# Patient Record
Sex: Female | Born: 1965 | Race: White | Hispanic: No | Smoking: Never smoker
Health system: Southern US, Community
[De-identification: ages and names within clinical notes are randomized; demographics above are authoritative.]

## PROBLEM LIST (undated history)

## (undated) DIAGNOSIS — Z803 Family history of malignant neoplasm of breast: Secondary | ICD-10-CM

## (undated) DIAGNOSIS — D649 Anemia, unspecified: Secondary | ICD-10-CM

## (undated) DIAGNOSIS — Z889 Allergy status to unspecified drugs, medicaments and biological substances status: Secondary | ICD-10-CM

## (undated) DIAGNOSIS — R55 Syncope and collapse: Secondary | ICD-10-CM

## (undated) DIAGNOSIS — K5900 Constipation, unspecified: Secondary | ICD-10-CM

## (undated) DIAGNOSIS — T753XXA Motion sickness, initial encounter: Secondary | ICD-10-CM

## (undated) DIAGNOSIS — J45909 Unspecified asthma, uncomplicated: Secondary | ICD-10-CM

## (undated) DIAGNOSIS — R12 Heartburn: Secondary | ICD-10-CM

## (undated) DIAGNOSIS — T8859XA Other complications of anesthesia, initial encounter: Secondary | ICD-10-CM

## (undated) DIAGNOSIS — R600 Localized edema: Secondary | ICD-10-CM

## (undated) DIAGNOSIS — C20 Malignant neoplasm of rectum: Secondary | ICD-10-CM

## (undated) DIAGNOSIS — T4145XA Adverse effect of unspecified anesthetic, initial encounter: Secondary | ICD-10-CM

## (undated) DIAGNOSIS — M1990 Unspecified osteoarthritis, unspecified site: Secondary | ICD-10-CM

## (undated) DIAGNOSIS — N949 Unspecified condition associated with female genital organs and menstrual cycle: Secondary | ICD-10-CM

## (undated) DIAGNOSIS — M549 Dorsalgia, unspecified: Secondary | ICD-10-CM

## (undated) DIAGNOSIS — R198 Other specified symptoms and signs involving the digestive system and abdomen: Secondary | ICD-10-CM

## (undated) DIAGNOSIS — M2550 Pain in unspecified joint: Secondary | ICD-10-CM

## (undated) DIAGNOSIS — R112 Nausea with vomiting, unspecified: Secondary | ICD-10-CM

## (undated) DIAGNOSIS — C189 Malignant neoplasm of colon, unspecified: Secondary | ICD-10-CM

## (undated) DIAGNOSIS — Z8049 Family history of malignant neoplasm of other genital organs: Secondary | ICD-10-CM

## (undated) DIAGNOSIS — R1310 Dysphagia, unspecified: Secondary | ICD-10-CM

## (undated) DIAGNOSIS — Z9889 Other specified postprocedural states: Secondary | ICD-10-CM

## (undated) DIAGNOSIS — R0602 Shortness of breath: Secondary | ICD-10-CM

## (undated) HISTORY — PX: TUBAL LIGATION: SHX77

## (undated) HISTORY — DX: Dysphagia, unspecified: R13.10

## (undated) HISTORY — PX: DILATION AND CURETTAGE OF UTERUS: SHX78

## (undated) HISTORY — PX: DIAGNOSTIC LAPAROSCOPY: SUR761

## (undated) HISTORY — PX: ABDOMINAL HYSTERECTOMY: SHX81

## (undated) HISTORY — DX: Localized edema: R60.0

## (undated) HISTORY — DX: Constipation, unspecified: K59.00

## (undated) HISTORY — DX: Malignant neoplasm of rectum: C20

## (undated) HISTORY — DX: Family history of malignant neoplasm of other genital organs: Z80.49

## (undated) HISTORY — DX: Shortness of breath: R06.02

## (undated) HISTORY — DX: Pain in unspecified joint: M25.50

## (undated) HISTORY — DX: Dorsalgia, unspecified: M54.9

## (undated) HISTORY — PX: COLON SURGERY: SHX602

## (undated) HISTORY — DX: Unspecified asthma, uncomplicated: J45.909

## (undated) HISTORY — DX: Malignant neoplasm of colon, unspecified: C18.9

## (undated) HISTORY — DX: Family history of malignant neoplasm of breast: Z80.3

---

## 1998-11-06 ENCOUNTER — Other Ambulatory Visit: Admission: RE | Admit: 1998-11-06 | Discharge: 1998-11-06 | Payer: Self-pay | Admitting: Obstetrics & Gynecology

## 1999-07-03 ENCOUNTER — Ambulatory Visit (HOSPITAL_COMMUNITY): Admission: RE | Admit: 1999-07-03 | Discharge: 1999-07-03 | Payer: Self-pay | Admitting: Obstetrics & Gynecology

## 1999-07-03 ENCOUNTER — Encounter (INDEPENDENT_AMBULATORY_CARE_PROVIDER_SITE_OTHER): Payer: Self-pay

## 1999-08-31 ENCOUNTER — Other Ambulatory Visit: Admission: RE | Admit: 1999-08-31 | Discharge: 1999-08-31 | Payer: Self-pay | Admitting: Obstetrics & Gynecology

## 1999-10-12 ENCOUNTER — Other Ambulatory Visit: Admission: RE | Admit: 1999-10-12 | Discharge: 1999-10-12 | Payer: Self-pay | Admitting: Obstetrics & Gynecology

## 1999-10-21 ENCOUNTER — Encounter (INDEPENDENT_AMBULATORY_CARE_PROVIDER_SITE_OTHER): Payer: Self-pay

## 1999-10-21 ENCOUNTER — Other Ambulatory Visit: Admission: RE | Admit: 1999-10-21 | Discharge: 1999-10-21 | Payer: Self-pay | Admitting: Obstetrics & Gynecology

## 2000-03-30 ENCOUNTER — Other Ambulatory Visit: Admission: RE | Admit: 2000-03-30 | Discharge: 2000-03-30 | Payer: Self-pay | Admitting: Obstetrics & Gynecology

## 2000-12-25 ENCOUNTER — Other Ambulatory Visit: Admission: RE | Admit: 2000-12-25 | Discharge: 2000-12-25 | Payer: Self-pay | Admitting: Obstetrics & Gynecology

## 2001-01-24 ENCOUNTER — Inpatient Hospital Stay (HOSPITAL_COMMUNITY): Admission: RE | Admit: 2001-01-24 | Discharge: 2001-01-26 | Payer: Self-pay | Admitting: Obstetrics & Gynecology

## 2001-01-24 ENCOUNTER — Encounter (INDEPENDENT_AMBULATORY_CARE_PROVIDER_SITE_OTHER): Payer: Self-pay | Admitting: Specialist

## 2002-08-16 ENCOUNTER — Other Ambulatory Visit: Admission: RE | Admit: 2002-08-16 | Discharge: 2002-08-16 | Payer: Self-pay | Admitting: Obstetrics & Gynecology

## 2003-09-08 ENCOUNTER — Other Ambulatory Visit: Admission: RE | Admit: 2003-09-08 | Discharge: 2003-09-08 | Payer: Self-pay | Admitting: Obstetrics & Gynecology

## 2004-10-29 ENCOUNTER — Other Ambulatory Visit: Admission: RE | Admit: 2004-10-29 | Discharge: 2004-10-29 | Payer: Self-pay | Admitting: Obstetrics & Gynecology

## 2006-05-24 ENCOUNTER — Encounter: Admission: RE | Admit: 2006-05-24 | Discharge: 2006-05-24 | Payer: Self-pay | Admitting: Internal Medicine

## 2007-01-28 ENCOUNTER — Emergency Department (HOSPITAL_COMMUNITY): Admission: EM | Admit: 2007-01-28 | Discharge: 2007-01-28 | Payer: Self-pay | Admitting: *Deleted

## 2010-08-24 ENCOUNTER — Encounter
Admission: RE | Admit: 2010-08-24 | Discharge: 2010-08-24 | Payer: Self-pay | Source: Home / Self Care | Attending: Geriatric Medicine | Admitting: Geriatric Medicine

## 2010-12-31 NOTE — Op Note (Signed)
[facility]  Patient:    Kaitlyn Morgan, Kaitlyn Morgan                     MRN: [id number] Proc. Date: 01/24/01 Adm. Date:  [id number] Attending:  Minette Headland                           Operative Report  PREOPERATIVE DIAGNOSIS:   Adenocarcinoma in situ of the endocervix.  POSTOPERATIVE DIAGNOSIS:  Adenocarcinoma in situ of the endocervix.  OPERATION:  Total vaginal hysterectomy.  SURGEON:  Christy B. Jennette Kettle, M.D.  ASSISTANT:  Marcelle Overlie, M.D.  ANESTHESIA:  General endotracheal.  ESTIMATED BLOOD LOSS:   100 cc.  INTRAOPERATIVE COMPLICATIONS:  None.  INDICATIONS:  The patient was admitted on the morning of surgery.  She was given 1 g of Cefotan IV.  She was placed in PAS hose.  She was brought to the operating room and placed under adequate general endotracheal anesthesia and placed in the dorsal lithotomy position using the Rowena stirrups system. Betadine prep of the mons, perineum and vagina was carried out in the usual fashion.  Sterile drapes were applied.  A posterior weighted vaginal retractor was placed.  Then the retractor was used to retract the vaginal walls and laterally.  The cervix was visualized and grasped with a Jacobs tenaculum. Colpotomy incision was made while tenting the mucosa of the vagina posterior to the cervix.  The cervix was circumscribed with a scalpel.  Curved Heaneys were placed on the uterosacrals which were divided sharply and ligated with 0 Monocryl in a Heaney fashion.  The cervix was circumscribed with the scalpel.  Curved Heaneys were placed on the uterosacrals which were divided sharply and ligated with 0 Monocryl in a Heaney fashion.  The bladder was advanced off of the cervix and lower uterine segment and the peritoneum anteriorly entered.  Cardinal ligament pedicles, vessel pedicles and one additional pedicle above the vessels on each side were then developed sequentially.  Each was divided and ligated with  suture ligature of 0 Monocryl.  The uterus was delivered through the vaginal introitus.  Curved Heaneys were placed on the utero-ovarian pedicles on each side and the tuerus removed.  The Filshie clips were then applied to the tubes and were also removed.  Each of these pedicles was doubly ligated with a free tie of 0 Monocryl followed by a suture ligature of 0 Monocryl.  Hemostasis was noted to be adequate.  Uterosacrals were anchored to the vaginal mucosa with a mattress suture of 0 Monocryl.  The uterosacrals were plicated and the posterior peritoneum closed with interrupted 0 Monocryl.  Cuff was closed vertically with figure-of-eight of 0 Monocryl.  Foley catheter was placed. Clear urine was obtained.  The procedure was terminated.  The patient was taken to recovery in good condition. DD:  01/24/01 TD:  01/24/01 Job: [id number]

## 2010-12-31 NOTE — Op Note (Signed)
[facility]  Patient:    Kaitlyn Morgan                      MRN: [id number] Proc. Date: 09/19/96 Adm. Date:  09/19/96 Attending:  Minette Headland                           Operative Report  PREOPERATIVE DIAGNOSIS:       Wound separation.  POSTOPERATIVE DIAGNOSIS:      Wound separation.  OPERATIVE PROCEDURE:          Secondary closure of lower abdominal transverse incision.  SURGEON:                      Freddy Finner, M.D.  ANESTHESIA:                   General.  INTRAOPERATIVE COMPLICATIONS: None.  INDICATIONS:                  The patient is a 45 year old who was delivered by cesarean section on September 09, 1995 by Trevor Iha, M.D.  Postoperatively, she had a wound seroma which subsequently separated and has been treated with twice-daily wound care by the Home Health nurse.  She was seen in the office on the day prior to this procedure and at that time, the wound was very clean.  The patient was having a lot of difficulty tolerating her wound care.  It was not felt that she could tolerate a surgical procedure in the office for suturing and, therefore, she was scheduled today for that procedure.  DESCRIPTION OF PROCEDURE:     She was admitted on the morning of surgery.  She was brought to the operating room, placed under adequate general anesthesia and placed in a dorsal recumbent position.  The lower abdomen was prepped in the usual fashion with scrub followed by solution.  The packing was removed from the wound and the skin staples were removed which were remaining as well as the Steri-Strips.  Sterile drapes were applied.  Using a #1 Prolene suture material, a total of approximately six mattress sutures were placed to approximate the wound both superficially and at a deeper level.  The fascia was intact.                                After completing the suturing of the wound, the patient was awakened and taken to  recovery in good condition and will be discharged for followup in the office in approximately three days.                                She is to take Ceftin 250 mg b.i.d. She was given a gram of Ceftin preoperatively.  She is to call for fever, for heavy drainage from the wound or for heavy bleeding from the wound. DD:  09/19/96 TD:  09/19/96 Job: [id number]

## 2010-12-31 NOTE — Discharge Summary (Signed)
[facility]  Patient:    Kaitlyn Morgan, Kaitlyn Morgan                     MRN: [id number] Adm. Date:  [id number] Disc. Date: 01/26/01 Attending:  Minette Headland                           Discharge Summary  DISCHARGE DIAGNOSIS:  Adenocarcinoma in situ of the endocervix.  PROCEDURE:  Total vaginal hysterectomy.  POSTOPERATIVE COMPLICATIONS:  Mid chest pain probably secondary to GERD picture or air within the abdomen secondary to surgery or anxiety.  No other complications.  CONDITION ON DISCHARGE:  The patient was in satisfactory improved condition at the time of her discharge.  ACTIVITY:  She was to aggressively increase physical activity.  Avoid vaginal entry or heavy lifting.  DISCHARGE MEDICATIONS: 1. Percocet 5 mg as needed for postoperative pain. 2. Xanax 0.5 mg t.i.d. p.r.n.  FOLLOWUP:  Follow up with Dr. Jennette Kettle in approximately two weeks.  SPECIAL INSTRUCTIONS:  She is to call for fever, severe pain or heavy vaginal bleeding.  DIET:  Regular diet with vitamin supplement.  HISTORY OF PRESENT ILLNESS:  The patient had an abnormal Pap smear with close followup over a period of one year that failed to identify the abnormal cells which were being picked up on pathology.  She was admitted this time for vaginal hysterectomy.  LABORATORY DATA AND X-RAY FINDINGS:  Preoperative hemoglobin of 13.1, postoperative hemoglobin 10.1.  HOSPITAL COURSE:  The patient was admitted on the morning of surgery.  The above described procedure was accomplished without difficulty or significant intraoperative complications.  Postoperatively, her only complaint was of difficulty taking a deep breath with pressure and pain in her mid chest on deep inspiration.  Her examination of heart and lungs was completely normal. Her O2 saturation on room air was 98%.  Vital signs remained stable.  She was afebrile.  With Xanax 0.5 mg t.i.d., she did have some improvement.  By  the morning of discharge on postop day #2, her condition was considered to be satisfactory. DD:  01/26/01 TD:  01/26/01 Job: [id number]

## 2010-12-31 NOTE — Op Note (Signed)
[facility]  Patient:    Kaitlyn Morgan                      MRN: [id number] Proc. Date: 07/03/99 Adm. Date:  [id number] Attending:  Minette Headland                           Operative Report  PREOPERATIVE DIAGNOSIS:       Threatened abortion.  Possible ectopic.  Hemorrhagic adnexal cyst.  Multiparity and requests for surgical sterilization.  INDICATIONS:                  The patient is a 45 year old gravida 7 with three living children who presented to the office with some vaginal bleeding and a positive serum pregnancy test in spite of what she thought was a normal period starting on November 15. Quantitative hCG was 954.  There was no identifiable gestation sac within the uterus.  The patient has definitively decided not to have more children and, with the presence of the bleeding and the potential for ectopic, she is now admitted for laparoscopy and D&C.  Intraoperative findings were most consistent with a normal intrauterine pregnancy and missed abortion and a hemorrhagic corpus luteum cyst of the right ovary.  Photographs of intraoperative findings show are retained in the office record.  DESCRIPTION OF PROCEDURE:     The patient was admitted on the morning of surgery, brought to the operating room and placed under adequate general endotracheal anesthesia in the dorsal lithotomy position in the H. Rivera Colen stirrups.  Betadine prep and draping of the peritoneum and vagina was carried out.  The bladder was evacuated with a Robinson catheter.  The cervix was visualized using a bivalved speculum.  The cervix was grasped with a single toothed tenaculum and progressively dilated with Pratts to #25.  General thorough curettage and exploration with polyp forceps was carried out, producing a moderate amount of tissue.  A Hulka tenaculum was then placed into the cervix.  Sterile drapes were applied.  Two small incisions were made in the abdomen, one  just above the symphysis in the midline and one at the umbilicus.   Through the umbilical incision, a 10 mm trocar was introduced while elevating the anterior abdominal wall manually.  Direct inspection revealed adequate placement and no evidence of injury on entry. Pneumoperitoneum was allow to accumulate with carbon dioxide gas. A 5 mm trocar was placed through the lower incision under direct vision and a blunt probe placed through the lower trocar sleeve. Examination of the pelvic contents included what appeared to be completely normal fallopian tubes, a markedly cystic right ovary which was ruptured with the blunt probe, producing serosanguineous material, but no active bleeding.  This was very consistent with a hemorrhagic corpus luteum.  The left ovary was normal.  The appendix could not be visualized.  No apparent abnormalities were noted in the upper abdomen.  Using the Filshie clip applying device, a Filshie clip was applied to the isthmic portion of each fallopian tube without difficulty.  All of the fluid that leaked from the cyst was aspirated, and amounted to approximately 100 cc. Hemostasis was adequate at this point.  Gas was allowed to escape from the abdomen.  Instruments were removed.  The skin incisions were closed with interrupted subcuticular sutures of 0 Dexon. Steri-Strips were applied to the lower incision.  THen, 0.5% Marcaine was injected into the incisions for  postoperative analgesia.  The patient was awakened and taken to recovery in good condition. DD:  07/03/99 TD:  07/04/99 Job: [id number]

## 2011-06-01 LAB — MONONUCLEOSIS SCREEN: Mono Screen: NEGATIVE

## 2011-06-01 LAB — RAPID STREP SCREEN (MED CTR MEBANE ONLY): Streptococcus, Group A Screen (Direct): NEGATIVE

## 2011-11-29 ENCOUNTER — Ambulatory Visit: Payer: 59

## 2011-11-29 ENCOUNTER — Ambulatory Visit (INDEPENDENT_AMBULATORY_CARE_PROVIDER_SITE_OTHER): Payer: 59 | Admitting: Family Medicine

## 2011-11-29 VITALS — BP 106/69 | HR 74 | Temp 99.0°F | Resp 16 | Ht 63.0 in | Wt 164.0 lb

## 2011-11-29 DIAGNOSIS — S91309A Unspecified open wound, unspecified foot, initial encounter: Secondary | ICD-10-CM

## 2011-11-29 DIAGNOSIS — S91339A Puncture wound without foreign body, unspecified foot, initial encounter: Secondary | ICD-10-CM

## 2011-11-29 MED ORDER — CEPHALEXIN 500 MG PO CAPS: 500.0000 mg | ORAL_CAPSULE | Freq: Two times a day (BID) | ORAL | Status: DC

## 2011-11-29 MED ORDER — AMOXICILLIN 500 MG PO CAPS: 500.0000 mg | ORAL_CAPSULE | Freq: Two times a day (BID) | ORAL | Status: AC

## 2011-11-29 NOTE — Progress Notes (Signed)
  Subjective:    Patient ID: Kaitlyn Morgan, female    DOB: 1966/03/13, 46 y.o.   MRN: [id number]  HPI Puncture heel on a  Metal wire rack this morning.  Now having pain with weightbearing.  No paresthesias. It continues to bleed. Her tdap was updated in 2012.  Review of Systems  Constitutional: Negative.   Neurological: Negative.        Objective:   Physical Exam  Constitutional: She is oriented to person, place, and time. She appears well-developed and well-nourished.  Cardiovascular: Intact distal pulses.   Pulmonary/Chest: Effort normal and breath sounds normal.  Musculoskeletal: She exhibits tenderness.       Right plantar aspect of her heel. Nonbleeding 1-2 mm puncture wound with surrounding mild erythema.  Neurological: She is alert and oriented to person, place, and time.  Skin: Skin is warm and dry. There is erythema.  Psychiatric: She has a normal mood and affect. Her behavior is normal.      [facility] reading (PRIMARY) by  Dr. Althea Charon 2 view xray of the right heel  without evidence of a foreign body.     Assessment & Plan:   1. Puncture wound of heel without complication  DG Os Calcis Right, amoxicillin (AMOXIL) 500 MG capsule  Amoxil given for 7 days. See AVS for pt instructions. OTC analgesics for pain control.

## 2011-11-29 NOTE — Patient Instructions (Addendum)
Puncture Wound  A puncture wound is an injury that extends through all layers of the skin and into the tissue beneath the skin (subcutaneous tissue). Puncture wounds become infected easily because germs often enter the body and go beneath the skin during the injury. Having a deep wound with a small entrance point makes it difficult for your caregiver to adequately clean the wound. This is especially true if you have stepped on a nail and it has passed through a dirty shoe or other situations where the wound is obviously contaminated.  CAUSES   Many puncture wounds involve glass, nails, splinters, fish hooks, or other objects that enter the skin (foreign bodies). A puncture wound may also be caused by a human bite or animal bite.  DIAGNOSIS   A puncture wound is usually diagnosed by your history and a physical exam. You may need to have an X-ray or an ultrasound to check for any foreign bodies still in the wound.  TREATMENT    Your caregiver will clean the wound as thoroughly as possible. Depending on the location of the wound, a bandage (dressing) may be applied.   Your caregiver might prescribe antibiotic medicines.   You may need a follow-up visit to check on your wound. Follow all instructions as directed by your caregiver.  HOME CARE INSTRUCTIONS    Change your dressing once per day, or as directed by your caregiver. If the dressing sticks, it may be removed by soaking the area in water.   If your caregiver has given you follow-up instructions, it is very important that you return for a follow-up appointment. Not following up as directed could result in a chronic or permanent injury, pain, and disability.   Only take over-the-counter or prescription medicines for pain, discomfort, or fever as directed by your caregiver.   If you are given antibiotics, take them as directed. Finish them even if you start to feel better.  You may need a tetanus shot if:   You cannot remember when you had your last tetanus  shot.   You have never had a tetanus shot.  If you got a tetanus shot, your arm may swell, get red, and feel warm to the touch. This is common and not a problem. If you need a tetanus shot and you choose not to have one, there is a rare chance of getting tetanus. Sickness from tetanus can be serious.  You may need a rabies shot if an animal bite caused your puncture wound.  SEEK MEDICAL CARE IF:    You have redness, swelling, or increasing pain in the wound.   You have red streaks going away from the wound.   You notice a bad smell coming from the wound or dressing.   You have yellowish-white fluid (pus) coming from the wound.   You are treated with an antibiotic for infection, but the infection is not getting better.   You notice something in the wound, such as rubber from your shoe, cloth, or another object.   You have a fever.   You have severe pain.   You have difficulty breathing.   You feel dizzy or faint.   You cannot stop vomiting.   You lose feeling, develop numbness, or cannot move a limb below the wound.   Your symptoms worsen.  MAKE SURE YOU:   Understand these instructions.   Will watch your condition.   Will get help right away if you are not doing well or get worse.    Document Released: 05/11/2005 Document Revised: 07/21/2011 Document Reviewed: 01/18/2011  [employer] Patient Information 2012 [employer].

## 2011-12-01 ENCOUNTER — Telehealth: Payer: Self-pay

## 2011-12-01 NOTE — Telephone Encounter (Signed)
Please write note for parking. Please check on stomach cramps.  Kaitlyn Morgan

## 2011-12-01 NOTE — Telephone Encounter (Signed)
Please advise on this.  

## 2011-12-01 NOTE — Telephone Encounter (Signed)
Pt was seen in office for heel injury pt needs note faxed to employer stating her injury and her need to park closer to building please fax note to [phone] Attention: Sercurity

## 2011-12-01 NOTE — Telephone Encounter (Signed)
Patient states she was given samples of amoxicillin and she is having severe stomach pains. States she already has a sensitive stomach and can eat limited foods. Please call patient to let her know what she should do.

## 2011-12-02 NOTE — Telephone Encounter (Signed)
Patient states that the antibiotic was hurting her stomach and wanted to know if she could just stop taking it.  Also stated that she didn't need the note anymore.

## 2011-12-03 NOTE — Telephone Encounter (Signed)
Spoke pt advised message from [place]. Pt understood

## 2011-12-03 NOTE — Telephone Encounter (Signed)
LMOM to CB. 

## 2011-12-03 NOTE — Telephone Encounter (Signed)
The antibiotic was prescribed because puncture wounds are notorious for developing infection.  If he is not taking the Amoxicillin with food, please advise him to.  If the GI upset is mild/tolerable, advise he continue it.  If he is taking it with food, and it is intolerable, he may D/C it, but should RTC at the first indication of infection: increasing pain, redness, swelling, recurrent bleeding, drainage from the wound, fever.

## 2013-06-26 ENCOUNTER — Other Ambulatory Visit: Payer: Self-pay | Admitting: Gastroenterology

## 2013-06-26 DIAGNOSIS — K5900 Constipation, unspecified: Secondary | ICD-10-CM

## 2013-06-26 DIAGNOSIS — R141 Gas pain: Secondary | ICD-10-CM

## 2013-07-09 ENCOUNTER — Ambulatory Visit
Admission: RE | Admit: 2013-07-09 | Discharge: 2013-07-09 | Disposition: A | Discharge status: Home / Self Care | Payer: 59 | Source: Ambulatory Visit | Attending: Gastroenterology | Admitting: Gastroenterology

## 2013-07-09 DIAGNOSIS — R141 Gas pain: Secondary | ICD-10-CM

## 2013-07-09 DIAGNOSIS — K5900 Constipation, unspecified: Secondary | ICD-10-CM

## 2015-07-04 ENCOUNTER — Ambulatory Visit (INDEPENDENT_AMBULATORY_CARE_PROVIDER_SITE_OTHER): Payer: 59 | Admitting: Internal Medicine

## 2015-07-04 VITALS — BP 118/80 | HR 70 | Temp 98.5°F | Resp 16 | Ht 62.0 in | Wt 165.6 lb

## 2015-07-04 DIAGNOSIS — J302 Other seasonal allergic rhinitis: Secondary | ICD-10-CM | POA: Diagnosis not present

## 2015-07-04 DIAGNOSIS — J0100 Acute maxillary sinusitis, unspecified: Secondary | ICD-10-CM

## 2015-07-04 DIAGNOSIS — H6502 Acute serous otitis media, left ear: Secondary | ICD-10-CM | POA: Diagnosis not present

## 2015-07-04 MED ORDER — AMOXICILLIN 500 MG PO CAPS: 1000.0000 mg | ORAL_CAPSULE | Freq: Two times a day (BID) | ORAL | Status: AC

## 2015-07-04 MED ORDER — FLUTICASONE PROPIONATE 50 MCG/ACT NA SUSP: NASAL | Status: DC

## 2015-07-04 NOTE — Progress Notes (Signed)
Subjective:    Patient ID: Kaitlyn Morgan, female    DOB: 29-Dec-1965, 49 y.o.   MRN: [id number] This chart was scribed for Tami Lin, MD by Marti Sleigh, Medical Scribe. This patient was seen in Room 4 and the patient's care was started a 8:57 AM.  Chief Complaint  Patient presents with  . Ear Pain    x 1 week  . Facial Pain    over and under eyes, x 1 week  . Generalized Body Aches    x 33month   . Nasal Congestion    x 1 month     HPI HPI Comments: Kaitlyn Morgan is a 49 y.o. female who presents to [facility] complaining of intermittent myalgias, and nasal congestion for the last month. She developed left ear and left facial pain one week ago. She states she put a q-tip at the outermost part of her ear several days ago and experienced severe pain. She denies wheezing. She is not allergic to penicillins. Denies fever or chills.   Review of Systems  Constitutional: Negative for fever and chills.  HENT: Positive for congestion and rhinorrhea.   Respiratory: Negative for cough and wheezing.   Musculoskeletal: Positive for myalgias.       Objective:   Physical Exam  Constitutional: She is oriented to person, place, and time. She appears well-developed and well-nourished. No distress.  HENT:  Head: Normocephalic and atraumatic.  Conjunctiva clear. Left TM is dull and inflamed. There is debris in the left canal. Right TM clear. Nares with purulent mucous, and tender maxillaries. No cervical nodes. Lungs clear.  Eyes: Conjunctivae are normal. Pupils are equal, round, and reactive to light.  Neck: Neck supple.  Cardiovascular: Normal rate, regular rhythm and normal heart sounds.   No murmur heard. Pulmonary/Chest: Effort normal. No respiratory distress. She has no wheezes.  Musculoskeletal: Normal range of motion.  Lymphadenopathy:    She has no cervical adenopathy.  Neurological: She is alert and oriented to person, place, and time. Coordination normal.  Skin: Skin is warm  and dry. She is not diaphoretic.  Psychiatric: She has a normal mood and affect. Her behavior is normal.  Nursing note and vitals reviewed. hearing intact BP 118/80 mmHg  Pulse 70  Temp(Src) 98.5 F (36.9 C) (Oral)  Resp 16  Ht 5\' 2"  (1.575 m)  Wt 165 lb 9.6 oz (75.116 kg)  BMI 30.28 kg/m2  SpO2 98%     Assessment & Plan:  Acute serous otitis media of left ear, recurrence not specified  Acute maxillary sinusitis, recurrence not specified  Other seasonal allergic rhinitis  Meds ordered this encounter  Medications  . Naproxen Sodium (ALEVE PO)    Sig: Take by mouth.  . Loratadine (CLARITIN PO)    Sig: Take by mouth.  Marland Kitchen amoxicillin (AMOXIL) 500 MG capsule    Sig: Take 2 capsules (1,000 mg total) by mouth 2 (two) times daily.    Dispense:  40 capsule    Refill:  0  . fluticasone (FLONASE) 50 MCG/ACT nasal spray    Sig: 1 spray each nostril twice a day    Dispense:  16 g    Refill:  6   Fu ear exam ?FB vs debris 10-20d I have completed the patient encounter in its entirety as documented by the scribe, with editing by me where necessary. Jehan Bonano P. Laney Pastor, M.D.  By signing my name below, I, Judithe Modest, attest that this documentation has been prepared under the direction and  in the presence of Tami Lin, MD. Electronically Signed: Judithe Modest, ER Scribe. 07/04/2015. 8:59 AM.

## 2015-10-03 ENCOUNTER — Ambulatory Visit (INDEPENDENT_AMBULATORY_CARE_PROVIDER_SITE_OTHER): Payer: 59 | Admitting: Internal Medicine

## 2015-10-03 VITALS — BP 110/80 | HR 72 | Temp 98.1°F | Resp 18 | Ht 62.0 in | Wt 171.0 lb

## 2015-10-03 DIAGNOSIS — R109 Unspecified abdominal pain: Secondary | ICD-10-CM | POA: Diagnosis not present

## 2015-10-03 DIAGNOSIS — A09 Infectious gastroenteritis and colitis, unspecified: Secondary | ICD-10-CM | POA: Diagnosis not present

## 2015-10-03 DIAGNOSIS — R350 Frequency of micturition: Secondary | ICD-10-CM

## 2015-10-03 DIAGNOSIS — J019 Acute sinusitis, unspecified: Secondary | ICD-10-CM | POA: Diagnosis not present

## 2015-10-03 DIAGNOSIS — M791 Myalgia, unspecified site: Secondary | ICD-10-CM

## 2015-10-03 DIAGNOSIS — R0981 Nasal congestion: Secondary | ICD-10-CM

## 2015-10-03 DIAGNOSIS — R197 Diarrhea, unspecified: Secondary | ICD-10-CM

## 2015-10-03 DIAGNOSIS — IMO0001 Reserved for inherently not codable concepts without codable children: Secondary | ICD-10-CM

## 2015-10-03 DIAGNOSIS — R05 Cough: Secondary | ICD-10-CM | POA: Diagnosis not present

## 2015-10-03 DIAGNOSIS — R059 Cough, unspecified: Secondary | ICD-10-CM

## 2015-10-03 LAB — POCT INFLUENZA A/B
Influenza A, POC: NEGATIVE
Influenza B, POC: NEGATIVE

## 2015-10-03 LAB — POCT CBC
Granulocyte percent: 68.4 %G (ref 37–80)
HCT, POC: 37.8 % (ref 37.7–47.9)
Hemoglobin: 12.9 g/dL (ref 12.2–16.2)
Lymph, poc: 1.5 (ref 0.6–3.4)
MCH, POC: 29.9 pg (ref 27–31.2)
MCHC: 34 g/dL (ref 31.8–35.4)
MCV: 87.8 fL (ref 80–97)
MID (cbc): 0.1 (ref 0–0.9)
MPV: 6.4 fL (ref 0–99.8)
POC Granulocyte: 3.4 (ref 2–6.9)
POC LYMPH PERCENT: 30.1 %L (ref 10–50)
POC MID %: 1.5 %M (ref 0–12)
Platelet Count, POC: 280 10*3/uL (ref 142–424)
RBC: 4.31 M/uL (ref 4.04–5.48)
RDW, POC: 14.4 %
WBC: 5 10*3/uL (ref 4.6–10.2)

## 2015-10-03 LAB — POCT URINALYSIS DIP (MANUAL ENTRY)
Bilirubin, UA: NEGATIVE
Blood, UA: NEGATIVE
Glucose, UA: NEGATIVE
Ketones, POC UA: NEGATIVE
Leukocytes, UA: NEGATIVE
Nitrite, UA: NEGATIVE
Protein Ur, POC: NEGATIVE
Spec Grav, UA: 1.015
Urobilinogen, UA: 0.2
pH, UA: 7

## 2015-10-03 LAB — POC MICROSCOPIC URINALYSIS (UMFC): Mucus: ABSENT

## 2015-10-03 MED ORDER — GUAIFENESIN ER 1200 MG PO TB12: 1.0000 | ORAL_TABLET | Freq: Two times a day (BID) | ORAL | Status: DC | PRN

## 2015-10-03 MED ORDER — AMOXICILLIN 875 MG PO TABS: 875.0000 mg | ORAL_TABLET | Freq: Two times a day (BID) | ORAL | Status: AC

## 2015-10-03 NOTE — Patient Instructions (Addendum)
Please try to take over the counter prilosec at this time. Please take the medication as prescribed.   Please inquire of the ingredients of both the prilosec and the mucinex Please hydrate well with 64 oz of water per day.     Sinusitis, Adult Sinusitis is redness, soreness, and inflammation of the paranasal sinuses. Paranasal sinuses are air pockets within the bones of your face. They are located beneath your eyes, in the middle of your forehead, and above your eyes. In healthy paranasal sinuses, mucus is able to drain out, and air is able to circulate through them by way of your nose. However, when your paranasal sinuses are inflamed, mucus and air can become trapped. This can allow bacteria and other germs to grow and cause infection. Sinusitis can develop quickly and last only a short time (acute) or continue over a long period (chronic). Sinusitis that lasts for more than 12 weeks is considered chronic. CAUSES Causes of sinusitis include:  Allergies.  Structural abnormalities, such as displacement of the cartilage that separates your nostrils (deviated septum), which can decrease the air flow through your nose and sinuses and affect sinus drainage.  Functional abnormalities, such as when the small hairs (cilia) that line your sinuses and help remove mucus do not work properly or are not present. SIGNS AND SYMPTOMS Symptoms of acute and chronic sinusitis are the same. The primary symptoms are pain and pressure around the affected sinuses. Other symptoms include:  Upper toothache.  Earache.  Headache.  Bad breath.  Decreased sense of smell and taste.  A cough, which worsens when you are lying flat.  Fatigue.  Fever.  Thick drainage from your nose, which often is green and may contain pus (purulent).  Swelling and warmth over the affected sinuses. DIAGNOSIS Your health care provider will perform a physical exam. During your exam, your health care provider may perform any of  the following to help determine if you have acute sinusitis or chronic sinusitis:  Look in your nose for signs of abnormal growths in your nostrils (nasal polyps).  Tap over the affected sinus to check for signs of infection.  View the inside of your sinuses using an imaging device that has a light attached (endoscope). If your health care provider suspects that you have chronic sinusitis, one or more of the following tests may be recommended:  Allergy tests.  Nasal culture. A sample of mucus is taken from your nose, sent to a lab, and screened for bacteria.  Nasal cytology. A sample of mucus is taken from your nose and examined by your health care provider to determine if your sinusitis is related to an allergy. TREATMENT Most cases of acute sinusitis are related to a viral infection and will resolve on their own within 10 days. Sometimes, medicines are prescribed to help relieve symptoms of both acute and chronic sinusitis. These may include pain medicines, decongestants, nasal steroid sprays, or saline sprays. However, for sinusitis related to a bacterial infection, your health care provider will prescribe antibiotic medicines. These are medicines that will help kill the bacteria causing the infection. Rarely, sinusitis is caused by a fungal infection. In these cases, your health care provider will prescribe antifungal medicine. For some cases of chronic sinusitis, surgery is needed. Generally, these are cases in which sinusitis recurs more than 3 times per year, despite other treatments. HOME CARE INSTRUCTIONS  Drink plenty of water. Water helps thin the mucus so your sinuses can drain more easily.  Use a humidifier.  Inhale steam 3-4 times a day (for example, sit in the bathroom with the shower running).  Apply a warm, moist washcloth to your face 3-4 times a day, or as directed by your health care provider.  Use saline nasal sprays to help moisten and clean your sinuses.  Take  medicines only as directed by your health care provider.  If you were prescribed either an antibiotic or antifungal medicine, finish it all even if you start to feel better. SEEK IMMEDIATE MEDICAL CARE IF:  You have increasing pain or severe headaches.  You have nausea, vomiting, or drowsiness.  You have swelling around your face.  You have vision problems.  You have a stiff neck.  You have difficulty breathing.   This information is not intended to replace advice given to you by your health care provider. Make sure you discuss any questions you have with your health care provider.   Document Released: 08/01/2005 Document Revised: 08/22/2014 Document Reviewed: 08/16/2011 Elsevier Interactive Patient Education 2016 [place] oral ER tablets What is this medicine? GUAIFENESIN (gwye FEN e sin) is an expectorant. It helps to thin mucous and make coughs more productive. This medicine is used to treat coughs caused by colds or the flu. It is not intended to treat chronic cough caused by smoking, asthma, emphysema, or heart failure. This medicine may be used for other purposes; ask your health care provider or pharmacist if you have questions. What should I tell my health care provider before I take this medicine? They need to know if you have any of these conditions: -fever -kidney disease -an unusual or allergic reaction to guaifenesin, other medicines, foods, dyes, or preservatives -pregnant or trying to get pregnant -breast-feeding How should I use this medicine? Take this medicine by mouth with a full glass of water. Follow the directions on the prescription label. Do not break, chew or crush this medicine. You may take with food or on an empty stomach. Take your medicine at regular intervals. Do not take your medicine more often than directed. Talk to your pediatrician regarding the use of this medicine in children. While this drug may be prescribed for children as  young as 39 years old for selected conditions, precautions do apply. Overdosage: If you think you have taken too much of this medicine contact a poison control center or emergency room at once. NOTE: This medicine is only for you. Do not share this medicine with others. What if I miss a dose? If you miss a dose, take it as soon as you can. If it is almost time for your next dose, take only that dose. Do not take double or extra doses. What may interact with this medicine? Interactions are not expected. This list may not describe all possible interactions. Give your health care provider a list of all the medicines, herbs, non-prescription drugs, or dietary supplements you use. Also tell them if you smoke, drink alcohol, or use illegal drugs. Some items may interact with your medicine. What should I watch for while using this medicine? Do not treat a cough for more than 1 week without consulting your doctor or health care professional. If you also have a high fever, skin rash, continuing headache, or sore throat, see your doctor. For best results, drink 6 to 8 glasses water daily while you are taking this medicine. What side effects may I notice from receiving this medicine? Side effects that you should report to your doctor or health care professional as soon  as possible: -allergic reactions like skin rash, itching or hives, swelling of the face, lips, or tongue Side effects that usually do not require medical attention (report to your doctor or health care professional if they continue or are bothersome): -dizziness -headache -stomach upset This list may not describe all possible side effects. Call your doctor for medical advice about side effects. You may report side effects to FDA at 1-800-FDA-1088. Where should I keep my medicine? Keep out of the reach of children. Store at room temperature between 20 and 25 degrees C (68 and 77 degrees F). Keep container tightly closed. Throw away any unused  medicine after the expiration date. NOTE: This sheet is a summary. It may not cover all possible information. If you have questions about this medicine, talk to your doctor, pharmacist, or health care provider.    2016, Elsevier/Gold Standard. (2007-12-12 12:14:14)

## 2015-10-03 NOTE — Progress Notes (Signed)
[facility] [address], [place] [address] [phone]  Date:  10/03/2015   Name:  Kaitlyn Morgan   DOB:  [birth year]   MRN:  [id number]  PCP:  No primary care provider on file.    History of Present Illness:  Kaitlyn Morgan is a 50 y.o. female patient who presents to [facility] for chief complaint of rhinorrhea.      2 weeks beginning with cold symptoms of nasal congestion, sore throat and cough.  1 week ago, she has had Bloody diarrhea and generalized abdominal pain.   To note, she has had bloody stools chronically.  This is followed by Drr. Medoff with abdominal pain, and nausea.  There is some thought that she has IBD, however she states that she has urged her to proceed with colonoscopy, but she has been reluctant.  Patient has had a hx of GI issues since childhood.  She has taken in clear liquids for the last week, which have helped.  The bloody diarrhea has resolved.  She then had black stool, but this has resolved as well.  No hx of heavy nsaid or etOH use.  Attempted to eat but had nausea.  5 days ago started having rhinorrhea.  Attempted to take flonase.  Last night, started feeling weak, and shortness of breath and pain in back with deep inspiration.  This has since resolved.  At this time, she has very low energy.  Cough is dry.  She has sinus pain around her frontal sinus and around eyes.  There are no active problems to display for this patient.   No past medical history on file.  Past Surgical History  Procedure Laterality Date  . Cesarean section    . Abdominal hysterectomy    . Tubal ligation      Social History  Substance Use Topics  . Smoking status: Never Smoker   . Smokeless tobacco: Never Used  . Alcohol Use: No    No family history on file.  Allergies  Allergen Reactions  . Morphine And Related Itching  . Zolpidem Tartrate     Hallucinations     Medication list has been reviewed and updated.  Current Outpatient  Prescriptions on File Prior to Visit  Medication Sig Dispense Refill  . fluticasone (FLONASE) 50 MCG/ACT nasal spray 1 spray each nostril twice a day 16 g 6  . Loratadine (CLARITIN PO) Take by mouth. Reported on 10/03/2015    . Naproxen Sodium (ALEVE PO) Take by mouth. Reported on 10/03/2015     No current facility-administered medications on file prior to visit.    ROS ROS otherwise unremarkabe unless listed above.  Physical Examination: BP 110/80 mmHg  Pulse 72  Temp(Src) 98.1 F (36.7 C) (Oral)  Resp 18  Ht 5\' 2"  (1.575 m)  Wt 171 lb (77.565 kg)  BMI 31.27 kg/m2  SpO2 98% Ideal Body Weight: Weight in (lb) to have BMI = 25: 136.4  Physical Exam  Constitutional: She is oriented to person, place, and time. She appears well-developed and well-nourished. No distress.  HENT:  Head: Normocephalic and atraumatic.  Right Ear: Tympanic membrane, external ear and ear canal normal.  Left Ear: Tympanic membrane, external ear and ear canal normal.  Nose: Mucosal edema and rhinorrhea present. Right sinus exhibits frontal sinus tenderness. Right sinus exhibits no maxillary sinus tenderness. Left sinus exhibits frontal sinus tenderness. Left sinus exhibits no maxillary sinus tenderness.  Mouth/Throat: No uvula swelling. No oropharyngeal exudate, posterior oropharyngeal edema  or posterior oropharyngeal erythema.  Eyes: Conjunctivae and EOM are normal. Pupils are equal, round, and reactive to light.  Cardiovascular: Normal rate and regular rhythm.  Exam reveals no gallop, no distant heart sounds and no friction rub.   No murmur heard. Pulmonary/Chest: Effort normal. No respiratory distress. She has no decreased breath sounds. She has no wheezes. She has no rhonchi.  Lymphadenopathy:       Head (right side): No submandibular, no tonsillar, no preauricular and no posterior auricular adenopathy present.       Head (left side): No submandibular, no tonsillar, no preauricular and no posterior  auricular adenopathy present.  Neurological: She is alert and oriented to person, place, and time.  Skin: She is not diaphoretic.  Psychiatric: She has a normal mood and affect. Her behavior is normal.    Results for orders placed or performed in visit on 10/03/15  POCT CBC  Result Value Ref Range   WBC 5.0 4.6 - 10.2 K/uL   Lymph, poc 1.5 0.6 - 3.4   POC LYMPH PERCENT 30.1 10 - 50 %L   MID (cbc) 0.1 0 - 0.9   POC MID % 1.5 0 - 12 %M   POC Granulocyte 3.4 2 - 6.9   Granulocyte percent 68.4 37 - 80 %G   RBC 4.31 4.04 - 5.48 M/uL   Hemoglobin 12.9 12.2 - 16.2 g/dL   HCT, POC 37.8 37.7 - 47.9 %   MCV 87.8 80 - 97 fL   MCH, POC 29.9 27 - 31.2 pg   MCHC 34.0 31.8 - 35.4 g/dL   RDW, POC 14.4 %   Platelet Count, POC 280 142 - 424 K/uL   MPV 6.4 0 - 99.8 fL  POCT urinalysis dipstick  Result Value Ref Range   Color, UA  yellow   Clarity, UA  clear   Glucose, UA  negative   Bilirubin, UA  negative   Ketones, POC UA  negative   Spec Grav, UA     Blood, UA  negative   pH, UA     Protein Ur, POC  negative   Urobilinogen, UA     Nitrite, UA  Negative   Leukocytes, UA  Negative  POCT Microscopic Urinalysis ([facility])  Result Value Ref Range   WBC,UR,HPF,POC  None WBC/hpf   RBC,UR,HPF,POC  None RBC/hpf   Bacteria  None, Too numerous to count   Mucus  Absent   Epithelial Cells, UR Per Microscopy  None, Too numerous to count cells/hpf  POCT Influenza A/B  Result Value Ref Range   Influenza A, POC Negative Negative   Influenza B, POC Negative Negative     Assessment and Plan: Kaitlyn Morgan is a 50 y.o. female who is here today for sinus pain, congestion and cough. -bloody diarrhea appears resolved.  This is ongoing, and I have suggested that she see Dr. Earlean Shawl right away.  This appears to be possible ibd, celiac, etc.  I have encouraged her to proceed with colonoscopy/endoscopy as suggested by Dr. Earlean Shawl. -sinus infection appear separate from this GI issue.  Will treat with  amoxicillin.  Advised that she use a prilosec at this time, to protect for ulcer.   Subacute sinusitis, unspecified location - Plan: amoxicillin (AMOXIL) 875 MG tablet, Guaifenesin (MUCINEX MAXIMUM STRENGTH) 1200 MG TB12  Bloody diarrhea - Plan: POCT CBC, POCT urinalysis dipstick, POCT Microscopic Urinalysis ([facility])  Frequency - Plan: POCT CBC, POCT urinalysis dipstick, POCT Microscopic Urinalysis ([facility])  Myalgia - Plan: POCT CBC,  POCT Influenza A/B  Nasal congestion - Plan: POCT Influenza A/B  Abdominal pain, unspecified abdominal location - Plan: POCT Influenza A/B  Cough - Plan: POCT Influenza A/B  Ivar Drape, PA-C [facility] 2/20/20177:17 AM  I have participated in the care of this patient with the Advanced Practice Provider and agree with Diagnosis and Plan as documented. Robert P. Laney Pastor, M.D.

## 2015-12-07 ENCOUNTER — Ambulatory Visit: Payer: Self-pay | Admitting: General Surgery

## 2015-12-07 NOTE — H&P (Signed)
Tyrisha M. Boleyn 12/07/2015 11:59 AM Location: [facility] Patient #: [id number] DOB: 1966/01/16 Separated / Language: Cleophus Molt / Race: White Female  History of Present Illness Odis Hollingshead MD; 12/07/2015 5:35 PM) The patient is a 50 year old female.   Note:She is referred by Dr. Earlean Shawl for consultation due to newly diagnosed proximal rectal cancer that is partially obstructing by way of colonoscopy. She had some hematochezia that she states started about 2 months ago. She presented to Dr. Earlean Shawl at that time. She also had some fecal urgency. He performed a colonoscopy which demonstrated a friable partially obstructing lesion 15 cm from the anal verge. 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 She does have some intermittent cramping distention and excess gas along with nausea after eating sometimes. No vomiting. She has had some weight loss and loss of appetite. She is here with her daughter.  Allergies Shyrl Numbers, LPN; 075-GRM [id number] PM) Ambien *HYPNOTICS/SEDATIVES/SLEEP DISORDER AGENTS*  Medication History Shyrl Numbers, LPN; 075-GRM [id number] PM) No Current Medications Medications Reconciled    Vitals Joelene Millin F. Turpin LPN; 075-GRM [id number] PM) 12/07/2015 12:00 PM Weight: 158.8 lb Height: 62in Body Surface Area: 1.73 m Body Mass Index: 29.04 kg/m  Temp.: 98.59F(Oral)  Pulse: 70 (Regular)  BP: 126/86 (Sitting, Left Arm, Standard)      Physical Exam Odis Hollingshead MD; 12/07/2015 5:37 PM)  The physical exam findings are as follows: Note:General: Overweight female in NAD. Pleasant and cooperative.  HEENT: [place]/AT, no facial masses  EYES: EOMI, no icterus  NECK: Supple, no obvious mass.  CV: RRR, no murmur, no JVD.  CHEST: Breath sounds equal and clear. Respirations nonlabored.  ABDOMEN: Soft, nontender, nondistended, no masses, no organomegaly, no hernias.  ANORECTAL: No fissures. Normal sphincter tone. No  masses. No blood on finger.  MUSCULOSKELETAL: FROM, good muscle tone, no edema, no venous stasis changes  LYMPHATIC: No palpable supraclavicular or inguinal adenopathy.  SKIN: No jaundice or suspicious rashes.  NEUROLOGIC: Alert and oriented, answers questions appropriately, normal gait and station.  PSYCHIATRIC: Normal mood, affect , and behavior.    Assessment & Plan Odis Hollingshead MD; 12/07/2015 5:44 PM)  RECTAL CANCER (C20) Impression: This appears to be in the proximal rectum. Lesion is partially obstructing and did not allow for a full colonoscopy to be done. She is not showing signs of obstruction type symptoms at this time.  Plan: Low fiber diet. CMET, CBC, CEA level. CT of chest, abdomen, pelvis. Referral to medical oncology. We talked about laparoscopic assisted partial colectomy, possible colostomy. I have explained the procedure and risks of colon resection. Risks include but are not limited to bleeding, infection, wound problems, anesthesia, anastomotic leak, need for colostomy, need for reoperative surgery, injury to intraabominal organs (such as intestine, spleen, kidney, bladder, ureter, etc.), ileus, irregular bowel habits. She has been seen by medical oncology, we will decide if we should just proceed to surgery or start neoadjuvant treatment. She will need a prescription for preop antibiotics for her bowel prep if we proceed with surgery first.  Current Plans Follow up as needed Referred to Oncology, for evaluation and follow up (Oncology). Routine. Pt Education - [facility] Free Text Education/Instructions: discussed with patient and provided information. Pt Education - [facility] Colon Bowel Prep 2015 Miralax/Antibiotics CBC, PLATELETS & AUT DIFF ([id number]) METABOLIC PANEL, COMPREHENSIVE [id number]) CARCINOEMBRYONIC ANTIGEN (CEA) [id number])  Jackolyn Confer, MD

## 2015-12-08 ENCOUNTER — Other Ambulatory Visit: Payer: Self-pay | Admitting: General Surgery

## 2015-12-08 DIAGNOSIS — C20 Malignant neoplasm of rectum: Secondary | ICD-10-CM

## 2015-12-10 ENCOUNTER — Other Ambulatory Visit: Payer: Self-pay

## 2015-12-10 ENCOUNTER — Ambulatory Visit
Admission: RE | Admit: 2015-12-10 | Discharge: 2015-12-10 | Disposition: A | Discharge status: Home / Self Care | Payer: 59 | Source: Ambulatory Visit | Attending: General Surgery | Admitting: General Surgery

## 2015-12-10 ENCOUNTER — Telehealth: Payer: Self-pay | Admitting: *Deleted

## 2015-12-10 DIAGNOSIS — C20 Malignant neoplasm of rectum: Secondary | ICD-10-CM

## 2015-12-10 MED ORDER — IOPAMIDOL (ISOVUE-300) INJECTION 61%
100.0000 mL | Freq: Once | INTRAVENOUS | Status: AC | PRN
  Administered 2015-12-10: 100 mL via INTRAVENOUS

## 2015-12-10 NOTE — Telephone Encounter (Signed)
Oncology Nurse Navigator Documentation  Oncology Nurse Navigator Flowsheets 12/10/2015  Navigator Location [facility]  Navigator Encounter Type Introductory phone call  Abnormal Finding Date 12/03/2015  Confirmed Diagnosis Date 12/04/2015  Spoke with patient and provided new patient appointment for 12/18/15 in GI [facility]-arrival at [facility] to see Dr. Lisbeth Renshaw at 0800 and Dr. Burr Medico at 0900. Informed of location of [facility], valet service, and registration process. Reminded to bring insurance cards and a current medication list, including supplements. Patient verbalizes understanding. Notified Nikki in HIM and Cecil in rad onc to enter appointments into EPIC. Added to GI Cancer Conference discussion for 12/16/15. Called Dr. Liliane Channel office and obtained colonoscopy report and pathology.

## 2015-12-11 ENCOUNTER — Other Ambulatory Visit: Payer: Self-pay

## 2015-12-15 ENCOUNTER — Other Ambulatory Visit: Payer: Self-pay | Admitting: General Surgery

## 2015-12-15 DIAGNOSIS — C20 Malignant neoplasm of rectum: Secondary | ICD-10-CM

## 2015-12-17 ENCOUNTER — Ambulatory Visit
Admission: RE | Admit: 2015-12-17 | Discharge: 2015-12-17 | Disposition: A | Discharge status: Home / Self Care | Payer: 59 | Source: Ambulatory Visit | Attending: General Surgery | Admitting: General Surgery

## 2015-12-17 DIAGNOSIS — C20 Malignant neoplasm of rectum: Secondary | ICD-10-CM

## 2015-12-17 MED ORDER — IOPAMIDOL (ISOVUE-300) INJECTION 61%: 75.0000 mL | Freq: Once | INTRAVENOUS | Status: DC | PRN

## 2015-12-17 MED ORDER — GADOBENATE DIMEGLUMINE 529 MG/ML IV SOLN
14.0000 mL | Freq: Once | INTRAVENOUS | Status: AC | PRN
  Administered 2015-12-17: 14 mL via INTRAVENOUS

## 2015-12-18 ENCOUNTER — Telehealth: Payer: Self-pay | Admitting: Pharmacist

## 2015-12-18 ENCOUNTER — Other Ambulatory Visit: Payer: Self-pay | Admitting: Radiation Oncology

## 2015-12-18 ENCOUNTER — Ambulatory Visit (HOSPITAL_COMMUNITY)
Admission: RE | Admit: 2015-12-18 | Discharge: 2015-12-18 | Disposition: A | Discharge status: Home / Self Care | Payer: 59 | Source: Ambulatory Visit | Attending: Radiation Oncology | Admitting: Radiation Oncology

## 2015-12-18 ENCOUNTER — Ambulatory Visit (HOSPITAL_BASED_OUTPATIENT_CLINIC_OR_DEPARTMENT_OTHER): Payer: 59 | Admitting: Hematology

## 2015-12-18 ENCOUNTER — Encounter: Payer: Self-pay | Admitting: Hematology

## 2015-12-18 ENCOUNTER — Telehealth: Payer: Self-pay | Admitting: *Deleted

## 2015-12-18 ENCOUNTER — Ambulatory Visit (HOSPITAL_BASED_OUTPATIENT_CLINIC_OR_DEPARTMENT_OTHER): Payer: 59

## 2015-12-18 ENCOUNTER — Encounter: Payer: Self-pay | Admitting: *Deleted

## 2015-12-18 ENCOUNTER — Ambulatory Visit
Admission: RE | Admit: 2015-12-18 | Discharge: 2015-12-18 | Disposition: A | Discharge status: Home / Self Care | Payer: 59 | Source: Ambulatory Visit | Attending: Radiation Oncology | Admitting: Radiation Oncology

## 2015-12-18 ENCOUNTER — Telehealth: Payer: Self-pay | Admitting: Hematology

## 2015-12-18 VITALS — BP 107/69 | HR 71 | Temp 98.6°F | Resp 18 | Ht 62.0 in | Wt 163.1 lb

## 2015-12-18 DIAGNOSIS — C20 Malignant neoplasm of rectum: Secondary | ICD-10-CM

## 2015-12-18 DIAGNOSIS — K7689 Other specified diseases of liver: Secondary | ICD-10-CM | POA: Insufficient documentation

## 2015-12-18 DIAGNOSIS — N281 Cyst of kidney, acquired: Secondary | ICD-10-CM | POA: Insufficient documentation

## 2015-12-18 DIAGNOSIS — D734 Cyst of spleen: Secondary | ICD-10-CM | POA: Diagnosis not present

## 2015-12-18 LAB — COMPREHENSIVE METABOLIC PANEL
ALT: <9 U/L (ref 0–55)
AST: 17 U/L (ref 5–34)
Albumin: 4.2 g/dL (ref 3.5–5.0)
Alkaline Phosphatase: 62 U/L (ref 40–150)
Anion Gap: 8 mEq/L (ref 3–11)
BUN: 9.1 mg/dL (ref 7.0–26.0)
CO2: 26 mEq/L (ref 22–29)
Calcium: 9.4 mg/dL (ref 8.4–10.4)
Chloride: 106 mEq/L (ref 98–109)
Creatinine: 0.8 mg/dL (ref 0.6–1.1)
EGFR: >90 mL/min/{1.73_m2} (ref >90)
Glucose: 80 mg/dl (ref 70–140)
Potassium: 4 mEq/L (ref 3.5–5.1)
Sodium: 141 mEq/L (ref 136–145)
Total Bilirubin: 1.12 mg/dL (ref 0.20–1.20)
Total Protein: 7.4 g/dL (ref 6.4–8.3)

## 2015-12-18 LAB — CBC WITH DIFFERENTIAL/PLATELET
BASO%: 1.1 % (ref 0.0–2.0)
Basophils Absolute: 0 10*3/uL (ref 0.0–0.1)
EOS%: 3 % (ref 0.0–7.0)
Eosinophils Absolute: 0.1 10*3/uL (ref 0.0–0.5)
HCT: 41.2 % (ref 34.8–46.6)
HGB: 13.2 g/dL (ref 11.6–15.9)
LYMPH%: 33.7 % (ref 14.0–49.7)
MCH: 28.3 pg (ref 25.1–34.0)
MCHC: 32.1 g/dL (ref 31.5–36.0)
MCV: 88.3 fL (ref 79.5–101.0)
MONO#: 0.3 10*3/uL (ref 0.1–0.9)
MONO%: 7.1 % (ref 0.0–14.0)
NEUT#: 2 10*3/uL (ref 1.5–6.5)
NEUT%: 55.1 % (ref 38.4–76.8)
Platelets: 259 10*3/uL (ref 145–400)
RBC: 4.67 10*6/uL (ref 3.70–5.45)
RDW: 14.6 % — ABNORMAL HIGH (ref 11.2–14.5)
WBC: 3.6 10*3/uL — ABNORMAL LOW (ref 3.9–10.3)
lymph#: 1.2 10*3/uL (ref 0.9–3.3)

## 2015-12-18 LAB — FERRITIN: Ferritin: 16 ng/ml (ref 9–269)

## 2015-12-18 MED ORDER — GADOBENATE DIMEGLUMINE 529 MG/ML IV SOLN
15.0000 mL | Freq: Once | INTRAVENOUS | Status: AC | PRN
  Administered 2015-12-18: 15 mL via INTRAVENOUS

## 2015-12-18 MED ORDER — CAPECITABINE 500 MG PO TABS: 825.0000 mg/m2 | ORAL_TABLET | Freq: Two times a day (BID) | ORAL | Status: DC

## 2015-12-18 NOTE — Telephone Encounter (Signed)
12/18/15: New Rx to [facility] for Xeloda

## 2015-12-18 NOTE — Telephone Encounter (Signed)
CALLED PATIENT TO INFORM OF MRI FOR 12-18-15 @ 3 PM @ [facility]

## 2015-12-18 NOTE — Progress Notes (Signed)
Oncology Nurse Navigator Documentation  Oncology Nurse Navigator Flowsheets 12/18/2015  Navigator Location [facility]  Navigator Encounter Type Clinic/MDC  Abnormal Finding Date -  Confirmed Diagnosis Date -  Patient Visit Type MedOnc;RadOnc  Treatment Phase Pre-Tx/Tx Discussion  Barriers/Navigation Needs Education;Coordination of Care  Education Accessing Care/ Finding Providers;Understanding Cancer/ Treatment Options;Coping with Diagnosis/ Prognosis;Newly Diagnosed Cancer Education;Preparing for Upcoming  Treatment  Interventions Referrals  Referrals Rehab--Pelvic rehab  Support Groups/Services GI Support Group;[place] for [place] Manager/was seen today by CSW  Acuity Level 2  Time Spent with Patient 56  Met with patient, daughter HIllary and sister Dorain during new patient visit. Explained the role of the GI Nurse Navigator and provided New Patient Packet with information on: 1. Colorectal cancer--info on CEA, chemotherapy, SIM day what to expect, nutrition and exercise in cancer patients reviewed 2. Support groups 3. Advanced Directives 4. Fall Safety Plan Answered questions, reviewed current treatment plan using TEACH back and provided emotional support. Provided copy of current treatment plan. Since she is already having difficulty having BM and some urgency/incontinence will refer to physical therapy for pelvic floor rehab consult (per Dr. Burr Medico approval). Provided her with contact #'s for all who are on her treatment team. Explained that [facility] will contact her with the copay on her Xeloda. Escorted her to scheduling department.  Merceda Elks, RN, BSN GI Oncology [place]

## 2015-12-18 NOTE — Progress Notes (Signed)
[facility] Psychosocial Distress Screening Clinical Social Work  Clinical Social Work met with pt, daughter, her mother at GI Clinic to introduce self, review role/resources of Support Team and to review distress screening protocol.Clinical Social Work was referred by distress screening protocol.  The patient scored a 8 on the Psychosocial Distress Thermometer which indicates severe distress. Clinical Social Worker met with pt at length to assess for distress and other psychosocial needs. Pt is hopeful she can continue working through treatment up until surgery. Pt has strong support from daughter and extended family. CSW reviewed common emotions and coping techniques/resources to assist pt and family. Pt and family are very interested in a wide variety of programs through [facility]. Pt reports to often cope through exercising and walking. She shared that this has been hard due to her symptoms. She hopes to try yoga and tai chi at [facility]. Pt feels her distress has decreased since meeting with medical team. Pt and family agree to reach out as needed.   ONCBCN DISTRESS SCREENING 12/18/2015  Screening Type Initial Screening  Distress experienced in past week (1-10) 8  Emotional problem type Adjusting to illness;Feeling hopeless  Spiritual/Religous concerns type Facing my mortality  Information Concerns Type Lack of info about treatment;Lack of info about complementary therapy choices  Physical Problem type Nausea/vomiting;Constipation/diarrhea  Physician notified of physical symptoms Yes  Referral to support programs Yes    Clinical Social Worker follow up needed: No.  If yes, follow up plan:  Loren Racer, All[place]  [facility] Phone: [phone] Fax: [phone]

## 2015-12-18 NOTE — Patient Instructions (Signed)
  Care Plan Summary- 12/18/2015 Name:  Kaitlyn Morgan      DOB:  1965-11-25 Your Medical Team: Medical Oncologist:  Dr. Truitt Merle Radiation Oncologist:  Dr. Kyung Rudd Surgeon:   Dr. 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 Recommendations may change depending on the results of further tests or exams. 1) Radiation therapy X 28 fractions with oral Xeloda (both M-F)-possible start 12/28/15 2) MRI liver and labs to complete staging 3) Referral to Physical Therapy for pelvic floor rehab Next Steps: 1) Labs today and schedule chemo class for next week (weekly labs during tx period) 2) Radiology will call with MRI appointment and [place] will call when Xeloda is ready and with copay amount 3) Rehab will call with PT appointment. Dr. Burr Medico will see you back on 2nd week of treatment ______________________________________________________________________________   Questions? Merceda Elks, RN, BSN at [phone]. Manuela Schwartz is your Oncology Nurse Navigator and is available to assist you while you're receiving your medical care at [facility].

## 2015-12-18 NOTE — Telephone Encounter (Signed)
12/18/15: Rx requires specialty pharmacy - faxed rx to [facility] [phone] (Fax)

## 2015-12-18 NOTE — Progress Notes (Signed)
[place]  Telephone:([phone] Fax:([phone]) [place] Note   Patient Care Team: Leandrew Koyanagi, MD as PCP - General (Internal Medicine) Truitt Merle, MD as Consulting Physician (Hematology) Kyung Rudd, MD as Consulting Physician (Radiation Oncology) Jackolyn Confer, MD as Consulting Physician (General Surgery) Richmond Campbell, MD as Consulting Physician (Gastroenterology) Milus Banister, MD as Attending Physician (Gastroenterology) 12/18/2015  Referring physician: Dr. Zella Richer   CHIEF COMPLAINTS/PURPOSE OF CONSULTATION:  Newly diagnosed rectal cancer  Oncology History   Rectal cancer [facility])   Staging form: Colon and Rectum, AJCC 7th Edition     Clinical stage from 12/03/2015: Stage IIIB (T3, N1, M0) - Signed by Truitt Merle, MD on 12/18/2015       Rectal cancer ([place])   12/03/2015 Initial Diagnosis Rectal cancer ([place])   12/03/2015 Procedure Colonoscopy showed a partially obstructing tumor in the proximal rectum, its diameter measured 6 mm, biopsed   12/03/2015 Initial Biopsy Rectum mass biopsy showed adenocarcinoma, moderately differentiated   12/10/2015 Imaging CT abdomen and pelvis with contrast showed luminal narrowing and mural thickening in the proximal to mid rectum. There is a 6 mm short axis mesorectal lymph node suspicious for metastasis. There are several tiny indeterminate liver lesions    12/17/2015 Imaging MRI pelvis with and without contrast showed a advanced T3 rectal adenocarcinoma (7cm), N1 disease (at least 2 left-sided nasal rectal lymph nodes are seen measuring 6-7 mm), distance from tumor to the sphincter is 8 cm   12/17/2015 Imaging CT chest with contrast showed no evidence of metastasis or other changes    HISTORY OF PRESENTING ILLNESS:  Kaitlyn Morgan 50 y.o. female is here because of Her newly diagnosed rectal cancer. She is accompanied by her daughter and her mother to our multidisciplinary GI clinic today.  She has had chronic  abdominal pain for 2-3 years, in the epigastric area and low abdomen, which has been worse lately, and she noticed constipation, abdominal bloating and mild rectal bleeding for 2 months. She was referred to Dr. Earlean Shawl and underwent colonoscopy on 12/03/2015. A partial obstructive rectal mass was found in the proximal rectum, biopsy showed adenocarcinoma. She was referred to surgeon Dr. Zella Richer who referred her to [place] to consider new adjuvant chemoradiation.  She feels well overall, has mild fatigue, but able to tolerate her routine full-time job in daily activities without difficulties. She has good appetite, but eats less due to the epigastric pain after meal. She denies any other significant pain, cough, or other symptoms. No recent weight loss. She has been on phentermine for weight loss recently, and stopped a few days ago.   MEDICAL HISTORY:  History reviewed. No pertinent past medical history.  SURGICAL HISTORY: Past Surgical History  Procedure Laterality Date  . Cesarean section    . Abdominal hysterectomy    . Tubal ligation      SOCIAL HISTORY: Social History   Social History  . Marital Status: Legally Separated    Spouse Name: N/A  . Number of Children: N/A  . Years of Education: N/A   Occupational History  . Not on file.   Social History Main Topics  . Smoking status: Never Smoker   . Smokeless tobacco: Never Used  . Alcohol Use: No  . Drug Use: No  . Sexual Activity: Not on file   Other Topics Concern  . Not on file   Social History Narrative   She has 3 children, she lives with her twin sons who are  101. She works for [employer]   FAMILY HISTORY: Family History  Problem Relation Age of Onset  . Cancer Maternal Grandmother 83    breast cancer   . Cancer Paternal Grandmother 61    uterine cancer   . Hypertension Father   . Hypertension Sister   . Hypertension Paternal Grandfather   . Heart attack Paternal Grandfather   . Hypertension Sister      ALLERGIES:  is allergic to morphine and related and zolpidem tartrate.  MEDICATIONS:  Current Outpatient Prescriptions  Medication Sig Dispense Refill  . fluticasone (FLONASE) 50 MCG/ACT nasal spray 1 spray each nostril twice a day 16 g 6  . Polyethylene Glycol 3350 (MIRALAX PO) Take 17 g by mouth daily as needed.    . capecitabine (XELODA) 500 MG tablet Take 3 tablets (1,500 mg total) by mouth 2 (two) times daily after a meal. 90 tablet 1  . Loratadine (CLARITIN PO) Take by mouth. Reported on 12/18/2015    . PHENTERMINE HCL PO Take 1 capsule by mouth daily as needed. Reported on 12/18/2015     No current facility-administered medications for this visit.    REVIEW OF SYSTEMS:   Constitutional: Denies fevers, chills or abnormal night sweats Eyes: Denies blurriness of vision, double vision or watery eyes Ears, nose, mouth, throat, and face: Denies mucositis or sore throat Respiratory: Denies cough, dyspnea or wheezes Cardiovascular: Denies palpitation, chest discomfort or lower extremity swelling Gastrointestinal:  Denies nausea, heartburn or change in bowel habits Skin: Denies abnormal skin rashes Lymphatics: Denies new lymphadenopathy or easy bruising Neurological:Denies numbness, tingling or new weaknesses Behavioral/Psych: Mood is stable, no new changes  All other systems were reviewed with the patient and are negative.  PHYSICAL EXAMINATION: ECOG PERFORMANCE STATUS: 1 - Symptomatic but completely ambulatory  Filed Vitals:   12/18/15 0809  BP: 107/69  Pulse: 71  Temp: 98.6 F (37 C)  Resp: 18   Filed Weights   12/18/15 0809  Weight: 163 lb 1.6 oz (73.982 kg)    GENERAL:alert, no distress and comfortable SKIN: skin color, texture, turgor are normal, no rashes or significant lesions EYES: normal, conjunctiva are pink and non-injected, sclera clear OROPHARYNX:no exudate, no erythema and lips, buccal mucosa, and tongue normal  NECK: supple, thyroid normal size,  non-tender, without nodularity LYMPH:  no palpable lymphadenopathy in the cervical, axillary or inguinal LUNGS: clear to auscultation and percussion with normal breathing effort HEART: regular rate & rhythm and no murmurs and no lower extremity edema ABDOMEN:abdomen soft, non-tender and normal bowel sounds Musculoskeletal:no cyanosis of digits and no clubbing  PSYCH: alert & oriented x 3 with fluent speech NEURO: no focal motor/sensory deficits  LABORATORY DATA:  I have reviewed the data as listed Lab Results  Component Value Date   WBC 5.0 10/03/2015   HGB 12.9 10/03/2015   HCT 37.8 10/03/2015   MCV 87.8 10/03/2015   No results for input(s): NA, K, CL, CO2, GLUCOSE, BUN, CREATININE, CALCIUM, GFRNONAA, GFRAA, PROT, ALBUMIN, AST, ALT, ALKPHOS, BILITOT, BILIDIR, IBILI in the last 8760 hours.  PATHOLOGY REPORT Diagnosis 12/03/2015 Colon, sigmoid, mass, biopsy -Invasive adenocarcinoma, moderately differentiated. See comment.  Comment: due to the presence of adenocarcinoma, IHC for DNA mismatch repair proteins will be performed and reported in an addendum.   RADIOGRAPHIC STUDIES: I have personally reviewed the radiological images as listed and agreed with the findings in the report. Ct Chest W Contrast  12/17/2015  CLINICAL DATA:  Newly diagnosed rectal carcinoma.  Staging. EXAM:  CT CHEST WITH CONTRAST TECHNIQUE: Multidetector CT imaging of the chest was performed during intravenous contrast administration. CONTRAST:  75 mL Isovue-300 COMPARISON:  AP CT on 12/10/2015 FINDINGS: Mediastinum/Lymph Nodes: No masses, pathologically enlarged lymph nodes, or other significant abnormality. Lungs/Pleura: No pulmonary mass, infiltrate, or effusion. Upper abdomen: Several tiny sub-cm low-attenuation liver lesions noted, as seen on recent abdomen CT of 12/10/2015 ; see this prior separate report. Musculoskeletal: No chest wall mass or suspicious bone lesions identified. Mild thoracic spine degenerative  changes noted. IMPRESSION: No evidence of metastatic disease or other acute findings within the thorax. Electronically Signed   By: Earle Gell M.D.   On: 12/17/2015 14:53   Mr Pelvis W Wo Contrast  12/17/2015  CLINICAL DATA:  Newly diagnosed rectal carcinoma. EXAM: MRI PELVIS WITHOUT AND WITH CONTRAST TECHNIQUE: Multiplanar multisequence MR imaging of the pelvis was performed both before and after administration of intravenous contrast. Small amount of [place] gel was administered per rectum to optimize tumor evaluation. CONTRAST:  66m MULTIHANCE GADOBENATE DIMEGLUMINE 529 MG/ML IV SOLN COMPARISON:  CT on 12/10/2015 FINDINGS: TUMOR LOCATION Location from Anal Verge:  Mid to upper rectum Shortest Distance from Tumor to Anal Sphincter:  8 cm TUMOR DESCRIPTION Circumferential Extent: Completely circumferential, annular, and constricting Craniocaudal Extent:  Approximately 7 cm T - CATEGORY Extension through Muscularis Propria:  Yes = T3 Maximum extension beyond Muscularis Propria:  15 mm (early T3 = <563m advanced T3 = >36m17mExtramural vascular invasion/tumor thrombus:  None visualized Shortest distance of any tumor/node from Mesorectal Fascia: Several perirectal lymph nodes are seen, with posterior/presacral perirectal lymph node measuring approximately 3 mm from the mesorectal fascia on image 22 of series 4 Invasion of Anterior Peritoneal Reflection:  No Involvement of Adjacent Organs or Pelvic Sidewall Structures:  No N - CATEGORY Mesorectal Lymph Nodes >=36mm41mes, at least 2 left-sided nasal rectal lymph nodes are seen measuring 6-7 mm. Other tiny less than 5 minute Mayer ease of rectal lymph nodes are also seen. Extra-mesorectal Lymphadenopathy:  No Other: Prior hysterectomy noted. Both ovaries are normal in appearance. IMPRESSION: Rectal adenocarcinoma T stage:  Advanced T3 Rectal adenocarcinoma N stage:  N1 Distance from tumor to the anal sphincter is 8 cm. Electronically Signed   By: JohnEarle Gell.   On:  12/17/2015 16:02   Ct Abdomen Pelvis W Contrast  12/10/2015  CLINICAL DATA:  49 y40r old female with history of rectal cancer diagnosed on 12/03/2015. EXAM: CT ABDOMEN AND PELVIS WITH CONTRAST TECHNIQUE: Multidetector CT imaging of the abdomen and pelvis was performed using the standard protocol following bolus administration of intravenous contrast. CONTRAST:  100mL536mVUE-300 IOPAMIDOL (ISOVUE-300) INJECTION 61% COMPARISON:  CT the abdomen and pelvis 05/24/2006. FINDINGS: Lower chest:  Unremarkable. Hepatobiliary: Four tiny sub cm low-attenuation lesions are noted in the liver, too small to characterize. One of these in segment 7 (image 14 of series 2) may have been present on remote prior study 05/24/2006. The others cannot be confirmed on the prior examination. No larger more suspicious appearing hepatic lesions are noted. No intra or extrahepatic biliary ductal dilatation. Gallbladder is normal in appearance. Pancreas: No pancreatic mass. No pancreatic ductal dilatation. No pancreatic or peripancreatic fluid or inflammatory changes. Spleen: Sub cm low-attenuation lesion in the lower pole of the spleen is incompletely characterized, but has a benign appearance, likely a tiny cyst. Adrenals/Urinary Tract: 5 mm nonobstructive calculus in the lower pole collecting system of the left kidney. Sub cm low-attenuation lesions in the right kidney are too small  to definitively characterize, but are statistically likely tiny cysts. Bilateral adrenal glands are normal in appearance. No hydroureteronephrosis. Urinary bladder is normal in appearance. Stomach/Bowel: Normal appearance of the stomach. No pathologic dilatation of small bowel or colon. In the proximal to mid rectum there is a focal area of luminal narrowing and mild mural thickening, which likely corresponds to the recently biopsy-proven neoplasm. This is best appreciated on axial image 72 of series 2, coronal image 57 of series 3, and sagittal image 57 of series  4. Normal appendix. Vascular/Lymphatic: No significant atherosclerotic disease, aneurysm or dissection identified in the abdominal or pelvic vasculature. 6 mm left-sided mesorectal lymph node (image 70 of series 2) is nonspecific. No other lymphadenopathy noted in the abdomen or pelvis. Reproductive: Status post hysterectomy.  Ovaries are atrophic. Other: No significant volume of ascites.  No pneumoperitoneum. Musculoskeletal: There are no aggressive appearing lytic or blastic lesions noted in the visualized portions of the skeleton. IMPRESSION: 1. Area of luminal narrowing and mural thickening in the proximal to mid rectum, as discussed above, which may correspond to the recently biopsy-proven rectal neoplasm. Adjacent to this there is a nonenlarged but conspicuous 6 mm short axis mesorectal lymph node (image 70 of series 2). No other definite findings to suggest metastatic disease in the abdomen or pelvis. However, there are several tiny indeterminate sub cm liver lesions. While these would typically be favored to be small cysts, in the setting of a primary malignancy, further characterization with MRI of the abdomen with and without IV gadolinium is recommended to exclude the possibility of tiny hepatic metastases. 2. 5 mm nonobstructive calculus in the lower pole collecting system of the left kidney. No ureteral stones or findings of urinary tract obstruction are noted at this time. 3. Additional incidental findings, as above Electronically Signed   By: Vinnie Langton M.D.   On: 12/10/2015 11:13   COLONOSCOPY DR. MEDOFF 12/03/2015 Impression Malignant partially obstructing tumor in the proximal rectum, its diameter measured 6 mm, biopsy. Distal margin at 15 cm.    ASSESSMENT & PLAN:  50 year old Caucasian female, without significant past medical history, presented with abdominal pain and rectal bleeding. Colonoscopy showed a obstructing tumor in the proximal rectum.  1. Rectal adenocarcinoma, proximal  rectum, cT3N1M0, stage IIIB, moderately differentiated --I have reviewed her colonoscopy, EUS, MRI of pelvis, CT of abdomen pelvis finding with pt and her family in details -The pelvic MRI showed a T3 lesion, 2 suspicious lymph nodes, likely state IIIB disease. She also was found to have multiple small indeterminate hypodense lesion in the liver, which was not found on her prior CT scan in 2007. We will order a abdominal MRI to further look into the liver lesions.  -CT chest was negative for distant metastasis. -We reviewed the natural history of rectal cancer, risk of recurrence after surgery, depending on the stage. We discussed the treatment option for stage IIIB rectal cancer. The standard of care is neoadjuvant chemotherapy and radiation, followed by surgery, then adjuvant chemotherapy. -I discussed the option of continuous 5-FU infusion and Xeloda with concurrent radiation. The potential side effects and benefits were discussed with patient. She opted Xeloda.prescription of xeloda '1500mg'$  twice daily will be sent out today  --Chemotherapy consent: Side effects including but does not not limited to, fatigue, nausea, vomiting, diarrhea, hair loss, neuropathy, fluid retention, renal and kidney dysfunction, neutropenic fever, needed for blood transfusion, bleeding, coronary artery spasm and heart attack, were discussed with patient in great detail. She agrees to proceed. -  She was seen by a radiation oncologist Dr. Lisbeth Renshaw today. She will start radiation on May 15 -I will see her back on her second week of treatment, 5/23, sooner if her liver MRI is abnormal.  -Dietitian and physical therapy referral  Plan -Lab today, including CBC, CMP, CEA and ferritin -Abdominal MRI with and without contrast as soon as possible -She is scheduled to start concurrent chemoradiation on May 15 -I will send her Xeloda prescription to [place] today. -I'll see her back on May 23   Orders Placed This  Encounter  Procedures  . CBC with Differential    Standing Status: Standing     Number of Occurrences: 30     Standing Expiration Date: 12/17/2020  . Comprehensive metabolic panel    Standing Status: Standing     Number of Occurrences: 30     Standing Expiration Date: 12/17/2020  . CEA    Standing Status: Standing     Number of Occurrences: 15     Standing Expiration Date: 12/17/2020    All questions were answered. The patient knows to call the clinic with any problems, questions or concerns. I spent 55 minutes counseling the patient face to face. The total time spent in the appointment was 60 minutes and more than 50% was on counseling.     Truitt Merle, MD 12/18/2015 10:57 AM

## 2015-12-18 NOTE — Telephone Encounter (Signed)
per pof to sch pt appt-gave pt copy of avs °

## 2015-12-18 NOTE — Progress Notes (Signed)
Radiation Oncology         [phone] ________________________________  Name: Kaitlyn Morgan MRN: [id number]  Date: 12/18/2015  DOB: 20-May-1966  [id number], Linton Ham, MD  Jackolyn Confer, MD     REFERRING PHYSICIAN: Jackolyn Confer, MD   DIAGNOSIS: The encounter diagnosis was Rectal cancer [facility]).   HISTORY OF PRESENT ILLNESS: Kaitlyn Morgan is a 50 y.o. female seen in the [facility] GI clinic for a new diagnosis of clinical Stage IIIB (T3, N1, M0) adenocarcinoma of the rectum. The patient apparently started to experience hematochezia, as seen on 12/03/2015 where she underwent colonoscopy revealing a partially obstructing tumor in the proximal rectum measuring 6 mm in diameter, and this lesion was 15 cm from the anal verge. This this was biopsied and revealed adenocarcinoma, moderately differentiated. She has undergone staging CT of the abdomen and pelvis with contrast revealing luminal narrowing and mural thickening of the proximal to mid rectum with suspicious mesial rectal lymph node identified measuring 6 mm and several tiny indeterminate liver lesions. She did undergo an MRI of the pelvis with and without contrast which revealed a 7 cm tumor, with at least 2 left-sided nasal rectal lymph nodes measuring 6-7 mm, and the distance of the tumor to the sphincter is 8 cm. A CT of the chest was also performed yesterday along with her MRI, and this did not reveal any concerns for metastatic disease. She comes for further discussion of the role of radiotherapy in conjunction with neoadjuvant chemotherapy.    PREVIOUS RADIATION THERAPY: No   PAST MEDICAL HISTORY: No past medical history on file.     PAST SURGICAL HISTORY: Past Surgical History  Procedure Laterality Date  . Cesarean section    . Abdominal hysterectomy    . Tubal ligation       FAMILY HISTORY: No family history on file.   SOCIAL HISTORY:  reports that she has never smoked. She has never used smokeless tobacco. She  reports that she does not drink alcohol or use illicit drugs. The patient lives in [place]. She works as a Engineer, structural. She is legally separated.   ALLERGIES: Morphine and related and Zolpidem tartrate   MEDICATIONS:  Current Outpatient Prescriptions  Medication Sig Dispense Refill  . fluticasone (FLONASE) 50 MCG/ACT nasal spray 1 spray each nostril twice a day 16 g 6  . Loratadine (CLARITIN PO) Take by mouth. Reported on 12/18/2015    . Polyethylene Glycol 3350 (MIRALAX PO) Take 17 g by mouth daily as needed.     No current facility-administered medications for this encounter.    REVIEW OF SYSTEMS: On review of systems, the patient reports that she is doing well overall. She has had urgency for the past month. She denies any blood per rectum, and denies hematochezia. She denies any chest pain, shortness of breath, cough, fevers, chills, night sweats, unintended weight changes. She denies any  bladder disturbances, and denies abdominal pain, nausea or vomiting. She denies any new musculoskeletal or joint aches or pains. A complete review of systems is obtained and is otherwise negative.    PHYSICAL EXAM:   Pain scale 010 In general this is a well appearing Caucasian female in no acute distress. She is alert and oriented x4 and appropriate throughout the examination. HEENT reveals that the patient is normocephalic, atraumatic. EOMs are intact. PERRLA. Skin is intact without any evidence of gross lesions. Cardiovascular exam reveals a regular rate and rhythm, no clicks rubs or murmurs are auscultated. Chest is clear to auscultation  bilaterally. Lymphatic assessment is performed and does not reveal any adenopathy in the cervical, supraclavicular, axillary, or inguinal chains. Abdomen has active bowel sounds in all quadrants and is intact. The abdomen is soft, non tender, non distended. Lower extremities are negative for pretibial pitting edema, deep calf tenderness, cyanosis or clubbing.  Pelvic exam reveals normal-appearing external female genitalia without lesions. Rectal exam reveals no palpable lesions distally.   ECOG = 0  0 - Asymptomatic (Fully active, able to carry on all predisease activities without restriction)  1 - Symptomatic but completely ambulatory (Restricted in physically strenuous activity but ambulatory and able to carry out work of a light or sedentary nature. For example, light housework, office work)  2 - Symptomatic, <50% in bed during the day (Ambulatory and capable of all self care but unable to carry out any work activities. Up and about more than 50% of waking hours)  3 - Symptomatic, >50% in bed, but not bedbound (Capable of only limited self-care, confined to bed or chair 50% or more of waking hours)  4 - Bedbound (Completely disabled. Cannot carry on any self-care. Totally confined to bed or chair)  5 - Death   Eustace Pen MM, Creech RH, Tormey DC, et al. [phone]). "Toxicity and response criteria of the [facility]". [place] Oncol. 5 (6): 649-55    LABORATORY DATA:  Lab Results  Component Value Date   WBC 5.0 10/03/2015   HGB 12.9 10/03/2015   HCT 37.8 10/03/2015   MCV 87.8 10/03/2015   No results found for: NA, K, CL, CO2 No results found for: ALT, AST, GGT, ALKPHOS, BILITOT    RADIOGRAPHY: Ct Chest W Contrast  12/17/2015  CLINICAL DATA:  Newly diagnosed rectal carcinoma.  Staging. EXAM: CT CHEST WITH CONTRAST TECHNIQUE: Multidetector CT imaging of the chest was performed during intravenous contrast administration. CONTRAST:  75 mL Isovue-300 COMPARISON:  AP CT on 12/10/2015 FINDINGS: Mediastinum/Lymph Nodes: No masses, pathologically enlarged lymph nodes, or other significant abnormality. Lungs/Pleura: No pulmonary mass, infiltrate, or effusion. Upper abdomen: Several tiny sub-cm low-attenuation liver lesions noted, as seen on recent abdomen CT of 12/10/2015 ; see this prior separate report. Musculoskeletal: No chest  wall mass or suspicious bone lesions identified. Mild thoracic spine degenerative changes noted. IMPRESSION: No evidence of metastatic disease or other acute findings within the thorax. Electronically Signed   By: Earle Gell M.D.   On: 12/17/2015 14:53   Mr Pelvis W Wo Contrast  12/17/2015  CLINICAL DATA:  Newly diagnosed rectal carcinoma. EXAM: MRI PELVIS WITHOUT AND WITH CONTRAST TECHNIQUE: Multiplanar multisequence MR imaging of the pelvis was performed both before and after administration of intravenous contrast. Small amount of [place] gel was administered per rectum to optimize tumor evaluation. CONTRAST:  42mL MULTIHANCE GADOBENATE DIMEGLUMINE 529 MG/ML IV SOLN COMPARISON:  CT on 12/10/2015 FINDINGS: TUMOR LOCATION Location from Anal Verge:  Mid to upper rectum Shortest Distance from Tumor to Anal Sphincter:  8 cm TUMOR DESCRIPTION Circumferential Extent: Completely circumferential, annular, and constricting Craniocaudal Extent:  Approximately 7 cm T - CATEGORY Extension through Muscularis Propria:  Yes = T3 Maximum extension beyond Muscularis Propria:  15 mm (early T3 = <3mm; advanced T3 = >47mm) Extramural vascular invasion/tumor thrombus:  None visualized Shortest distance of any tumor/node from Mesorectal Fascia: Several perirectal lymph nodes are seen, with posterior/presacral perirectal lymph node measuring approximately 3 mm from the mesorectal fascia on image 22 of series 4 Invasion of Anterior Peritoneal Reflection:  No Involvement of  Adjacent Organs or Pelvic Sidewall Structures:  No N - CATEGORY Mesorectal Lymph Nodes >=64mm: Yes, at least 2 left-sided nasal rectal lymph nodes are seen measuring 6-7 mm. Other tiny less than 5 minute Mayer ease of rectal lymph nodes are also seen. Extra-mesorectal Lymphadenopathy:  No Other: Prior hysterectomy noted. Both ovaries are normal in appearance. IMPRESSION: Rectal adenocarcinoma T stage:  Advanced T3 Rectal adenocarcinoma N stage:  N1 Distance from tumor to  the anal sphincter is 8 cm. Electronically Signed   By: Earle Gell M.D.   On: 12/17/2015 16:02   Ct Abdomen Pelvis W Contrast  12/10/2015  CLINICAL DATA:  50 year old female with history of rectal cancer diagnosed on 12/03/2015. EXAM: CT ABDOMEN AND PELVIS WITH CONTRAST TECHNIQUE: Multidetector CT imaging of the abdomen and pelvis was performed using the standard protocol following bolus administration of intravenous contrast. CONTRAST:  157mL ISOVUE-300 IOPAMIDOL (ISOVUE-300) INJECTION 61% COMPARISON:  CT the abdomen and pelvis 05/24/2006. FINDINGS: Lower chest:  Unremarkable. Hepatobiliary: Four tiny sub cm low-attenuation lesions are noted in the liver, too small to characterize. One of these in segment 7 (image 14 of series 2) may have been present on remote prior study 05/24/2006. The others cannot be confirmed on the prior examination. No larger more suspicious appearing hepatic lesions are noted. No intra or extrahepatic biliary ductal dilatation. Gallbladder is normal in appearance. Pancreas: No pancreatic mass. No pancreatic ductal dilatation. No pancreatic or peripancreatic fluid or inflammatory changes. Spleen: Sub cm low-attenuation lesion in the lower pole of the spleen is incompletely characterized, but has a benign appearance, likely a tiny cyst. Adrenals/Urinary Tract: 5 mm nonobstructive calculus in the lower pole collecting system of the left kidney. Sub cm low-attenuation lesions in the right kidney are too small to definitively characterize, but are statistically likely tiny cysts. Bilateral adrenal glands are normal in appearance. No hydroureteronephrosis. Urinary bladder is normal in appearance. Stomach/Bowel: Normal appearance of the stomach. No pathologic dilatation of small bowel or colon. In the proximal to mid rectum there is a focal area of luminal narrowing and mild mural thickening, which likely corresponds to the recently biopsy-proven neoplasm. This is best appreciated on axial  image 72 of series 2, coronal image 57 of series 3, and sagittal image 57 of series 4. Normal appendix. Vascular/Lymphatic: No significant atherosclerotic disease, aneurysm or dissection identified in the abdominal or pelvic vasculature. 6 mm left-sided mesorectal lymph node (image 70 of series 2) is nonspecific. No other lymphadenopathy noted in the abdomen or pelvis. Reproductive: Status post hysterectomy.  Ovaries are atrophic. Other: No significant volume of ascites.  No pneumoperitoneum. Musculoskeletal: There are no aggressive appearing lytic or blastic lesions noted in the visualized portions of the skeleton. IMPRESSION: 1. Area of luminal narrowing and mural thickening in the proximal to mid rectum, as discussed above, which may correspond to the recently biopsy-proven rectal neoplasm. Adjacent to this there is a nonenlarged but conspicuous 6 mm short axis mesorectal lymph node (image 70 of series 2). No other definite findings to suggest metastatic disease in the abdomen or pelvis. However, there are several tiny indeterminate sub cm liver lesions. While these would typically be favored to be small cysts, in the setting of a primary malignancy, further characterization with MRI of the abdomen with and without IV gadolinium is recommended to exclude the possibility of tiny hepatic metastases. 2. 5 mm nonobstructive calculus in the lower pole collecting system of the left kidney. No ureteral stones or findings of urinary tract obstruction are  noted at this time. 3. Additional incidental findings, as above Electronically Signed   By: Vinnie Langton M.D.   On: 12/10/2015 11:13       IMPRESSION: clinical Stage IIIB (T3, N1, M0) adenocarcinoma of the rectum.     PLAN: Dr. Lisbeth Renshaw discusses the findings on her imaging studies, and recommends proceeding with an MRI of the liver to rule out any discrete metastatic lesions. We will follow-up with these results. He also discusses the findings from her  pathology, and reviews that the current clinical staging is 4 stage IIIB. Based on this p.m. Dr. Burr Medico agreed that moving forward with neoadjuvant chemotherapy and radiation would be recommended in order to obtain local control for subsequent resection following this. The patient is in agreement and interested in moving forward. The risks, benefits and long as well as short-term effects of radiotherapy are reviewed. He did recommend proceeding with a total of 5-1/2 weeks of treatment which would equal 28 fractions through Friday. The patient is interested in beginning soon as possible, and Dr. Burr Medico has recommended initiating Xeloda chemotherapy in addition to the radiotherapy. She is given a appointment for simulation next Wednesday, May 10, with the anticipation of beginning treatments following week.  The above documentation reflects my direct findings during this shared patient visit. Please see the separate note by Dr. Lisbeth Renshaw on this date for the remainder of the patient's plan of care.    Carola Rhine, PAC

## 2015-12-19 LAB — CEA: CEA: 0.9 ng/mL (ref 0.0–4.7)

## 2015-12-22 NOTE — Addendum Note (Signed)
Encounter addended by: Kyung Rudd, MD on: 12/22/2015  5:25 PM<BR>     Documentation filed: Follow-up Section, LOS Section

## 2015-12-23 ENCOUNTER — Ambulatory Visit
Admission: RE | Admit: 2015-12-23 | Discharge: 2015-12-23 | Disposition: A | Discharge status: Home / Self Care | Payer: 59 | Source: Ambulatory Visit | Attending: Radiation Oncology | Admitting: Radiation Oncology

## 2015-12-23 ENCOUNTER — Encounter: Payer: Self-pay | Admitting: *Deleted

## 2015-12-23 ENCOUNTER — Other Ambulatory Visit: Payer: 59

## 2015-12-23 ENCOUNTER — Other Ambulatory Visit: Payer: Self-pay | Admitting: *Deleted

## 2015-12-23 DIAGNOSIS — C20 Malignant neoplasm of rectum: Secondary | ICD-10-CM | POA: Diagnosis present

## 2015-12-23 DIAGNOSIS — Z510 Encounter for antineoplastic radiation therapy: Secondary | ICD-10-CM | POA: Insufficient documentation

## 2015-12-24 ENCOUNTER — Other Ambulatory Visit: Payer: Self-pay | Admitting: Hematology

## 2015-12-24 ENCOUNTER — Telehealth: Payer: Self-pay

## 2015-12-24 MED ORDER — ONDANSETRON HCL 8 MG PO TABS: 8.0000 mg | ORAL_TABLET | Freq: Three times a day (TID) | ORAL | Status: DC | PRN

## 2015-12-24 NOTE — Telephone Encounter (Signed)
I e-scripted zofran to her pharmacy, please let her know.   Myrtle, please follow her Xeloda, she needs to start Xeloda along with radiation on 5/17.   Thanks,  Truitt Merle  12/24/2015

## 2015-12-24 NOTE — Telephone Encounter (Signed)
Pt called with question about a work related program of biometric screening which will put a small amount of money into her acct at work. She has a biometrics appt June 7th, she is starting xeloda and XRT on May 17.   Called pt and she was concerned about getting needle stick for blood draw for biometrics. Told her this would be OK.  Also stated chemo should not interfere with measurements for diabetes, high cholesterol and such.  Pt will need anti emetics ordered Please call pt when order sent.

## 2015-12-25 NOTE — Telephone Encounter (Signed)
Called pt & informed of zofran script & she reports that she received her xeloda today with zero co-pay.

## 2015-12-27 NOTE — Progress Notes (Signed)
  Radiation Oncology         [phone]) [phone] ________________________________  Name: Kaitlyn Morgan MRN: [id number]  Date: 12/23/2015  DOB: 29-Apr-1966   SIMULATION AND TREATMENT PLANNING NOTE  DIAGNOSIS:     ICD-9-CM ICD-10-CM   1. Rectal cancer ([place]) 154.1 C20      The patient presented for simulation for the patient's upcoming course of radiation for the diagnosis of rectal cancer. The patient was placed in a supine position. A customized vac-lock bag was constructed to aid in patient immobilization on. This complex treatment device will be used on a daily basis during the treatment. In this fashion a CT scan was obtained through the pelvic region and the isocenter was placed near midline within the pelvis. Surface markings were placed.  The patient's imaging was loaded into the radiation treatment planning system. The patient will initially be planned to receive a course of radiation to a dose of 45 Gy. This will be accomplished in 25 fractions at 1.8 gray per fraction. This initial treatment will correspond to a 3-D conformal technique. The target has been contoured in addition to the rectum, bladder and femoral heads. Dose volume histograms of each of these structures have been requested and these will be carefully reviewed as part of the 3-D conformal treatment planning process. To accomplish this initial treatment, 4 customized blocks have been designed for this purpose. Each of these 4 complex treatment devices will be used on a daily basis during the initial course of the treatment. It is anticipated that the patient will then receive a boost for an additional 5.4 Gy. The anticipated total dose therefore will be 50.4 Gy.    Special treatment procedure The patient will receive chemotherapy during the course of radiation treatment. The patient may experience increased or overlapping toxicity due to this combined-modality approach and the patient will be monitored for such problems. This  may include extra lab work as necessary. This therefore constitutes a special treatment procedure.    ________________________________  Jodelle Gross, MD, PhD

## 2015-12-27 NOTE — Progress Notes (Signed)
  Radiation Oncology         [phone] ________________________________  Name: Kaitlyn Morgan MRN: [id number]  Date: 12/23/2015  DOB: Dec 13, 1965  Optical Surface Tracking Plan:  Since intensity modulated radiotherapy (IMRT) and 3D conformal radiation treatment methods are predicated on accurate and precise positioning for treatment, intrafraction motion monitoring is medically necessary to ensure accurate and safe treatment delivery.  The ability to quantify intrafraction motion without excessive ionizing radiation dose can only be performed with optical surface tracking. Accordingly, surface imaging offers the opportunity to obtain 3D measurements of patient position throughout IMRT and 3D treatments without excessive radiation exposure.  I am ordering optical surface tracking for this patient's upcoming course of radiotherapy. ________________________________  Kyung Rudd, MD 12/27/2015 10:31 PM    Reference:   Ursula Alert, J, et al. Surface imaging-based analysis of intrafraction motion for breast radiotherapy patients.Journal of [employer], n. 6, nov. 2014. ISSN [id number].   Available at: <http://www.jacmp.org/index.php/jacmp/article/view/4957>.

## 2015-12-28 ENCOUNTER — Telehealth: Payer: Self-pay | Admitting: Nutrition

## 2015-12-28 ENCOUNTER — Ambulatory Visit: Payer: 59 | Admitting: Nutrition

## 2015-12-28 NOTE — Progress Notes (Signed)
See telephone note.

## 2015-12-28 NOTE — Telephone Encounter (Signed)
Patient contacted me by phone. She had questions regarding using vitamin and mineral supplements, including juice plus. Patient also had questions regarding organic food. Answered patient's questions.  I will mail fact sheets to patient at home address. Encouraged patient to contact me with further questions or concerns.

## 2015-12-29 ENCOUNTER — Encounter: Payer: Self-pay | Admitting: Pharmacist

## 2015-12-29 DIAGNOSIS — Z510 Encounter for antineoplastic radiation therapy: Secondary | ICD-10-CM | POA: Diagnosis not present

## 2015-12-29 NOTE — Progress Notes (Signed)
Oral Chemotherapy Pharmacist Encounter   I spoke with patient for overview of new oral chemotherapy medication: Xeloda. Pt is doing well. Specialty pharmacy through [facility] required. Copay assistance received through [facility] and copay is $0. Medication received on Friday 5/12 and patient is set to start with radiation on 5/17. Pt knows to take 3 tablets twice daily with food on radiation days only (Monday-Friday, off on the weekend)  Counseled patient on administration, dosing, side effects, safe handling, and monitoring. Side effects include but not limited to: Diarrhea, fatigue, nausea, mouth sores, and hand/foot syndrome.  Kaitlyn Morgan voiced understanding and appreciation.   All questions answered.  Will follow up in 1 week for adherence and toxicity management.   Thank you,  Montel Clock, PharmD, [facility]

## 2015-12-30 ENCOUNTER — Ambulatory Visit
Admission: RE | Admit: 2015-12-30 | Discharge: 2015-12-30 | Disposition: A | Discharge status: Home / Self Care | Payer: 59 | Source: Ambulatory Visit | Attending: Radiation Oncology | Admitting: Radiation Oncology

## 2015-12-30 DIAGNOSIS — Z510 Encounter for antineoplastic radiation therapy: Secondary | ICD-10-CM | POA: Diagnosis not present

## 2015-12-31 ENCOUNTER — Ambulatory Visit
Admission: RE | Admit: 2015-12-31 | Discharge: 2015-12-31 | Disposition: A | Discharge status: Home / Self Care | Payer: 59 | Source: Ambulatory Visit | Attending: Radiation Oncology | Admitting: Radiation Oncology

## 2015-12-31 ENCOUNTER — Encounter: Payer: Self-pay | Admitting: Hematology

## 2015-12-31 DIAGNOSIS — Z510 Encounter for antineoplastic radiation therapy: Secondary | ICD-10-CM | POA: Diagnosis not present

## 2015-12-31 NOTE — Progress Notes (Signed)
left in box--fmla form

## 2015-12-31 NOTE — Progress Notes (Signed)
left for dr Burr Medico to sign

## 2016-01-01 ENCOUNTER — Encounter: Payer: Self-pay | Admitting: Radiation Oncology

## 2016-01-01 ENCOUNTER — Ambulatory Visit
Admission: RE | Admit: 2016-01-01 | Discharge: 2016-01-01 | Disposition: A | Discharge status: Home / Self Care | Payer: 59 | Source: Ambulatory Visit | Attending: Radiation Oncology | Admitting: Radiation Oncology

## 2016-01-01 VITALS — BP 106/68 | HR 71 | Resp 16 | Wt 170.5 lb

## 2016-01-01 DIAGNOSIS — C20 Malignant neoplasm of rectum: Secondary | ICD-10-CM

## 2016-01-01 DIAGNOSIS — Z510 Encounter for antineoplastic radiation therapy: Secondary | ICD-10-CM | POA: Diagnosis not present

## 2016-01-01 NOTE — Progress Notes (Signed)
   Department of Radiation Oncology  Phone:  [phone] Fax:        [phone]  Weekly Treatment Note    Name: Kaitlyn Morgan Date: 01/01/2016 MRN: [id number] DOB: 1965-09-24   Diagnosis:     ICD-9-CM ICD-10-CM   1. Rectal cancer ([facility]) 154.1 C20      Current dose: 5.4 Gy  Current fraction: 3   MEDICATIONS: Current Outpatient Prescriptions  Medication Sig Dispense Refill  . capecitabine (XELODA) 500 MG tablet Take 3 tablets (1,500 mg total) by mouth 2 (two) times daily after a meal. 90 tablet 1  . fluticasone (FLONASE) 50 MCG/ACT nasal spray 1 spray each nostril twice a day 16 g 6  . Loratadine (CLARITIN PO) Take by mouth. Reported on 12/18/2015    . Polyethylene Glycol 3350 (MIRALAX PO) Take 17 g by mouth daily as needed.    . ondansetron (ZOFRAN) 8 MG tablet Take 1 tablet (8 mg total) by mouth every 8 (eight) hours as needed for nausea. (Patient not taking: Reported on 01/01/2016) 30 tablet 3  . PHENTERMINE HCL PO Take 1 capsule by mouth daily as needed. Reported on 01/01/2016     No current facility-administered medications for this encounter.     ALLERGIES: Morphine and related and Zolpidem tartrate   LABORATORY DATA:  Lab Results  Component Value Date   WBC 3.6* 12/18/2015   HGB 13.2 12/18/2015   HCT 41.2 12/18/2015   MCV 88.3 12/18/2015   PLT 259 12/18/2015   Lab Results  Component Value Date   NA 141 12/18/2015   K 4.0 12/18/2015   CO2 26 12/18/2015   Lab Results  Component Value Date   ALT <9 12/18/2015   AST 17 12/18/2015   ALKPHOS 62 12/18/2015   BILITOT 1.12 12/18/2015     NARRATIVE: Joyelle Killins was seen today for weekly treatment management. The chart was checked and the patient's films were reviewed.  Weight and vitals stable. Reports abdominal discomfort and bloating. Reports nausea but, denies vomiting. Reports she began her xeloda on Wednesday. Reports taking miralax today to initiate bowel movement. Denies rectal irritation or  rectal bleeding. States, "Wednesday and Thursday I felt fine but, today I don't feel well at all."  BP 106/68 mmHg  Pulse 71  Resp 16  Wt 170 lb 8 oz (77.338 kg)  SpO2 100% Wt Readings from Last 3 Encounters:  01/01/16 170 lb 8 oz (77.338 kg)  12/18/15 163 lb 1.6 oz (73.982 kg)  12/17/15 158 lb (71.668 kg)     PHYSICAL EXAMINATION: weight is 170 lb 8 oz (77.338 kg). Her blood pressure is 106/68 and her pulse is 71. Her respiration is 16 and oxygen saturation is 100%.        ASSESSMENT: The patient is doing satisfactorily with treatment.  PLAN: We will continue with the patient's radiation treatment as planned. The patient has nausea medicine at home which she will continue as well as MiraLAX for her constipation.

## 2016-01-01 NOTE — Progress Notes (Signed)
Weight and vitals stable. Reports abdominal discomfort and bloating. Reports nausea but, denies vomiting. Reports she began her xeloda on Wednesday. Reports taking miralax today to initiate bowel movement. Denies rectal irritation or rectal bleeding. States, "Wednesday and Thursday I felt fine but, today I don't feel well at all."  BP 106/68 mmHg  Pulse 71  Resp 16  Wt 170 lb 8 oz (77.338 kg)  SpO2 100% Wt Readings from Last 3 Encounters:  01/01/16 170 lb 8 oz (77.338 kg)  12/18/15 163 lb 1.6 oz (73.982 kg)  12/17/15 158 lb (71.668 kg)

## 2016-01-04 ENCOUNTER — Ambulatory Visit
Admission: RE | Admit: 2016-01-04 | Discharge: 2016-01-04 | Disposition: A | Discharge status: Home / Self Care | Payer: 59 | Source: Ambulatory Visit | Attending: Radiation Oncology | Admitting: Radiation Oncology

## 2016-01-04 ENCOUNTER — Other Ambulatory Visit (HOSPITAL_BASED_OUTPATIENT_CLINIC_OR_DEPARTMENT_OTHER): Payer: 59

## 2016-01-04 DIAGNOSIS — C20 Malignant neoplasm of rectum: Secondary | ICD-10-CM

## 2016-01-04 DIAGNOSIS — Z510 Encounter for antineoplastic radiation therapy: Secondary | ICD-10-CM | POA: Diagnosis not present

## 2016-01-04 LAB — COMPREHENSIVE METABOLIC PANEL
ALT: <9 U/L (ref 0–55)
AST: 15 U/L (ref 5–34)
Albumin: 3.5 g/dL (ref 3.5–5.0)
Alkaline Phosphatase: 50 U/L (ref 40–150)
Anion Gap: 6 mEq/L (ref 3–11)
BUN: 9.1 mg/dL (ref 7.0–26.0)
CO2: 27 mEq/L (ref 22–29)
Calcium: 8.7 mg/dL (ref 8.4–10.4)
Chloride: 106 mEq/L (ref 98–109)
Creatinine: 0.8 mg/dL (ref 0.6–1.1)
EGFR: >90 mL/min/{1.73_m2} (ref >90)
Glucose: 81 mg/dl (ref 70–140)
Potassium: 4.3 mEq/L (ref 3.5–5.1)
Sodium: 139 mEq/L (ref 136–145)
Total Bilirubin: 0.75 mg/dL (ref 0.20–1.20)
Total Protein: 6.4 g/dL (ref 6.4–8.3)

## 2016-01-04 LAB — CBC WITH DIFFERENTIAL/PLATELET
BASO%: 0.6 % (ref 0.0–2.0)
Basophils Absolute: 0 10*3/uL (ref 0.0–0.1)
EOS%: 2.9 % (ref 0.0–7.0)
Eosinophils Absolute: 0.1 10*3/uL (ref 0.0–0.5)
HCT: 32.8 % — ABNORMAL LOW (ref 34.8–46.6)
HGB: 11.3 g/dL — ABNORMAL LOW (ref 11.6–15.9)
LYMPH%: 28.7 % (ref 14.0–49.7)
MCH: 30.2 pg (ref 25.1–34.0)
MCHC: 34.5 g/dL (ref 31.5–36.0)
MCV: 87.7 fL (ref 79.5–101.0)
MONO#: 0.4 10*3/uL (ref 0.1–0.9)
MONO%: 10.3 % (ref 0.0–14.0)
NEUT#: 2 10*3/uL (ref 1.5–6.5)
NEUT%: 57.5 % (ref 38.4–76.8)
Platelets: 242 10*3/uL (ref 145–400)
RBC: 3.74 10*6/uL (ref 3.70–5.45)
RDW: 14.2 % (ref 11.2–14.5)
WBC: 3.5 10*3/uL — ABNORMAL LOW (ref 3.9–10.3)
lymph#: 1 10*3/uL (ref 0.9–3.3)

## 2016-01-05 ENCOUNTER — Ambulatory Visit (HOSPITAL_BASED_OUTPATIENT_CLINIC_OR_DEPARTMENT_OTHER): Payer: 59 | Admitting: Nurse Practitioner

## 2016-01-05 ENCOUNTER — Ambulatory Visit
Admission: RE | Admit: 2016-01-05 | Discharge: 2016-01-05 | Disposition: A | Discharge status: Home / Self Care | Payer: 59 | Source: Ambulatory Visit | Attending: Radiation Oncology | Admitting: Radiation Oncology

## 2016-01-05 ENCOUNTER — Encounter: Payer: Self-pay | Admitting: Hematology

## 2016-01-05 VITALS — BP 104/65 | HR 74 | Temp 98.1°F | Resp 20 | Ht 62.0 in | Wt 173.5 lb

## 2016-01-05 DIAGNOSIS — C20 Malignant neoplasm of rectum: Secondary | ICD-10-CM

## 2016-01-05 DIAGNOSIS — Z510 Encounter for antineoplastic radiation therapy: Secondary | ICD-10-CM | POA: Diagnosis not present

## 2016-01-05 NOTE — Progress Notes (Signed)
[place]  [phone] Fax:([phone]) [place] Note   Patient Care Team: Leandrew Koyanagi, MD as PCP - General (Internal Medicine) Truitt Merle, MD as Consulting Physician (Hematology) Kyung Rudd, MD as Consulting Physician (Radiation Oncology) Jackolyn Confer, MD as Consulting Physician (General Surgery) Richmond Campbell, MD as Consulting Physician (Gastroenterology) Milus Banister, MD as Attending Physician (Gastroenterology) 01/05/2016  Referring physician: Dr. Zella Richer   CHIEF COMPLAINTS/PURPOSE OF CONSULTATION:  Newly diagnosed rectal cancer  Oncology History   Rectal cancer [facility])   Staging form: Colon and Rectum, AJCC 7th Edition     Clinical stage from 12/03/2015: Stage IIIB (T3, N1, M0) - Signed by Truitt Merle, MD on 12/18/2015       Rectal cancer ([place])   12/03/2015 Initial Diagnosis Rectal cancer ([place])   12/03/2015 Procedure Colonoscopy showed a partially obstructing tumor in the proximal rectum, its diameter measured 6 mm, biopsed   12/03/2015 Initial Biopsy Rectum mass biopsy showed adenocarcinoma, moderately differentiated   12/10/2015 Imaging CT abdomen and pelvis with contrast showed luminal narrowing and mural thickening in the proximal to mid rectum. There is a 6 mm short axis mesorectal lymph node suspicious for metastasis. There are several tiny indeterminate liver lesions    12/17/2015 Imaging MRI pelvis with and without contrast showed a advanced T3 rectal adenocarcinoma (7cm), N1 disease (at least 2 left-sided nasal rectal lymph nodes are seen measuring 6-7 mm), distance from tumor to the sphincter is 8 cm   12/17/2015 Imaging CT chest with contrast showed no evidence of metastasis or other changes    HISTORY OF PRESENTING ILLNESS:  Kaitlyn Morgan 50 y.o. female is here because of Her newly diagnosed rectal cancer. She is accompanied by her daughter and her mother to our multidisciplinary GI clinic today.  She has had  chronic abdominal pain for 2-3 years, in the epigastric area and low abdomen, which has been worse lately, and she noticed constipation, abdominal bloating and mild rectal bleeding for 2 months. She was referred to Dr. Earlean Shawl and underwent colonoscopy on 12/03/2015. A partial obstructive rectal mass was found in the proximal rectum, biopsy showed adenocarcinoma. She was referred to surgeon Dr. Zella Richer who referred her to [place] to consider new adjuvant chemoradiation.  She feels well overall, has mild fatigue, but able to tolerate her routine full-time job in daily activities without difficulties. She has good appetite, but eats less due to the epigastric pain after meal. She denies any other significant pain, cough, or other symptoms. No recent weight loss. She has been on phentermine for weight loss recently, and stopped a few days ago.   MEDICAL HISTORY:  History reviewed. No pertinent past medical history.  SURGICAL HISTORY: Past Surgical History  Procedure Laterality Date  . Cesarean section    . Abdominal hysterectomy    . Tubal ligation      SOCIAL HISTORY: Social History   Social History  . Marital Status: Legally Separated    Spouse Name: N/A  . Number of Children: N/A  . Years of Education: N/A   Occupational History  . Not on file.   Social History Main Topics  . Smoking status: Never Smoker   . Smokeless tobacco: Never Used  . Alcohol Use: No  . Drug Use: No  . Sexual Activity: Not on file   Other Topics Concern  . Not on file   Social History Narrative   She has 3 children, she lives with her twin sons who are  58. She works for [employer]   FAMILY HISTORY: Family History  Problem Relation Age of Onset  . Cancer Maternal Grandmother 15    breast cancer   . Cancer Paternal Grandmother 55    uterine cancer   . Hypertension Father   . Hypertension Sister   . Hypertension Paternal Grandfather   . Heart attack Paternal Grandfather   . Hypertension  Sister     ALLERGIES:  is allergic to morphine and related and zolpidem tartrate.  MEDICATIONS:  Current Outpatient Prescriptions  Medication Sig Dispense Refill  . capecitabine (XELODA) 500 MG tablet Take 3 tablets (1,500 mg total) by mouth 2 (two) times daily after a meal. 90 tablet 1  . fluticasone (FLONASE) 50 MCG/ACT nasal spray 1 spray each nostril twice a day 16 g 6  . Loratadine (CLARITIN PO) Take by mouth. Reported on 12/18/2015    . ondansetron (ZOFRAN) 8 MG tablet Take 1 tablet (8 mg total) by mouth every 8 (eight) hours as needed for nausea. (Patient not taking: Reported on 01/01/2016) 30 tablet 3  . PHENTERMINE HCL PO Take 1 capsule by mouth daily as needed. Reported on 01/01/2016    . Polyethylene Glycol 3350 (MIRALAX PO) Take 17 g by mouth daily as needed.     No current facility-administered medications for this visit.    REVIEW OF SYSTEMS:   Constitutional: Denies fevers, chills or abnormal night sweats Eyes: Denies blurriness of vision, double vision or watery eyes Ears, nose, mouth, throat, and face: Denies mucositis or sore throat Respiratory: Denies cough, dyspnea or wheezes Cardiovascular: Denies palpitation, chest discomfort or lower extremity swelling Gastrointestinal:  Denies nausea, heartburn or change in bowel habits Skin: Denies abnormal skin rashes Lymphatics: Denies new lymphadenopathy or easy bruising Neurological:Denies numbness, tingling or new weaknesses Behavioral/Psych: Mood is stable, no new changes  All other systems were reviewed with the patient and are negative.  PHYSICAL EXAMINATION: ECOG PERFORMANCE STATUS: 1 - Symptomatic but completely ambulatory  There were no vitals filed for this visit. There were no vitals filed for this visit.  GENERAL:alert, no distress and comfortable SKIN: skin color, texture, turgor are normal, no rashes or significant lesions EYES: normal, conjunctiva are pink and non-injected, sclera clear OROPHARYNX:no  exudate, no erythema and lips, buccal mucosa, and tongue normal  NECK: supple, thyroid normal size, non-tender, without nodularity LYMPH:  no palpable lymphadenopathy in the cervical, axillary or inguinal LUNGS: clear to auscultation and percussion with normal breathing effort HEART: regular rate & rhythm and no murmurs and no lower extremity edema ABDOMEN:abdomen soft, non-tender and normal bowel sounds Musculoskeletal:no cyanosis of digits and no clubbing  PSYCH: alert & oriented x 3 with fluent speech NEURO: no focal motor/sensory deficits  LABORATORY DATA:  I have reviewed the data as listed Lab Results  Component Value Date   WBC 3.5* 01/04/2016   HGB 11.3* 01/04/2016   HCT 32.8* 01/04/2016   MCV 87.7 01/04/2016   PLT 242 01/04/2016    Recent Labs  12/18/15 1140 01/04/16 1527  NA 141 139  K 4.0 4.3  CO2 26 27  GLUCOSE 80 81  BUN 9.1 9.1  CREATININE 0.8 0.8  CALCIUM 9.4 8.7  PROT 7.4 6.4  ALBUMIN 4.2 3.5  AST 17 15  ALT <9 <9  ALKPHOS 62 50  BILITOT 1.12 0.75    PATHOLOGY REPORT Diagnosis 12/03/2015 Colon, sigmoid, mass, biopsy -Invasive adenocarcinoma, moderately differentiated. See comment.  Comment: due to the presence of adenocarcinoma, IHC for DNA  mismatch repair proteins will be performed and reported in an addendum.   RADIOGRAPHIC STUDIES: I have personally reviewed the radiological images as listed and agreed with the findings in the report. Ct Chest W Contrast  12/17/2015  CLINICAL DATA:  Newly diagnosed rectal carcinoma.  Staging. EXAM: CT CHEST WITH CONTRAST TECHNIQUE: Multidetector CT imaging of the chest was performed during intravenous contrast administration. CONTRAST:  75 mL Isovue-300 COMPARISON:  AP CT on 12/10/2015 FINDINGS: Mediastinum/Lymph Nodes: No masses, pathologically enlarged lymph nodes, or other significant abnormality. Lungs/Pleura: No pulmonary mass, infiltrate, or effusion. Upper abdomen: Several tiny sub-cm low-attenuation liver  lesions noted, as seen on recent abdomen CT of 12/10/2015 ; see this prior separate report. Musculoskeletal: No chest wall mass or suspicious bone lesions identified. Mild thoracic spine degenerative changes noted. IMPRESSION: No evidence of metastatic disease or other acute findings within the thorax. Electronically Signed   By: Earle Gell M.D.   On: 12/17/2015 14:53   Mr Pelvis W Wo Contrast  12/17/2015  CLINICAL DATA:  Newly diagnosed rectal carcinoma. EXAM: MRI PELVIS WITHOUT AND WITH CONTRAST TECHNIQUE: Multiplanar multisequence MR imaging of the pelvis was performed both before and after administration of intravenous contrast. Small amount of [place] gel was administered per rectum to optimize tumor evaluation. CONTRAST:  36m MULTIHANCE GADOBENATE DIMEGLUMINE 529 MG/ML IV SOLN COMPARISON:  CT on 12/10/2015 FINDINGS: TUMOR LOCATION Location from Anal Verge:  Mid to upper rectum Shortest Distance from Tumor to Anal Sphincter:  8 cm TUMOR DESCRIPTION Circumferential Extent: Completely circumferential, annular, and constricting Craniocaudal Extent:  Approximately 7 cm T - CATEGORY Extension through Muscularis Propria:  Yes = T3 Maximum extension beyond Muscularis Propria:  15 mm (early T3 = <510m advanced T3 = >36m73mExtramural vascular invasion/tumor thrombus:  None visualized Shortest distance of any tumor/node from Mesorectal Fascia: Several perirectal lymph nodes are seen, with posterior/presacral perirectal lymph node measuring approximately 3 mm from the mesorectal fascia on image 22 of series 4 Invasion of Anterior Peritoneal Reflection:  No Involvement of Adjacent Organs or Pelvic Sidewall Structures:  No N - CATEGORY Mesorectal Lymph Nodes >=36mm56mes, at least 2 left-sided nasal rectal lymph nodes are seen measuring 6-7 mm. Other tiny less than 5 minute Mayer ease of rectal lymph nodes are also seen. Extra-mesorectal Lymphadenopathy:  No Other: Prior hysterectomy noted. Both ovaries are normal in  appearance. IMPRESSION: Rectal adenocarcinoma T stage:  Advanced T3 Rectal adenocarcinoma N stage:  N1 Distance from tumor to the anal sphincter is 8 cm. Electronically Signed   By: JohnEarle Gell.   On: 12/17/2015 16:02   Ct Abdomen Pelvis W Contrast  12/10/2015  CLINICAL DATA:  49 y23r old female with history of rectal cancer diagnosed on 12/03/2015. EXAM: CT ABDOMEN AND PELVIS WITH CONTRAST TECHNIQUE: Multidetector CT imaging of the abdomen and pelvis was performed using the standard protocol following bolus administration of intravenous contrast. CONTRAST:  100mL39mVUE-300 IOPAMIDOL (ISOVUE-300) INJECTION 61% COMPARISON:  CT the abdomen and pelvis 05/24/2006. FINDINGS: Lower chest:  Unremarkable. Hepatobiliary: Four tiny sub cm low-attenuation lesions are noted in the liver, too small to characterize. One of these in segment 7 (image 14 of series 2) may have been present on remote prior study 05/24/2006. The others cannot be confirmed on the prior examination. No larger more suspicious appearing hepatic lesions are noted. No intra or extrahepatic biliary ductal dilatation. Gallbladder is normal in appearance. Pancreas: No pancreatic mass. No pancreatic ductal dilatation. No pancreatic or peripancreatic fluid or inflammatory changes. Spleen: Sub  cm low-attenuation lesion in the lower pole of the spleen is incompletely characterized, but has a benign appearance, likely a tiny cyst. Adrenals/Urinary Tract: 5 mm nonobstructive calculus in the lower pole collecting system of the left kidney. Sub cm low-attenuation lesions in the right kidney are too small to definitively characterize, but are statistically likely tiny cysts. Bilateral adrenal glands are normal in appearance. No hydroureteronephrosis. Urinary bladder is normal in appearance. Stomach/Bowel: Normal appearance of the stomach. No pathologic dilatation of small bowel or colon. In the proximal to mid rectum there is a focal area of luminal narrowing and  mild mural thickening, which likely corresponds to the recently biopsy-proven neoplasm. This is best appreciated on axial image 72 of series 2, coronal image 57 of series 3, and sagittal image 57 of series 4. Normal appendix. Vascular/Lymphatic: No significant atherosclerotic disease, aneurysm or dissection identified in the abdominal or pelvic vasculature. 6 mm left-sided mesorectal lymph node (image 70 of series 2) is nonspecific. No other lymphadenopathy noted in the abdomen or pelvis. Reproductive: Status post hysterectomy.  Ovaries are atrophic. Other: No significant volume of ascites.  No pneumoperitoneum. Musculoskeletal: There are no aggressive appearing lytic or blastic lesions noted in the visualized portions of the skeleton. IMPRESSION: 1. Area of luminal narrowing and mural thickening in the proximal to mid rectum, as discussed above, which may correspond to the recently biopsy-proven rectal neoplasm. Adjacent to this there is a nonenlarged but conspicuous 6 mm short axis mesorectal lymph node (image 70 of series 2). No other definite findings to suggest metastatic disease in the abdomen or pelvis. However, there are several tiny indeterminate sub cm liver lesions. While these would typically be favored to be small cysts, in the setting of a primary malignancy, further characterization with MRI of the abdomen with and without IV gadolinium is recommended to exclude the possibility of tiny hepatic metastases. 2. 5 mm nonobstructive calculus in the lower pole collecting system of the left kidney. No ureteral stones or findings of urinary tract obstruction are noted at this time. 3. Additional incidental findings, as above Electronically Signed   By: Vinnie Langton M.D.   On: 12/10/2015 11:13   Mr Liver W Wo Contrast  12/18/2015  CLINICAL DATA:  Recently diagnosed rectal cancer. Multiple liver lesions. EXAM: MRI ABDOMEN WITHOUT AND WITH CONTRAST TECHNIQUE: Multiplanar multisequence MR imaging of the  abdomen was performed both before and after the administration of intravenous contrast. CONTRAST:  46m MULTIHANCE GADOBENATE DIMEGLUMINE 529 MG/ML IV SOLN COMPARISON:  12/10/2015 FINDINGS: Lower chest:  Unremarkable Hepatobiliary: The several tiny hepatic hypodensities shown on CTA demonstrate no appreciable enhancement on MRI. The most sharply defined of these measures 4 mm in the dome of segment 7 on image 23 series 1205. The other lesions are volume averaged because of the tiny 2-3 mm size, and accordingly somewhat nonspecific even on subtraction MRI, but generally reassuring given the very high T2 signal that these are tiny cysts. Contracted gallbladder. Pancreas: Unremarkable Spleen: Benign 4 mm peripheral cystic lesion in the spleen, nonenhancing, image 22 series 1204. Similar lesion along the inferior splenic margin measuring 7 mm on image 41 of series 1205. Both lesions have fluid signal intensity. Adrenals/Urinary Tract: Several tiny right mid kidney cysts. These appear benign. Adrenal glands normal. Stomach/Bowel: Unremarkable in the upper abdomen. Vascular/Lymphatic: Unremarkable Other: No supplemental non-categorized findings. Musculoskeletal: Schmorl's node along the superior endplate of L2. Mild thoracic spondylosis. IMPRESSION: 1. Multiple tiny cysts in the liver, spleen, and right mid kidney. In  general these have benign imaging characteristics. Some are in the 2-3 mm range in the liver, causing volume averaging. As such, while I feel these should be treated as benign, periodic surveillance might be prudent. Electronically Signed   By: Van Clines M.D.   On: 12/18/2015 16:07   COLONOSCOPY DR. MEDOFF 12/03/2015 Impression Malignant partially obstructing tumor in the proximal rectum, its diameter measured 6 mm, biopsy. Distal margin at 15 cm.    ASSESSMENT & PLAN:  50 year old Caucasian female, without significant past medical history, presented with abdominal pain and rectal bleeding.  Colonoscopy showed a obstructing tumor in the proximal rectum.  1. Rectal adenocarcinoma, proximal rectum, cT3N1M0, stage IIIB, moderately differentiated --I have reviewed her colonoscopy, EUS, MRI of pelvis, CT of abdomen pelvis finding with pt and her family in details -The pelvic MRI showed a T3 lesion, 2 suspicious lymph nodes, likely state IIIB disease. She also was found to have multiple small indeterminate hypodense lesion in the liver, which was not found on her prior CT scan in 2007. We will order a abdominal MRI to further look into the liver lesions.  -CT chest was negative for distant metastasis. -We reviewed the natural history of rectal cancer, risk of recurrence after surgery, depending on the stage. We discussed the treatment option for stage IIIB rectal cancer. The standard of care is neoadjuvant chemotherapy and radiation, followed by surgery, then adjuvant chemotherapy. -I discussed the option of continuous 5-FU infusion and Xeloda with concurrent radiation. The potential side effects and benefits were discussed with patient. She opted Xeloda.prescription of xeloda 1580m twice daily will be sent out today  --Chemotherapy consent: Side effects including but does not not limited to, fatigue, nausea, vomiting, diarrhea, hair loss, neuropathy, fluid retention, renal and kidney dysfunction, neutropenic fever, needed for blood transfusion, bleeding, coronary artery spasm and heart attack, were discussed with patient in great detail. She agrees to proceed. -She was seen by a radiation oncologist Dr. MLisbeth Renshawtoday. She will start radiation on May 15 -I will see her back on her second week of treatment, 5/23, sooner if her liver MRI is abnormal.  -Dietitian and physical therapy referral  Plan -Lab today, including CBC, CMP, CEA and ferritin -Abdominal MRI with and without contrast as soon as possible -She is scheduled to start concurrent chemoradiation on May 15 -[place] -I'll see her back on May 23   No orders of the defined types were placed in this encounter.    All questions were answered. The patient knows to call the clinic with any problems, questions or concerns. I spent 55 minutes counseling the patient face to face. The total time spent in the appointment was 60 minutes and more than 50% was on counseling.     FTruitt Merle MD 01/05/2016 7:54 AM

## 2016-01-05 NOTE — Progress Notes (Signed)
  [place] OFFICE PROGRESS NOTE   Diagnosis:  Rectal cancer Oncology History   Rectal cancer [facility])  Staging form: Colon and Rectum, AJCC 7th Edition  Clinical stage from 12/03/2015: Stage IIIB (T3, N1, M0) - Signed by Truitt Merle, MD on 12/18/2015       Rectal cancer ([place])   12/03/2015 Initial Diagnosis Rectal cancer ([place])   12/03/2015 Procedure Colonoscopy showed a partially obstructing tumor in the proximal rectum, its diameter measured 6 cm, biopsed   12/03/2015 Initial Biopsy Rectum mass biopsy showed adenocarcinoma, moderately differentiated   12/10/2015 Imaging CT abdomen and pelvis with contrast showed luminal narrowing and mural thickening in the proximal to mid rectum. There is a 6 mm short axis mesorectal lymph node suspicious for metastasis. There are several tiny indeterminate liver lesions    12/17/2015 Imaging MRI pelvis with and without contrast showed a advanced T3 rectal adenocarcinoma (7cm), N1 disease (at least 2 left-sided nasal rectal lymph nodes are seen measuring 6-7 mm), distance from tumor to the sphincter is 8 cm   12/17/2015 Imaging CT chest with contrast showed no evidence of metastasis or other changes        INTERVAL HISTORY:   Ms. Caruso returns as scheduled. She began radiation/Xeloda 12/30/2015. Around day 3 she developed nausea and constipation. Several days prior she had resumed a regular diet (previously diet mainly consisted of liquids) due to instructions to take Xeloda following a meal. She had also stopped Miralax when she began Xeloda due to concern she would develop diarrhea. She continues to have intermittent nausea. No vomiting. Bowels moving. She describes the output as "tissue". She notes less bleeding. No significant rectal pain. No hand or foot pain or redness. No mouth sores.  Objective:  Vital signs in last 24 hours:  Blood pressure 104/65, pulse 74, temperature 98.1 F (36.7 C), temperature  source Oral, resp. rate 20, height 5\' 2"  (1.575 m), weight 173 lb 8 oz (78.699 kg), SpO2 100 %.    HEENT: No thrush or ulcers. Resp: Lungs clear bilaterally. Cardio: Regular rate and rhythm. GI: Abdomen soft and nontender. No hepatomegaly. Vascular: No leg edema. Calves soft and nontender. Skin: Palms without erythema.    Lab Results:  Lab Results  Component Value Date   WBC 3.5* 01/04/2016   HGB 11.3* 01/04/2016   HCT 32.8* 01/04/2016   MCV 87.7 01/04/2016   PLT 242 01/04/2016   NEUTROABS 2.0 01/04/2016    Imaging:  No results found.  Medications: I have reviewed the patient's current medications.  Assessment/Plan: 1. Rectal adenocarcinoma, proximal rectum, cT3N1M0, stage IIIB, moderately differentiated. Initiation of radiation and Xeloda 12/30/2015.   Disposition: Ms. Wissner appears stable. She began concurrent Xeloda/radiation 12/30/2015. She has had recent nausea and constipation. She will resume a laxative regimen (miralax twice a day for the next 2 days, then daily and Senokot-S 2 tablets twice daily). She will also resume a low residue diet. We discussed the importance of adequate fluid intake as well. She understands to contact the office with nausea/vomiting, rectal pain, continued constipation.  She will return for a follow-up visit in approximately 1 week.  Ned Card ANP/GNP-BC   01/05/2016  4:46 PM

## 2016-01-06 ENCOUNTER — Other Ambulatory Visit: Payer: Self-pay | Admitting: Nurse Practitioner

## 2016-01-06 ENCOUNTER — Ambulatory Visit
Admission: RE | Admit: 2016-01-06 | Discharge: 2016-01-06 | Disposition: A | Discharge status: Home / Self Care | Payer: 59 | Source: Ambulatory Visit | Attending: Radiation Oncology | Admitting: Radiation Oncology

## 2016-01-06 ENCOUNTER — Telehealth: Payer: Self-pay | Admitting: Hematology

## 2016-01-06 ENCOUNTER — Encounter: Payer: Self-pay | Admitting: Hematology

## 2016-01-06 DIAGNOSIS — Z510 Encounter for antineoplastic radiation therapy: Secondary | ICD-10-CM | POA: Diagnosis not present

## 2016-01-06 NOTE — Progress Notes (Signed)
left in box-fmla - left for dr Burr Medico to sign-faxed  [phone] and left copy for patient at front with ms wilma and sent to medical recrds

## 2016-01-06 NOTE — Telephone Encounter (Signed)
per pof to sch pt appt-*cld pt and left a mesage of time & date of appt

## 2016-01-07 ENCOUNTER — Ambulatory Visit: Payer: 59 | Admitting: Nutrition

## 2016-01-07 ENCOUNTER — Ambulatory Visit
Admission: RE | Admit: 2016-01-07 | Discharge: 2016-01-07 | Disposition: A | Discharge status: Home / Self Care | Payer: 59 | Source: Ambulatory Visit | Attending: Radiation Oncology | Admitting: Radiation Oncology

## 2016-01-07 DIAGNOSIS — Z510 Encounter for antineoplastic radiation therapy: Secondary | ICD-10-CM | POA: Diagnosis not present

## 2016-01-07 NOTE — Progress Notes (Signed)
Received referral to educate patient on low fiber diet. Contacted patient by phone and educated her on foods recommended and foods to avoid. Questions were answered.  Teach back method used. I will leave low fiber diet Fact sheet with my contact information with her radiation therapist.   She will pick this up today when she comes in for radiation treatments.

## 2016-01-08 ENCOUNTER — Ambulatory Visit
Admission: RE | Admit: 2016-01-08 | Discharge: 2016-01-08 | Disposition: A | Discharge status: Home / Self Care | Payer: 59 | Source: Ambulatory Visit | Attending: Radiation Oncology | Admitting: Radiation Oncology

## 2016-01-08 ENCOUNTER — Telehealth: Payer: Self-pay

## 2016-01-08 ENCOUNTER — Encounter: Payer: Self-pay | Admitting: Radiation Oncology

## 2016-01-08 VITALS — BP 103/60 | HR 72 | Temp 98.0°F | Ht 62.0 in | Wt 170.3 lb

## 2016-01-08 DIAGNOSIS — C20 Malignant neoplasm of rectum: Secondary | ICD-10-CM

## 2016-01-08 DIAGNOSIS — Z510 Encounter for antineoplastic radiation therapy: Secondary | ICD-10-CM | POA: Diagnosis not present

## 2016-01-08 NOTE — Progress Notes (Addendum)
Kaitlyn Morgan reports constipation which is relieved with Senokot resulting in "slightly" loose stools. Denies any proctitis. Denies any changes or dysuria urinating.  Low residue.    Education today.  Pt here for patient teaching.  Pt given Radiation and You booklet and skin care instructions. Pt reports they have  the Radiation Therapy Education video on.  Reviewed areas of pertinence such as diarrhea, fatigue, nausea and vomiting, skin changes, urinary and bladder changes, blurry vision, breast tenderness, breast swelling, cough, shortness of breath, earaches and taste changes . Pt able to give teach back of to pat skin, use unscented/gentle soap, use baby wipes, have Imodium on hand, drink plenty of water and sitz bath,avoid applying anything to skin within 4 hours of treatment. Pt demonstrated understanding and verbalizes understanding of information given and will contact nursing with any questions or concerns.  Will reassess if seen video   [email or web address]/treatmentinformation/whattoexpect/index

## 2016-01-08 NOTE — Addendum Note (Signed)
Encounter addended by: Benn Moulder, RN on: 01/08/2016  6:40 PM<BR>     Documentation filed: Inpatient Patient Education, Notes Section

## 2016-01-08 NOTE — Progress Notes (Signed)
   Department of Radiation Oncology  Phone:  [phone] Fax:        [phone]  Weekly Treatment Note    Name: Kaitlyn Morgan Date: 01/08/2016 MRN: [id number] DOB: 12/03/1965   Diagnosis:     ICD-9-CM ICD-10-CM   1. Rectal cancer (HCC) 154.1 C20      Current dose: 14.4 Gy  Current fraction: 8   MEDICATIONS: Current Outpatient Prescriptions  Medication Sig Dispense Refill  . capecitabine (XELODA) 500 MG tablet Take 3 tablets (1,500 mg total) by mouth 2 (two) times daily after a meal. 90 tablet 1  . Polyethylene Glycol 3350 (MIRALAX PO) Take 17 g by mouth daily as needed.    . senna-docusate (SENOKOT S) 8.6-50 MG tablet Take 2 tablets by mouth at bedtime.    . fluticasone (FLONASE) 50 MCG/ACT nasal spray 1 spray each nostril twice a day (Patient not taking: Reported on 01/08/2016) 16 g 6  . Loratadine (CLARITIN PO) Take by mouth. Reported on 01/08/2016    . ondansetron (ZOFRAN) 8 MG tablet Take 1 tablet (8 mg total) by mouth every 8 (eight) hours as needed for nausea. (Patient not taking: Reported on 01/05/2016) 30 tablet 3   No current facility-administered medications for this encounter.     ALLERGIES: Morphine and related and Zolpidem tartrate   LABORATORY DATA:  Lab Results  Component Value Date   WBC 3.5* 01/04/2016   HGB 11.3* 01/04/2016   HCT 32.8* 01/04/2016   MCV 87.7 01/04/2016   PLT 242 01/04/2016   Lab Results  Component Value Date   NA 139 01/04/2016   K 4.3 01/04/2016   CO2 27 01/04/2016   Lab Results  Component Value Date   ALT <9 01/04/2016   AST 15 01/04/2016   ALKPHOS 50 01/04/2016   BILITOT 0.75 01/04/2016     NARRATIVE: [name] was seen today for weekly treatment management. The chart was checked and the patient's films were reviewed.  [name] reports constipation which is relieved with Senokot resulting in "slightly" loose stools. Denies any proctitis. Denies any changes or dysuria urinating.  Low residue.     Education today.  PHYSICAL EXAMINATION: height is 5\' 2"  (1.575 m) and weight is 170 lb 4.8 oz (77.248 kg). Her temperature is 98 F (36.7 C). Her blood pressure is 103/60 and her pulse is 72.        ASSESSMENT: The patient is doing satisfactorily with treatment.  PLAN: We will continue with the patient's radiation treatment as planned.

## 2016-01-08 NOTE — Telephone Encounter (Signed)
.  Oral Chemotherapy Follow-Up Form  Original Start date of oral chemotherapy: _ Dec 30, 2015   Called patient today to follow up regarding patient's oral chemotherapy medication: Xeloda  Pt is doing well today main complaint is nausea.  Patient is unsure if nausea is due to Xeloda or the fact that she is regularly constipated and took Senokot at bedtime and woke up nauseated.  Advised patient to continue to take the Xeloda with food, but may want to change types of foods to include:  Dry toast, scrambled eggs, oatmeal, etc to decrease fat/acidity on the stomach that may alleviate the nausea.  Also to keep crackers etc and when begins to feel slightly nauseated to consume some crackers.  Patient has zofran at home but cannot take much of it due to side effect of constipation, which only makes her problems worse.  She is compliant with her doses despite the nausea.  Patient also expressed that she purchased some ginger lozenges in hopes this would relieve some of the nausea.  Patient also contacted [facility] about her next refill.  She has 10+ days of drug left and they could not fill prescription yet, so she was advised to call back on Jan 12, 2016 to request a refill.   Reminded patient to contact us if she has any issues with her refill or if she has any concerns or needs.  Pt reports __0__ tablets/doses missed in the last week/month.  Missed dose(s) attributed to: ___NA_________  Pt reports the following side effects: _ nausea/constipation  Other Issues: _________   Will follow up and call patient again in ___2 weeks____   Thank you,  Henreitta Leber, PharmD [facility]

## 2016-01-12 ENCOUNTER — Other Ambulatory Visit: Payer: Self-pay | Admitting: Hematology

## 2016-01-12 ENCOUNTER — Ambulatory Visit
Admission: RE | Admit: 2016-01-12 | Discharge: 2016-01-12 | Disposition: A | Discharge status: Home / Self Care | Payer: 59 | Source: Ambulatory Visit | Attending: Radiation Oncology | Admitting: Radiation Oncology

## 2016-01-12 ENCOUNTER — Ambulatory Visit (HOSPITAL_BASED_OUTPATIENT_CLINIC_OR_DEPARTMENT_OTHER): Payer: 59 | Admitting: Hematology

## 2016-01-12 ENCOUNTER — Other Ambulatory Visit (HOSPITAL_BASED_OUTPATIENT_CLINIC_OR_DEPARTMENT_OTHER): Payer: 59

## 2016-01-12 ENCOUNTER — Other Ambulatory Visit: Payer: Self-pay | Admitting: *Deleted

## 2016-01-12 ENCOUNTER — Telehealth: Payer: Self-pay | Admitting: *Deleted

## 2016-01-12 ENCOUNTER — Encounter: Payer: Self-pay | Admitting: Hematology

## 2016-01-12 VITALS — BP 113/69 | HR 83 | Temp 97.9°F | Resp 18 | Ht 62.0 in | Wt 172.5 lb

## 2016-01-12 DIAGNOSIS — C20 Malignant neoplasm of rectum: Secondary | ICD-10-CM

## 2016-01-12 DIAGNOSIS — K5900 Constipation, unspecified: Secondary | ICD-10-CM

## 2016-01-12 DIAGNOSIS — R110 Nausea: Secondary | ICD-10-CM | POA: Diagnosis not present

## 2016-01-12 DIAGNOSIS — R350 Frequency of micturition: Secondary | ICD-10-CM

## 2016-01-12 DIAGNOSIS — Z510 Encounter for antineoplastic radiation therapy: Secondary | ICD-10-CM | POA: Diagnosis not present

## 2016-01-12 LAB — COMPREHENSIVE METABOLIC PANEL
ALT: <9 U/L (ref 0–55)
AST: 15 U/L (ref 5–34)
Albumin: 3.6 g/dL (ref 3.5–5.0)
Alkaline Phosphatase: 55 U/L (ref 40–150)
Anion Gap: 7 mEq/L (ref 3–11)
BUN: 7.4 mg/dL (ref 7.0–26.0)
CO2: 27 mEq/L (ref 22–29)
Calcium: 9 mg/dL (ref 8.4–10.4)
Chloride: 108 mEq/L (ref 98–109)
Creatinine: 0.7 mg/dL (ref 0.6–1.1)
EGFR: >90 mL/min/{1.73_m2} (ref >90)
Glucose: 88 mg/dl (ref 70–140)
Potassium: 4.1 mEq/L (ref 3.5–5.1)
Sodium: 143 mEq/L (ref 136–145)
Total Bilirubin: 0.71 mg/dL (ref 0.20–1.20)
Total Protein: 6.8 g/dL (ref 6.4–8.3)

## 2016-01-12 LAB — CBC WITH DIFFERENTIAL/PLATELET
BASO%: 0.7 % (ref 0.0–2.0)
Basophils Absolute: 0 10*3/uL (ref 0.0–0.1)
EOS%: 7.5 % — ABNORMAL HIGH (ref 0.0–7.0)
Eosinophils Absolute: 0.2 10*3/uL (ref 0.0–0.5)
HCT: 35.1 % (ref 34.8–46.6)
HGB: 11.7 g/dL (ref 11.6–15.9)
LYMPH%: 20.3 % (ref 14.0–49.7)
MCH: 30.2 pg (ref 25.1–34.0)
MCHC: 33.2 g/dL (ref 31.5–36.0)
MCV: 91.1 fL (ref 79.5–101.0)
MONO#: 0.3 10*3/uL (ref 0.1–0.9)
MONO%: 9.1 % (ref 0.0–14.0)
NEUT#: 2.1 10*3/uL (ref 1.5–6.5)
NEUT%: 62.4 % (ref 38.4–76.8)
Platelets: 204 10*3/uL (ref 145–400)
RBC: 3.86 10*6/uL (ref 3.70–5.45)
RDW: 15.2 % — ABNORMAL HIGH (ref 11.2–14.5)
WBC: 3.3 10*3/uL — ABNORMAL LOW (ref 3.9–10.3)
lymph#: 0.7 10*3/uL — ABNORMAL LOW (ref 0.9–3.3)

## 2016-01-12 MED ORDER — PROCHLORPERAZINE MALEATE 10 MG PO TABS: 10.0000 mg | ORAL_TABLET | Freq: Three times a day (TID) | ORAL | Status: DC | PRN

## 2016-01-12 NOTE — Progress Notes (Signed)
[place]  Telephone:([phone]) [phone] Fax:(336) [phone]  Clinic follow Up Note   Patient Care Team: Leandrew Koyanagi, MD as PCP - General (Internal Medicine) Truitt Merle, MD as Consulting Physician (Hematology) Kyung Rudd, MD as Consulting Physician (Radiation Oncology) Jackolyn Confer, MD as Consulting Physician (General Surgery) Richmond Campbell, MD as Consulting Physician (Gastroenterology) Milus Banister, MD as Attending Physician (Gastroenterology) 01/12/2016   CHIEF COMPLAINTS:  Follow up rectal cancer  Oncology History   Rectal cancer [facility])   Staging form: Colon and Rectum, AJCC 7th Edition     Clinical stage from 12/03/2015: Stage IIIB (T3, N1, M0) - Signed by Truitt Merle, MD on 12/18/2015       Rectal cancer ([place])   12/03/2015 Initial Diagnosis Rectal cancer ([place])   12/03/2015 Procedure Colonoscopy showed a partially obstructing tumor in the proximal rectum, its diameter measured 6 mm, biopsed   12/03/2015 Initial Biopsy Rectum mass biopsy showed adenocarcinoma, moderately differentiated   12/10/2015 Imaging CT abdomen and pelvis with contrast showed luminal narrowing and mural thickening in the proximal to mid rectum. There is a 6 mm short axis mesorectal lymph node suspicious for metastasis. There are several tiny indeterminate liver lesions    12/17/2015 Imaging MRI pelvis with and without contrast showed a advanced T3 rectal adenocarcinoma (7cm), N1 disease (at least 2 left-sided nasal rectal lymph nodes are seen measuring 6-7 mm), distance from tumor to the sphincter is 8 cm   12/17/2015 Imaging CT chest with contrast showed no evidence of metastasis or other changes    HISTORY OF PRESENTING ILLNESS:  Kaitlyn Morgan 50 y.o. female is here because of Her newly diagnosed rectal cancer. She is accompanied by her daughter and her mother to our multidisciplinary GI clinic today.  She has had chronic abdominal pain for 2-3 years, in the epigastric area and low  abdomen, which has been worse lately, and she noticed constipation, abdominal bloating and mild rectal bleeding for 2 months. She was referred to Dr. Earlean Shawl and underwent colonoscopy on 12/03/2015. A partial obstructive rectal mass was found in the proximal rectum, biopsy showed adenocarcinoma. She was referred to surgeon Dr. Zella Richer who referred her to [place] to consider new adjuvant chemoradiation.  She feels well overall, has mild fatigue, but able to tolerate her routine full-time job in daily activities without difficulties. She has good appetite, but eats less due to the epigastric pain after meal. She denies any other significant pain, cough, or other symptoms. No recent weight loss. She has been on phentermine for weight loss recently, and stopped a few days ago.  CURRENT THERAPY: concurrent chemotherapy and irradiation with Xeloda 1500 mg twice daily Monday through Friday, started on 12/30/2015  INTERIM HISTORY:  Mrs Slager returns for follow up. She has started chemoradiation on 12/30/2015. She is tolerating moderately well. She had issues with constipation and nausea last week,  she has tried MiraLAX and senna. She feels her nausea could be related to constipation also, she is afraid to take Zofran which can cause constipation. She had sore throat over the weekend, no fever or chills, resolved in a few days. No other new complaints. She gained some weight last week, possible related to fluid retention, improved this week. She also has mild lower abdominal discomfort, intermittent,and  Mild urinary urgency and frequency in the past week. No hematuria or dysuria. Due to her multiple phone calls about the above symptoms, I saw her in my clinic today.    MEDICAL HISTORY:  No  past medical history on file.  SURGICAL HISTORY: Past Surgical History  Procedure Laterality Date  . Cesarean section    . Abdominal hysterectomy    . Tubal ligation      SOCIAL HISTORY: Social History   Social  History  . Marital Status: Legally Separated    Spouse Name: N/A  . Number of Children: N/A  . Years of Education: N/A   Occupational History  . Not on file.   Social History Main Topics  . Smoking status: Never Smoker   . Smokeless tobacco: Never Used  . Alcohol Use: No  . Drug Use: No  . Sexual Activity: Not on file   Other Topics Concern  . Not on file   Social History Narrative   She has 3 children, she lives with her twin sons who are 14. She works for [employer]   FAMILY HISTORY: Family History  Problem Relation Age of Onset  . Cancer Maternal Grandmother 36    breast cancer   . Cancer Paternal Grandmother 36    uterine cancer   . Hypertension Father   . Hypertension Sister   . Hypertension Paternal Grandfather   . Heart attack Paternal Grandfather   . Hypertension Sister     ALLERGIES:  is allergic to morphine and related and zolpidem tartrate.  MEDICATIONS:  Current Outpatient Prescriptions  Medication Sig Dispense Refill  . capecitabine (XELODA) 500 MG tablet Take 3 tablets (1,500 mg total) by mouth 2 (two) times daily after a meal. 90 tablet 1  . fexofenadine (ALLEGRA) 180 MG tablet Take 180 mg by mouth daily.    . Polyethylene Glycol 3350 (MIRALAX PO) Take 17 g by mouth daily as needed.    . prochlorperazine (COMPAZINE) 10 MG tablet Take 1 tablet (10 mg total) by mouth every 8 (eight) hours as needed for nausea. 20 tablet 2  . senna-docusate (SENOKOT S) 8.6-50 MG tablet Take 2 tablets by mouth at bedtime.    . fluticasone (FLONASE) 50 MCG/ACT nasal spray 1 spray each nostril twice a day (Patient not taking: Reported on 01/08/2016) 16 g 6  . Loratadine (CLARITIN PO) Take by mouth. Reported on 01/12/2016    . ondansetron (ZOFRAN) 8 MG tablet Take 1 tablet (8 mg total) by mouth every 8 (eight) hours as needed for nausea. (Patient not taking: Reported on 01/12/2016) 30 tablet 3   No current facility-administered medications for this visit.    REVIEW  OF SYSTEMS:   Constitutional: Denies fevers, chills or abnormal night sweats Eyes: Denies blurriness of vision, double vision or watery eyes Ears, nose, mouth, throat, and face: Denies mucositis or sore throat Respiratory: Denies cough, dyspnea or wheezes Cardiovascular: Denies palpitation, chest discomfort or lower extremity swelling Gastrointestinal:  Denies nausea, heartburn or change in bowel habits Skin: Denies abnormal skin rashes Lymphatics: Denies new lymphadenopathy or easy bruising Neurological:Denies numbness, tingling or new weaknesses Behavioral/Psych: Mood is stable, no new changes  All other systems were reviewed with the patient and are negative.  PHYSICAL EXAMINATION: ECOG PERFORMANCE STATUS: 1 - Symptomatic but completely ambulatory  Filed Vitals:   01/12/16 1554  BP: 113/69  Pulse: 83  Temp: 97.9 F (36.6 C)  Resp: 18   Filed Weights   01/12/16 1554  Weight: 172 lb 8 oz (78.245 kg)    GENERAL:alert, no distress and comfortable SKIN: skin color, texture, turgor are normal, no rashes or significant lesions EYES: normal, conjunctiva are pink and non-injected, sclera clear OROPHARYNX:no exudate, no erythema  and lips, buccal mucosa, and tongue normal  NECK: supple, thyroid normal size, non-tender, without nodularity LYMPH:  no palpable lymphadenopathy in the cervical, axillary or inguinal LUNGS: clear to auscultation and percussion with normal breathing effort HEART: regular rate & rhythm and no murmurs and no lower extremity edema ABDOMEN:abdomen soft, non-tender and normal bowel sounds Musculoskeletal:no cyanosis of digits and no clubbing  PSYCH: alert & oriented x 3 with fluent speech NEURO: no focal motor/sensory deficits  LABORATORY DATA:  I have reviewed the data as listed Lab Results  Component Value Date   WBC 3.3* 01/12/2016   HGB 11.7 01/12/2016   HCT 35.1 01/12/2016   MCV 91.1 01/12/2016   PLT 204 01/12/2016    Recent Labs  12/18/15 1140  01/04/16 1527 01/12/16 1548  NA 141 139 143  K 4.0 4.3 4.1  CO2 '26 27 27  ' GLUCOSE 80 81 88  BUN 9.1 9.1 7.4  CREATININE 0.8 0.8 0.7  CALCIUM 9.4 8.7 9.0  PROT 7.4 6.4 6.8  ALBUMIN 4.2 3.5 3.6  AST '17 15 15  ' ALT <9 <9 <9  ALKPHOS 62 50 55  BILITOT 1.12 0.75 0.71    PATHOLOGY REPORT Diagnosis 12/03/2015 Colon, sigmoid, mass, biopsy -Invasive adenocarcinoma, moderately differentiated. See comment.  Comment: due to the presence of adenocarcinoma, IHC for DNA mismatch repair proteins will be performed and reported in an addendum.   RADIOGRAPHIC STUDIES: I have personally reviewed the radiological images as listed and agreed with the findings in the report. Ct Chest W Contrast  12/17/2015  CLINICAL DATA:  Newly diagnosed rectal carcinoma.  Staging. EXAM: CT CHEST WITH CONTRAST TECHNIQUE: Multidetector CT imaging of the chest was performed during intravenous contrast administration. CONTRAST:  75 mL Isovue-300 COMPARISON:  AP CT on 12/10/2015 FINDINGS: Mediastinum/Lymph Nodes: No masses, pathologically enlarged lymph nodes, or other significant abnormality. Lungs/Pleura: No pulmonary mass, infiltrate, or effusion. Upper abdomen: Several tiny sub-cm low-attenuation liver lesions noted, as seen on recent abdomen CT of 12/10/2015 ; see this prior separate report. Musculoskeletal: No chest wall mass or suspicious bone lesions identified. Mild thoracic spine degenerative changes noted. IMPRESSION: No evidence of metastatic disease or other acute findings within the thorax. Electronically Signed   By: Earle Gell M.D.   On: 12/17/2015 14:53   Mr Pelvis W Wo Contrast  12/17/2015  CLINICAL DATA:  Newly diagnosed rectal carcinoma. EXAM: MRI PELVIS WITHOUT AND WITH CONTRAST TECHNIQUE: Multiplanar multisequence MR imaging of the pelvis was performed both before and after administration of intravenous contrast. Small amount of [place] gel was administered per rectum to optimize tumor evaluation. CONTRAST:  23m  MULTIHANCE GADOBENATE DIMEGLUMINE 529 MG/ML IV SOLN COMPARISON:  CT on 12/10/2015 FINDINGS: TUMOR LOCATION Location from Anal Verge:  Mid to upper rectum Shortest Distance from Tumor to Anal Sphincter:  8 cm TUMOR DESCRIPTION Circumferential Extent: Completely circumferential, annular, and constricting Craniocaudal Extent:  Approximately 7 cm T - CATEGORY Extension through Muscularis Propria:  Yes = T3 Maximum extension beyond Muscularis Propria:  15 mm (early T3 = <548m advanced T3 = >5m21mExtramural vascular invasion/tumor thrombus:  None visualized Shortest distance of any tumor/node from Mesorectal Fascia: Several perirectal lymph nodes are seen, with posterior/presacral perirectal lymph node measuring approximately 3 mm from the mesorectal fascia on image 22 of series 4 Invasion of Anterior Peritoneal Reflection:  No Involvement of Adjacent Organs or Pelvic Sidewall Structures:  No N - CATEGORY Mesorectal Lymph Nodes >=5mm76mes, at least 2 left-sided nasal rectal lymph nodes are seen  measuring 6-7 mm. Other tiny less than 5 minute Mayer ease of rectal lymph nodes are also seen. Extra-mesorectal Lymphadenopathy:  No Other: Prior hysterectomy noted. Both ovaries are normal in appearance. IMPRESSION: Rectal adenocarcinoma T stage:  Advanced T3 Rectal adenocarcinoma N stage:  N1 Distance from tumor to the anal sphincter is 8 cm. Electronically Signed   By: Earle Gell M.D.   On: 12/17/2015 16:02   Mr Liver W Wo Contrast  12/18/2015  CLINICAL DATA:  Recently diagnosed rectal cancer. Multiple liver lesions. EXAM: MRI ABDOMEN WITHOUT AND WITH CONTRAST TECHNIQUE: Multiplanar multisequence MR imaging of the abdomen was performed both before and after the administration of intravenous contrast. CONTRAST:  106m MULTIHANCE GADOBENATE DIMEGLUMINE 529 MG/ML IV SOLN COMPARISON:  12/10/2015 FINDINGS: Lower chest:  Unremarkable Hepatobiliary: The several tiny hepatic hypodensities shown on CTA demonstrate no appreciable  enhancement on MRI. The most sharply defined of these measures 4 mm in the dome of segment 7 on image 23 series 1205. The other lesions are volume averaged because of the tiny 2-3 mm size, and accordingly somewhat nonspecific even on subtraction MRI, but generally reassuring given the very high T2 signal that these are tiny cysts. Contracted gallbladder. Pancreas: Unremarkable Spleen: Benign 4 mm peripheral cystic lesion in the spleen, nonenhancing, image 22 series 1204. Similar lesion along the inferior splenic margin measuring 7 mm on image 41 of series 1205. Both lesions have fluid signal intensity. Adrenals/Urinary Tract: Several tiny right mid kidney cysts. These appear benign. Adrenal glands normal. Stomach/Bowel: Unremarkable in the upper abdomen. Vascular/Lymphatic: Unremarkable Other: No supplemental non-categorized findings. Musculoskeletal: Schmorl's node along the superior endplate of L2. Mild thoracic spondylosis. IMPRESSION: 1. Multiple tiny cysts in the liver, spleen, and right mid kidney. In general these have benign imaging characteristics. Some are in the 2-3 mm range in the liver, causing volume averaging. As such, while I feel these should be treated as benign, periodic surveillance might be prudent. Electronically Signed   By: WVan ClinesM.D.   On: 12/18/2015 16:07   COLONOSCOPY DR. MEDOFF 12/03/2015 Impression Malignant partially obstructing tumor in the proximal rectum, its diameter measured 6 mm, biopsy. Distal margin at 15 cm.    ASSESSMENT & PLAN:  50year old Caucasian female, without significant past medical history, presented with abdominal pain and rectal bleeding. Colonoscopy showed a obstructing tumor in the proximal rectum.  1. Rectal adenocarcinoma, proximal rectum, cT3N1M0, stage IIIB, moderately differentiated --I have reviewed her colonoscopy, EUS, MRI of pelvis, CT of abdomen pelvis finding with pt and her family in details -The pelvic MRI showed a T3 lesion, 2  suspicious lymph nodes, likely state IIIB disease. She also was found to have multiple small indeterminate hypodense lesion in the liver, which was not found on her prior CT scan in 2007. We will order a abdominal MRI to further look into the liver lesions.  -CT chest was negative for distant metastasis. Abdominal MRI showed multiple cysts in the liver, spleen and right kidney. No concerning for metastasis. -We reviewed the natural history of rectal cancer, risk of recurrence after surgery, depending on the stage. We discussed the treatment option for stage IIIB rectal cancer. The standard of care is neoadjuvant chemotherapy and radiation, followed by surgery, then adjuvant chemotherapy. -she has started concurrent chemoradiation with  Xeloda, tolerating moderately well. - Lab results reviewed with her, we'll continue treatment and close monitoring.  2. Constipation - Possible related to her large rectal mass - I suggest her to increase her MiraLAX dose,  she did not tolerate Senna-S well  3. Nausea -she is afraid to take Zofran due to constipation. I called in Compazine for her today, she will use as needed  4. Urinary frequency -Possible related to radiation. -I encouraged her to drink more water, and discussed with Dr. Lisbeth Renshaw -If her symptoms gets worse, I'll check a UA to ruled out UTI.  Plan -continue chemotherapy and radiation -Continue lab weekly -She will see [place] next week  All questions were answered. The patient knows to call the clinic with any problems, questions or concerns. I spent 15 minutes counseling the patient face to face. The total time spent in the appointment was 20 minutes and more than 50% was on counseling.     Truitt Merle, MD 01/12/2016 5:06 PM

## 2016-01-12 NOTE — Telephone Encounter (Signed)
Called pt & informed to come in @ 4 pm to see Dr Burr Medico.  She reports feeling better after antihistamine but thinks she has a yeast infection.

## 2016-01-13 ENCOUNTER — Ambulatory Visit
Admission: RE | Admit: 2016-01-13 | Discharge: 2016-01-13 | Disposition: A | Discharge status: Home / Self Care | Payer: 59 | Source: Ambulatory Visit | Attending: Radiation Oncology | Admitting: Radiation Oncology

## 2016-01-13 DIAGNOSIS — Z510 Encounter for antineoplastic radiation therapy: Secondary | ICD-10-CM | POA: Diagnosis not present

## 2016-01-14 ENCOUNTER — Telehealth: Payer: Self-pay | Admitting: *Deleted

## 2016-01-14 ENCOUNTER — Other Ambulatory Visit: Payer: Self-pay | Admitting: *Deleted

## 2016-01-14 ENCOUNTER — Ambulatory Visit
Admission: RE | Admit: 2016-01-14 | Discharge: 2016-01-14 | Disposition: A | Discharge status: Home / Self Care | Payer: 59 | Source: Ambulatory Visit | Attending: Radiation Oncology | Admitting: Radiation Oncology

## 2016-01-14 DIAGNOSIS — C20 Malignant neoplasm of rectum: Secondary | ICD-10-CM

## 2016-01-14 DIAGNOSIS — Z510 Encounter for antineoplastic radiation therapy: Secondary | ICD-10-CM | POA: Diagnosis not present

## 2016-01-14 MED ORDER — CAPECITABINE 500 MG PO TABS: 825.0000 mg/m2 | ORAL_TABLET | Freq: Two times a day (BID) | ORAL | Status: DC

## 2016-01-14 NOTE — Telephone Encounter (Signed)
Pt called requesting a temporary parking placard due to pt is tired easily from radiation and taking oral chemo.  In addition, pt will be going to a concert in [place] this weekend.  Pt not sure if she is able to walk far to the event.  Pt would like to be able to park closer if possible.  Dr. Burr Medico notified.   Temporary placard form signed by md will be given to pt today when she comes in for radiation treatment. Pt's   Phone    [phone].

## 2016-01-15 ENCOUNTER — Encounter: Payer: Self-pay | Admitting: Student in an Organized Health Care Education/Training Program

## 2016-01-15 ENCOUNTER — Encounter: Payer: Self-pay | Admitting: Radiation Oncology

## 2016-01-15 ENCOUNTER — Ambulatory Visit
Admission: RE | Admit: 2016-01-15 | Discharge: 2016-01-15 | Disposition: A | Discharge status: Home / Self Care | Payer: 59 | Source: Ambulatory Visit | Attending: Radiation Oncology | Admitting: Radiation Oncology

## 2016-01-15 VITALS — BP 110/68 | HR 72 | Temp 97.8°F | Resp 18 | Ht 62.0 in | Wt 171.8 lb

## 2016-01-15 DIAGNOSIS — Z510 Encounter for antineoplastic radiation therapy: Secondary | ICD-10-CM | POA: Diagnosis not present

## 2016-01-15 DIAGNOSIS — C20 Malignant neoplasm of rectum: Secondary | ICD-10-CM

## 2016-01-15 NOTE — Progress Notes (Signed)
Mrs. Hok has received 12 fractions to her rectum.  No change in bowel and bladder patterns.  Having a bowel movement everyday urgency to constipation.  Taking Miralax  And Senokot to help keep her having bowel movements. Has some vaginal itching at times over the past week.  Appetite is altered at times with nausea.  Having fatigue in the evenings.  Denies pain. BP 110/68 mmHg  Pulse 72  Temp(Src) 97.8 F (36.6 C)  Resp 18  Ht 5\' 2"  (1.575 m)  Wt 171 lb 12.8 oz (77.928 kg)  BMI 31.41 kg/m2  SpO2 100%

## 2016-01-15 NOTE — Progress Notes (Signed)
parameds form left in box 01/14/16

## 2016-01-17 NOTE — Progress Notes (Signed)
   Department of Radiation Oncology  Phone:  [phone] Fax:        [phone]  Weekly Treatment Note    Name: Kaitlyn Morgan Date: 01/17/2016 MRN: [id number] DOB: 28-May-1966   Diagnosis:     ICD-9-CM ICD-10-CM   1. Rectal cancer (HCC) 154.1 C20      Current dose: 21.6 Gy  Current fraction: 12   MEDICATIONS: Current Outpatient Prescriptions  Medication Sig Dispense Refill  . capecitabine (XELODA) 500 MG tablet Take 3 tablets (1,500 mg total) by mouth 2 (two) times daily after a meal. 90 tablet 1  . fexofenadine (ALLEGRA) 180 MG tablet Take 180 mg by mouth daily.    . Polyethylene Glycol 3350 (MIRALAX PO) Take 17 g by mouth daily as needed.    . senna-docusate (SENOKOT S) 8.6-50 MG tablet Take 2 tablets by mouth at bedtime.    . fluticasone (FLONASE) 50 MCG/ACT nasal spray 1 spray each nostril twice a day (Patient not taking: Reported on 01/08/2016) 16 g 6  . Loratadine (CLARITIN PO) Take by mouth. Reported on 01/15/2016    . ondansetron (ZOFRAN) 8 MG tablet Take 1 tablet (8 mg total) by mouth every 8 (eight) hours as needed for nausea. (Patient not taking: Reported on 01/12/2016) 30 tablet 3  . prochlorperazine (COMPAZINE) 10 MG tablet Take 1 tablet (10 mg total) by mouth every 8 (eight) hours as needed for nausea. (Patient not taking: Reported on 01/15/2016) 20 tablet 2   No current facility-administered medications for this encounter.     ALLERGIES: Morphine and related and Zolpidem tartrate   LABORATORY DATA:  Lab Results  Component Value Date   WBC 3.3* 01/12/2016   HGB 11.7 01/12/2016   HCT 35.1 01/12/2016   MCV 91.1 01/12/2016   PLT 204 01/12/2016   Lab Results  Component Value Date   NA 143 01/12/2016   K 4.1 01/12/2016   CO2 27 01/12/2016   Lab Results  Component Value Date   ALT <9 01/12/2016   AST 15 01/12/2016   ALKPHOS 55 01/12/2016   BILITOT 0.71 01/12/2016     NARRATIVE: Kaitlyn Morgan was seen today for weekly treatment management. The  chart was checked and the patient's films were reviewed.  Mrs. Kaitlyn Morgan has received 12 fractions to her rectum.  No change in bowel and bladder patterns.  Having a bowel movement everyday urgency to constipation.  Taking Miralax  And Senokot to help keep her having bowel movements. Has some vaginal itching at times over the past week.  Appetite is altered at times with nausea.  Having fatigue in the evenings.  Denies pain. BP 110/68 mmHg  Pulse 72  Temp(Src) 97.8 F (36.6 C)  Resp 18  Ht 5\' 2"  (1.575 m)  Wt 171 lb 12.8 oz (77.928 kg)  BMI 31.41 kg/m2  SpO2 100%  PHYSICAL EXAMINATION: height is 5\' 2"  (1.575 m) and weight is 171 lb 12.8 oz (77.928 kg). Her temperature is 97.8 F (36.6 C). Her blood pressure is 110/68 and her pulse is 72. Her respiration is 18 and oxygen saturation is 100%.        ASSESSMENT: The patient is doing satisfactorily with treatment.  PLAN: We will continue with the patient's radiation treatment as planned.

## 2016-01-18 ENCOUNTER — Other Ambulatory Visit (HOSPITAL_BASED_OUTPATIENT_CLINIC_OR_DEPARTMENT_OTHER): Payer: 59

## 2016-01-18 ENCOUNTER — Ambulatory Visit
Admission: RE | Admit: 2016-01-18 | Discharge: 2016-01-18 | Disposition: A | Discharge status: Home / Self Care | Payer: 59 | Source: Ambulatory Visit | Attending: Radiation Oncology | Admitting: Radiation Oncology

## 2016-01-18 DIAGNOSIS — C20 Malignant neoplasm of rectum: Secondary | ICD-10-CM

## 2016-01-18 DIAGNOSIS — Z510 Encounter for antineoplastic radiation therapy: Secondary | ICD-10-CM | POA: Diagnosis not present

## 2016-01-18 LAB — COMPREHENSIVE METABOLIC PANEL
ALT: 10 U/L (ref 0–55)
AST: 18 U/L (ref 5–34)
Albumin: 3.8 g/dL (ref 3.5–5.0)
Alkaline Phosphatase: 61 U/L (ref 40–150)
Anion Gap: 6 mEq/L (ref 3–11)
BUN: 7.7 mg/dL (ref 7.0–26.0)
CO2: 29 mEq/L (ref 22–29)
Calcium: 9 mg/dL (ref 8.4–10.4)
Chloride: 107 mEq/L (ref 98–109)
Creatinine: 0.8 mg/dL (ref 0.6–1.1)
EGFR: >90 mL/min/{1.73_m2} (ref >90)
Glucose: 64 mg/dl — ABNORMAL LOW (ref 70–140)
Potassium: 4.2 mEq/L (ref 3.5–5.1)
Sodium: 142 mEq/L (ref 136–145)
Total Bilirubin: 0.91 mg/dL (ref 0.20–1.20)
Total Protein: 7.1 g/dL (ref 6.4–8.3)

## 2016-01-18 LAB — CBC WITH DIFFERENTIAL/PLATELET
BASO%: 1.2 % (ref 0.0–2.0)
Basophils Absolute: 0 10*3/uL (ref 0.0–0.1)
EOS%: 10.5 % — ABNORMAL HIGH (ref 0.0–7.0)
Eosinophils Absolute: 0.3 10*3/uL (ref 0.0–0.5)
HCT: 35.7 % (ref 34.8–46.6)
HGB: 11.8 g/dL (ref 11.6–15.9)
LYMPH%: 20 % (ref 14.0–49.7)
MCH: 30.1 pg (ref 25.1–34.0)
MCHC: 33.1 g/dL (ref 31.5–36.0)
MCV: 90.8 fL (ref 79.5–101.0)
MONO#: 0.3 10*3/uL (ref 0.1–0.9)
MONO%: 11.2 % (ref 0.0–14.0)
NEUT#: 1.7 10*3/uL (ref 1.5–6.5)
NEUT%: 57.1 % (ref 38.4–76.8)
Platelets: 201 10*3/uL (ref 145–400)
RBC: 3.93 10*6/uL (ref 3.70–5.45)
RDW: 15.7 % — ABNORMAL HIGH (ref 11.2–14.5)
WBC: 3 10*3/uL — ABNORMAL LOW (ref 3.9–10.3)
lymph#: 0.6 10*3/uL — ABNORMAL LOW (ref 0.9–3.3)

## 2016-01-19 ENCOUNTER — Ambulatory Visit
Admission: RE | Admit: 2016-01-19 | Discharge: 2016-01-19 | Disposition: A | Discharge status: Home / Self Care | Payer: 59 | Source: Ambulatory Visit | Attending: Radiation Oncology | Admitting: Radiation Oncology

## 2016-01-19 ENCOUNTER — Ambulatory Visit: Payer: 59 | Admitting: Hematology

## 2016-01-19 ENCOUNTER — Telehealth: Payer: Self-pay | Admitting: Hematology

## 2016-01-19 ENCOUNTER — Encounter: Payer: Self-pay | Admitting: Hematology

## 2016-01-19 ENCOUNTER — Encounter: Payer: Self-pay | Admitting: Student in an Organized Health Care Education/Training Program

## 2016-01-19 ENCOUNTER — Ambulatory Visit (HOSPITAL_BASED_OUTPATIENT_CLINIC_OR_DEPARTMENT_OTHER): Payer: 59 | Admitting: Hematology

## 2016-01-19 VITALS — BP 109/63 | HR 72 | Temp 98.2°F | Resp 18 | Ht 62.0 in | Wt 174.8 lb

## 2016-01-19 DIAGNOSIS — C20 Malignant neoplasm of rectum: Secondary | ICD-10-CM

## 2016-01-19 DIAGNOSIS — K5900 Constipation, unspecified: Secondary | ICD-10-CM | POA: Diagnosis not present

## 2016-01-19 DIAGNOSIS — R110 Nausea: Secondary | ICD-10-CM

## 2016-01-19 DIAGNOSIS — Z510 Encounter for antineoplastic radiation therapy: Secondary | ICD-10-CM | POA: Diagnosis not present

## 2016-01-19 MED ORDER — NYSTATIN 100000 UNIT/GM EX OINT: 1.0000 "application " | TOPICAL_OINTMENT | Freq: Two times a day (BID) | CUTANEOUS | Status: DC

## 2016-01-19 NOTE — Progress Notes (Signed)
[place]  [phone] [phone]  Clinic follow Up Note   Patient Care Team: Leandrew Koyanagi, MD as PCP - General (Internal Medicine) Truitt Merle, MD as Consulting Physician (Hematology) Kyung Rudd, MD as Consulting Physician (Radiation Oncology) Jackolyn Confer, MD as Consulting Physician (General Surgery) Richmond Campbell, MD as Consulting Physician (Gastroenterology) Milus Banister, MD as Attending Physician (Gastroenterology) 01/19/2016   CHIEF COMPLAINTS:  Follow up rectal cancer  Oncology History   Rectal cancer [facility])   Staging form: Colon and Rectum, AJCC 7th Edition     Clinical stage from 12/03/2015: Stage IIIB (T3, N1, M0) - Signed by Truitt Merle, MD on 12/18/2015       Rectal cancer ([place])   12/03/2015 Initial Diagnosis Rectal cancer ([place])   12/03/2015 Procedure Colonoscopy showed a partially obstructing tumor in the proximal rectum, its diameter measured 6 mm, biopsed   12/03/2015 Initial Biopsy Rectum mass biopsy showed adenocarcinoma, moderately differentiated   12/10/2015 Imaging CT abdomen and pelvis with contrast showed luminal narrowing and mural thickening in the proximal to mid rectum. There is a 6 mm short axis mesorectal lymph node suspicious for metastasis. There are several tiny indeterminate liver lesions    12/17/2015 Imaging MRI pelvis with and without contrast showed a advanced T3 rectal adenocarcinoma (7cm), N1 disease (at least 2 left-sided nasal rectal lymph nodes are seen measuring 6-7 mm), distance from tumor to the sphincter is 8 cm   12/17/2015 Imaging CT chest with contrast showed no evidence of metastasis or other changes    HISTORY OF PRESENTING ILLNESS:  Kaitlyn Morgan 50 y.o. female is here because of Her newly diagnosed rectal cancer. She is accompanied by her daughter and her mother to our multidisciplinary GI clinic today.  She has had chronic abdominal pain for 2-3 years, in the epigastric area and low abdomen,  which has been worse lately, and she noticed constipation, abdominal bloating and mild rectal bleeding for 2 months. She was referred to Dr. Earlean Shawl and underwent colonoscopy on 12/03/2015. A partial obstructive rectal mass was found in the proximal rectum, biopsy showed adenocarcinoma. She was referred to surgeon Dr. Zella Richer who referred her to [place] to consider new adjuvant chemoradiation.  She feels well overall, has mild fatigue, but able to tolerate her routine full-time job in daily activities without difficulties. She has good appetite, but eats less due to the epigastric pain after meal. She denies any other significant pain, cough, or other symptoms. No recent weight loss. She has been on phentermine for weight loss recently, and stopped a few days ago.  CURRENT THERAPY: concurrent chemotherapy and irradiation with Xeloda 1500 mg twice daily Monday through Friday, started on 12/30/2015  INTERIM HISTORY:  [name] returns for follow up. She is on third week of radiation, tolerating well overall. Her main complaint is constipation, she has 1 small bowel movement every day, but still feels she is packed. She had a similar sensation before her colonoscopy, which dramatically improved after the bowel prep for colonoscopy. No significant nausea, mild fatigue is stable, she is able tolerate routine activities. She also reports vaginal itchiness lately, no significant discharge. She used mass stenting lotion which was ordered by her gynecologist a while ago, had some improvement. No fever or chills, no other complaints.  MEDICAL HISTORY:  History reviewed. No pertinent past medical history.  SURGICAL HISTORY: Past Surgical History  Procedure Laterality Date  . Cesarean section    . Abdominal hysterectomy    .  Tubal ligation      SOCIAL HISTORY: Social History   Social History  . Marital Status: Legally Separated    Spouse Name: N/A  . Number of Children: N/A  . Years of Education: N/A     Occupational History  . Not on file.   Social History Main Topics  . Smoking status: Never Smoker   . Smokeless tobacco: Never Used  . Alcohol Use: No  . Drug Use: No  . Sexual Activity: Not on file   Other Topics Concern  . Not on file   Social History Narrative   She has 3 children, she lives with her twin sons who are 35. She works for [employer]   FAMILY HISTORY: Family History  Problem Relation Age of Onset  . Cancer Maternal Grandmother 30    breast cancer   . Cancer Paternal Grandmother 74    uterine cancer   . Hypertension Father   . Hypertension Sister   . Hypertension Paternal Grandfather   . Heart attack Paternal Grandfather   . Hypertension Sister     ALLERGIES:  is allergic to morphine and related and zolpidem tartrate.  MEDICATIONS:  Current Outpatient Prescriptions  Medication Sig Dispense Refill  . capecitabine (XELODA) 500 MG tablet Take 3 tablets (1,500 mg total) by mouth 2 (two) times daily after a meal. 90 tablet 1  . fexofenadine (ALLEGRA) 180 MG tablet Take 180 mg by mouth daily.    . fluticasone (FLONASE) 50 MCG/ACT nasal spray 1 spray each nostril twice a day 16 g 6  . Loratadine (CLARITIN PO) Take by mouth. Reported on 01/15/2016    . ondansetron (ZOFRAN) 8 MG tablet Take 1 tablet (8 mg total) by mouth every 8 (eight) hours as needed for nausea. 30 tablet 3  . Polyethylene Glycol 3350 (MIRALAX PO) Take 17 g by mouth daily as needed.    . prochlorperazine (COMPAZINE) 10 MG tablet Take 1 tablet (10 mg total) by mouth every 8 (eight) hours as needed for nausea. 20 tablet 2  . senna-docusate (SENOKOT S) 8.6-50 MG tablet Take 2 tablets by mouth at bedtime.    Marland Kitchen nystatin ointment (MYCOSTATIN) Apply 1 application topically 2 (two) times daily. 30 g 0   No current facility-administered medications for this visit.    REVIEW OF SYSTEMS:   Constitutional: Denies fevers, chills or abnormal night sweats Eyes: Denies blurriness of vision, double  vision or watery eyes Ears, nose, mouth, throat, and face: Denies mucositis or sore throat Respiratory: Denies cough, dyspnea or wheezes Cardiovascular: Denies palpitation, chest discomfort or lower extremity swelling Gastrointestinal:  Denies nausea, heartburn or change in bowel habits Skin: Denies abnormal skin rashes Lymphatics: Denies new lymphadenopathy or easy bruising Neurological:Denies numbness, tingling or new weaknesses Behavioral/Psych: Mood is stable, no new changes  All other systems were reviewed with the patient and are negative.  PHYSICAL EXAMINATION: ECOG PERFORMANCE STATUS: 1 - Symptomatic but completely ambulatory  Filed Vitals:   01/19/16 1437  BP: 109/63  Pulse: 72  Temp: 98.2 F (36.8 C)  Resp: 18   Filed Weights   01/19/16 1437  Weight: 174 lb 12.8 oz (79.289 kg)    GENERAL:alert, no distress and comfortable SKIN: skin color, texture, turgor are normal, no rashes or significant lesions EYES: normal, conjunctiva are pink and non-injected, sclera clear OROPHARYNX:no exudate, no erythema and lips, buccal mucosa, and tongue normal  NECK: supple, thyroid normal size, non-tender, without nodularity LYMPH:  no palpable lymphadenopathy in the cervical,  axillary or inguinal LUNGS: clear to auscultation and percussion with normal breathing effort HEART: regular rate & rhythm and no murmurs and no lower extremity edema ABDOMEN:abdomen soft, non-tender and normal bowel sounds. The perianal exam showed intact skin, no significant skin erythema.  Musculoskeletal:no cyanosis of digits and no clubbing  PSYCH: alert & oriented x 3 with fluent speech NEURO: no focal motor/sensory deficits  LABORATORY DATA:  I have reviewed the data as listed CBC Latest Ref Rng 01/18/2016 01/12/2016 01/04/2016  WBC 3.9 - 10.3 10e3/uL 3.0(L) 3.3(L) 3.5(L)  Hemoglobin 11.6 - 15.9 g/dL 11.8 11.7 11.3(L)  Hematocrit 34.8 - 46.6 % 35.7 35.1 32.8(L)  Platelets 145 - 400 10e3/uL 201 204 242     CMP Latest Ref Rng 01/18/2016 01/12/2016 01/04/2016  Glucose 70 - 140 mg/dl 64(L) 88 81  BUN 7.0 - 26.0 mg/dL 7.7 7.4 9.1  Creatinine 0.6 - 1.1 mg/dL 0.8 0.7 0.8  Sodium 136 - 145 mEq/L 142 143 139  Potassium 3.5 - 5.1 mEq/L 4.2 4.1 4.3  CO2 22 - 29 mEq/L '29 27 27  ' Calcium 8.4 - 10.4 mg/dL 9.0 9.0 8.7  Total Protein 6.4 - 8.3 g/dL 7.1 6.8 6.4  Total Bilirubin 0.20 - 1.20 mg/dL 0.91 0.71 0.75  Alkaline Phos 40 - 150 U/L 61 55 50  AST 5 - 34 U/L '18 15 15  ' ALT 0 - 55 U/L 10 <9 <9     PATHOLOGY REPORT Diagnosis 12/03/2015 Colon, sigmoid, mass, biopsy -Invasive adenocarcinoma, moderately differentiated. See comment.  Comment: due to the presence of adenocarcinoma, IHC for DNA mismatch repair proteins will be performed and reported in an addendum.   RADIOGRAPHIC STUDIES: I have personally reviewed the radiological images as listed and agreed with the findings in the report. No results found. COLONOSCOPY DR. MEDOFF 12/03/2015  Impression Malignant partially obstructing tumor in the proximal rectum, its diameter measured 6 mm, biopsy. Distal margin at 15 cm.    ASSESSMENT & PLAN:  50 year old Caucasian female, without significant past medical history, presented with abdominal pain and rectal bleeding. Colonoscopy showed a obstructing tumor in the proximal rectum.  1. Rectal adenocarcinoma, proximal rectum, cT3N1M0, stage IIIB, moderately differentiated --I have reviewed her colonoscopy, EUS, MRI of pelvis, CT of abdomen pelvis finding with pt and her family in details -The pelvic MRI showed a T3 lesion, 2 suspicious lymph nodes, likely state IIIB disease. She also was found to have multiple small indeterminate hypodense lesion in the liver, which was not found on her prior CT scan in 2007. We will order a abdominal MRI to further look into the liver lesions.  -CT chest was negative for distant metastasis. Abdominal MRI showed multiple cysts in the liver, spleen and right kidney. No  concerning for metastasis. -We reviewed the natural history of rectal cancer, risk of recurrence after surgery, depending on the stage. We discussed the treatment option for stage IIIB rectal cancer. The standard of care is neoadjuvant chemotherapy and radiation, followed by surgery, then adjuvant chemotherapy. -she has started concurrent chemoradiation with  Xeloda, tolerating moderately well. -We reviewed the entire treatment plan again, she will likely have surgery in 6-8 weeks after she completes chemotherapy and radiation, followed by adjuvant chemotherapy after she recovers from surgery. - Lab results reviewed with her, we'll continue treatment. -Continue labs every week   2. Constipation - Possible related to her large rectal mass - I suggest her to increase her MiraLAX dose, she did not tolerate Senna-S well. I suggest her to take  MiraLAX every hour on weekend, similar to her bowel preparation for colonoscopy, to empty her bowel. She agrees to try on weekend.  3. Nausea -she is afraid to take Zofran due to constipation. She will use Compazine as needed. Her nausea has been on mild lately.  4. Vaginal itchiness -Questionable yeast infection. Improved with nystatin cream. I refilled her nystatin lotion today   Plan -continue chemotherapy and radiation -Continue lab weekly -She will seepractitioner Lattie Haw in 2 weeks, and me in 3 weeks with the last dose of chemo and radiation.  All questions were answered. The patient knows to call the clinic with any problems, questions or concerns. I spent 15 minutes counseling the patient face to face. The total time spent in the appointment was 20 minutes and more than 50% was on counseling.     Truitt Merle, MD 01/19/2016 5:14 PM

## 2016-01-19 NOTE — Telephone Encounter (Signed)
per pof to sch pt appt-gave pt copy of avs °

## 2016-01-19 NOTE — Progress Notes (Signed)
°  parameds form left in box 01/14/16- left forms for dr. Burr Medico to sign

## 2016-01-20 ENCOUNTER — Ambulatory Visit
Admission: RE | Admit: 2016-01-20 | Discharge: 2016-01-20 | Disposition: A | Discharge status: Home / Self Care | Payer: 59 | Source: Ambulatory Visit | Attending: Radiation Oncology | Admitting: Radiation Oncology

## 2016-01-20 ENCOUNTER — Telehealth: Payer: Self-pay | Admitting: Pharmacist

## 2016-01-20 DIAGNOSIS — Z510 Encounter for antineoplastic radiation therapy: Secondary | ICD-10-CM | POA: Diagnosis not present

## 2016-01-20 NOTE — Telephone Encounter (Signed)
Oral Chemotherapy Follow-Up Form  Original Start date of oral chemotherapy: 12/30/15  Called patient today to follow up regarding patient's oral chemotherapy medication: Xeloda Current dose: 1500 mg BID  Pt saw Dr. Burr Medico yesterday.  CBC & CMET stable.  Pt reports 0 tablets/doses missed in the last 3 weeks.   Pt reports the following side effects:  Nausea - "comes & goes."  She has Compazine & Zofran at home PRN. Edema - hands & ankles (R > L).  Pt states Dr. Burr Medico aware.  She noticed swelling after she started Xeloda.  It is worse when she has constipation. Constipation - Feels "full".  MD & pt have a plan for increased Miralax dosing over weekend.  Pt denies swallowing difficulties. She has a yeast infection and is going to get the Nystatin lotion today.  Will follow up and call patient again on 02/05/16.  She will have surgery after XRT then adjuvant chemotherapy.  Thank you, Kennith Center, Pharm.D., CPP 01/20/2016@2 :09 PM Oral Chemotherapy Clinic

## 2016-01-21 ENCOUNTER — Encounter: Payer: Self-pay | Admitting: Radiation Oncology

## 2016-01-21 ENCOUNTER — Ambulatory Visit
Admission: RE | Admit: 2016-01-21 | Discharge: 2016-01-21 | Disposition: A | Discharge status: Home / Self Care | Payer: 59 | Source: Ambulatory Visit | Attending: Radiation Oncology | Admitting: Radiation Oncology

## 2016-01-21 DIAGNOSIS — Z510 Encounter for antineoplastic radiation therapy: Secondary | ICD-10-CM | POA: Diagnosis not present

## 2016-01-22 ENCOUNTER — Encounter: Payer: Self-pay | Admitting: Radiation Oncology

## 2016-01-22 ENCOUNTER — Ambulatory Visit
Admission: RE | Admit: 2016-01-22 | Discharge: 2016-01-22 | Disposition: A | Discharge status: Home / Self Care | Payer: 59 | Source: Ambulatory Visit | Attending: Radiation Oncology | Admitting: Radiation Oncology

## 2016-01-22 ENCOUNTER — Encounter: Payer: Self-pay | Admitting: Hematology

## 2016-01-22 VITALS — BP 101/64 | HR 77 | Temp 98.0°F | Resp 18

## 2016-01-22 DIAGNOSIS — C20 Malignant neoplasm of rectum: Secondary | ICD-10-CM

## 2016-01-22 DIAGNOSIS — Z510 Encounter for antineoplastic radiation therapy: Secondary | ICD-10-CM | POA: Diagnosis not present

## 2016-01-22 NOTE — Progress Notes (Signed)
Mrs. Vilardi has received 17 fractions to her rectum.  Denies irritation to rectum.  No change in bowel and bladder pattern taking Senokot S and Miralax.  Appetite is okay.  Trying to get her protein food into her diet and stay hydrated. Having some nausea has not taken any Compazine.  Denies fatigue and [pain. BP 101/64 mmHg  Pulse 77  Temp(Src) 98 F (36.7 C) (Oral)  Resp 18

## 2016-01-24 NOTE — Progress Notes (Signed)
   Department of Radiation Oncology  Phone:  [phone] Fax:        [phone]  Weekly Treatment Note    Name: Kaitlyn Morgan Date: 01/24/2016 MRN: [id number] DOB: 1966/05/02   Diagnosis:     ICD-9-CM ICD-10-CM   1. Rectal cancer ([facility]) 154.1 C20      Current dose: 30.6 Gy  Current fraction: 17   MEDICATIONS: Current Outpatient Prescriptions  Medication Sig Dispense Refill  . capecitabine (XELODA) 500 MG tablet Take 3 tablets (1,500 mg total) by mouth 2 (two) times daily after a meal. 90 tablet 1  . fexofenadine (ALLEGRA) 180 MG tablet Take 180 mg by mouth daily.    Marland Kitchen nystatin ointment (MYCOSTATIN) Apply 1 application topically 2 (two) times daily. 30 g 0  . Polyethylene Glycol 3350 (MIRALAX PO) Take 17 g by mouth daily as needed.    . senna-docusate (SENOKOT S) 8.6-50 MG tablet Take 2 tablets by mouth at bedtime.    . fluticasone (FLONASE) 50 MCG/ACT nasal spray 1 spray each nostril twice a day (Patient not taking: Reported on 01/22/2016) 16 g 6  . Loratadine (CLARITIN PO) Take by mouth. Reported on 01/22/2016    . ondansetron (ZOFRAN) 8 MG tablet Take 1 tablet (8 mg total) by mouth every 8 (eight) hours as needed for nausea. (Patient not taking: Reported on 01/22/2016) 30 tablet 3  . prochlorperazine (COMPAZINE) 10 MG tablet Take 1 tablet (10 mg total) by mouth every 8 (eight) hours as needed for nausea. (Patient not taking: Reported on 01/22/2016) 20 tablet 2   No current facility-administered medications for this encounter.     ALLERGIES: Morphine and related and Zolpidem tartrate   LABORATORY DATA:  Lab Results  Component Value Date   WBC 3.0* 01/18/2016   HGB 11.8 01/18/2016   HCT 35.7 01/18/2016   MCV 90.8 01/18/2016   PLT 201 01/18/2016   Lab Results  Component Value Date   NA 142 01/18/2016   K 4.2 01/18/2016   CO2 29 01/18/2016   Lab Results  Component Value Date   ALT 10 01/18/2016   AST 18 01/18/2016   ALKPHOS 61 01/18/2016   BILITOT 0.91  01/18/2016     NARRATIVE: Kaitlyn Morgan was seen today for weekly treatment management. The chart was checked and the patient's films were reviewed.  Kaitlyn Morgan has received 17 fractions to her rectum.  Denies irritation to rectum.  No change in bowel and bladder pattern taking Senokot S and Miralax.  Appetite is okay.  Trying to get her protein food into her diet and stay hydrated. Having some nausea has not taken any Compazine.  Denies fatigue and [pain. BP 101/64 mmHg  Pulse 77  Temp(Src) 98 F (36.7 C) (Oral)  Resp 18  PHYSICAL EXAMINATION: oral temperature is 98 F (36.7 C). Her blood pressure is 101/64 and her pulse is 77. Her respiration is 18.        ASSESSMENT: The patient is doing satisfactorily with treatment.  PLAN: We will continue with the patient's radiation treatment as planned.

## 2016-01-25 ENCOUNTER — Other Ambulatory Visit (HOSPITAL_BASED_OUTPATIENT_CLINIC_OR_DEPARTMENT_OTHER): Payer: 59

## 2016-01-25 ENCOUNTER — Ambulatory Visit
Admission: RE | Admit: 2016-01-25 | Discharge: 2016-01-25 | Disposition: A | Discharge status: Home / Self Care | Payer: 59 | Source: Ambulatory Visit | Attending: Radiation Oncology | Admitting: Radiation Oncology

## 2016-01-25 DIAGNOSIS — C20 Malignant neoplasm of rectum: Secondary | ICD-10-CM | POA: Diagnosis not present

## 2016-01-25 DIAGNOSIS — Z510 Encounter for antineoplastic radiation therapy: Secondary | ICD-10-CM | POA: Diagnosis not present

## 2016-01-25 LAB — COMPREHENSIVE METABOLIC PANEL
ALT: 20 U/L (ref 0–55)
AST: 24 U/L (ref 5–34)
Albumin: 3.7 g/dL (ref 3.5–5.0)
Alkaline Phosphatase: 61 U/L (ref 40–150)
Anion Gap: 7 mEq/L (ref 3–11)
BUN: 9.9 mg/dL (ref 7.0–26.0)
CO2: 26 mEq/L (ref 22–29)
Calcium: 8.7 mg/dL (ref 8.4–10.4)
Chloride: 107 mEq/L (ref 98–109)
Creatinine: 0.7 mg/dL (ref 0.6–1.1)
EGFR: >90 mL/min/{1.73_m2} (ref >90)
Glucose: 86 mg/dl (ref 70–140)
Potassium: 4.1 mEq/L (ref 3.5–5.1)
Sodium: 141 mEq/L (ref 136–145)
Total Bilirubin: 0.97 mg/dL (ref 0.20–1.20)
Total Protein: 6.8 g/dL (ref 6.4–8.3)

## 2016-01-25 LAB — CBC WITH DIFFERENTIAL/PLATELET
BASO%: 1 % (ref 0.0–2.0)
Basophils Absolute: 0 10*3/uL (ref 0.0–0.1)
EOS%: 13.6 % — ABNORMAL HIGH (ref 0.0–7.0)
Eosinophils Absolute: 0.4 10*3/uL (ref 0.0–0.5)
HCT: 35.1 % (ref 34.8–46.6)
HGB: 11.6 g/dL (ref 11.6–15.9)
LYMPH%: 16.8 % (ref 14.0–49.7)
MCH: 30.7 pg (ref 25.1–34.0)
MCHC: 33.1 g/dL (ref 31.5–36.0)
MCV: 92.6 fL (ref 79.5–101.0)
MONO#: 0.4 10*3/uL (ref 0.1–0.9)
MONO%: 12.5 % (ref 0.0–14.0)
NEUT#: 1.6 10*3/uL (ref 1.5–6.5)
NEUT%: 56.1 % (ref 38.4–76.8)
Platelets: 181 10*3/uL (ref 145–400)
RBC: 3.79 10*6/uL (ref 3.70–5.45)
RDW: 17 % — ABNORMAL HIGH (ref 11.2–14.5)
WBC: 2.8 10*3/uL — ABNORMAL LOW (ref 3.9–10.3)
lymph#: 0.5 10*3/uL — ABNORMAL LOW (ref 0.9–3.3)

## 2016-01-26 ENCOUNTER — Ambulatory Visit
Admission: RE | Admit: 2016-01-26 | Discharge: 2016-01-26 | Disposition: A | Discharge status: Home / Self Care | Payer: 59 | Source: Ambulatory Visit | Attending: Radiation Oncology | Admitting: Radiation Oncology

## 2016-01-26 DIAGNOSIS — Z510 Encounter for antineoplastic radiation therapy: Secondary | ICD-10-CM | POA: Diagnosis not present

## 2016-01-27 ENCOUNTER — Ambulatory Visit
Admission: RE | Admit: 2016-01-27 | Discharge: 2016-01-27 | Disposition: A | Discharge status: Home / Self Care | Payer: 59 | Source: Ambulatory Visit | Attending: Radiation Oncology | Admitting: Radiation Oncology

## 2016-01-27 DIAGNOSIS — Z510 Encounter for antineoplastic radiation therapy: Secondary | ICD-10-CM | POA: Diagnosis not present

## 2016-01-28 ENCOUNTER — Ambulatory Visit
Admission: RE | Admit: 2016-01-28 | Discharge: 2016-01-28 | Disposition: A | Discharge status: Home / Self Care | Payer: 59 | Source: Ambulatory Visit | Attending: Radiation Oncology | Admitting: Radiation Oncology

## 2016-01-28 DIAGNOSIS — Z510 Encounter for antineoplastic radiation therapy: Secondary | ICD-10-CM | POA: Diagnosis not present

## 2016-01-29 ENCOUNTER — Ambulatory Visit
Admission: RE | Admit: 2016-01-29 | Discharge: 2016-01-29 | Disposition: A | Discharge status: Home / Self Care | Payer: 59 | Source: Ambulatory Visit | Attending: Radiation Oncology | Admitting: Radiation Oncology

## 2016-01-29 ENCOUNTER — Encounter: Payer: Self-pay | Admitting: Radiation Oncology

## 2016-01-29 VITALS — BP 105/74 | HR 76 | Temp 98.2°F | Resp 16 | Ht 62.0 in | Wt 172.7 lb

## 2016-01-29 DIAGNOSIS — C20 Malignant neoplasm of rectum: Secondary | ICD-10-CM

## 2016-01-29 DIAGNOSIS — Z510 Encounter for antineoplastic radiation therapy: Secondary | ICD-10-CM | POA: Diagnosis not present

## 2016-01-29 NOTE — Progress Notes (Addendum)
Kaitlyn Morgan has received 22 fractions to her rectum.  Has irritation to her rectum and vaginal area today.  Has Nystatin ointment to use to her vagina and using Desitin to her rectum with some relief.  Started diarrhea stools on Monday night.  Stopped taking Senokot S and Miralax.  Appetite is good.  Having some nausea has not needed the Zofran or Compazine.  Having fatigue during the day.  Having frequent urination this week.  Feel she is emptying her bladder.  Has a medication ordered, does not know the name of the medication will call us back with the name.  Pain 7/10 rectal and vaginal pain.  Taking sitz baths. BP 105/74 mmHg  Pulse 76  Temp(Src) 98.2 F (36.8 C)  Resp 16  Ht 5\' 2"  (1.575 m)  Wt 172 lb 11.2 oz (78.336 kg)  BMI 31.58 kg/m2  SpO2 100%

## 2016-01-29 NOTE — Progress Notes (Signed)
Department of Radiation Oncology  Phone:  [phone] Fax:        [phone]  Weekly Treatment Note    Name: Kaitlyn Morgan Date: 01/30/2016 MRN: [id number] DOB: July 31, 1966   Diagnosis:     ICD-9-CM ICD-10-CM   1. Rectal cancer ([facility]) 154.1 C20      Current dose: 39.6 Gy  Current fraction: 22   MEDICATIONS: Current Outpatient Prescriptions  Medication Sig Dispense Refill  . capecitabine (XELODA) 500 MG tablet Take 3 tablets (1,500 mg total) by mouth 2 (two) times daily after a meal. 90 tablet 1  . nystatin ointment (MYCOSTATIN) Apply 1 application topically 2 (two) times daily. 30 g 0  . fexofenadine (ALLEGRA) 180 MG tablet Take 180 mg by mouth daily. Reported on 01/29/2016    . fluticasone (FLONASE) 50 MCG/ACT nasal spray 1 spray each nostril twice a day (Patient not taking: Reported on 01/22/2016) 16 g 6  . Loratadine (CLARITIN PO) Take by mouth. Reported on 01/29/2016    . ondansetron (ZOFRAN) 8 MG tablet Take 1 tablet (8 mg total) by mouth every 8 (eight) hours as needed for nausea. (Patient not taking: Reported on 01/22/2016) 30 tablet 3  . Polyethylene Glycol 3350 (MIRALAX PO) Take 17 g by mouth daily as needed. Reported on 01/29/2016    . prochlorperazine (COMPAZINE) 10 MG tablet Take 1 tablet (10 mg total) by mouth every 8 (eight) hours as needed for nausea. (Patient not taking: Reported on 01/22/2016) 20 tablet 2  . senna-docusate (SENOKOT S) 8.6-50 MG tablet Take 2 tablets by mouth at bedtime. Reported on 01/29/2016     No current facility-administered medications for this encounter.     ALLERGIES: Morphine and related and Zolpidem tartrate   LABORATORY DATA:  Lab Results  Component Value Date   WBC 2.8* 01/25/2016   HGB 11.6 01/25/2016   HCT 35.1 01/25/2016   MCV 92.6 01/25/2016   PLT 181 01/25/2016   Lab Results  Component Value Date   NA 141 01/25/2016   K 4.1 01/25/2016   CO2 26 01/25/2016   Lab Results  Component Value Date   ALT 20 01/25/2016     AST 24 01/25/2016   ALKPHOS 61 01/25/2016   BILITOT 0.97 01/25/2016     NARRATIVE: Kaitlyn Morgan was seen today for weekly treatment management. The chart was checked and the patient's films were reviewed.  Kaitlyn Morgan has received 22 fractions to her rectum. She has irritation to her rectum and vaginal area today. Has Nystatin ointment to use to her vagina and using Desitin to her rectum with some relief. The patient started to have diarrhea Monday night. Stopped taking Senokot and Miralax. Appetite is good. Having some nausea and has not needed the Zofran or Compazine. Having fatigue during the day. Has frequent urination this week. Feels she is emptying her bladder. Has a medication ordered, does not know the name of the medication will call us back with the name. Pain 7/10 rectal and vaginal pain.  PHYSICAL EXAMINATION: height is 5\' 2"  (1.575 m) and weight is 172 lb 11.2 oz (78.336 kg). Her temperature is 98.2 F (36.8 C). Her blood pressure is 105/74 and her pulse is 76. Her respiration is 16 and oxygen saturation is 100%.        ASSESSMENT: The patient is doing satisfactorily with treatment.  PLAN: We will continue with the patient's radiation treatment as planned. I advised the patient to use metamucil to help make her regular and use Imodium up  to 8 times as needed.  ------------------------------------------------  Jodelle Gross, MD, PhD  This document serves as a record of services personally performed by Kyung Rudd, MD. It was created on his behalf by Darcus Austin, a trained medical scribe. The creation of this record is based on the scribe's personal observations and the provider's statements to them. This document has been checked and approved by the attending provider.

## 2016-02-01 ENCOUNTER — Ambulatory Visit
Admission: RE | Admit: 2016-02-01 | Discharge: 2016-02-01 | Disposition: A | Discharge status: Home / Self Care | Payer: 59 | Source: Ambulatory Visit | Attending: Radiation Oncology | Admitting: Radiation Oncology

## 2016-02-01 ENCOUNTER — Other Ambulatory Visit (HOSPITAL_BASED_OUTPATIENT_CLINIC_OR_DEPARTMENT_OTHER): Payer: 59

## 2016-02-01 ENCOUNTER — Encounter: Payer: Self-pay | Admitting: Genetic Counselor

## 2016-02-01 ENCOUNTER — Ambulatory Visit (HOSPITAL_BASED_OUTPATIENT_CLINIC_OR_DEPARTMENT_OTHER): Payer: 59 | Admitting: Genetic Counselor

## 2016-02-01 DIAGNOSIS — Z8049 Family history of malignant neoplasm of other genital organs: Secondary | ICD-10-CM | POA: Diagnosis not present

## 2016-02-01 DIAGNOSIS — C20 Malignant neoplasm of rectum: Secondary | ICD-10-CM | POA: Diagnosis not present

## 2016-02-01 DIAGNOSIS — Z315 Encounter for genetic counseling: Secondary | ICD-10-CM | POA: Diagnosis not present

## 2016-02-01 DIAGNOSIS — Z803 Family history of malignant neoplasm of breast: Secondary | ICD-10-CM | POA: Diagnosis not present

## 2016-02-01 DIAGNOSIS — Z510 Encounter for antineoplastic radiation therapy: Secondary | ICD-10-CM | POA: Diagnosis not present

## 2016-02-01 LAB — COMPREHENSIVE METABOLIC PANEL
ALT: 20 U/L (ref 0–55)
AST: 24 U/L (ref 5–34)
Albumin: 3.9 g/dL (ref 3.5–5.0)
Alkaline Phosphatase: 64 U/L (ref 40–150)
Anion Gap: 9 mEq/L (ref 3–11)
BUN: 7.5 mg/dL (ref 7.0–26.0)
CO2: 27 mEq/L (ref 22–29)
Calcium: 9 mg/dL (ref 8.4–10.4)
Chloride: 105 mEq/L (ref 98–109)
Creatinine: 0.8 mg/dL (ref 0.6–1.1)
EGFR: >90 mL/min/{1.73_m2} (ref >90)
Glucose: 86 mg/dl (ref 70–140)
Potassium: 3.7 mEq/L (ref 3.5–5.1)
Sodium: 142 mEq/L (ref 136–145)
Total Bilirubin: 2.07 mg/dL — ABNORMAL HIGH (ref 0.20–1.20)
Total Protein: 7.1 g/dL (ref 6.4–8.3)

## 2016-02-01 LAB — CBC WITH DIFFERENTIAL/PLATELET
BASO%: 0.4 % (ref 0.0–2.0)
Basophils Absolute: 0 10*3/uL (ref 0.0–0.1)
EOS%: 12.7 % — ABNORMAL HIGH (ref 0.0–7.0)
Eosinophils Absolute: 0.3 10*3/uL (ref 0.0–0.5)
HCT: 34.4 % — ABNORMAL LOW (ref 34.8–46.6)
HGB: 11.8 g/dL (ref 11.6–15.9)
LYMPH%: 14.6 % (ref 14.0–49.7)
MCH: 31.6 pg (ref 25.1–34.0)
MCHC: 34.3 g/dL (ref 31.5–36.0)
MCV: 92 fL (ref 79.5–101.0)
MONO#: 0.4 10*3/uL (ref 0.1–0.9)
MONO%: 13.5 % (ref 0.0–14.0)
NEUT#: 1.6 10*3/uL (ref 1.5–6.5)
NEUT%: 58.8 % (ref 38.4–76.8)
Platelets: 150 10*3/uL (ref 145–400)
RBC: 3.74 10*6/uL (ref 3.70–5.45)
RDW: 17.5 % — ABNORMAL HIGH (ref 11.2–14.5)
WBC: 2.7 10*3/uL — ABNORMAL LOW (ref 3.9–10.3)
lymph#: 0.4 10*3/uL — ABNORMAL LOW (ref 0.9–3.3)

## 2016-02-01 NOTE — Progress Notes (Signed)
REFERRING PROVIDER: Leandrew Koyanagi, MD [place], [place] [address]   Truitt Merle, MD  PRIMARY PROVIDER:  Leandrew Koyanagi, MD  PRIMARY REASON FOR VISIT:  1. Rectal cancer ([place])   2. Family history of breast cancer   3. Family history of uterine cancer      HISTORY OF PRESENT ILLNESS:   Kaitlyn Morgan, a 50 y.o. female, was seen for a [place] cancer genetics consultation at the request of Dr. Burr Medico due to a personal and family history of cancer.  Kaitlyn Morgan presents to clinic today to discuss the possibility of a hereditary predisposition to cancer, genetic testing, and to further clarify her future cancer risks, as well as potential cancer risks for family members.   In 2017, at the age of 38, Kaitlyn Morgan was diagnosed with colorectal cancer. This was treated with chemotherapy and radiation.  Surgery will be scheduled in about 6-7 weeks.  Kaitlyn Morgan presented with blood in her stool.  She has been treated for GERD for the last year.     CANCER HISTORY:  Oncology History   Rectal cancer [facility])   Staging form: Colon and Rectum, AJCC 7th Edition     Clinical stage from 12/03/2015: Stage IIIB (T3, N1, M0) - Signed by Truitt Merle, MD on 12/18/2015       Rectal cancer ([place])   12/03/2015 Initial Diagnosis Rectal cancer ([place])   12/03/2015 Procedure Colonoscopy showed a partially obstructing tumor in the proximal rectum, its diameter measured 6 mm, biopsed   12/03/2015 Initial Biopsy Rectum mass biopsy showed adenocarcinoma, moderately differentiated   12/10/2015 Imaging CT abdomen and pelvis with contrast showed luminal narrowing and mural thickening in the proximal to mid rectum. There is a 6 mm short axis mesorectal lymph node suspicious for metastasis. There are several tiny indeterminate liver lesions    12/17/2015 Imaging MRI pelvis with and without contrast showed a advanced T3 rectal adenocarcinoma (7cm), N1 disease (at least 2 left-sided nasal rectal lymph nodes are seen  measuring 6-7 mm), distance from tumor to the sphincter is 8 cm   12/17/2015 Imaging CT chest with contrast showed no evidence of metastasis or other changes     HORMONAL RISK FACTORS:  Menarche was at age 89.  First live birth at age 18.  OCP use for approximately 0 years.  Ovaries intact: yes.  Hysterectomy: yes.  Menopausal status: perimenopausal.  HRT use: 0 years. Colonoscopy: yes; rectal cancer. Mammogram within the last year: yes. Number of breast biopsies: 0. Up to date with pelvic exams:  yes. Any excessive radiation exposure in the past:  no  Past Medical History  Diagnosis Date  . Family history of breast cancer   . Family history of uterine cancer   . Colon cancer [facility])     rectal cancer    Past Surgical History  Procedure Laterality Date  . Cesarean section    . Abdominal hysterectomy    . Tubal ligation      Social History   Social History  . Marital Status: Legally Separated    Spouse Name: N/A  . Number of Children: 3  . Years of Education: N/A   Social History Main Topics  . Smoking status: Never Smoker   . Smokeless tobacco: Never Used  . Alcohol Use: No  . Drug Use: No  . Sexual Activity: Not Asked   Other Topics Concern  . None   Social History Narrative     FAMILY HISTORY:  We obtained a detailed, 4-generation family history.  Significant diagnoses are listed below: Family History  Problem Relation Age of Onset  . Breast cancer Maternal Grandmother     dx in her 74s  . Cancer Paternal Grandmother 53    uterine cancer   . Hypertension Father   . COPD Father   . Hypertension Sister   . Hypertension Paternal Grandfather   . Heart attack Paternal Grandfather   . Hypertension Sister   . Skin cancer Cousin     maternal first cousin    The patient has two sisters who are cancer free.  Her mother is alive at 42, and her father died from COPD at 1.  Her mother has one brother who is cancer free.  He has one daughter who has a skin  cancer, possibly melanoma.  Her maternal grandmother had breast cancer in her 67s and died at 20.  Her grandfather died of an infection.  The patient's father died from COPD.  He was an only child to his parents.  He had maternal half siblings - two brothers and a sister, who the patient thinks are cancer free.  He also had paternal half sibs, but is unaware of who they are.  Her father's mother had uterine cancer in her 73s.  Patient's maternal ancestors are of Caucasian descent, and paternal ancestors are of Caucasian and [place] descent. There is no reported Ashkenazi Jewish ancestry. There is no known consanguinity.  GENETIC COUNSELING ASSESSMENT: Kaitlyn Morgan is a 50 y.o. female with a personal and family history of cancer which is somewhat suggestive of a hereditary cancer syndrome and predisposition to cancer. We, therefore, discussed and recommended the following at today's visit.   DISCUSSION: We discussed that about 5-6% of colorectal cancer is hereditary with most cases due to Lynch syndrome.  With his paternal grandmother with uterine cancer and her father who died in his early 12s, there is a chance that the patient is at risk for Lynch syndrome. She has not had surgery yet, so no tumor testing has been performed.  We discussed that colon cancer is sometimes associated with breast cancer, in the case of CHEK2 and PTEN mutations, although these are more rare.  We reviewed the characteristics, features and inheritance patterns of hereditary cancer syndromes. We also discussed genetic testing, including the appropriate family members to test, the process of testing, insurance coverage and turn-around-time for results. We discussed the implications of a negative, positive and/or variant of uncertain significant result. We recommended Kaitlyn Morgan pursue genetic testing for the Comprehensive cancer gene panel. The Comprehensive Cancer Panel offered by [facility] includes sequencing and/or deletion  duplication testing of the following 32 genes: APC, ATM, AXIN2, BARD1, BMPR1A, BRCA1, BRCA2, BRIP1, CDH1, CDK4, CDKN2A, CHEK2, EPCAM, FANCC, MLH1, MSH2, MSH6, MUTYH, NBN, PALB2, PMS2, POLD1, POLE, PTEN, RAD51C, RAD51D, SCG5/GREM1, SMAD4, STK11, TP53, VHL, and XRCC2.     Based on Kaitlyn Morgan's personal and family history of cancer, she meets medical criteria for genetic testing. Despite that she meets criteria, she may still have an out of pocket cost. We discussed that if her out of pocket cost for testing is over $100, the laboratory will call and confirm whether she wants to proceed with testing.  If the out of pocket cost of testing is less than $100 she will be billed by the genetic testing laboratory.   PLAN: After considering the risks, benefits, and limitations, Kaitlyn Morgan  provided informed consent to pursue genetic testing and  the blood sample was sent to [facility] for analysis of the Comprehensive cancer panel. Results should be available within approximately 2-3 weeks' time, at which point they will be disclosed by telephone to Kaitlyn Morgan, as will any additional recommendations warranted by these results. Kaitlyn Morgan will receive a summary of her genetic counseling visit and a copy of her results once available. This information will also be available in Epic. We encouraged Kaitlyn Morgan to remain in contact with cancer genetics annually so that we can continuously update the family history and inform her of any changes in cancer genetics and testing that may be of benefit for her family. Kaitlyn Morgan questions were answered to her satisfaction today. Our contact information was provided should additional questions or concerns arise.  Lastly, we encouraged Kaitlyn Morgan to remain in contact with cancer genetics annually so that we can continuously update the family history and inform her of any changes in cancer genetics and testing that may be of benefit for this family.   Ms.   Morgan questions were answered to her satisfaction today. Our contact information was provided should additional questions or concerns arise. Thank you for the referral and allowing [place] to share in the care of your patient.   Alanzo Lamb P. Florene Glen, [place], [facility] Certified Genetic Counselor [email or web address] phone: [phone]  The patient was seen for a total of 45 minutes in face-to-face genetic counseling.  This patient was discussed with Drs. Magrinat, Lindi Adie and/or Burr Medico who agrees with the above.    _______________________________________________________________________ For Office Staff:  Number of people involved in session: 1 Was an Intern/ student involved with case: no

## 2016-02-02 ENCOUNTER — Ambulatory Visit (HOSPITAL_BASED_OUTPATIENT_CLINIC_OR_DEPARTMENT_OTHER): Payer: 59 | Admitting: Nurse Practitioner

## 2016-02-02 ENCOUNTER — Ambulatory Visit
Admission: RE | Admit: 2016-02-02 | Discharge: 2016-02-02 | Disposition: A | Discharge status: Home / Self Care | Payer: 59 | Source: Ambulatory Visit | Attending: Radiation Oncology | Admitting: Radiation Oncology

## 2016-02-02 ENCOUNTER — Encounter: Payer: Self-pay | Admitting: *Deleted

## 2016-02-02 VITALS — BP 108/66 | HR 70 | Temp 98.3°F | Resp 18 | Ht 62.0 in | Wt 169.0 lb

## 2016-02-02 DIAGNOSIS — Z510 Encounter for antineoplastic radiation therapy: Secondary | ICD-10-CM | POA: Diagnosis not present

## 2016-02-02 DIAGNOSIS — C20 Malignant neoplasm of rectum: Secondary | ICD-10-CM

## 2016-02-02 NOTE — Progress Notes (Signed)
  [place] OFFICE PROGRESS NOTE   Diagnosis:  Rectal cancer Oncology History   Rectal cancer [facility])  Staging form: Colon and Rectum, AJCC 7th Edition  Clinical stage from 12/03/2015: Stage IIIB (T3, N1, M0) - Signed by Truitt Merle, MD on 12/18/2015       Rectal cancer ([place])   12/03/2015 Initial Diagnosis Rectal cancer ([place])   12/03/2015 Procedure Colonoscopy showed a partially obstructing tumor in the proximal rectum, its diameter measured 6 cm, biopsed   12/03/2015 Initial Biopsy Rectum mass biopsy showed adenocarcinoma, moderately differentiated   12/10/2015 Imaging CT abdomen and pelvis with contrast showed luminal narrowing and mural thickening in the proximal to mid rectum. There is a 6 mm short axis mesorectal lymph node suspicious for metastasis. There are several tiny indeterminate liver lesions    12/17/2015 Imaging MRI pelvis with and without contrast showed a advanced T3 rectal adenocarcinoma (7cm), N1 disease (at least 2 left-sided nasal rectal lymph nodes are seen measuring 6-7 mm), distance from tumor to the sphincter is 8 cm   12/17/2015 Imaging CT chest with contrast showed no evidence of metastasis or other changes        INTERVAL HISTORY:   Ms. Willhoit returns as scheduled. She continues Xeloda/radiation.She has periodic nausea. No mouth sores. She is having small volume frequent loose stools. She estimates 3 small stool so far today.  Objective:  Vital signs in last 24 hours:  Blood pressure 108/66, pulse 70, temperature 98.3 F (36.8 C), temperature source Oral, resp. rate 18, height 5\' 2"  (1.575 m), weight 169 lb (76.658 kg), SpO2 100 %.    HEENT: No thrush or ulcers. Resp: Lungs clear bilaterally. Cardio: Regular rate and rhythm. GI: Abdomen soft and nontender. No hepatomegaly. Vascular: No leg edema.   Lab Results:  Lab Results  Component Value Date   WBC 2.7* 02/01/2016   HGB 11.8 02/01/2016   HCT 34.4*  02/01/2016   MCV 92.0 02/01/2016   PLT 150 02/01/2016   NEUTROABS 1.6 02/01/2016    Imaging:  No results found.  Medications: I have reviewed the patient's current medications.  Assessment/Plan: 1. Rectal adenocarcinoma, proximal rectum, cT3N1M0, stage IIIB, moderately differentiated. Initiation of radiation and Xeloda 12/30/2015.   Disposition: Ms. Dusseault appears stable. She continues radiation/Xeloda. She will complete the course of treatment on 02/08/2016. Dr. Burr Medico will see her in follow-up 02/08/2016.    Ned Card ANP/GNP-BC   02/02/2016  3:20 PM

## 2016-02-02 NOTE — Progress Notes (Signed)
Oncology Nurse Navigator Documentation  Oncology Nurse Navigator Flowsheets 02/02/2016  Navigator Location [facility]  Navigator Encounter Type Letter/Fax/Email  Abnormal Finding Date 12/03/2015  Confirmed Diagnosis Date 12/03/2015  Treatment Initiated Date 12/30/2015  Patient Visit Type MedOnc  Treatment Phase Active Tx--RT/Xeloda (complete on 6/26)  Barriers/Navigation Needs Coordination of Care  Education -  Interventions Referrals  Referrals Rehab--EPIC referral to Earlie Counts at [place] for pelvic rehab  Support Groups/Services -  Acuity Level 1  Time Spent with Patient 15

## 2016-02-03 ENCOUNTER — Ambulatory Visit
Admission: RE | Admit: 2016-02-03 | Discharge: 2016-02-03 | Disposition: A | Discharge status: Home / Self Care | Payer: 59 | Source: Ambulatory Visit | Attending: Radiation Oncology | Admitting: Radiation Oncology

## 2016-02-03 DIAGNOSIS — Z510 Encounter for antineoplastic radiation therapy: Secondary | ICD-10-CM | POA: Diagnosis not present

## 2016-02-04 ENCOUNTER — Telehealth: Payer: Self-pay | Admitting: Pharmacist

## 2016-02-04 ENCOUNTER — Ambulatory Visit
Admission: RE | Admit: 2016-02-04 | Discharge: 2016-02-04 | Disposition: A | Discharge status: Home / Self Care | Payer: 59 | Source: Ambulatory Visit | Attending: Radiation Oncology | Admitting: Radiation Oncology

## 2016-02-04 DIAGNOSIS — Z510 Encounter for antineoplastic radiation therapy: Secondary | ICD-10-CM | POA: Diagnosis not present

## 2016-02-04 NOTE — Telephone Encounter (Signed)
Attempted to reach pt by phone.  Voice mailbox full.  Re: Xeloda f/u and management. Pt completes tx 6/26 & is seeing Dr. Burr Medico that day.   We will f/u after that appt to determine further tx plan. Kennith Center, Pharm.D., CPP 02/04/2016@3 :[facility]

## 2016-02-05 ENCOUNTER — Ambulatory Visit
Admission: RE | Admit: 2016-02-05 | Discharge: 2016-02-05 | Disposition: A | Discharge status: Home / Self Care | Payer: 59 | Source: Ambulatory Visit | Attending: Radiation Oncology | Admitting: Radiation Oncology

## 2016-02-05 ENCOUNTER — Encounter: Payer: Self-pay | Admitting: Radiation Oncology

## 2016-02-05 VITALS — BP 108/72 | HR 82 | Temp 98.1°F | Resp 18 | Wt 171.0 lb

## 2016-02-05 DIAGNOSIS — C20 Malignant neoplasm of rectum: Secondary | ICD-10-CM

## 2016-02-05 DIAGNOSIS — B373 Candidiasis of vulva and vagina: Secondary | ICD-10-CM

## 2016-02-05 DIAGNOSIS — Z510 Encounter for antineoplastic radiation therapy: Secondary | ICD-10-CM | POA: Diagnosis not present

## 2016-02-05 DIAGNOSIS — B3731 Acute candidiasis of vulva and vagina: Secondary | ICD-10-CM

## 2016-02-05 MED ORDER — FLUCONAZOLE 100 MG PO TABS: 100.0000 mg | ORAL_TABLET | Freq: Every day | ORAL | Status: DC

## 2016-02-05 NOTE — Progress Notes (Addendum)
Weekly rad tx rectum 27/28 completed, still having loose stool,s, and cramping in abdomen, not taking  Anything for this, completed diflucan for itching in vaginal area, but c/o still itching checked patient bottom and front, no skin breakdown, seen says when voiding dysuria at times, uses sitz baths prn,  Peri sray bottle and baby wipes 1 month appt given   5:05 PM BP 108/72 mmHg  Pulse 82  Temp(Src) 98.1 F (36.7 C) (Oral)  Resp 18  Wt 171 lb (77.565 kg)  Wt Readings from Last 3 Encounters:  02/05/16 171 lb (77.565 kg)  02/02/16 169 lb (76.658 kg)  01/29/16 172 lb 11.2 oz (78.336 kg)

## 2016-02-05 NOTE — Progress Notes (Signed)
  Radiation Oncology         [phone]   Name: Kaitlyn Morgan MRN: [id number]   Date: 02/05/2016  DOB: February 11, 1966   Weekly Radiation Therapy Management    ICD-9-CM ICD-10-CM   1. Vaginal candidiasis 112.1 B37.3 fluconazole (DIFLUCAN) 100 MG tablet  2. Rectal cancer ([facility]) 154.1 C20     Current Dose: 48.6 Gy  Planned Dose:  50.4 Gy  Narrative The patient presents for routine under treatment assessment.  Weekly rad tx rectum 27/28 completed. She is still having loose stools and abdominal cramping. She is not taking anything for this. At first she had constipation and was taking Miralax. She stopped taking the Miralax when she started having diarrhea. She reports 4 or more episodes of diarrhea a day. She completed diflucan for itching in the vaginal area, but still c/o itching. The nurse checked the patient's bottom and front and described no skin breakdown. The patient reports dysuria at times and uses sitz baths prn. Uses Peri sray bottle and baby wipes.  Set-up films were reviewed. The chart was checked.  Physical Findings  weight is 171 lb (77.565 kg). Her oral temperature is 98.1 F (36.7 C). Her blood pressure is 108/72 and her pulse is 82. Her respiration is 18. . Weight essentially stable.  No significant changes.  Impression The patient is tolerating radiation.  Plan Continue treatment as planned. The patient completes radiation next Monday and will follow up in 1 month. I advised the patient to use Imodium AD and/or gas-x for the diarrhea and abdominal cramping. I will refill the Diflucan with a 7 day supply.    Sheral Apley Tammi Klippel, M.D.  This document serves as a record of services personally performed by Tyler Pita, MD. It was created on his behalf by Darcus Austin, a trained medical scribe. The creation of this record is based on the scribe's personal observations and the provider's statements to them. This document has been checked and approved by the attending provider.

## 2016-02-08 ENCOUNTER — Encounter: Payer: Self-pay | Admitting: Radiation Oncology

## 2016-02-08 ENCOUNTER — Ambulatory Visit (HOSPITAL_BASED_OUTPATIENT_CLINIC_OR_DEPARTMENT_OTHER): Payer: 59

## 2016-02-08 ENCOUNTER — Ambulatory Visit (HOSPITAL_BASED_OUTPATIENT_CLINIC_OR_DEPARTMENT_OTHER): Payer: 59 | Admitting: Hematology

## 2016-02-08 ENCOUNTER — Other Ambulatory Visit: Payer: Self-pay | Admitting: *Deleted

## 2016-02-08 ENCOUNTER — Telehealth: Payer: Self-pay | Admitting: Hematology

## 2016-02-08 ENCOUNTER — Ambulatory Visit
Admission: RE | Admit: 2016-02-08 | Discharge: 2016-02-08 | Disposition: A | Discharge status: Home / Self Care | Payer: 59 | Source: Ambulatory Visit | Attending: Radiation Oncology | Admitting: Radiation Oncology

## 2016-02-08 ENCOUNTER — Encounter: Payer: Self-pay | Admitting: Hematology

## 2016-02-08 VITALS — BP 110/68 | HR 78 | Temp 97.9°F | Resp 18 | Ht 62.0 in | Wt 168.7 lb

## 2016-02-08 DIAGNOSIS — R110 Nausea: Secondary | ICD-10-CM | POA: Diagnosis not present

## 2016-02-08 DIAGNOSIS — C20 Malignant neoplasm of rectum: Secondary | ICD-10-CM

## 2016-02-08 DIAGNOSIS — N390 Urinary tract infection, site not specified: Secondary | ICD-10-CM

## 2016-02-08 DIAGNOSIS — Z510 Encounter for antineoplastic radiation therapy: Secondary | ICD-10-CM | POA: Diagnosis not present

## 2016-02-08 DIAGNOSIS — R197 Diarrhea, unspecified: Secondary | ICD-10-CM

## 2016-02-08 LAB — URINALYSIS, MICROSCOPIC - CHCC
Bilirubin (Urine): NEGATIVE
Glucose: NEGATIVE mg/dL
Ketones: NEGATIVE mg/dL
Leukocyte Esterase: NEGATIVE
Nitrite: NEGATIVE
Protein: NEGATIVE mg/dL
Specific Gravity, Urine: 1.03 (ref 1.003–1.035)
Urobilinogen, UR: 0.2 mg/dL (ref 0.2–1)
pH: 6 (ref 4.6–8.0)

## 2016-02-08 NOTE — Progress Notes (Signed)
[place]  Telephone:([phone]) [phone] Fax:(336) [phone]  Clinic follow Up Note   Patient Care Team: Leandrew Koyanagi, MD as PCP - General (Internal Medicine) Truitt Merle, MD as Consulting Physician (Hematology) Kyung Rudd, MD as Consulting Physician (Radiation Oncology) Jackolyn Confer, MD as Consulting Physician (General Surgery) Richmond Campbell, MD as Consulting Physician (Gastroenterology) Milus Banister, MD as Attending Physician (Gastroenterology) 02/08/2016   CHIEF COMPLAINTS:  Follow up rectal cancer  Oncology History   Rectal cancer [facility])   Staging form: Colon and Rectum, AJCC 7th Edition     Clinical stage from 12/03/2015: Stage IIIB (T3, N1, M0) - Signed by Truitt Merle, MD on 12/18/2015       Rectal cancer ([place])   12/03/2015 Initial Diagnosis Rectal cancer ([place])   12/03/2015 Procedure Colonoscopy showed a partially obstructing tumor in the proximal rectum, its diameter measured 6 mm, biopsed   12/03/2015 Initial Biopsy Rectum mass biopsy showed adenocarcinoma, moderately differentiated   12/10/2015 Imaging CT abdomen and pelvis with contrast showed luminal narrowing and mural thickening in the proximal to mid rectum. There is a 6 mm short axis mesorectal lymph node suspicious for metastasis. There are several tiny indeterminate liver lesions    12/17/2015 Imaging MRI pelvis with and without contrast showed a advanced T3 rectal adenocarcinoma (7cm), N1 disease (at least 2 left-sided nasal rectal lymph nodes are seen measuring 6-7 mm), distance from tumor to the sphincter is 8 cm   12/17/2015 Imaging CT chest with contrast showed no evidence of metastasis or other changes    HISTORY OF PRESENTING ILLNESS:  Arlo Ouida Sills 50 y.o. female is here because of Her newly diagnosed rectal cancer. She is accompanied by her daughter and her mother to our multidisciplinary GI clinic today.  She has had chronic abdominal pain for 2-3 years, in the epigastric area and low  abdomen, which has been worse lately, and she noticed constipation, abdominal bloating and mild rectal bleeding for 2 months. She was referred to Dr. Earlean Shawl and underwent colonoscopy on 12/03/2015. A partial obstructive rectal mass was found in the proximal rectum, biopsy showed adenocarcinoma. She was referred to surgeon Dr. Zella Richer who referred her to [place] to consider new adjuvant chemoradiation.  She feels well overall, has mild fatigue, but able to tolerate her routine full-time job in daily activities without difficulties. She has good appetite, but eats less due to the epigastric pain after meal. She denies any other significant pain, cough, or other symptoms. No recent weight loss. She has been on phentermine for weight loss recently, and stopped a few days ago.  CURRENT THERAPY: concurrent chemotherapy and irradiation with Xeloda 1500 mg twice daily Monday through Friday, started on 12/30/2015  INTERIM HISTORY:  Mrs Athanas returns for follow up. Today is her last treatment of neoadjuvant chemoradiation. She has been tolerating treatment well overall, still has a mild nausea and diarrhea, she takes Imodium. No significant pain, no other complaints. Her appetite is low, she has lost 3 pounds in the last week. No other new complaints  MEDICAL HISTORY:  Past Medical History  Diagnosis Date  . Family history of breast cancer   . Family history of uterine cancer   . Colon cancer [facility])     rectal cancer    SURGICAL HISTORY: Past Surgical History  Procedure Laterality Date  . Cesarean section    . Abdominal hysterectomy    . Tubal ligation      SOCIAL HISTORY: Social History   Social History  .  Marital Status: Legally Separated    Spouse Name: N/A  . Number of Children: 3  . Years of Education: N/A   Occupational History  . Not on file.   Social History Main Topics  . Smoking status: Never Smoker   . Smokeless tobacco: Never Used  . Alcohol Use: No  . Drug Use: No  .  Sexual Activity: Not on file   Other Topics Concern  . Not on file   Social History Narrative   She has 3 children, she lives with her twin sons who are 30. She works for [employer]   FAMILY HISTORY: Family History  Problem Relation Age of Onset  . Breast cancer Maternal Grandmother     dx in her 65s  . Cancer Paternal Grandmother 18    uterine cancer   . Hypertension Father   . COPD Father   . Hypertension Sister   . Hypertension Paternal Grandfather   . Heart attack Paternal Grandfather   . Hypertension Sister   . Skin cancer Cousin     maternal first cousin    ALLERGIES:  is allergic to morphine and related and zolpidem tartrate.  MEDICATIONS:  Current Outpatient Prescriptions  Medication Sig Dispense Refill  . capecitabine (XELODA) 500 MG tablet Take 3 tablets (1,500 mg total) by mouth 2 (two) times daily after a meal. 90 tablet 1  . fexofenadine (ALLEGRA) 180 MG tablet Take 180 mg by mouth daily. Reported on 02/05/2016    . fluconazole (DIFLUCAN) 100 MG tablet Take 1 tablet (100 mg total) by mouth daily. Take 2 on first day then one daily 8 tablet 0  . fluticasone (FLONASE) 50 MCG/ACT nasal spray 1 spray each nostril twice a day (Patient not taking: Reported on 02/05/2016) 16 g 6  . Loratadine (CLARITIN PO) Take by mouth. Reported on 02/05/2016    . nystatin ointment (MYCOSTATIN) Apply 1 application topically 2 (two) times daily. (Patient not taking: Reported on 02/05/2016) 30 g 0  . ondansetron (ZOFRAN) 8 MG tablet Take 1 tablet (8 mg total) by mouth every 8 (eight) hours as needed for nausea. (Patient not taking: Reported on 02/05/2016) 30 tablet 3  . Polyethylene Glycol 3350 (MIRALAX PO) Take 17 g by mouth daily as needed. Reported on 02/05/2016    . prochlorperazine (COMPAZINE) 10 MG tablet Take 1 tablet (10 mg total) by mouth every 8 (eight) hours as needed for nausea. (Patient not taking: Reported on 02/05/2016) 20 tablet 2  . senna-docusate (SENOKOT S) 8.6-50 MG  tablet Take 2 tablets by mouth at bedtime. Reported on 02/05/2016     No current facility-administered medications for this visit.    REVIEW OF SYSTEMS:   Constitutional: Denies fevers, chills or abnormal night sweats Eyes: Denies blurriness of vision, double vision or watery eyes Ears, nose, mouth, throat, and face: Denies mucositis or sore throat Respiratory: Denies cough, dyspnea or wheezes Cardiovascular: Denies palpitation, chest discomfort or lower extremity swelling Gastrointestinal:  Denies nausea, heartburn or change in bowel habits Skin: Denies abnormal skin rashes Lymphatics: Denies new lymphadenopathy or easy bruising Neurological:Denies numbness, tingling or new weaknesses Behavioral/Psych: Mood is stable, no new changes  All other systems were reviewed with the patient and are negative.  PHYSICAL EXAMINATION: ECOG PERFORMANCE STATUS: 1 - Symptomatic but completely ambulatory  Filed Vitals:   02/08/16 1556  BP: 110/68  Pulse: 78  Temp: 97.9 F (36.6 C)  Resp: 18   Filed Weights   02/08/16 1556  Weight: 168 lb  11.2 oz (76.522 kg)    GENERAL:alert, no distress and comfortable SKIN: skin color, texture, turgor are normal, no rashes or significant lesions EYES: normal, conjunctiva are pink and non-injected, sclera clear OROPHARYNX:no exudate, no erythema and lips, buccal mucosa, and tongue normal  NECK: supple, thyroid normal size, non-tender, without nodularity LYMPH:  no palpable lymphadenopathy in the cervical, axillary or inguinal LUNGS: clear to auscultation and percussion with normal breathing effort HEART: regular rate & rhythm and no murmurs and no lower extremity edema ABDOMEN:abdomen soft, non-tender and normal bowel sounds.  Musculoskeletal:no cyanosis of digits and no clubbing  PSYCH: alert & oriented x 3 with fluent speech NEURO: no focal motor/sensory deficits  LABORATORY DATA:  I have reviewed the data as listed CBC Latest Ref Rng 02/01/2016  01/25/2016 01/18/2016  WBC 3.9 - 10.3 10e3/uL 2.7(L) 2.8(L) 3.0(L)  Hemoglobin 11.6 - 15.9 g/dL 11.8 11.6 11.8  Hematocrit 34.8 - 46.6 % 34.4(L) 35.1 35.7  Platelets 145 - 400 10e3/uL 150 181 201    CMP Latest Ref Rng 02/01/2016 01/25/2016 01/18/2016  Glucose 70 - 140 mg/dl 86 86 64(L)  BUN 7.0 - 26.0 mg/dL 7.5 9.9 7.7  Creatinine 0.6 - 1.1 mg/dL 0.8 0.7 0.8  Sodium 136 - 145 mEq/L 142 141 142  Potassium 3.5 - 5.1 mEq/L 3.7 4.1 4.2  CO2 22 - 29 mEq/L '27 26 29  ' Calcium 8.4 - 10.4 mg/dL 9.0 8.7 9.0  Total Protein 6.4 - 8.3 g/dL 7.1 6.8 7.1  Total Bilirubin 0.20 - 1.20 mg/dL 2.07(H) 0.97 0.91  Alkaline Phos 40 - 150 U/L 64 61 61  AST 5 - 34 U/L '24 24 18  ' ALT 0 - 55 U/L '20 20 10     ' PATHOLOGY REPORT Diagnosis 12/03/2015 Colon, sigmoid, mass, biopsy -Invasive adenocarcinoma, moderately differentiated. See comment.  Comment: due to the presence of adenocarcinoma, IHC for DNA mismatch repair proteins will be performed and reported in an addendum.   RADIOGRAPHIC STUDIES: I have personally reviewed the radiological images as listed and agreed with the findings in the report. No results found. COLONOSCOPY DR. MEDOFF 12/03/2015  Impression Malignant partially obstructing tumor in the proximal rectum, its diameter measured 6 mm, biopsy. Distal margin at 15 cm.    ASSESSMENT & PLAN:  50 year old Caucasian female, without significant past medical history, presented with abdominal pain and rectal bleeding. Colonoscopy showed a obstructing tumor in the proximal rectum.  1. Rectal adenocarcinoma, proximal rectum, cT3N1M0, stage IIIB, moderately differentiated --I have reviewed her colonoscopy, EUS, MRI of pelvis, CT of abdomen pelvis finding with pt and her family in details -The pelvic MRI showed a T3 lesion, 2 suspicious lymph nodes, likely state IIIB disease. She also was found to have multiple small indeterminate hypodense lesion in the liver, which was not found on her prior CT scan in 2007.  We will order a abdominal MRI to further look into the liver lesions.  -CT chest was negative for distant metastasis. Abdominal MRI showed multiple cysts in the liver, spleen and right kidney. No concerning for metastasis. -We reviewed the natural history of rectal cancer, risk of recurrence after surgery, depending on the stage. We discussed the treatment option for stage IIIB rectal cancer. The standard of care is neoadjuvant chemotherapy and radiation, followed by surgery, then adjuvant chemotherapy. -she has started concurrent chemoradiation with  Xeloda, tolerating moderately well, will complete today  -She was initially seen by Dr. Zella Richer. But if she is interested in robotic-assisted surgery, she will probably see Dr.  Thomas. I encouraged her to call surgeon's office to request change provider.   2. Mild diarrhea and Nausea -she takes Compazine as needed. Her nausea has been on mild lately. -She use Imodium as needed  3. Elevated bilirubin -Her lab work last week showed elevated bilirubin, liver enzymes were normal, probably related to Xeloda. She is asymptomatic.  -We'll repeat lab in 2 weeks    Plan -complete chemotherapy and radiation. She did not have lab today, I encouraged her to return in 2 weeks for repeated lab  -I'll see her back in 1 month's -She will call Dr. Bertrum Sol office, and the possible switch to Dr. Marcello Moores  All questions were answered. The patient knows to call the clinic with any problems, questions or concerns. I spent 15 minutes counseling the patient face to face. The total time spent in the appointment was 20 minutes and more than 50% was on counseling.     Truitt Merle, MD 02/08/2016 9:14 AM

## 2016-02-08 NOTE — Telephone Encounter (Signed)
Gave and printed appt sched and avs for pt for July  °

## 2016-02-09 LAB — URINE CULTURE: Organism ID, Bacteria: NO GROWTH

## 2016-02-11 ENCOUNTER — Telehealth: Payer: Self-pay | Admitting: *Deleted

## 2016-02-11 NOTE — Telephone Encounter (Signed)
Oncology Nurse Navigator Documentation  Oncology Nurse Navigator Flowsheets 02/11/2016  Navigator Location [facility]  Navigator Encounter Type Telephone  Telephone Outgoing Call;Patient Update  Abnormal Finding Date -  Confirmed Diagnosis Date -  Treatment Initiated Date -  Patient Visit Type -  Treatment Phase Treatment RT/Xeloda completed  Barriers/Navigation Needs Coordination of Care;Education--asking about elevated bili and what can cause this and what MD will do if it is still elevated? Asking when she should see Dr. Marcello Moores? Has not heard from PT regarding referral placed on 02/02/16  Education -Xeloda can cause elevated bili-most likely if still at same level, may repeat lab later; doubt she needs a scan unless it gets very high. Highly likely it will normalize when she is off the Xeloda awhile. Also informed her that her urine culture was negative for bacteria.  Interventions -Left message for [place] to follow up on referral in EPIC placed on 6/20 and call patient/ inbasket to Dr. Burr Medico to inquire when to get her back to Dr. Marcello Moores.  Referrals -  Support Groups/Services -  Acuity -  Time Spent with Patient -15

## 2016-02-19 ENCOUNTER — Telehealth: Payer: Self-pay | Admitting: Genetic Counselor

## 2016-02-19 ENCOUNTER — Encounter: Payer: Self-pay | Admitting: *Deleted

## 2016-02-19 ENCOUNTER — Ambulatory Visit: Payer: Self-pay | Admitting: Genetic Counselor

## 2016-02-19 DIAGNOSIS — Z803 Family history of malignant neoplasm of breast: Secondary | ICD-10-CM

## 2016-02-19 DIAGNOSIS — Z1379 Encounter for other screening for genetic and chromosomal anomalies: Secondary | ICD-10-CM | POA: Insufficient documentation

## 2016-02-19 DIAGNOSIS — Z8049 Family history of malignant neoplasm of other genital organs: Secondary | ICD-10-CM

## 2016-02-19 DIAGNOSIS — C20 Malignant neoplasm of rectum: Secondary | ICD-10-CM

## 2016-02-19 NOTE — Progress Notes (Signed)
[place] Work  Clinical Social Work was referred by patient for ADR completion.  Clinical Education officer, museum and pt to meet on Wed. July 12 at 3pm to complete ADRs. No other needs noted currently.    Loren Racer, [place] Worker [place]  [place] Phone: [phone] Fax: [phone]

## 2016-02-19 NOTE — Progress Notes (Signed)
HPI: Ms. Cid was previously seen in the [facility] clinic due to a personal history of cancer and concerns regarding a hereditary predisposition to cancer. Please refer to our prior cancer genetics clinic note for more information regarding Ms. Musselman's medical, social and family histories, and our assessment and recommendations, at the time. Ms. Rodda's recent genetic test results were disclosed to her, as were recommendations warranted by these results. These results and recommendations are discussed in more detail below.  FAMILY HISTORY:  We obtained a detailed, 4-generation family history.  Significant diagnoses are listed below: Family History  Problem Relation Age of Onset  . Breast cancer Maternal Grandmother     dx in her 32s  . Cancer Paternal Grandmother 59    uterine cancer   . Hypertension Father   . COPD Father   . Hypertension Sister   . Hypertension Paternal Grandfather   . Heart attack Paternal Grandfather   . Hypertension Sister   . Skin cancer Cousin     maternal first cousin    The patient has two sisters who are cancer free. Her mother is alive at 47, and her father died from COPD at 56. Her mother has one brother who is cancer free. He has one daughter who has a skin cancer, possibly melanoma. Her maternal grandmother had breast cancer in her 52s and died at 64. Her grandfather died of an infection. The patient's father died from COPD. He was an only child to his parents. He had maternal half siblings - two brothers and a sister, who the patient thinks are cancer free. He also had paternal half sibs, but is unaware of who they are. Her father's mother had uterine cancer in her 28s. Patient's maternal ancestors are of Caucasian descent, and paternal ancestors are of Caucasian and [place] descent. There is no reported Ashkenazi Jewish ancestry. There is no known consanguinity.  GENETIC TEST RESULTS: At the time of Ms. Freeman's visit, we  recommended she pursue genetic testing of the Comprehensive cancer gene panel. The Comprehensive Cancer Panel offered by [facility] includes sequencing and/or deletion duplication testing of the following 32 genes: APC, ATM, AXIN2, BARD1, BMPR1A, BRCA1, BRCA2, BRIP1, CDH1, CDK4, CDKN2A, CHEK2, EPCAM, FANCC, MLH1, MSH2, MSH6, MUTYH, NBN, PALB2, PMS2, POLD1, POLE, PTEN, RAD51C, RAD51D, SCG5/GREM1, SMAD4, STK11, TP53, VHL, and XRCC2.   The report date is February 18, 2016.  Genetic testing was normal, and did not reveal a deleterious mutation in these genes. The test report has been scanned into EPIC and is located under the Molecular Pathology section of the Results Review tab.   We discussed with Ms. Morua that since the current genetic testing is not perfect, it is possible there may be a gene mutation in one of these genes that current testing cannot detect, but that chance is small. We also discussed, that it is possible that another gene that has not yet been discovered, or that we have not yet tested, is responsible for the cancer diagnoses in the family, and it is, therefore, important to remain in touch with cancer genetics in the future so that we can continue to offer Ms. Crumble the most up to date genetic testing.   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 At this time, it is unknown if this variant is associated with increased cancer risk or if this is a normal finding, but most variants such as this get reclassified to being inconsequential. It  should not be used to make medical management decisions. With time, we suspect the lab will determine the significance of this variant, if any. If we do learn more about it, we will try to contact Ms. Corkery to discuss it further. However, it is important to stay in touch with [place] periodically and keep the address and phone number up to date.   CANCER SCREENING RECOMMENDATIONS: This negative genetic test  simply tells [place] that we cannot yet define why Ms. Mehlberg has had colorectal cancer at a young age. Ms. Vanhook's medical management and screening should be based on the prospect that she may be at an increased risk for a second colorectal cancer in the future and should, therefore, undergo more frequent colonoscopy screening at intervals determined by her GI providers.    RECOMMENDATIONS FOR FAMILY MEMBERS: Women in this family might be at some increased risk of developing cancer, over the general population risk, simply due to the family history of cancer. We recommended women in this family have a yearly mammogram beginning at age 1, or 72 years younger than the earliest onset of cancer, an an annual clinical breast exam, and perform monthly breast self-exams. Women in this family should also have a gynecological exam as recommended by their primary provider. All family members should have a colonoscopy by age 16.  FOLLOW-UP: Lastly, we discussed with Ms. Abbruzzese that cancer genetics is a rapidly advancing field and it is possible that new genetic tests will be appropriate for her and/or her family members in the future. We encouraged her to remain in contact with cancer genetics on an annual basis so we can update her personal and family histories and let her know of advances in cancer genetics that may benefit this family.   Our contact number was provided. Ms. Peter's questions were answered to her satisfaction, and she knows she is welcome to call us at anytime with additional questions or concerns.   Roma Kayser, MS, [facility] Certified Genetic Counselor [email or web address]

## 2016-02-19 NOTE — Telephone Encounter (Signed)
Revealed negative genetic testing on the comprehensive cancer panel and MSH2 inversion analysis.  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  It is unclear if this is a benign or pathogenci change.  Discussed that most are benign therefore this results will be treated as negative.  Overtime this variant will be reclassified and we will call and follow up. It should not be treated as clinically relevant.  Patient voiced understanding.

## 2016-02-22 ENCOUNTER — Other Ambulatory Visit (HOSPITAL_BASED_OUTPATIENT_CLINIC_OR_DEPARTMENT_OTHER): Payer: 59

## 2016-02-22 DIAGNOSIS — C20 Malignant neoplasm of rectum: Secondary | ICD-10-CM

## 2016-02-22 LAB — COMPREHENSIVE METABOLIC PANEL
ALT: 14 U/L (ref 0–55)
AST: 25 U/L (ref 5–34)
Albumin: 3.8 g/dL (ref 3.5–5.0)
Alkaline Phosphatase: 92 U/L (ref 40–150)
Anion Gap: 8 mEq/L (ref 3–11)
BUN: 9.5 mg/dL (ref 7.0–26.0)
CO2: 29 mEq/L (ref 22–29)
Calcium: 9.2 mg/dL (ref 8.4–10.4)
Chloride: 106 mEq/L (ref 98–109)
Creatinine: 0.8 mg/dL (ref 0.6–1.1)
EGFR: 87 mL/min/{1.73_m2} — ABNORMAL LOW (ref >90)
Glucose: 86 mg/dl (ref 70–140)
Potassium: 3.6 mEq/L (ref 3.5–5.1)
Sodium: 144 mEq/L (ref 136–145)
Total Bilirubin: 1.59 mg/dL — ABNORMAL HIGH (ref 0.20–1.20)
Total Protein: 7.2 g/dL (ref 6.4–8.3)

## 2016-02-22 LAB — CBC WITH DIFFERENTIAL/PLATELET
BASO%: 1.1 % (ref 0.0–2.0)
Basophils Absolute: 0 10*3/uL (ref 0.0–0.1)
EOS%: 7.9 % — ABNORMAL HIGH (ref 0.0–7.0)
Eosinophils Absolute: 0.2 10*3/uL (ref 0.0–0.5)
HCT: 38.3 % (ref 34.8–46.6)
HGB: 12.8 g/dL (ref 11.6–15.9)
LYMPH%: 15.4 % (ref 14.0–49.7)
MCH: 31.7 pg (ref 25.1–34.0)
MCHC: 33.5 g/dL (ref 31.5–36.0)
MCV: 94.8 fL (ref 79.5–101.0)
MONO#: 0.3 10*3/uL (ref 0.1–0.9)
MONO%: 10.7 % (ref 0.0–14.0)
NEUT#: 2 10*3/uL (ref 1.5–6.5)
NEUT%: 64.9 % (ref 38.4–76.8)
Platelets: 188 10*3/uL (ref 145–400)
RBC: 4.04 10*6/uL (ref 3.70–5.45)
RDW: 19.7 % — ABNORMAL HIGH (ref 11.2–14.5)
WBC: 3 10*3/uL — ABNORMAL LOW (ref 3.9–10.3)
lymph#: 0.5 10*3/uL — ABNORMAL LOW (ref 0.9–3.3)

## 2016-02-25 ENCOUNTER — Encounter: Payer: Self-pay | Admitting: Physical Therapy

## 2016-02-25 ENCOUNTER — Encounter: Payer: Self-pay | Admitting: *Deleted

## 2016-02-25 ENCOUNTER — Ambulatory Visit: Payer: 59 | Attending: Hematology | Admitting: Physical Therapy

## 2016-02-25 DIAGNOSIS — M6281 Muscle weakness (generalized): Secondary | ICD-10-CM | POA: Insufficient documentation

## 2016-02-25 DIAGNOSIS — M62838 Other muscle spasm: Secondary | ICD-10-CM

## 2016-02-25 NOTE — Progress Notes (Signed)
Oncology Nurse Navigator Documentation  Oncology Nurse Navigator Flowsheets 02/25/2016  Navigator Location [facility]  Navigator Encounter Type Letter/Fax/Email  Telephone -  Abnormal Finding Date -  Confirmed Diagnosis Date -  Treatment Initiated Date -  Patient Visit Type -  Treatment Phase -  Barriers/Navigation Needs Coordination of Care--surgeon follow up  Education -  Interventions Coordination of Care--message sent to Mammie Lorenzo with [facility] regarding appointment to see Dr. Marcello Moores since Xeloda/RT completed on 02/08/16.  Referrals -  Coordination of Care Appts  Support Groups/Services -  Acuity -  Time Spent with Patient 15

## 2016-02-25 NOTE — Therapy (Signed)
[facility] [address] [place], [place], [address] Phone: [phone]   Fax:  [phone]  Physical Therapy Evaluation  Patient Details  Name: Kaitlyn Morgan MRN: [id number] Date of Birth: November 16, 1965 Referring Provider: Dr. Truitt Merle  Encounter Date: 02/25/2016      PT End of Session - 02/25/16 1701    Visit Number 1   Date for PT Re-Evaluation 04/21/16   PT Start Time 1628  took 15 min to fill out questionaire   PT Stop Time 1700   PT Time Calculation (min) 32 min   Activity Tolerance Patient tolerated treatment well   Behavior During Therapy [facility] for tasks assessed/performed      Past Medical History  Diagnosis Date  . Family history of breast cancer   . Family history of uterine cancer   . Colon cancer [facility])     rectal cancer    Past Surgical History  Procedure Laterality Date  . Cesarean section    . Abdominal hysterectomy    . Tubal ligation      There were no vitals filed for this visit.       Subjective Assessment - 02/25/16 1627    Subjective Patient has completed the radiation and xeloday on 02/08/2016. Patient has fecal urgency that is worse. Patient has had 3 fecal accidents since diagnosed with cancer. Patient is having surgery end of august. Patient may need an iliostomy bag.    Patient Stated Goals understand how to strengthen the pelvic floor   Currently in Pain? Yes   Pain Score 8   low 5/10   Pain Location Rectum   Pain Orientation Mid   Pain Descriptors / Indicators Sore   Pain Type Acute pain   Pain Onset More than a month ago   Pain Frequency Intermittent   Aggravating Factors  sitting   Pain Relieving Factors not sitting            [facility] PT Assessment - 02/25/16 0001    Assessment   Medical Diagnosis C20Rectal cancer   Referring Provider Dr. Truitt Merle   Onset Date/Surgical Date 12/03/15   Prior Therapy NOne   Precautions   Precautions Other (comment)   Precaution Comments Cancer precautions   Restrictions   Weight Bearing Restrictions No   Balance Screen   Has the patient fallen in the past 6 months No   Has the patient had a decrease in activity level because of a fear of falling?  No   Is the patient reluctant to leave their home because of a fear of falling?  No   Home Ecologist residence   Prior Function   Level of Independence Independent   Vocation Full time employment   Vocation Requirements clerical work   Cognition   Overall Cognitive Status Within Functional Limits for tasks assessed   Observation/Other Assessments   Focus on Therapeutic Outcomes (FOTO)  48% limitation  38% limitation goal   ROM / Strength   AROM / PROM / Strength AROM;Strength   AROM   Overall AROM Comments lumbar ROM is limited by 25%   Strength   Overall Strength Comments left hip extension 3+/5; abdominal strength 3/5                 Pelvic Floor Special Questions - 02/25/16 0001    Urinary Leakage No   Urinary frequency fecal urgency   Fecal incontinence --  5 bowel movements per day,  small amounts   External Perineal Exam the anal area is intact   Skin Integrity Intact  dryness   Perineal Body/Introitus  Elevated   Pelvic Floor Internal Exam Patient has confirmed identification    Exam Type Vaginal   Palpation no tenderness   Strength fair squeeze, definite lift                  PT Education - 02/25/16 1701    Education provided No          PT Short Term Goals - 02/25/16 1710    PT SHORT TERM GOAL #1   Title understand how to manage vaginal dryness   Time 4   Period Weeks   Status New   PT SHORT TERM GOAL #2   Title when have the urge for a bowel movement able to wait 5 min   Time 4   Period Weeks   Status New   PT SHORT TERM GOAL #3   Title independent with flexibility exercises   Time 4   Period Weeks   Status New   PT SHORT TERM GOAL #4   Title understand correct  toileting technique   Period Weeks   Status New           PT Long Term Goals - 02/25/16 1711    PT LONG TERM GOAL #1   Title independent with HEP   Time 8   Period Weeks   Status New   PT LONG TERM GOAL #2   Title pelvic floor strength is >/= 4/5   Time 8   Period Weeks   Status New   PT LONG TERM GOAL #3   Title ability to hold bowel movement to get to the bathroom in time without urgency   Time 8   Period Weeks   PT LONG TERM GOAL #4   Title pain with sitting decreased >/= 50% due to improved tissue flexibility   Time 8   Period Weeks   Status New   PT LONG TERM GOAL #5   Title understand how to use a dilator to maintain the vaginal opening after radiation   Time 8   Period Weeks   Status New               Plan - 02/25/16 1702    Clinical Impression Statement Patient is a 50 year old female with diagnosis of rectal cancer. Patient has completed radiation and Xeloda on 02/08/2016. Patient is having fecal urgency and 3 accidents since 11/2015.  Pelvic floor strength is 3/5.  Patient has vaginal dryness.  Patient will be having surgery at end of August to remove tumor and possible ilieostomy bag.  Patient reports no urinary leakage.  Patient has 4 small bowel movements per day.  Patient has 8/10 pain in the vaginal/rectal area with sitting and tightness in bilateral thighs. Patient is of moderate complexity.  Patient will benefit from physical therapy to improve pelvic floor strength and education on how to take care of the vaginal area.    Rehab Potential Good   Clinical Impairments Affecting Rehab Potential will be having surgery for the rectal tumor in end of August   PT Frequency 1x / week   PT Duration 8 weeks   PT Treatment/Interventions Biofeedback;Therapeutic activities;Therapeutic exercise;Patient/family education;Neuromuscular re-education;Manual techniques   PT Next Visit Plan pelvic floor strength; hip flexibility; abdominal massage; education on vaginal  lubrication   PT Home Exercise Plan progress as needed   Recommended  Other Services none   Consulted and Agree with Plan of Care Patient      Patient will benefit from skilled therapeutic intervention in order to improve the following deficits and impairments:  Pain, Decreased coordination, Decreased endurance, Decreased strength  Visit Diagnosis: Muscle weakness (generalized) - Plan: PT plan of care cert/re-cert  Other muscle spasm - Plan: PT plan of care cert/re-cert     Problem List Patient Active Problem List   Diagnosis Date Noted  . Genetic testing 02/19/2016  . Family history of breast cancer   . Family history of uterine cancer   . Rectal cancer ([place]) 12/18/2015    Earlie Counts, PT 02/25/2016 5:15 PM   [facility] [address] [place], [place], [address] Phone: [phone]   Fax:  [phone]  Name: Kaitlyn Morgan MRN: [id number] Date of Birth: 03-09-1966

## 2016-02-25 NOTE — Progress Notes (Signed)
°  Radiation Oncology         [phone]) [phone] ________________________________  Name: Kaitlyn Morgan MRN: [id number]  Date: 02/08/2016  DOB: 05/17/1966  End of Treatment Note  Diagnosis:   Clinical Stage IIIB (T3, N1, M0) adenocarcinoma of the rectum     Indication for treatment:  Curative       Radiation treatment dates:  12/30/2015 to 02/08/2016  Site/dose:    1. The rectum was treated to 45 Gy in 25 fractions at 1.8 Gy per fraction. 2. The rectum was boosted to 5.4 Gy in 3 fractions at 1.8 Gy per fraction.   Beams/energy:    1. Conformal // 15X, 6X 2. Isodose Plan // 15X, 6X  Narrative: The patient tolerated radiation treatment relatively well.   The patient experienced abdominal cramping and dysuria. She reported constipation which then turned to diarrhea.   Plan: The patient has completed radiation treatment. Advised the patient to use Imodium AD and/or Gas-X to alleviate diarrhea and abdominal cramping. The patient will return to radiation oncology clinic for routine followup in one month. I advised them to call or return sooner if they have any questions or concerns related to their recovery or treatment.  ------------------------------------------------  Jodelle Gross, MD, PhD  This document serves as a record of services personally performed by Kyung Rudd, MD. It was created on his behalf by Arlyce Harman, a trained medical scribe. The creation of this record is based on the scribe's personal observations and the provider's statements to them. This document has been checked and approved by the attending provider.

## 2016-02-29 ENCOUNTER — Telehealth: Payer: Self-pay

## 2016-02-29 NOTE — Telephone Encounter (Signed)
Pt had to go home from work on 7/12 and stayed home for 1 more day. She had chills fever weakness and headache. She has FMLA but needed to call in each day. She is now requiring a note from the MD to be able to collect FMLA for missed 7/12 and 7/13. She has intermittant FMLA. Please fax the note to [phone] attn Jammie.

## 2016-03-01 ENCOUNTER — Ambulatory Visit: Payer: 59 | Admitting: Physical Therapy

## 2016-03-01 ENCOUNTER — Encounter: Payer: Self-pay | Admitting: Physical Therapy

## 2016-03-01 DIAGNOSIS — M62838 Other muscle spasm: Secondary | ICD-10-CM

## 2016-03-01 DIAGNOSIS — M6281 Muscle weakness (generalized): Secondary | ICD-10-CM | POA: Diagnosis not present

## 2016-03-01 NOTE — Therapy (Signed)
[facility] [address], [place], [place], [address] Phone: [phone]   Fax:  [phone]  Physical Therapy Treatment  Patient Details  Name: Kaitlyn Morgan MRN: [id number] Date of Birth: Jul 04, 1966 Referring Provider: Dr. Truitt Merle  Encounter Date: 03/01/2016      PT End of Session - 03/01/16 1619    Visit Number 2   Date for PT Re-Evaluation 04/21/16   PT Start Time 1618   PT Stop Time 1657   PT Time Calculation (min) 39 min   Activity Tolerance Patient tolerated treatment well   Behavior During Therapy [facility] for tasks assessed/performed      Past Medical History  Diagnosis Date  . Family history of breast cancer   . Family history of uterine cancer   . Colon cancer [facility])     rectal cancer    Past Surgical History  Procedure Laterality Date  . Cesarean section    . Abdominal hysterectomy    . Tubal ligation      There were no vitals filed for this visit.      Subjective Assessment - 03/01/16 1543    Patient Stated Goals understand how to strengthen the pelvic floor   Currently in Pain? Yes   Pain Score 3    Pain Location Rectum   Pain Orientation Mid   Pain Descriptors / Indicators Sore   Pain Type Acute pain   Pain Onset More than a month ago   Pain Frequency Intermittent   Aggravating Factors  sititng   Pain Relieving Factors not sitting                         [facility] Adult PT Treatment/Exercise - 03/01/16 0001    Therapeutic Activites    Therapeutic Activities Other Therapeutic Activities  toileting technique to promote bowel movement   Manual Therapy   Manual Therapy Soft tissue mobilization;Myofascial release   Soft tissue mobilization abdominal massage to promote intestinal movement for bowel movement in a circular pattern   Myofascial Release fascia between the bladder and rectum going through planes of movement                PT Education - 03/01/16  1617    Education provided Yes   Education Details abdominal massage, flexibility exericses   Person(s) Educated Patient   Methods Explanation;Demonstration;Verbal cues;Handout   Comprehension Returned demonstration;Verbalized understanding          PT Short Term Goals - 03/01/16 1618    PT SHORT TERM GOAL #1   Title understand how to manage vaginal dryness   Time 4   Period Weeks   Status New   PT SHORT TERM GOAL #2   Title when have the urge for a bowel movement able to wait 5 min   Time 4   Period Weeks   Status On-going   PT SHORT TERM GOAL #3   Title independent with flexibility exercises   Time 4   Period Weeks   Status On-going   PT SHORT TERM GOAL #4   Title understand correct toileting technique   Time 4   Period Weeks   Status On-going           PT Long Term Goals - 02/25/16 1711    PT LONG TERM GOAL #1   Title independent with HEP   Time 8   Period Weeks   Status New   PT LONG TERM GOAL #  2   Title pelvic floor strength is >/= 4/5   Time 8   Period Weeks   Status New   PT LONG TERM GOAL #3   Title ability to hold bowel movement to get to the bathroom in time without urgency   Time 8   Period Weeks   PT LONG TERM GOAL #4   Title pain with sitting decreased >/= 50% due to improved tissue flexibility   Time 8   Period Weeks   Status New   PT LONG TERM GOAL #5   Title understand how to use a dilator to maintain the vaginal opening after radiation   Time 8   Period Weeks   Status New               Plan - 03/01/16 1611    Clinical Impression Statement Patient has had 1 visit so she has not met any goals at this time.  Patient has learned stretches to stretch her legs and pelvic floor. Patient has learned abdominal massage to promote intestinal movement for bowels.  Patient only eats 1 time per day. Patient will benefit form physical therapy to  promote pelvic floor strength.    Rehab Potential Good   Clinical Impairments Affecting Rehab  Potential will be having surgery for the rectal tumor in end of August   PT Frequency 1x / week   PT Duration 8 weeks   PT Treatment/Interventions Biofeedback;Therapeutic activities;Therapeutic exercise;Patient/family education;Neuromuscular re-education;Manual techniques   PT Next Visit Plan pelvic floor EMG, information on LE lymphedema, pelvic floor meditation   PT Home Exercise Plan progress as needed   Consulted and Agree with Plan of Care Patient      Patient will benefit from skilled therapeutic intervention in order to improve the following deficits and impairments:  Pain, Decreased coordination, Decreased endurance, Decreased strength  Visit Diagnosis: Muscle weakness (generalized)  Other muscle spasm     Problem List Patient Active Problem List   Diagnosis Date Noted  . Genetic testing 02/19/2016  . Family history of breast cancer   . Family history of uterine cancer   . Rectal cancer ([place]) 12/18/2015    Earlie Counts, PT 03/01/2016 4:19 PM   [facility] [address], [place], [place], [address] Phone: [phone]   Fax:  [phone]  Name: Kaitlyn Morgan MRN: [id number] Date of Birth: June 02, 1966

## 2016-03-01 NOTE — Patient Instructions (Signed)
Supine Knee-to-Chest, Unilateral    Lie on back, hands clasped behind one knee. Pull knee in toward chest until a comfortable stretch is felt in lower back and buttocks. Hold _30__ seconds.  Repeat __2_ times per session. Do __1_ sessions per day.  Copyright  VHI. All rights reserved.  Supine Knee-to-Chest, Bilateral    Lie on back, hands clasped behind both knees. Pull knees in toward chest until a comfortable stretch is felt in lower back and buttocks. Hold _30__ seconds. Repeat _2__ times per session. Do _1__ sessions per day.  Copyright  VHI. All rights reserved.  Extensors / Rotators, Supine    Lie supine, one leg straight, other leg bent, knee held by opposite hand. Pull leg across body toward floor. Hold _30__ seconds.  Repeat __2_ times per session. Do _1__ sessions per day.  Copyright  VHI. All rights reserved.  Piriformis Stretch, Supine    Lie supine, folded towel under sacrum, one ankle crossed onto opposite knee. Holding bottom leg behind knee, gently pull legs toward chest and roll toward top-leg side. Feel stretch in hip or pelvic region. Hold _30__ seconds.  Repeat _2__ times per session. Do _1__ sessions per day.  Copyright  VHI. All rights reserved.    Position yourself as shown grabbing onto feet or behind the knees. You should feel a gentle stretch. Breathe in and allow the pelvic floor muscles to relax. Hold 30 sec, 2 times 1 time per day.   Cat / Cow Flow    Inhale, press spine toward ceiling like a Halloween cat. Keeping strength in arms and abdominals, exhale to soften spine through neutral and into cow pose. Open chest and arch back. Initiate movement between cat and cow at tailbone, one vertebrae at a time. Repeat __10__ times. 1 time per day  Copyright  VHI. All rights reserved.   Quads / HF, Side-Lying    Lie on one side, legs bent. Hold foot of top leg with same-side hand. Raise leg. Hold _30__ seconds.  Repeat _2__ times per  session. Do _1__ sessions per day.  Copyright  VHI. All rights reserved.  Butterfly, Supine    Lie on back, feet together. Lower knees toward floor. Hold _30__ seconds. Repeat _2__ times per session. Do __1_ sessions per day.  Copyright  VHI. All rights reserved.  Chair Sitting    Sit at edge of seat, spine straight, one leg extended. Put a hand on each thigh and bend forward from the hip, keeping spine straight. Allow hand on extended leg to reach toward toes. Support upper body with other arm. Hold ___ seconds. Repeat ___ times per session. Do ___ sessions per day.  Copyright  VHI. All rights reserved.  Piriformis Stretch, Sitting    Sit, one ankle on opposite knee, same-side hand on crossed knee. Push down on knee, keeping spine straight. Lean torso forward, with flat back, until tension is felt in hamstrings and gluteals of crossed-leg side. Hold _30__ seconds.  Repeat _2__ times per session. Do __1_ sessions per day.  Copyright  VHI. All rights reserved.   About Abdominal Massage  Abdominal massage, also called external colon massage, is a self-treatment circular massage technique that can reduce and eliminate gas and ease constipation. The colon naturally contracts in waves in a clockwise direction starting from inside the right hip, moving up toward the ribs, across the belly, and down inside the left hip.  When you perform circular abdominal massage, you help stimulate your colon's normal wave  pattern of movement called peristalsis.  It is most beneficial when done after eating.  Positioning You can practice abdominal massage with oil while lying down, or in the shower with soap.  Some people find that it is just as effective to do the massage through clothing while sitting or standing.  How to Massage Start by placing your finger tips or knuckles on your right side, just inside your hip bone.  . Make small circular movements while you move upward toward your rib  cage.   . Once you reach the bottom right side of your rib cage, take your circular movements across to the left side of the bottom of your rib cage.  . Next, move downward until you reach the inside of your left hip bone.  This is the path your feces travel in your colon. . Continue to perform your abdominal massage in this pattern for 10 minutes each day.     You can apply as much pressure as is comfortable in your massage.  Start gently and build pressure as you continue to practice.  Notice any areas of pain as you massage; areas of slight pain may be relieved as you massage, but if you have areas of significant or intense pain, consult with your healthcare provider.  Other Considerations . General physical activity including bending and stretching can have a beneficial massage-like effect on the colon.  Deep breathing can also stimulate the colon because breathing deeply activates the same nervous system that supplies the colon.   . Abdominal massage should always be used in combination with a bowel-conscious diet that is high in the proper type of fiber for you, fluids (primarily water), and a regular exercise program. .  [facility] [address], [place], [place] [address] Phone # [phone] Fax [phone]

## 2016-03-02 ENCOUNTER — Other Ambulatory Visit: Payer: Self-pay | Admitting: General Surgery

## 2016-03-02 NOTE — H&P (Signed)
History of Present Illness Leighton Ruff MD; [id number] 2:46 PM) The patient is a 50 year old female who presents with colorectal cancer. 50 year old female who presents to the office for evaluation of rectal cancer. She is status post chemotherapy and radiation which was completed at the end of June. She was diagnosed with stage III disease via MRI of the pelvis. Her CEA was normal. Her CT abdomen shows some small liver lesions. She is currently having some obstructive symptoms but is having bowel function without much difficulty. She denies any rectal bleeding currently. She underwent a flexible sigmoidoscopy to diagnose this and they were unable to traverse the tumor with the endoscope. It was tattooed distally and was thought to be approximately 15 cm from anal verge. MRI showed the tumor to be approximately 8 cm from the anal verge.   Problem List/Past Medical Leighton Ruff, MD; [id number] 2:46 PM) RECTAL CANCER (C20)  Other Problems Leighton Ruff, MD; [id number] 2:46 PM) Bladder Problems Cancer Rectal Cancer Back Pain  Past Surgical History Leighton Ruff, MD; [id number] 2:46 PM) Hysterectomy (due to cancer) - Partial Cesarean Section - 1  Diagnostic Studies History Leighton Ruff, MD; [id number] 2:46 PM) Colonoscopy within last year Mammogram within last year  Allergies Elbert Ewings, CMA; 03/02/2016 1:56 PM) Ambien *HYPNOTICS/SEDATIVES/SLEEP DISORDER AGENTS* Codeine Sulfate *ANALGESICS - OPIOID* Nausea.  Medication History Leighton Ruff, MD; [id number] 2:46 PM) Medications Reconciled Neomycin Sulfate (500MG  Tablet, 2 (two) Tablet Oral SEE NOTE, Taken starting 03/02/2016) Active. (TAKE TWO TABLETS AT 2 PM, 3 PM, AND 10 PM THE DAY PRIOR TO SURGERY) No Current Medications (Taken starting 03/02/2016) Flagyl (500MG  Tablet, 2 (two) Tablet Oral SEE NOTE, Taken starting 03/02/2016) Active. (Take at 2pm, 3pm, and 10pm the day prior to your colon operation)  Social  History Leighton Ruff, MD; [id number] 2:46 PM) Caffeine use Coffee. No drug use Tobacco use Never smoker. Alcohol use Occasional alcohol use.  Family History Leighton Ruff, MD; [id number] 2:46 PM) Alcohol Abuse Father. Arthritis Sister. Hypertension Father, Sister. Respiratory Condition Father.  Pregnancy / Birth History Leighton Ruff, MD; [id number] 2:46 PM) Age at menarche 47 years. Contraceptive History Intrauterine device. Gravida 4 Irregular periods Maternal age 93-20 Para 3    Vitals Elbert Ewings CMA; 03/02/2016 1:57 PM) 03/02/2016 1:56 PM Weight: 167 lb Height: 62in Body Surface Area: 1.77 m Body Mass Index: 30.54 kg/m  Temp.: 98.21F(Temporal)  Pulse: 89 (Regular)  BP: 130/72 (Sitting, Left Arm, Standard)      Physical Exam Leighton Ruff MD; [id number] 2:44 PM)  The physical exam findings are as follows: Note:General: Overweight female in NAD. Pleasant and cooperative.  HEENT: [place]/AT, no facial masses  EYES: EOMI, no icterus  NECK: Supple, no obvious mass.  CV: RRR, no murmur, no JVD.  CHEST: Breath sounds equal and clear. Respirations nonlabored.  ABDOMEN: Soft, nontender, nondistended, no masses, no organomegaly, no hernias.  ANORECTAL: No fissures. Normal sphincter tone. No masses. No blood on finger.  MUSCULOSKELETAL: FROM, good muscle tone, no edema, no venous stasis changes  LYMPHATIC: No palpable supraclavicular or inguinal adenopathy.  SKIN: No jaundice or suspicious rashes.  NEUROLOGIC: Alert and oriented, answers questions appropriately, normal gait and station.  PSYCHIATRIC: Normal mood, affect , and behavior.    Assessment & Plan Leighton Ruff MD; [id number] 2:39 PM)  RECTAL CANCER (C20) Impression: 50 year old female status post chemotherapy and radiation treatment for a distal sigmoid proximal rectal cancer with almost complete obstruction. She has done well with radiation and is ready  for definitive resection. CT  scans show some small lesions in her liver that are too small to characterize. CEA was normal. Rectal ultrasound showed evidence of enlarged lymph nodes. The distal portion of the tumor was tattooed at approximately 15 cm. We had a long discussion today about rectal cancer and robotic versus open resections. We discussed that there is no obvious difference in outcomes of surgery except for one study. We discussed that our information on this is limited due to the short time period that we have been performing robotic rectal surgery. The surgery and anatomy were described to the patient as well as the risks of surgery and the possible complications. These include: Bleeding, deep abdominal infections and possible wound complications such as hernia and infection, damage to adjacent structures, leak of surgical connections, which can lead to other surgeries and possibly an ostomy, possible need for other procedures, such as abscess drains in radiology, possible prolonged hospital stay, possible diarrhea from removal of part of the colon, possible constipation from narcotics, possible bowel, bladder or sexual dysfunction if having rectal surgery, prolonged fatigue/weakness or appetite loss, possible early recurrence of of disease, possible complications of their medical problems such as heart disease or arrhythmias or lung problems, death (less than 1%). I believe the patient understands and wishes to proceed with the surgery.

## 2016-03-05 NOTE — Progress Notes (Signed)
  Radiation Oncology         [phone]) [phone] ________________________________  Name: Kaitlyn Morgan MRN: [id number]  Date: 01/21/2016  DOB: 01/23/1966  COMPLEX SIMULATION  NOTE  Diagnosis: rectal cancer  Narrative The patient has initially been planned to receive a course of radiation treatment to a dose of 45 Gy in 25 fractions at 1.8 gray per fraction. The patient will now receive a boost to the high risk target volume for an additional 5.4 Gy. This will be delivered in 3 fractions at 1.8 Gy per fraction and a cone down boost technique will be utilized. To accomplish this, an additional 4 customized blocks have been designed for this purpose. A complex isodose plan is requested to ensure that the high-risk target region receives the appropriate radiation dose and that the nearby normal structures continue to be appropriately spared. The patient's final total dose therefore will be 50.4 Gy.   ________________________________ ------------------------------------------------  Jodelle Gross, MD, PhD

## 2016-03-07 ENCOUNTER — Ambulatory Visit: Payer: 59 | Admitting: Physical Therapy

## 2016-03-07 ENCOUNTER — Encounter: Payer: Self-pay | Admitting: Hematology

## 2016-03-07 ENCOUNTER — Ambulatory Visit (HOSPITAL_BASED_OUTPATIENT_CLINIC_OR_DEPARTMENT_OTHER): Payer: 59 | Admitting: Hematology

## 2016-03-07 ENCOUNTER — Other Ambulatory Visit (HOSPITAL_BASED_OUTPATIENT_CLINIC_OR_DEPARTMENT_OTHER): Payer: 59

## 2016-03-07 VITALS — BP 102/68 | HR 81 | Temp 97.9°F | Resp 18 | Ht 62.0 in | Wt 166.8 lb

## 2016-03-07 DIAGNOSIS — K5900 Constipation, unspecified: Secondary | ICD-10-CM

## 2016-03-07 DIAGNOSIS — C20 Malignant neoplasm of rectum: Secondary | ICD-10-CM | POA: Diagnosis not present

## 2016-03-07 DIAGNOSIS — R1084 Generalized abdominal pain: Secondary | ICD-10-CM

## 2016-03-07 DIAGNOSIS — M6281 Muscle weakness (generalized): Secondary | ICD-10-CM | POA: Diagnosis not present

## 2016-03-07 DIAGNOSIS — M62838 Other muscle spasm: Secondary | ICD-10-CM

## 2016-03-07 LAB — CBC WITH DIFFERENTIAL/PLATELET
BASO%: 1.1 % (ref 0.0–2.0)
Basophils Absolute: 0 10*3/uL (ref 0.0–0.1)
EOS%: 5.3 % (ref 0.0–7.0)
Eosinophils Absolute: 0.1 10*3/uL (ref 0.0–0.5)
HCT: 35.2 % (ref 34.8–46.6)
HGB: 12.1 g/dL (ref 11.6–15.9)
LYMPH%: 18 % (ref 14.0–49.7)
MCH: 31.8 pg (ref 25.1–34.0)
MCHC: 34.5 g/dL (ref 31.5–36.0)
MCV: 92.3 fL (ref 79.5–101.0)
MONO#: 0.2 10*3/uL (ref 0.1–0.9)
MONO%: 10.1 % (ref 0.0–14.0)
NEUT#: 1.5 10*3/uL (ref 1.5–6.5)
NEUT%: 65.5 % (ref 38.4–76.8)
Platelets: 232 10*3/uL (ref 145–400)
RBC: 3.81 10*6/uL (ref 3.70–5.45)
RDW: 17.7 % — ABNORMAL HIGH (ref 11.2–14.5)
WBC: 2.4 10*3/uL — ABNORMAL LOW (ref 3.9–10.3)
lymph#: 0.4 10*3/uL — ABNORMAL LOW (ref 0.9–3.3)

## 2016-03-07 LAB — COMPREHENSIVE METABOLIC PANEL
ALT: 19 U/L (ref 0–55)
AST: 24 U/L (ref 5–34)
Albumin: 3.8 g/dL (ref 3.5–5.0)
Alkaline Phosphatase: 92 U/L (ref 40–150)
Anion Gap: 9 mEq/L (ref 3–11)
BUN: 6 mg/dL — ABNORMAL LOW (ref 7.0–26.0)
CO2: 27 mEq/L (ref 22–29)
Calcium: 8.9 mg/dL (ref 8.4–10.4)
Chloride: 106 mEq/L (ref 98–109)
Creatinine: 0.7 mg/dL (ref 0.6–1.1)
EGFR: >90 mL/min/{1.73_m2} (ref >90)
Glucose: 79 mg/dl (ref 70–140)
Potassium: 3.6 mEq/L (ref 3.5–5.1)
Sodium: 142 mEq/L (ref 136–145)
Total Bilirubin: 2.03 mg/dL — ABNORMAL HIGH (ref 0.20–1.20)
Total Protein: 7.2 g/dL (ref 6.4–8.3)

## 2016-03-07 NOTE — Progress Notes (Signed)
Kaitlyn Morgan  [phone] [phone]  Clinic follow Up Note   Patient Care Team: Leandrew Koyanagi, MD as PCP - General (Internal Medicine) Truitt Merle, MD as Consulting Physician (Hematology) Kyung Rudd, MD as Consulting Physician (Radiation Oncology) Jackolyn Confer, MD as Consulting Physician (General Surgery) Richmond Campbell, MD as Consulting Physician (Gastroenterology) Milus Banister, MD as Attending Physician (Gastroenterology) 03/07/2016   CHIEF COMPLAINTS:  Follow up rectal cancer  Oncology History   Rectal cancer [facility])   Staging form: Colon and Rectum, AJCC 7th Edition     Clinical stage from 12/03/2015: Stage IIIB (T3, N1, M0) - Signed by Truitt Merle, MD on 12/18/2015       Rectal cancer ([facility])   12/03/2015 Initial Diagnosis    Rectal cancer ([place])     12/03/2015 Procedure    Colonoscopy showed a partially obstructing tumor in the proximal rectum, its diameter measured 6 mm, biopsed     12/03/2015 Initial Biopsy    Rectum mass biopsy showed adenocarcinoma, moderately differentiated     12/10/2015 Imaging    CT abdomen and pelvis with contrast showed luminal narrowing and mural thickening in the proximal to mid rectum. There is a 6 mm short axis mesorectal lymph node suspicious for metastasis. There are several tiny indeterminate liver lesions      12/17/2015 Imaging    MRI pelvis with and without contrast showed a advanced T3 rectal adenocarcinoma (7cm), N1 disease (at least 2 left-sided nasal rectal lymph nodes are seen measuring 6-7 mm), distance from tumor to the sphincter is 8 cm     12/17/2015 Imaging    CT chest with contrast showed no evidence of metastasis or other changes     12/30/2015 - 02/08/2016 Radiation Therapy    neoadjuvant radiation to rectal cancer      12/30/2015 - 02/08/2016 Chemotherapy    xeloda 1555m twice daily with concurent radiation, she tolerated well       HISTORY OF PRESENTING ILLNESS:  Kaitlyn Morgan y.o. female is here because of Her newly diagnosed rectal cancer. She is accompanied by her daughter and her mother to our multidisciplinary GI clinic today.  She has had chronic abdominal pain for 2-3 years, in the epigastric area and low abdomen, which has been worse lately, and she noticed constipation, abdominal bloating and mild rectal bleeding for 2 months. She was referred to Dr. MEarlean Shawland underwent colonoscopy on 12/03/2015. A partial obstructive rectal mass was found in the proximal rectum, biopsy showed adenocarcinoma. She was referred to surgeon Dr. RZella Richerwho referred her to [facility] consider new adjuvant chemoradiation.  She feels well overall, has mild fatigue, but able to tolerate her routine full-time job in daily activities without difficulties. She has good appetite, but eats less due to the epigastric pain after meal. She denies any other significant pain, cough, or other symptoms. No recent weight loss. She has been on phentermine for weight loss recently, and stopped a few days ago.  CURRENT THERAPY: pending surgery  INTERIM HISTORY:  Mrs ALivecchireturns for follow up. She has recovered well from her radiation and chemotherapy. She denies any significant rectal discomfort. She has been having intermittent abdominal cramps in the past few days, and constipated, her last normal bowel movement was about 4-5 days ago. She tried MiraLAX once yesterday, no nausea, vomiting, fever, or other new symptoms. She works full-time, appetite and energy level are decent. She has been eating liquid in the past few days  due to the abdominal cramps. No other new complaints. She was seen by Dr. Marcello Moores and is scheduled to have her rectal cancer surgery on August 7.  MEDICAL HISTORY:  Past Medical History:  Diagnosis Date  . Colon cancer ([place])    rectal cancer  . Family history of breast cancer   . Family history of uterine cancer     SURGICAL HISTORY: Past Surgical History:  Procedure  Laterality Date  . ABDOMINAL HYSTERECTOMY    . CESAREAN SECTION    . TUBAL LIGATION      SOCIAL HISTORY: Social History   Social History  . Marital status: Legally Separated    Spouse name: N/A  . Number of children: 3  . Years of education: N/A   Occupational History  . Not on file.   Social History Main Topics  . Smoking status: Never Smoker  . Smokeless tobacco: Never Used  . Alcohol use No  . Drug use: No  . Sexual activity: Not on file   Other Topics Concern  . Not on file   Social History Narrative  . No narrative on file   She has 3 children, she lives with her twin sons who are 33. She works for [employer]   FAMILY HISTORY: Family History  Problem Relation Age of Onset  . Breast cancer Maternal Grandmother     dx in her 4s  . Cancer Paternal Grandmother 96    uterine cancer   . Hypertension Father   . COPD Father   . Hypertension Sister   . Hypertension Paternal Grandfather   . Heart attack Paternal Grandfather   . Hypertension Sister   . Skin cancer Cousin     maternal first cousin    ALLERGIES:  is allergic to codeine; morphine and related; and zolpidem tartrate.  MEDICATIONS:  Current Outpatient Prescriptions  Medication Sig Dispense Refill  . Ginger, Zingiber officinalis, (GINGER ROOT PO) Take 1 capsule by mouth as needed.     . Polyethylene Glycol 3350 (MIRALAX PO) Take 17 g by mouth daily as needed. Reported on 02/08/2016    . valACYclovir (VALTREX) 500 MG tablet take 1 tablet by mouth as needed when has outbreak   for BID x 3 days.  4  . estradiol (ESTRACE) 2 MG tablet TK 1 T PO QD  4  . ondansetron (ZOFRAN) 8 MG tablet Take 1 tablet (8 mg total) by mouth every 8 (eight) hours as needed for nausea. (Patient not taking: Reported on 03/07/2016) 30 tablet 3  . phentermine (ADIPEX-P) 37.5 MG tablet take 1 tablet by mouth daily  2  . prochlorperazine (COMPAZINE) 10 MG tablet Take 1 tablet (10 mg total) by mouth every 8 (eight) hours as  needed for nausea. (Patient not taking: Reported on 03/07/2016) 20 tablet 2  . senna-docusate (SENOKOT S) 8.6-50 MG tablet Take 2 tablets by mouth at bedtime. Reported on 02/08/2016     No current facility-administered medications for this visit.     REVIEW OF SYSTEMS:   Constitutional: Denies fevers, chills or abnormal night sweats Eyes: Denies blurriness of vision, double vision or watery eyes Ears, nose, mouth, throat, and face: Denies mucositis or sore throat Respiratory: Denies cough, dyspnea or wheezes Cardiovascular: Denies palpitation, chest discomfort or lower extremity swelling Gastrointestinal:  Denies nausea, heartburn or change in bowel habits Skin: Denies abnormal skin rashes Lymphatics: Denies new lymphadenopathy or easy bruising Neurological:Denies numbness, tingling or new weaknesses Behavioral/Psych: Mood is stable, no new changes  All other systems were reviewed with the patient and are negative.  PHYSICAL EXAMINATION: ECOG PERFORMANCE STATUS: 1 - Symptomatic but completely ambulatory  Vitals:   03/07/16 1534  BP: 102/68  Pulse: 81  Resp: 18  Temp: 97.9 F (36.6 C)   Filed Weights   03/07/16 1534  Weight: 166 lb 12.8 oz (75.7 kg)    GENERAL:alert, no distress and comfortable SKIN: skin color, texture, turgor are normal, no rashes or significant lesions EYES: normal, conjunctiva are pink and non-injected, sclera clear OROPHARYNX:no exudate, no erythema and lips, buccal mucosa, and tongue normal  NECK: supple, thyroid normal size, non-tender, without nodularity LYMPH:  no palpable lymphadenopathy in the cervical, axillary or inguinal LUNGS: clear to auscultation and percussion with normal breathing effort HEART: regular rate & rhythm and no murmurs and no lower extremity edema ABDOMEN:abdomen soft, non-tender and normal bowel sounds.  Musculoskeletal:no cyanosis of digits and no clubbing  PSYCH: alert & oriented x 3 with fluent speech NEURO: no focal  motor/sensory deficits  LABORATORY DATA:  I have reviewed the data as listed CBC Latest Ref Rng & Units 03/07/2016 02/22/2016 02/01/2016  WBC 3.9 - 10.3 10e3/uL 2.4(L) 3.0(L) 2.7(L)  Hemoglobin 11.6 - 15.9 g/dL 12.1 12.8 11.8  Hematocrit 34.8 - 46.6 % 35.2 38.3 34.4(L)  Platelets 145 - 400 10e3/uL 232 188 150    CMP Latest Ref Rng & Units 03/07/2016 02/22/2016 02/01/2016  Glucose 70 - 140 mg/dl 79 86 86  BUN 7.0 - 26.0 mg/dL 6.0(L) 9.5 7.5  Creatinine 0.6 - 1.1 mg/dL 0.7 0.8 0.8  Sodium 136 - 145 mEq/L 142 144 142  Potassium 3.5 - 5.1 mEq/L 3.6 3.6 3.7  CO2 22 - 29 mEq/L _0 Calcium 8.4 - 10.4 mg/dL 8.9 9.2 9.0  Total Protein 6.4 - 8.3 g/dL 7.2 7.2 7.1  Total Bilirubin 0.20 - 1.20 mg/dL 2.03(H) 1.59(H) 2.07(H)  Alkaline Phos 40 - 150 U/L 92 92 64  AST 5 - 34 U/L _1 ALT 0 - 55 U/L _2 PATHOLOGY REPORT Diagnosis 12/03/2015 Colon, sigmoid, mass, biopsy -Invasive adenocarcinoma, moderately differentiated. See comment.  Comment: due to the presence of adenocarcinoma, IHC for DNA mismatch repair proteins will be performed and reported in an addendum.   RADIOGRAPHIC STUDIES: I have personally reviewed the radiological images as listed and agreed with the findings in the report. No results found. COLONOSCOPY DR. MEDOFF 12/03/2015  Impression Malignant partially obstructing tumor in the proximal rectum, its diameter measured 6 mm, biopsy. Distal margin at 15 cm.    ASSESSMENT & PLAN:  50 year old Caucasian female, without significant past medical history, presented with abdominal pain and rectal bleeding. Colonoscopy showed a obstructing tumor in the proximal rectum.  1. Rectal adenocarcinoma, proximal rectum, cT3N1M0, stage IIIB, moderately differentiated --I have reviewed her colonoscopy, EUS, MRI of pelvis, CT of abdomen pelvis finding with pt and her family in details -The pelvic MRI showed a T3 lesion, 2 suspicious lymph nodes, likely state IIIB disease. She  also was found to have multiple small indeterminate hypodense lesion in the liver, which was not found on her prior CT scan in 2007. We will order a abdominal MRI to further look into the liver lesions.  -CT chest was negative for distant metastasis. Abdominal MRI showed multiple cysts in the liver, spleen and right kidney. No concerning for metastasis. -We reviewed the natural history of rectal cancer, risk of recurrence after surgery, depending on the stage.  We discussed the treatment option for stage IIIB rectal cancer. The standard of care is neoadjuvant chemotherapy and radiation, followed by surgery, then adjuvant chemotherapy. -she has completed concurrent chemoradiation with  Xeloda, tolerated well -I reviewed her lab results, she still has persistent elevated total bilirubin, which could be related to her chemotherapy.  To ruled out metastatic disease in the liver, I'll obtain a CT abdomen and pelvis with contrast before her surgery. -She is scheduled to have her rectal cancer surgery by Dr. Marcello Moores on 03/21/2016  2. Abdominal cramps and constipation -Her abdominal cramps are likely related to constipation, I encouraged her to use MiraLAX more often, drinks water adequately. -She knows to call us if her symptoms does not improve.  3. Elevated bilirubin -Possibly related to Xeloda, needle ruled out metastatic disease in the liver, we'll obtain CT scan in the next few weeks    Plan -CT abdomen and pelvis with contrast in the next 1-2 weeks, she will call me after her scan (I left a message for her about this after her visit with me) -She is scheduled to have robotic assisted rectal surgery on 03/21/2016 -I will see her back the week of 8/28  All questions were answered. The patient knows to call the clinic with any problems, questions or concerns.  I spent 20 minutes counseling the patient face to face. The total time spent in the appointment was 25 minutes and more than 50% was on  counseling.     Truitt Merle, MD 03/07/2016 4:25 PM

## 2016-03-07 NOTE — Patient Instructions (Addendum)
Relaxation Exercises with the Urge to Void   When you experience an urge to void:  FIRST  Stop and stand very still    Sit down if you can    Don't move    You need to stay very still to maintain control  SECOND Squeeze your pelvic floor muscles 5 times, like a quick flick, to keep from leaking  THIRD Relax  Take a deep breath and then let it out  Try to make the urge go away by using relaxation and visualization techniques  FINALLY When you feel the urge go away somewhat, walk normally to the bathroom.   If the urge gets suddenly stronger on the way, you may stop again and relax to regain control. Quick Contraction: Gravity Resisted (Sitting)    Sitting, quickly squeeze then fully relax pelvic floor. Perform _1__ sets of _5__. Rest for _1__ seconds between sets. Do _4__ times a day.  Copyright  VHI. All rights reserved.  Slow Contraction: Gravity Resisted (Sitting)    Sitting, slowly squeeze pelvic floor for _5__ seconds. Rest for _5__ seconds. Repeat _10__ times. Do _4__ times a day.  Copyright  VHI. All rights reserved.  [facility] [address], [place], [place] [address] Phone # [phone] Fax [phone]

## 2016-03-07 NOTE — Therapy (Signed)
[facility] [address] [place], [place], [address] Phone: [phone]   Fax:  [phone]  Physical Therapy Treatment  Patient Details  Name: Kaitlyn Morgan MRN: [id number] Date of Birth: 08-23-1965 Referring Provider: Dr. Truitt Merle  Encounter Date: 03/07/2016      PT End of Session - 03/07/16 1343    Visit Number 3   Date for PT Re-Evaluation 04/21/16   PT Start Time 1240  pt. came 10 min late   PT Stop Time 1315   PT Time Calculation (min) 35 min   Activity Tolerance Patient tolerated treatment well   Behavior During Therapy [facility] for tasks assessed/performed      Past Medical History:  Diagnosis Date  . Colon cancer ([place])    rectal cancer  . Family history of breast cancer   . Family history of uterine cancer     Past Surgical History:  Procedure Laterality Date  . ABDOMINAL HYSTERECTOMY    . CESAREAN SECTION    . TUBAL LIGATION      There were no vitals filed for this visit.      Subjective Assessment - 03/07/16 1243    Subjective I see Dr. Burr Medico today.  I am concerned about having a blockage and increased cramps. I am on a liquid diet. I have less fatique in my legs due to doing the stretches. Patient has to sit on a pillow. I took Miralax this weekend but nothing happened.  I have alot of gurgling in intestines.    Patient Stated Goals understand how to strengthen the pelvic floor   Currently in Pain? No/denies   Multiple Pain Sites No                         [facility] Adult PT Treatment/Exercise - 03/07/16 0001      Manual Therapy   Manual Therapy Myofascial release;Soft tissue mobilization   Soft tissue mobilization abdominal massage to promote intestinal movement for bowel movement in a circular pattern   Myofascial Release to [place] of [place] to improve bowel movement                 PT Education - 03/07/16 1341    Education provided Yes   Education Details  pelvic floor exercise, pelvic floor meditation, urge to have a bowel movement   Person(s) Educated Patient   Methods Explanation;Demonstration;Verbal cues;Handout   Comprehension Returned demonstration;Verbalized understanding          PT Short Term Goals - 03/07/16 1346      PT SHORT TERM GOAL #1   Title understand how to manage vaginal dryness   Time 4   Period Weeks   Status New     PT SHORT TERM GOAL #2   Title when have the urge for a bowel movement able to wait 5 min   Time 4   Period Weeks   Status On-going     PT SHORT TERM GOAL #3   Title independent with flexibility exercises   Time 4   Period Weeks   Status Achieved     PT SHORT TERM GOAL #4   Title understand correct toileting technique   Time 4   Period Weeks   Status Achieved           PT Long Term Goals - 02/25/16 1711      PT LONG TERM GOAL #1   Title independent with HEP   Time  8   Period Weeks   Status New     PT LONG TERM GOAL #2   Title pelvic floor strength is >/= 4/5   Time 8   Period Weeks   Status New     PT LONG TERM GOAL #3   Title ability to hold bowel movement to get to the bathroom in time without urgency   Time 8   Period Weeks     PT LONG TERM GOAL #4   Title pain with sitting decreased >/= 50% due to improved tissue flexibility   Time 8   Period Weeks   Status New     PT LONG TERM GOAL #5   Title understand how to use a dilator to maintain the vaginal opening after radiation   Time 8   Period Weeks   Status New               Plan - 03/07/16 1343    Clinical Impression Statement Patient has been doing the abdominal massage.  Pateint has the urge to have a bowel movment when she hears someone going into the bathrroom.  Patient reports conern about having a blockage.  After therapy Patient had good bowel sounds.  Patient will benefit from physical therapy to improve bowel movements.    Rehab Potential Good   Clinical Impairments Affecting Rehab Potential  will be having surgery for the rectal tumor in end of August   PT Frequency 1x / week   PT Duration 8 weeks   PT Treatment/Interventions Biofeedback;Therapeutic activities;Therapeutic exercise;Patient/family education;Neuromuscular re-education;Manual techniques   PT Next Visit Plan pelvic floor EMG, information on LE lymphedema, information on vaginal dryness   PT Home Exercise Plan progress as needed   Consulted and Agree with Plan of Care Patient      Patient will benefit from skilled therapeutic intervention in order to improve the following deficits and impairments:  Pain, Decreased coordination, Decreased endurance, Decreased strength  Visit Diagnosis: Muscle weakness (generalized)  Other muscle spasm     Problem List Patient Active Problem List   Diagnosis Date Noted  . Genetic testing 02/19/2016  . Family history of breast cancer   . Family history of uterine cancer   . Rectal cancer ([place]) 12/18/2015    Earlie Counts, PT 03/07/16 1:48 PM   [facility] [address] [place], [place], [address] Phone: [phone]   Fax:  [phone]  Name: Kaitlyn Morgan MRN: [id number] Date of Birth: May 17, 1966

## 2016-03-09 ENCOUNTER — Telehealth: Payer: Self-pay | Admitting: Nurse Practitioner

## 2016-03-09 ENCOUNTER — Telehealth: Payer: Self-pay | Admitting: Hematology

## 2016-03-09 ENCOUNTER — Ambulatory Visit
Admission: RE | Admit: 2016-03-09 | Discharge: 2016-03-09 | Disposition: A | Discharge status: Home / Self Care | Payer: 59 | Source: Ambulatory Visit | Attending: Radiation Oncology | Admitting: Radiation Oncology

## 2016-03-09 ENCOUNTER — Encounter: Payer: Self-pay | Admitting: Radiation Oncology

## 2016-03-09 DIAGNOSIS — Z888 Allergy status to other drugs, medicaments and biological substances status: Secondary | ICD-10-CM | POA: Insufficient documentation

## 2016-03-09 DIAGNOSIS — C20 Malignant neoplasm of rectum: Secondary | ICD-10-CM | POA: Diagnosis not present

## 2016-03-09 DIAGNOSIS — Z9221 Personal history of antineoplastic chemotherapy: Secondary | ICD-10-CM | POA: Insufficient documentation

## 2016-03-09 DIAGNOSIS — Z885 Allergy status to narcotic agent status: Secondary | ICD-10-CM | POA: Insufficient documentation

## 2016-03-09 NOTE — Telephone Encounter (Signed)
cld & spoke to pt and gave pt appt time & date-pt stated will come by to pick up contrast and copy of sch

## 2016-03-09 NOTE — Progress Notes (Signed)
Radiation Oncology         [phone]) [phone] ________________________________  Name: Kaitlyn Morgan MRN: [id number]  Date: 03/09/2016  DOB: 1966-01-06  Follow-Up Visit Note  CC: DOOLITTLE, Linton Ham, MD  Jackolyn Confer, MD  Diagnosis: Clinical Stage IIIB (T3, N1, M0) adenocarcinoma of the rectum  Interval Since Last Radiation: 1 month  12/30/2015 to 02/08/2016: 1. The rectum was treated to 45 Gy in 25 fractions at 1.8 Gy per fraction. 2. The rectum was boosted to 5.4 Gy in 3 fractions at 1.8 Gy per fraction.   Narrative:  The patient returns today for routine follow-up.  On review of systems, the patient reports that prior to last Saturday she had cramping pain in her upper and lower abdomen. This resulted in her intake changing to clear liquids for three days and is now eating a soft food diet. At this time, she has no pain. She is drinking more liquids. She admits to being lightheaded at times. She experienced watery stools on Monday and Tuesday, but noted tan mildly formed stool this morning. Denies any rectal irritation or pain. The patient had a 5 lb weight loss since last month. No other complaints are verbalized.   ALLERGIES:  is allergic to codeine; morphine and related; and zolpidem tartrate.  Meds: Current Outpatient Prescriptions  Medication Sig Dispense Refill  . Ginger, Zingiber officinalis, (GINGER ROOT PO) Take 1 capsule by mouth as needed.     . ondansetron (ZOFRAN) 8 MG tablet Take 1 tablet (8 mg total) by mouth every 8 (eight) hours as needed for nausea. 30 tablet 3  . prochlorperazine (COMPAZINE) 10 MG tablet Take 1 tablet (10 mg total) by mouth every 8 (eight) hours as needed for nausea. 20 tablet 2  . valACYclovir (VALTREX) 500 MG tablet take 1 tablet by mouth as needed when has outbreak   for BID x 3 days.  4  . estradiol (ESTRACE) 2 MG tablet TK 1 T PO QD  4  . phentermine (ADIPEX-P) 37.5 MG tablet take 1 tablet by mouth daily  2  . Polyethylene Glycol 3350  (MIRALAX PO) Take 17 g by mouth daily as needed. Reported on 02/08/2016    . senna-docusate (SENOKOT S) 8.6-50 MG tablet Take 2 tablets by mouth at bedtime. Reported on 02/08/2016     No current facility-administered medications for this encounter.     Physical Findings:  height is 5\' 2"  (1.575 m) and weight is 163 lb 12.8 oz (74.3 kg). Her temperature is 97.9 F (36.6 C). Her blood pressure is 100/73 and her pulse is 83.  In general this is a well appearing caucasian female in no acute distress. She's alert and oriented x4 and appropriate throughout the examination. Cardiopulmonary assessment is negative for acute distress and she exhibits normal effort.     Lab Findings: Lab Results  Component Value Date   WBC 2.4 (L) 03/07/2016   HGB 12.1 03/07/2016   HCT 35.2 03/07/2016   MCV 92.3 03/07/2016   PLT 232 03/07/2016     Radiographic Findings: No results found.  Impression/Plan: 1. Clinical Stage IIIB (T3, N1, M0) adenocarcinoma of the rectum. The patient appears to be doing very well overall. She will continue under the care of Dr. Marcello Moores and Dr. Burr Medico as well. She will be undergoing robotic LAR on 03/21/16. We will plan to see her back in 6 months to begin surveillance visits. She states agreement and understanding. 2. Gynecologic care. I would be happy to provide her GYN  care during her examinations annually. She will keep me informed of any questions or concerns regarding GYN needs.      Carola Rhine, PAC  This document serves as a record of services personally performed by Shona Simpson, PA-C. It was created on her behalf by Darcus Austin, a trained medical scribe. The creation of this record is based on the scribe's personal observations and the provider's statements to them. This document has been checked and approved by the attending provider.

## 2016-03-09 NOTE — Telephone Encounter (Signed)
please disregard prev message on chart regarding med rec

## 2016-03-09 NOTE — Progress Notes (Addendum)
She reports that prior to this past Saturday she had cramping pain in her abdomen, upper and lower regions. This resulted in her intake changing to clear liquids for three days and is now eating a soft diet. Presently no pain. Drinking more liquids. Admits to being lightheaded at times. Monday and Tuesday experienced watery stools, but noted tan, mildly formed stool this am. Denies andy rectal irritation.  Presently no pain.   BP 102/65 (BP Location: Right Arm, Patient Position: Sitting, Cuff Size: Normal)   Pulse 77   Temp 97.9 F (36.6 C)   Ht 5\' 2"  (1.575 m)   Wt 163 lb 12.8 oz (74.3 kg)   BMI 29.96 kg/m  - Sitting  BP 100/73 (BP Location: Right Arm, Patient Position: Standing, Cuff Size: Normal)   Pulse 83   Temp 97.9 F (36.6 C)   Ht 5\' 2"  (1.575 m)   Wt 163 lb 12.8 oz (74.3 kg)   BMI 29.96 kg/m - Standing   Wt Readings from Last 3 Encounters:  03/09/16 163 lb 12.8 oz (74.3 kg)  03/07/16 166 lb 12.8 oz (75.7 kg)  02/08/16 168 lb 11.2 oz (76.5 kg)

## 2016-03-09 NOTE — Telephone Encounter (Signed)
per pof to sch pt appt-gave pt copy of avs/cal-sent IM message to forward record to [facility]

## 2016-03-11 ENCOUNTER — Telehealth: Payer: Self-pay | Admitting: Radiation Oncology

## 2016-03-11 NOTE — Telephone Encounter (Signed)
I called the patient to communicate the need for vaginal dilators and she will be meeting with pelvic floor rehab. I will leave two dilators, a small and a medium for her to pick up and use with Romie Jumper.

## 2016-03-14 ENCOUNTER — Ambulatory Visit (HOSPITAL_COMMUNITY)
Admission: RE | Admit: 2016-03-14 | Discharge: 2016-03-14 | Disposition: A | Discharge status: Home / Self Care | Payer: 59 | Source: Ambulatory Visit | Attending: Hematology | Admitting: Hematology

## 2016-03-14 ENCOUNTER — Encounter (HOSPITAL_COMMUNITY): Payer: Self-pay

## 2016-03-14 ENCOUNTER — Encounter (HOSPITAL_COMMUNITY)
Admission: RE | Admit: 2016-03-14 | Discharge: 2016-03-14 | Disposition: A | Discharge status: Home / Self Care | Payer: 59 | Source: Ambulatory Visit | Attending: General Surgery | Admitting: General Surgery

## 2016-03-14 DIAGNOSIS — Z0189 Encounter for other specified special examinations: Secondary | ICD-10-CM | POA: Diagnosis present

## 2016-03-14 DIAGNOSIS — C20 Malignant neoplasm of rectum: Secondary | ICD-10-CM

## 2016-03-14 HISTORY — DX: Heartburn: R12

## 2016-03-14 HISTORY — DX: Allergy status to unspecified drugs, medicaments and biological substances: Z88.9

## 2016-03-14 HISTORY — DX: Syncope and collapse: R55

## 2016-03-14 HISTORY — DX: Unspecified osteoarthritis, unspecified site: M19.90

## 2016-03-14 HISTORY — DX: Anemia, unspecified: D64.9

## 2016-03-14 LAB — CBC
HCT: 35.5 % — ABNORMAL LOW (ref 36.0–46.0)
Hemoglobin: 12 g/dL (ref 12.0–15.0)
MCH: 31.4 pg (ref 26.0–34.0)
MCHC: 33.8 g/dL (ref 30.0–36.0)
MCV: 92.9 fL (ref 78.0–100.0)
Platelets: 250 10*3/uL (ref 150–400)
RBC: 3.82 MIL/uL — ABNORMAL LOW (ref 3.87–5.11)
RDW: 14.7 % (ref 11.5–15.5)
WBC: 2.5 10*3/uL — ABNORMAL LOW (ref 4.0–10.5)

## 2016-03-14 LAB — COMPREHENSIVE METABOLIC PANEL
ALT: 10 U/L — ABNORMAL LOW (ref 14–54)
AST: 21 U/L (ref 15–41)
Albumin: 4.2 g/dL (ref 3.5–5.0)
Alkaline Phosphatase: 75 U/L (ref 38–126)
Anion gap: 5 (ref 5–15)
BUN: 7 mg/dL (ref 6–20)
CO2: 29 mmol/L (ref 22–32)
Calcium: 9 mg/dL (ref 8.9–10.3)
Chloride: 107 mmol/L (ref 101–111)
Creatinine, Ser: 0.71 mg/dL (ref 0.44–1.00)
GFR calc Af Amer: >60 mL/min (ref >60)
GFR calc non Af Amer: >60 mL/min (ref >60)
Glucose, Bld: 85 mg/dL (ref 65–99)
Potassium: 4.3 mmol/L (ref 3.5–5.1)
Sodium: 141 mmol/L (ref 135–145)
Total Bilirubin: 0.9 mg/dL (ref 0.3–1.2)
Total Protein: 7.4 g/dL (ref 6.5–8.1)

## 2016-03-14 LAB — ABO/RH: ABO/RH(D): A POS

## 2016-03-14 MED ORDER — IOPAMIDOL (ISOVUE-300) INJECTION 61%
100.0000 mL | Freq: Once | INTRAVENOUS | Status: AC | PRN
  Administered 2016-03-14: 100 mL via INTRAVENOUS

## 2016-03-14 NOTE — Pre-Procedure Instructions (Signed)
CT Chest 5'17 Epic.

## 2016-03-14 NOTE — Patient Instructions (Addendum)
Noraa Schaumann  03/14/2016   Your procedure is scheduled on: 03-21-16  Report to [facility]  Entrance take [facility]  elevators to 3rd floor to  [place] at 0600 AM.  Call this number if you have problems the morning of surgery [phone] Clear liquids plentiful day of bowel prep(follow bowel prep instructions per Doctor office).   CLEAR LIQUID DIET   Foods Allowed                                                                     Foods Excluded  Coffee and tea, regular and decaf                             liquids that you cannot  Plain Jell-O in any flavor                                             see through such as: Fruit ices (not with fruit pulp)                                     milk, soups, orange juice  Iced Popsicles                                    All solid food Carbonated beverages, regular and diet                                    Cranberry, grape and apple juices Sports drinks like Gatorade Lightly seasoned clear broth or consume(fat free) Sugar, honey syrup  _____________________________________________________________________     Remember: ONLY 1 PERSON MAY GO WITH YOU TO SHORT STAY TO GET  READY MORNING OF YOUR SURGERY.  Do not eat food or drink liquids :After Midnight.     Take these medicines the morning of surgery with A SIP OF WATER: NONE. DO NOT TAKE ANY DIABETIC MEDICATIONS DAY OF YOUR SURGERY                               You may not have any metal on your body including hair pins and              piercings  Do not wear jewelry, make-up, lotions, powders or perfumes, deodorant             Do not wear nail polish.  Do not shave  48 hours prior to surgery.              Men may shave face and neck.   Do not bring valuables to the hospital. [place].  Contacts, dentures or bridgework may not be worn into surgery.  Leave suitcase in the car. After surgery it  may be brought to your room.     Patients discharged the day of surgery will not be allowed to drive home.  Name and phone number of your driver:  Special Instructions: N/A              Please read over the following fact sheets you were given: _____________________________________________________________________             [facility] - Preparing for Surgery Before surgery, you can play an important role.  Because skin is not sterile, your skin needs to be as free of germs as possible.  You can reduce the number of germs on your skin by washing with CHG (chlorahexidine gluconate) soap before surgery.  CHG is an antiseptic cleaner which kills germs and bonds with the skin to continue killing germs even after washing. Please DO NOT use if you have an allergy to CHG or antibacterial soaps.  If your skin becomes reddened/irritated stop using the CHG and inform your nurse when you arrive at Short Stay. Do not shave (including legs and underarms) for at least 48 hours prior to the first CHG shower.  You may shave your face/neck. Please follow these instructions carefully:  1.  Shower with CHG Soap the night before surgery and the  morning of Surgery.  2.  If you choose to wash your hair, wash your hair first as usual with your  normal  shampoo.  3.  After you shampoo, rinse your hair and body thoroughly to remove the  shampoo.                           4.  Use CHG as you would any other liquid soap.  You can apply chg directly  to the skin and wash                       Gently with a scrungie or clean washcloth.  5.  Apply the CHG Soap to your body ONLY FROM THE NECK DOWN.   Do not use on face/ open                           Wound or open sores. Avoid contact with eyes, ears mouth and genitals (private parts).                       Wash face,  Genitals (private parts) with your normal soap.             6.  Wash thoroughly, paying special attention to the area where your surgery  will be  performed.  7.  Thoroughly rinse your body with warm water from the neck down.  8.  DO NOT shower/wash with your normal soap after using and rinsing off  the CHG Soap.                9.  Pat yourself dry with a clean towel.            10.  Wear clean pajamas.            11.  Place clean sheets on your bed the night of your first shower and do not  sleep with pets. Day of Surgery : Do not apply any lotions/deodorants the  morning of surgery.  Please wear clean clothes to the hospital/surgery center.  FAILURE TO FOLLOW THESE INSTRUCTIONS MAY RESULT IN THE CANCELLATION OF YOUR SURGERY PATIENT SIGNATURE_________________________________  NURSE SIGNATURE__________________________________  ________________________________________________________________________

## 2016-03-16 ENCOUNTER — Encounter: Payer: Self-pay | Admitting: *Deleted

## 2016-03-16 ENCOUNTER — Telehealth: Payer: Self-pay | Admitting: *Deleted

## 2016-03-16 LAB — HEMOGLOBIN A1C
Hgb A1c MFr Bld: 5 % (ref 4.8–5.6)
Mean Plasma Glucose: 97 mg/dL

## 2016-03-16 NOTE — Addendum Note (Signed)
Encounter addended by: Jacqulyn Liner, RN on: 03/16/2016  4:23 PM<BR>    Actions taken: Sign clinical note

## 2016-03-16 NOTE — Progress Notes (Signed)
  Home Care Instructions for the Insertion and Care of Your Vaginal Dilator  Why Do I Need a Vaginal Dilator?  Internal radiation therapy may cause scar tissue to form at the top of your vagina (vaginal cuff).  This may make vaginal examinations difficult in the future. You can prevent scar tissue from forming by using a vaginal dilator (a smooth plastic rod), and/or by having regular sexual intercourse.  If not using the dilator you should be having intercourse two or three times a week.  If you are unable to have intercourse, you should use your vaginal dilator.  You may have some spotting or bleeding from your dilator or intercourse the first few times. You may also have some discomfort. If discomfort occurs with intercourse, you and your partner may need to stop for a while and try again later.  How to Use Your Vaginal Dilator  - Wash the dilator with soap and water before and after each use. - Check the dilator to be sure it is smooth. Do not use the dilator if you find any roughspots. - Coat the dilator with K-Y Jelly, Astroglide, or Replens. Do not use Vaseline, baby oil, or other oil based lubricants. They are not water-soluble and can be irritating to the tissues in the vagina. - Lie on your back with your knees bent and legs apart. - Insert the rounded end of the dilator into your vagina as far as it will go without causing pain or discomfort. - Close your knees and slowly straighten your legs. - Keep the dilator in your vagina for about 10 to 15 minutes. - Bend your knees, open your legs, and gently remove the dilator. - Gently cleanse the skin around the vaginal opening. - Wash the dilator after each use. -  It is important that you use the dilator routinely until instructed otherwise by your doctor.   

## 2016-03-16 NOTE — Telephone Encounter (Signed)
FYI "I was there Monday, saw Dr. Burr Medico.  Lab and scans were ordered.  I need results before surgery Monday.  She wanted to make sure we aren't missing anything.  What is my bilirubin?"  Will notify provider of this call.  Return number [phone].   Provider not in office rest of the week.   Called patient, message left on voicemail that recent bilirubin 0.9 and CT scan shows no metastatic disease.

## 2016-03-16 NOTE — Progress Notes (Signed)
[facility] Healthcare Advance Directives Clinical Social Work  Clinical Social Work was referred by pt to review and complete healthcare advance directives.  Clinical Social Worker met with patient in CSW office.  The patient designated Kaitlyn Morgan as their primary healthcare agent and did not designate a secondary agent.  Patient also completed healthcare living will.    Clinical Social Worker notarized documents and made copies for patient/family. Clinical Social Worker will send documents to medical records to be scanned into patient's chart. Clinical Social Worker encouraged patient/family to contact with any additional questions or concerns.  [name], LCSW Clinical Social Worker [facility]  [facility] Phone: [phone] Fax: [phone]         

## 2016-03-17 ENCOUNTER — Encounter: Payer: Self-pay | Admitting: Physical Therapy

## 2016-03-17 ENCOUNTER — Ambulatory Visit: Payer: 59 | Attending: Hematology | Admitting: Physical Therapy

## 2016-03-17 DIAGNOSIS — M62838 Other muscle spasm: Secondary | ICD-10-CM | POA: Diagnosis present

## 2016-03-17 DIAGNOSIS — M6281 Muscle weakness (generalized): Secondary | ICD-10-CM | POA: Insufficient documentation

## 2016-03-17 NOTE — Patient Instructions (Signed)
Guide to Using a Vaginal Dilator During anal Cancer treatment 1. Use the dilator you and your therapist agreed on 2.  Position yourself in a reclined position on your back with your knees supported. 3. Spread the skin away from the vaginal opening. 4. Do a maximum contraction of the pelvic floor muscles. Tighten the vagina and the anus maximally and then relax.  5. When you know the muscles are relaxed, gently and slowly insert the dilator into your vagina directing it slightly downward for 2-3 inches of insertion. Stop when you feel pain no greater than 3/10.  6.  Allow the dilator to stay in place in a position that does not increase discomfort greater than 3-4/10 for 10 minutes.  7.  Repeat this process daily. 8. If you begin to blister, you can stop using the dilator until the skin heals.  Once the skin heals (2-4 weeks after radiation) resume using the dilator to prevent vaginal stenosis, closing of the vaginal canal.  9. 1st month use dilator for 5x/week, 2nd to 6th month use 3x/week, after 6 months use 2-3x/week for 2 years.   When you can use the dilator with no increase in pain you can go to the next size.   Dilator to order [facility]  size #6 [id number]   Order from [email or web address] or call [phone]    Start after stitches are removed from abdomen, 2 weeks       [facility] [address] [place], [place] [address] Phone # [phone] Fax [phone]

## 2016-03-17 NOTE — Therapy (Signed)
[facility] [address], [place], [place], [address] Phone: [phone]   Fax:  [phone]  Physical Therapy Treatment  Patient Details  Name: Kaitlyn Morgan MRN: [id number] Date of Birth: [birth year] Referring Provider: Dr. Truitt Merle  Encounter Date: 03/17/2016      PT End of Session - 03/17/16 1634    Visit Number 4   Date for PT Re-Evaluation 04/21/16   PT Start Time 1634  Patient was 15 min late   PT Stop Time 1700   PT Time Calculation (min) 26 min   Activity Tolerance Patient tolerated treatment well   Behavior During Therapy [facility] for tasks assessed/performed      Past Medical History:  Diagnosis Date  . Anemia    during pregnancy, not now.  . 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 Atypical syncope    1 episode -never had follow up with cardiology-never any further episodes, did go to urgent care.  . Colon cancer [facility])    rectal cancer Radiation and oral chemo with radiation.  . Family history of breast cancer   . Family history of uterine cancer   . Heart burn    occ.  Marland Kitchen Hx of seasonal allergies     Past Surgical History:  Procedure Laterality Date  . 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 CESAREAN SECTION    . DIAGNOSTIC LAPAROSCOPY     with D and C and Ovarian cystectomy, tubal ligation done at this time '00  . DILATION AND CURETTAGE OF UTERUS    . TUBAL LIGATION      There were no vitals filed for this visit.      Subjective Assessment - 03/17/16 1636    Subjective I have surgery on 03/21/2016.  I had a CT scan. I have been having stomach aches and cramping and started 1 month ago.    Patient Stated Goals understand how to strengthen the pelvic floor   Currently in Pain? Yes   Pain Location Abdomen   Pain Orientation Lower   Pain Descriptors / Indicators Cramping   Pain Type Chronic pain   Pain Onset More than a month ago    Pain Frequency Intermittent   Aggravating Factors  sitting   Pain Relieving Factors not sitting                         [facility] Adult PT Treatment/Exercise - 03/17/16 0001      Self-Care   Self-Care Other Self-Care Comments   Other Self-Care Comments  education on how to use the vaginal dilator for preventing stenosis, when to use after surgery, and to use for 2 years                PT Education - 03/17/16 1659    Education provided Yes   Education Details how to use a dilator to reduce chances of vaginal stenosis   Person(s) Educated Patient   Methods Explanation;Demonstration;Handout   Comprehension Verbalized understanding;Returned demonstration          PT Short Term Goals - 03/17/16 1703      PT SHORT TERM GOAL #1   Title understand how to manage vaginal dryness   Time 4   Period Weeks   Status Achieved     PT SHORT TERM GOAL #2   Title when have the urge for a bowel movement able to  wait 5 min   Time 4   Period Weeks   Status Achieved     PT SHORT TERM GOAL #3   Title independent with flexibility exercises   Time 4   Period Weeks   Status Achieved     PT SHORT TERM GOAL #4   Title understand correct toileting technique   Time 4   Period Weeks   Status Achieved           PT Long Term Goals - 03/17/16 1640      PT LONG TERM GOAL #1   Title independent with HEP   Time 8   Period Weeks   Status Achieved     PT LONG TERM GOAL #2   Title pelvic floor strength is >/= 4/5   Time 8   Period Weeks   Status Unable to assess     PT LONG TERM GOAL #3   Title ability to hold bowel movement to get to the bathroom in time without urgency   Time 8   Period Weeks   Status Achieved     PT LONG TERM GOAL #4   Title pain with sitting decreased >/= 50% due to improved tissue flexibility   Time 8   Period Weeks   Status Not Met  40%     PT LONG TERM GOAL #5   Title understand how to use a dilator to maintain the vaginal  opening after radiation   Time 8   Period Weeks   Status Achieved               Plan - 03/17/16 1700    Clinical Impression Statement Patient is going to have surgery on 03/21/2016 for removal of the tumor. Patient has met her goals in physical therapy. Patient was instructed on how to use the vaginal dilator to prevent vaginal stenosis. Patient is having constant cramping in the lower abdominal area.  Patient reports no urinary and fecal leakage.  Patient  does not have the urgency with bowel movements.  Patient will be discharged at this time.  If she needs a new referral then would require a new script.    Rehab Potential Good   Clinical Impairments Affecting Rehab Potential will be having surgery for the rectal tumor in end of August   PT Treatment/Interventions Biofeedback;Therapeutic activities;Therapeutic exercise;Patient/family education;Neuromuscular re-education;Manual techniques   PT Next Visit Plan Discharge this visit   PT Home Exercise Plan current HEP   Consulted and Agree with Plan of Care Patient      Patient will benefit from skilled therapeutic intervention in order to improve the following deficits and impairments:  Pain, Decreased coordination, Decreased endurance, Decreased strength  Visit Diagnosis: Muscle weakness (generalized)  Other muscle spasm     Problem List Patient Active Problem List   Diagnosis Date Noted  . Genetic testing 02/19/2016  . Family history of breast cancer   . Family history of uterine cancer   . Rectal cancer ([place]) 12/18/2015    Earlie Counts, PT 03/17/16 5:07 PM   [facility] [address] [place], [place], [address] Phone: [phone]   Fax:  [phone]  Name: Kaitlyn Morgan MRN: [id number] Date of Birth: 11-12-1965  PHYSICAL THERAPY DISCHARGE SUMMARY  Visits from Start of Care: 4  Current functional level related to goals / functional  outcomes: See above   Remaining deficits: Rectal pain decreased by 40% instead of 50%.   Education / Equipment: HEP  Plan: Patient agrees to discharge.  Patient goals were partially met. Patient is being discharged due to a change in medical status. Patient is having surgery on 03/21/2016. Thank you for the referral.  Earlie Counts, PT 03/17/16 5:07 PM   ?????

## 2016-03-20 NOTE — Anesthesia Preprocedure Evaluation (Addendum)
Anesthesia Evaluation  Patient identified by MRN, date of birth, ID band Patient awake    Reviewed: Allergy & Precautions, NPO status , Patient's Chart, lab work & pertinent test results  Airway Mallampati: II  TM Distance: >3 FB Neck ROM: Full    Dental  (+) Teeth Intact, Dental Advisory Given   Pulmonary neg pulmonary ROS,    breath sounds clear to auscultation       Cardiovascular negative cardio ROS   Rhythm:Regular Rate:Normal     Neuro/Psych negative neurological ROS  negative psych ROS   GI/Hepatic negative GI ROS, Neg liver ROS,   Endo/Other  negative endocrine ROS  Renal/GU negative Renal ROS  negative genitourinary   Musculoskeletal  (+) Arthritis ,   Abdominal   Peds negative pediatric ROS (+)  Hematology negative hematology ROS (+)   Anesthesia Other Findings   Reproductive/Obstetrics negative OB ROS                            Lab Results  Component Value Date   WBC 2.5 (L) 03/14/2016   HGB 12.0 03/14/2016   HCT 35.5 (L) 03/14/2016   MCV 92.9 03/14/2016   PLT 250 03/14/2016   Lab Results  Component Value Date   CREATININE 0.71 03/14/2016   BUN 7 03/14/2016   NA 141 03/14/2016   K 4.3 03/14/2016   CL 107 03/14/2016   CO2 29 03/14/2016   No results found for: INR, PROTIME   Anesthesia Physical Anesthesia Plan  ASA: II  Anesthesia Plan: General   Post-op Pain Management:    Induction: Intravenous  Airway Management Planned: Oral ETT  Additional Equipment:   Intra-op Plan:   Post-operative Plan: Extubation in OR  Informed Consent: I have reviewed the patients History and Physical, chart, labs and discussed the procedure including the risks, benefits and alternatives for the proposed anesthesia with the patient or authorized representative who has indicated his/her understanding and acceptance.   Dental advisory given  Plan Discussed with:  CRNA  Anesthesia Plan Comments: (2 PIV's)        Anesthesia Quick Evaluation

## 2016-03-21 ENCOUNTER — Encounter (HOSPITAL_COMMUNITY): Payer: Self-pay | Admitting: Certified Registered Nurse Anesthetist

## 2016-03-21 ENCOUNTER — Inpatient Hospital Stay (HOSPITAL_COMMUNITY)
Admission: RE | Admit: 2016-03-21 | Discharge: 2016-03-26 | DRG: 330 | Disposition: A | Discharge status: Home / Self Care | Payer: 59 | Source: Ambulatory Visit | Attending: General Surgery | Admitting: General Surgery

## 2016-03-21 ENCOUNTER — Encounter (HOSPITAL_COMMUNITY)
Admission: RE | Disposition: A | Discharge status: Home / Self Care | Payer: Self-pay | Source: Ambulatory Visit | Attending: General Surgery

## 2016-03-21 ENCOUNTER — Inpatient Hospital Stay (HOSPITAL_COMMUNITY): Payer: 59 | Admitting: Anesthesiology

## 2016-03-21 DIAGNOSIS — C772 Secondary and unspecified malignant neoplasm of intra-abdominal lymph nodes: Secondary | ICD-10-CM | POA: Diagnosis present

## 2016-03-21 DIAGNOSIS — C20 Malignant neoplasm of rectum: Secondary | ICD-10-CM | POA: Diagnosis present

## 2016-03-21 DIAGNOSIS — Z803 Family history of malignant neoplasm of breast: Secondary | ICD-10-CM | POA: Diagnosis not present

## 2016-03-21 DIAGNOSIS — Z90711 Acquired absence of uterus with remaining cervical stump: Secondary | ICD-10-CM | POA: Diagnosis not present

## 2016-03-21 DIAGNOSIS — Z9221 Personal history of antineoplastic chemotherapy: Secondary | ICD-10-CM

## 2016-03-21 HISTORY — PX: XI ROBOTIC ASSISTED LOWER ANTERIOR RESECTION: SHX6558

## 2016-03-21 HISTORY — DX: Other specified symptoms and signs involving the digestive system and abdomen: R19.8

## 2016-03-21 HISTORY — DX: Other specified postprocedural states: Z98.890

## 2016-03-21 HISTORY — DX: Motion sickness, initial encounter: T75.3XXA

## 2016-03-21 HISTORY — DX: Nausea with vomiting, unspecified: R11.2

## 2016-03-21 HISTORY — DX: Unspecified condition associated with female genital organs and menstrual cycle: N94.9

## 2016-03-21 HISTORY — DX: Adverse effect of unspecified anesthetic, initial encounter: T41.45XA

## 2016-03-21 HISTORY — DX: Other complications of anesthesia, initial encounter: T88.59XA

## 2016-03-21 LAB — TYPE AND SCREEN
ABO/RH(D): A POS
Antibody Screen: NEGATIVE

## 2016-03-21 SURGERY — RESECTION, RECTUM, LOW ANTERIOR, ROBOT-ASSISTED
Anesthesia: General | Site: Abdomen

## 2016-03-21 MED ORDER — PHENYLEPHRINE 40 MCG/ML (10ML) SYRINGE FOR IV PUSH (FOR BLOOD PRESSURE SUPPORT)
PREFILLED_SYRINGE | INTRAVENOUS | Status: AC
  Filled 2016-03-21: qty 10

## 2016-03-21 MED ORDER — GABAPENTIN 300 MG PO CAPS
300.0000 mg | ORAL_CAPSULE | ORAL | Status: AC
  Administered 2016-03-21: 300 mg via ORAL
  Filled 2016-03-21: qty 1

## 2016-03-21 MED ORDER — ONDANSETRON HCL 4 MG/2ML IJ SOLN
4.0000 mg | Freq: Four times a day (QID) | INTRAMUSCULAR | Status: DC | PRN
  Administered 2016-03-23: 4 mg via INTRAVENOUS
  Filled 2016-03-21: qty 2

## 2016-03-21 MED ORDER — PROPOFOL 10 MG/ML IV BOLUS
INTRAVENOUS | Status: DC | PRN
  Administered 2016-03-21: 200 mg via INTRAVENOUS

## 2016-03-21 MED ORDER — HYDROMORPHONE HCL 2 MG/ML IJ SOLN
INTRAMUSCULAR | Status: AC
  Filled 2016-03-21: qty 1

## 2016-03-21 MED ORDER — BUPIVACAINE-EPINEPHRINE 0.25% -1:200000 IJ SOLN
INTRAMUSCULAR | Status: DC | PRN
  Administered 2016-03-21: 30 mL

## 2016-03-21 MED ORDER — DEXTROSE 5 % IV SOLN
2.0000 g | Freq: Two times a day (BID) | INTRAVENOUS | Status: AC
  Administered 2016-03-21: 2 g via INTRAVENOUS
  Filled 2016-03-21: qty 2

## 2016-03-21 MED ORDER — MEPERIDINE HCL 50 MG/ML IJ SOLN: 6.2500 mg | INTRAMUSCULAR | Status: DC | PRN

## 2016-03-21 MED ORDER — ROCURONIUM BROMIDE 100 MG/10ML IV SOLN
INTRAVENOUS | Status: AC
  Filled 2016-03-21: qty 1

## 2016-03-21 MED ORDER — ESTRADIOL 2 MG PO TABS
2.0000 mg | ORAL_TABLET | Freq: Every day | ORAL | Status: DC
  Filled 2016-03-21 (×6): qty 1

## 2016-03-21 MED ORDER — DEXTROSE 5 % IV SOLN
2.0000 g | INTRAVENOUS | Status: AC
  Administered 2016-03-21: 2 g via INTRAVENOUS

## 2016-03-21 MED ORDER — FENTANYL CITRATE (PF) 250 MCG/5ML IJ SOLN
INTRAMUSCULAR | Status: AC
  Filled 2016-03-21: qty 5

## 2016-03-21 MED ORDER — HYDROMORPHONE HCL 1 MG/ML IJ SOLN
INTRAMUSCULAR | Status: DC | PRN
  Administered 2016-03-21: 1 mg via INTRAVENOUS

## 2016-03-21 MED ORDER — ONDANSETRON HCL 4 MG PO TABS: 4.0000 mg | ORAL_TABLET | Freq: Four times a day (QID) | ORAL | Status: DC | PRN

## 2016-03-21 MED ORDER — MIDAZOLAM HCL 5 MG/5ML IJ SOLN
INTRAMUSCULAR | Status: DC | PRN
Start: 2016-03-21 — End: 2016-03-21
  Administered 2016-03-21: 2 mg via INTRAVENOUS

## 2016-03-21 MED ORDER — PHENYLEPHRINE HCL 10 MG/ML IJ SOLN
INTRAMUSCULAR | Status: DC | PRN
Start: 2016-03-21 — End: 2016-03-21
  Administered 2016-03-21: 80 ug via INTRAVENOUS

## 2016-03-21 MED ORDER — LACTATED RINGERS IR SOLN
Status: DC | PRN
  Administered 2016-03-21: 1000 mL

## 2016-03-21 MED ORDER — BUPIVACAINE-EPINEPHRINE (PF) 0.25% -1:200000 IJ SOLN
INTRAMUSCULAR | Status: AC
  Filled 2016-03-21: qty 30

## 2016-03-21 MED ORDER — HYDROMORPHONE HCL 1 MG/ML IJ SOLN
0.5000 mg | INTRAMUSCULAR | Status: DC | PRN
  Administered 2016-03-22: 0.5 mg via INTRAVENOUS
  Administered 2016-03-23: 1 mg via INTRAVENOUS
  Filled 2016-03-21 (×2): qty 1

## 2016-03-21 MED ORDER — SCOPOLAMINE 1 MG/3DAYS TD PT72
MEDICATED_PATCH | TRANSDERMAL | Status: DC | PRN
  Administered 2016-03-21: 1 via TRANSDERMAL

## 2016-03-21 MED ORDER — KCL IN DEXTROSE-NACL 20-5-0.45 MEQ/L-%-% IV SOLN
INTRAVENOUS | Status: DC
  Administered 2016-03-21: 14:00:00 via INTRAVENOUS
  Administered 2016-03-22 – 2016-03-23 (×2): 1000 mL via INTRAVENOUS
  Administered 2016-03-23 – 2016-03-25 (×2): via INTRAVENOUS
  Filled 2016-03-21 (×9): qty 1000

## 2016-03-21 MED ORDER — ONDANSETRON HCL 4 MG/2ML IJ SOLN
INTRAMUSCULAR | Status: AC
  Filled 2016-03-21: qty 2

## 2016-03-21 MED ORDER — MIDAZOLAM HCL 2 MG/2ML IJ SOLN
INTRAMUSCULAR | Status: AC
  Filled 2016-03-21: qty 2

## 2016-03-21 MED ORDER — LACTATED RINGERS IV SOLN
INTRAVENOUS | Status: DC | PRN
  Administered 2016-03-21 (×2): via INTRAVENOUS

## 2016-03-21 MED ORDER — LIDOCAINE HCL (CARDIAC) 20 MG/ML IV SOLN
INTRAVENOUS | Status: AC
  Filled 2016-03-21: qty 5

## 2016-03-21 MED ORDER — DEXAMETHASONE SODIUM PHOSPHATE 10 MG/ML IJ SOLN
INTRAMUSCULAR | Status: AC
  Filled 2016-03-21: qty 1

## 2016-03-21 MED ORDER — LIDOCAINE HCL (CARDIAC) 20 MG/ML IV SOLN
INTRAVENOUS | Status: DC | PRN
Start: 2016-03-21 — End: 2016-03-21
  Administered 2016-03-21: 80 mg via INTRATRACHEAL

## 2016-03-21 MED ORDER — CELECOXIB 200 MG PO CAPS
400.0000 mg | ORAL_CAPSULE | ORAL | Status: AC
  Administered 2016-03-21: 400 mg via ORAL
  Filled 2016-03-21: qty 2

## 2016-03-21 MED ORDER — ONDANSETRON HCL 4 MG/2ML IJ SOLN
INTRAMUSCULAR | Status: DC | PRN
  Administered 2016-03-21: 4 mg via INTRAVENOUS

## 2016-03-21 MED ORDER — SUGAMMADEX SODIUM 200 MG/2ML IV SOLN
INTRAVENOUS | Status: AC
  Filled 2016-03-21: qty 2

## 2016-03-21 MED ORDER — PROMETHAZINE HCL 25 MG/ML IJ SOLN
INTRAMUSCULAR | Status: AC
  Filled 2016-03-21: qty 1

## 2016-03-21 MED ORDER — ALVIMOPAN 12 MG PO CAPS
12.0000 mg | ORAL_CAPSULE | Freq: Two times a day (BID) | ORAL | Status: DC
  Administered 2016-03-22 – 2016-03-24 (×3): 12 mg via ORAL
  Filled 2016-03-21 (×5): qty 1

## 2016-03-21 MED ORDER — CEFOTETAN DISODIUM-DEXTROSE 2-2.08 GM-% IV SOLR
INTRAVENOUS | Status: AC
  Filled 2016-03-21: qty 50

## 2016-03-21 MED ORDER — PROMETHAZINE HCL 25 MG/ML IJ SOLN
6.2500 mg | INTRAMUSCULAR | Status: DC | PRN
  Administered 2016-03-21: 12.5 mg via INTRAVENOUS

## 2016-03-21 MED ORDER — ALUM & MAG HYDROXIDE-SIMETH 200-200-20 MG/5ML PO SUSP
30.0000 mL | Freq: Four times a day (QID) | ORAL | Status: DC | PRN
  Administered 2016-03-22: 30 mL via ORAL
  Filled 2016-03-21: qty 30

## 2016-03-21 MED ORDER — DIPHENHYDRAMINE HCL 50 MG/ML IJ SOLN: 25.0000 mg | Freq: Four times a day (QID) | INTRAMUSCULAR | Status: DC | PRN

## 2016-03-21 MED ORDER — DEXAMETHASONE SODIUM PHOSPHATE 10 MG/ML IJ SOLN
INTRAMUSCULAR | Status: DC | PRN
  Administered 2016-03-21: 10 mg via INTRAVENOUS

## 2016-03-21 MED ORDER — ACETAMINOPHEN 500 MG PO TABS
1000.0000 mg | ORAL_TABLET | Freq: Four times a day (QID) | ORAL | Status: AC
  Administered 2016-03-21 – 2016-03-22 (×3): 1000 mg via ORAL
  Filled 2016-03-21 (×3): qty 2

## 2016-03-21 MED ORDER — ROCURONIUM BROMIDE 100 MG/10ML IV SOLN
INTRAVENOUS | Status: DC | PRN
  Administered 2016-03-21: 20 mg via INTRAVENOUS
  Administered 2016-03-21: 50 mg via INTRAVENOUS

## 2016-03-21 MED ORDER — ALVIMOPAN 12 MG PO CAPS
12.0000 mg | ORAL_CAPSULE | Freq: Once | ORAL | Status: AC
  Administered 2016-03-21: 12 mg via ORAL
  Filled 2016-03-21: qty 1

## 2016-03-21 MED ORDER — DIPHENHYDRAMINE HCL 25 MG PO CAPS: 25.0000 mg | ORAL_CAPSULE | Freq: Four times a day (QID) | ORAL | Status: DC | PRN

## 2016-03-21 MED ORDER — FENTANYL CITRATE (PF) 250 MCG/5ML IJ SOLN
INTRAMUSCULAR | Status: DC | PRN
  Administered 2016-03-21: 50 ug via INTRAVENOUS
  Administered 2016-03-21 (×3): 100 ug via INTRAVENOUS

## 2016-03-21 MED ORDER — LACTATED RINGERS IV SOLN
INTRAVENOUS | Status: DC | PRN
  Administered 2016-03-21: 09:00:00 via INTRAVENOUS

## 2016-03-21 MED ORDER — BUPIVACAINE LIPOSOME 1.3 % IJ SUSP
20.0000 mL | INTRAMUSCULAR | Status: DC
  Filled 2016-03-21: qty 20

## 2016-03-21 MED ORDER — ENOXAPARIN SODIUM 40 MG/0.4ML ~~LOC~~ SOLN
40.0000 mg | SUBCUTANEOUS | Status: DC
  Administered 2016-03-22 – 2016-03-26 (×5): 40 mg via SUBCUTANEOUS
  Filled 2016-03-21 (×5): qty 0.4

## 2016-03-21 MED ORDER — PROPOFOL 10 MG/ML IV BOLUS
INTRAVENOUS | Status: AC
  Filled 2016-03-21: qty 20

## 2016-03-21 MED ORDER — LACTATED RINGERS IV SOLN: INTRAVENOUS | Status: DC

## 2016-03-21 MED ORDER — SCOPOLAMINE 1 MG/3DAYS TD PT72
MEDICATED_PATCH | TRANSDERMAL | Status: AC
  Filled 2016-03-21: qty 1

## 2016-03-21 MED ORDER — SUGAMMADEX SODIUM 200 MG/2ML IV SOLN
INTRAVENOUS | Status: DC | PRN
  Administered 2016-03-21: 153.4 mg via INTRAVENOUS

## 2016-03-21 MED ORDER — HYDROMORPHONE HCL 1 MG/ML IJ SOLN: 0.2500 mg | INTRAMUSCULAR | Status: DC | PRN

## 2016-03-21 MED ORDER — ACETAMINOPHEN 500 MG PO TABS
1000.0000 mg | ORAL_TABLET | ORAL | Status: AC
  Administered 2016-03-21: 1000 mg via ORAL
  Filled 2016-03-21: qty 2

## 2016-03-21 MED ORDER — SODIUM CHLORIDE 0.9 % IR SOLN
Status: DC | PRN
  Administered 2016-03-21: 2000 mL

## 2016-03-21 MED ORDER — SUCCINYLCHOLINE CHLORIDE 20 MG/ML IJ SOLN
INTRAMUSCULAR | Status: DC | PRN
  Administered 2016-03-21: 100 mg via INTRAVENOUS

## 2016-03-21 SURGICAL SUPPLY — 93 items
BLADE EXTENDED COATED 6.5IN (ELECTRODE) IMPLANT
CANNULA REDUC XI 12-8 STAPL (CANNULA) ×1
CANNULA REDUCER 12-8 DVNC XI (CANNULA) ×1 IMPLANT
CELLS DAT CNTRL 66122 CELL SVR (MISCELLANEOUS) IMPLANT
CLIP LIGATING HEM O LOK PURPLE (MISCELLANEOUS) IMPLANT
CLIP LIGATING HEMOLOK MED (MISCELLANEOUS) IMPLANT
COUNTER NEEDLE 20 DBL MAG RED (NEEDLE) ×2 IMPLANT
COVER MAYO STAND STRL (DRAPES) ×4 IMPLANT
COVER TIP SHEARS 8 DVNC (MISCELLANEOUS) ×1 IMPLANT
COVER TIP SHEARS 8MM DA VINCI (MISCELLANEOUS) ×1
DECANTER SPIKE VIAL GLASS SM (MISCELLANEOUS) ×2 IMPLANT
DEVICE TROCAR PUNCTURE CLOSURE (ENDOMECHANICALS) IMPLANT
DRAIN CHANNEL 19F RND (DRAIN) IMPLANT
DRAPE ARM DVNC X/XI (DISPOSABLE) ×4 IMPLANT
DRAPE COLUMN DVNC XI (DISPOSABLE) ×1 IMPLANT
DRAPE DA VINCI XI ARM (DISPOSABLE) ×4
DRAPE DA VINCI XI COLUMN (DISPOSABLE) ×1
DRAPE SURG IRRIG POUCH 19X23 (DRAPES) ×2 IMPLANT
DRSG OPSITE POSTOP 4X10 (GAUZE/BANDAGES/DRESSINGS) IMPLANT
DRSG OPSITE POSTOP 4X6 (GAUZE/BANDAGES/DRESSINGS) ×1 IMPLANT
DRSG OPSITE POSTOP 4X8 (GAUZE/BANDAGES/DRESSINGS) IMPLANT
DRSG TEGADERM 4X4.75 (GAUZE/BANDAGES/DRESSINGS) ×1 IMPLANT
ELECT PENCIL ROCKER SW 15FT (MISCELLANEOUS) ×4 IMPLANT
ELECT REM PT RETURN 15FT ADLT (MISCELLANEOUS) ×2 IMPLANT
ENDOLOOP SUT PDS II  0 18 (SUTURE)
ENDOLOOP SUT PDS II 0 18 (SUTURE) IMPLANT
EVACUATOR SILICONE 100CC (DRAIN) IMPLANT
GAUZE SPONGE 4X4 12PLY STRL (GAUZE/BANDAGES/DRESSINGS) IMPLANT
GLOVE BIO SURGEON STRL SZ 6.5 (GLOVE) ×11 IMPLANT
GLOVE BIOGEL PI IND STRL 7.0 (GLOVE) ×3 IMPLANT
GLOVE BIOGEL PI INDICATOR 7.0 (GLOVE) ×12
GOWN STRL REUS W/TWL 2XL LVL3 (GOWN DISPOSABLE) ×8 IMPLANT
GOWN STRL REUS W/TWL XL LVL3 (GOWN DISPOSABLE) ×8 IMPLANT
HOLDER FOLEY CATH W/STRAP (MISCELLANEOUS) ×2 IMPLANT
IRRIG SUCT STRYKERFLOW 2 WTIP (MISCELLANEOUS) ×2
IRRIGATION SUCT STRKRFLW 2 WTP (MISCELLANEOUS) ×1 IMPLANT
KIT PROCEDURE DA VINCI SI (MISCELLANEOUS) ×1
KIT PROCEDURE DVNC SI (MISCELLANEOUS) ×1 IMPLANT
LEGGING LITHOTOMY PAIR STRL (DRAPES) ×2 IMPLANT
NDL INSUFFLATION 14GA 120MM (NEEDLE) ×1 IMPLANT
NEEDLE INSUFFLATION 14GA 120MM (NEEDLE) ×2 IMPLANT
PACK CARDIOVASCULAR III (CUSTOM PROCEDURE TRAY) ×2 IMPLANT
PACK COLON (CUSTOM PROCEDURE TRAY) ×2 IMPLANT
PORT LAP GEL ALEXIS MED 5-9CM (MISCELLANEOUS) ×1 IMPLANT
RELOAD STAPLE 45 BLU REG DVNC (STAPLE) IMPLANT
RELOAD STAPLE 45 GRN THCK DVNC (STAPLE) IMPLANT
RETRACTOR WND ALEXIS 18 MED (MISCELLANEOUS) IMPLANT
RTRCTR WOUND ALEXIS 18CM MED (MISCELLANEOUS)
SCISSORS LAP 5X35 DISP (ENDOMECHANICALS) ×2 IMPLANT
SEAL CANN UNIV 5-8 DVNC XI (MISCELLANEOUS) ×3 IMPLANT
SEAL XI 5MM-8MM UNIVERSAL (MISCELLANEOUS) ×3
SEALER VESSEL DA VINCI XI (MISCELLANEOUS) ×1
SEALER VESSEL EXT DVNC XI (MISCELLANEOUS) ×1 IMPLANT
SET BI-LUMEN FLTR TB AIRSEAL (TUBING) ×2 IMPLANT
SLEEVE ADV FIXATION 5X100MM (TROCAR) IMPLANT
SOLUTION ELECTROLUBE (MISCELLANEOUS) ×2 IMPLANT
SPONGE DRAIN TRACH 4X4 STRL 2S (GAUZE/BANDAGES/DRESSINGS) ×1 IMPLANT
STAPLER 45 BLU RELOAD XI (STAPLE) ×2 IMPLANT
STAPLER 45 BLUE RELOAD XI (STAPLE) ×2
STAPLER 45 GREEN RELOAD XI (STAPLE)
STAPLER 45 GRN RELOAD XI (STAPLE) IMPLANT
STAPLER CANNULA SEAL DVNC XI (STAPLE) ×1 IMPLANT
STAPLER CANNULA SEAL XI (STAPLE) ×1
STAPLER CIRC CVD 29MM 37CM (STAPLE) ×1 IMPLANT
STAPLER SHEATH (SHEATH) ×1
STAPLER SHEATH ENDOWRIST DVNC (SHEATH) ×1 IMPLANT
STAPLER VISISTAT 35W (STAPLE) ×2 IMPLANT
SUT ETHILON 2 0 PS N (SUTURE) IMPLANT
SUT NOVA NAB GS-21 0 18 T12 DT (SUTURE) ×4 IMPLANT
SUT PDS AB 1 CTX 36 (SUTURE) IMPLANT
SUT PDS AB 1 TP1 96 (SUTURE) IMPLANT
SUT PROLENE 2 0 KS (SUTURE) ×2 IMPLANT
SUT SILK 2 0 (SUTURE) ×2
SUT SILK 2 0 SH CR/8 (SUTURE) ×2 IMPLANT
SUT SILK 2-0 18XBRD TIE 12 (SUTURE) ×1 IMPLANT
SUT SILK 3 0 (SUTURE) ×2
SUT SILK 3 0 SH CR/8 (SUTURE) ×2 IMPLANT
SUT SILK 3-0 18XBRD TIE 12 (SUTURE) ×1 IMPLANT
SUT V-LOC BARB 180 2/0GR6 GS22 (SUTURE)
SUT VIC AB 2-0 SH 18 (SUTURE) ×2 IMPLANT
SUT VIC AB 2-0 SH 27 (SUTURE) ×2
SUT VIC AB 2-0 SH 27X BRD (SUTURE) ×1 IMPLANT
SUT VIC AB 3-0 SH 18 (SUTURE) IMPLANT
SUT VIC AB 4-0 PS2 27 (SUTURE) ×4 IMPLANT
SUTURE V-LC BRB 180 2/0GR6GS22 (SUTURE) IMPLANT
SYRINGE 10CC LL (SYRINGE) ×2 IMPLANT
SYS LAPSCP GELPORT 120MM (MISCELLANEOUS)
SYSTEM LAPSCP GELPORT 120MM (MISCELLANEOUS) IMPLANT
TOWEL OR 17X26 10 PK STRL BLUE (TOWEL DISPOSABLE) ×2 IMPLANT
TOWEL OR NON WOVEN STRL DISP B (DISPOSABLE) ×2 IMPLANT
TRAY FOLEY W/METER SILVER 14FR (SET/KITS/TRAYS/PACK) ×2 IMPLANT
TROCAR ADV FIXATION 5X100MM (TROCAR) ×2 IMPLANT
TUBING CONNECTING 10 (TUBING) IMPLANT

## 2016-03-21 NOTE — Transfer of Care (Signed)
Immediate Anesthesia Transfer of Care Note  Patient: Kaitlyn Morgan  Procedure(s) Performed: Procedure(s): XI ROBOTIC ASSISTED LOWER ANTERIOR RESECTION (N/A)  Patient Location: PACU  Anesthesia Type:General  Level of Consciousness: awake and alert   Airway & Oxygen Therapy: Patient Spontanous Breathing and Patient connected to face mask oxygen  Post-op Assessment: Report given to RN and Post -op Vital signs reviewed and stable  Post vital signs: Reviewed and stable  Last Vitals:  Vitals:   03/21/16 0700 03/21/16 1212  BP: 96/69 (P) 110/76  Pulse: 74   Resp: 16 (P) 11  Temp: 36.7 C     Last Pain: There were no vitals filed for this visit.       Complications: No apparent anesthesia complications

## 2016-03-21 NOTE — Progress Notes (Signed)
Received patient from PACU, VS obtained, oriented to unit, call light placed in reach  

## 2016-03-21 NOTE — Progress Notes (Signed)
Called patient because she has not shown for 0600 arrival. States she began vomiting and diarrhea and turned around to go home and change clothes.

## 2016-03-21 NOTE — H&P (View-Only) (Signed)
History of Present Illness Leighton Ruff MD; [id number] 2:46 PM) The patient is a 50 year old female who presents with colorectal cancer. 50 year old female who presents to the office for evaluation of rectal cancer. She is status post chemotherapy and radiation which was completed at the end of June. She was diagnosed with stage III disease via MRI of the pelvis. Her CEA was normal. Her CT abdomen shows some small liver lesions. She is currently having some obstructive symptoms but is having bowel function without much difficulty. She denies any rectal bleeding currently. She underwent a flexible sigmoidoscopy to diagnose this and they were unable to traverse the tumor with the endoscope. It was tattooed distally and was thought to be approximately 15 cm from anal verge. MRI showed the tumor to be approximately 8 cm from the anal verge.   Problem List/Past Medical Leighton Ruff, MD; [id number] 2:46 PM) RECTAL CANCER (C20)  Other Problems Leighton Ruff, MD; [id number] 2:46 PM) Bladder Problems Cancer Rectal Cancer Back Pain  Past Surgical History Leighton Ruff, MD; [id number] 2:46 PM) Hysterectomy (due to cancer) - Partial Cesarean Section - 1  Diagnostic Studies History Leighton Ruff, MD; [id number] 2:46 PM) Colonoscopy within last year Mammogram within last year  Allergies Elbert Ewings, CMA; 03/02/2016 1:56 PM) Ambien *HYPNOTICS/SEDATIVES/SLEEP DISORDER AGENTS* Codeine Sulfate *ANALGESICS - OPIOID* Nausea.  Medication History Leighton Ruff, MD; [id number] 2:46 PM) Medications Reconciled Neomycin Sulfate (500MG  Tablet, 2 (two) Tablet Oral SEE NOTE, Taken starting 03/02/2016) Active. (TAKE TWO TABLETS AT 2 PM, 3 PM, AND 10 PM THE DAY PRIOR TO SURGERY) No Current Medications (Taken starting 03/02/2016) Flagyl (500MG  Tablet, 2 (two) Tablet Oral SEE NOTE, Taken starting 03/02/2016) Active. (Take at 2pm, 3pm, and 10pm the day prior to your colon operation)  Social  History Leighton Ruff, MD; [id number] 2:46 PM) Caffeine use Coffee. No drug use Tobacco use Never smoker. Alcohol use Occasional alcohol use.  Family History Leighton Ruff, MD; [id number] 2:46 PM) Alcohol Abuse Father. Arthritis Sister. Hypertension Father, Sister. Respiratory Condition Father.  Pregnancy / Birth History Leighton Ruff, MD; [id number] 2:46 PM) Age at menarche 66 years. Contraceptive History Intrauterine device. Gravida 4 Irregular periods Maternal age 70-20 Para 3    Vitals Elbert Ewings CMA; 03/02/2016 1:57 PM) 03/02/2016 1:56 PM Weight: 167 lb Height: 62in Body Surface Area: 1.77 m Body Mass Index: 30.54 kg/m  Temp.: 98.45F(Temporal)  Pulse: 89 (Regular)  BP: 130/72 (Sitting, Left Arm, Standard)      Physical Exam Leighton Ruff MD; [id number] 2:44 PM)  The physical exam findings are as follows: Note:General: Overweight female in NAD. Pleasant and cooperative.  HEENT: [place]/AT, no facial masses  EYES: EOMI, no icterus  NECK: Supple, no obvious mass.  CV: RRR, no murmur, no JVD.  CHEST: Breath sounds equal and clear. Respirations nonlabored.  ABDOMEN: Soft, nontender, nondistended, no masses, no organomegaly, no hernias.  ANORECTAL: No fissures. Normal sphincter tone. No masses. No blood on finger.  MUSCULOSKELETAL: FROM, good muscle tone, no edema, no venous stasis changes  LYMPHATIC: No palpable supraclavicular or inguinal adenopathy.  SKIN: No jaundice or suspicious rashes.  NEUROLOGIC: Alert and oriented, answers questions appropriately, normal gait and station.  PSYCHIATRIC: Normal mood, affect , and behavior.    Assessment & Plan Leighton Ruff MD; [id number] 2:39 PM)  RECTAL CANCER (C20) Impression: 50 year old female status post chemotherapy and radiation treatment for a distal sigmoid proximal rectal cancer with almost complete obstruction. She has done well with radiation and is ready  for definitive resection. CT  scans show some small lesions in her liver that are too small to characterize. CEA was normal. Rectal ultrasound showed evidence of enlarged lymph nodes. The distal portion of the tumor was tattooed at approximately 15 cm. We had a long discussion today about rectal cancer and robotic versus open resections. We discussed that there is no obvious difference in outcomes of surgery except for one study. We discussed that our information on this is limited due to the short time period that we have been performing robotic rectal surgery. The surgery and anatomy were described to the patient as well as the risks of surgery and the possible complications. These include: Bleeding, deep abdominal infections and possible wound complications such as hernia and infection, damage to adjacent structures, leak of surgical connections, which can lead to other surgeries and possibly an ostomy, possible need for other procedures, such as abscess drains in radiology, possible prolonged hospital stay, possible diarrhea from removal of part of the colon, possible constipation from narcotics, possible bowel, bladder or sexual dysfunction if having rectal surgery, prolonged fatigue/weakness or appetite loss, possible early recurrence of of disease, possible complications of their medical problems such as heart disease or arrhythmias or lung problems, death (less than 1%). I believe the patient understands and wishes to proceed with the surgery.

## 2016-03-21 NOTE — Anesthesia Procedure Notes (Addendum)
Procedure Name: Intubation Date/Time: 03/21/2016 8:34 AM Performed by: [place] Darnel Mchan C Pre-anesthesia Checklist: Patient identified, Timeout performed, Patient being monitored, Suction available and Emergency Drugs available Patient Re-evaluated:Patient Re-evaluated prior to inductionOxygen Delivery Method: Circle system utilized Preoxygenation: Pre-oxygenation with 100% oxygen Intubation Type: IV induction, Cricoid Pressure applied and Rapid sequence Ventilation: Mask ventilation without difficulty Laryngoscope Size: Mac and 4 Grade View: Grade I Tube type: Oral Tube size: 7.5 mm Number of attempts: 1 Airway Equipment and Method: Stylet and Oral airway Placement Confirmation: ETT inserted through vocal cords under direct vision,  positive ETCO2 and breath sounds checked- equal and bilateral Secured at: 22 cm Tube secured with: Tape Dental Injury: Teeth and Oropharynx as per pre-operative assessment

## 2016-03-21 NOTE — Interval H&P Note (Signed)
History and Physical Interval Note:  03/21/2016 8:03 AM  Kaitlyn Morgan  has presented today for surgery, with the diagnosis of rectal cancer  The various methods of treatment have been discussed with the patient and family. After consideration of risks, benefits and other options for treatment, the patient has consented to  Procedure(s): XI ROBOTIC ASSISTED LOWER ANTERIOR RESECTION (N/A) as a surgical intervention .  The patient's history has been reviewed, patient examined, no change in status, stable for surgery.  I have reviewed the patient's chart and labs.  Questions were answered to the patient's satisfaction.     Rosario Adie, MD  [facility]

## 2016-03-21 NOTE — Anesthesia Postprocedure Evaluation (Signed)
Anesthesia Post Note  Patient: Kaitlyn Morgan  Procedure(s) Performed: Procedure(s) (LRB): XI ROBOTIC ASSISTED LOWER ANTERIOR RESECTION (N/A)  Patient location during evaluation: PACU Anesthesia Type: General Level of consciousness: awake and alert Pain management: pain level controlled Vital Signs Assessment: post-procedure vital signs reviewed and stable Respiratory status: spontaneous breathing, nonlabored ventilation, respiratory function stable and patient connected to nasal cannula oxygen Cardiovascular status: blood pressure returned to baseline and stable Postop Assessment: no signs of nausea or vomiting Anesthetic complications: no    Last Vitals:  Vitals:   03/21/16 1245 03/21/16 1300  BP: 115/63 109/71  Pulse: 64 61  Resp: 13 13  Temp:  36.3 C    Last Pain:  Vitals:   03/21/16 1245  PainSc: Asleep                 Effie Berkshire

## 2016-03-21 NOTE — Op Note (Signed)
03/21/2016  11:45 AM  PATIENT:  Kaitlyn Morgan  50 y.o. female  Patient Care Team: Leandrew Koyanagi, MD as PCP - General (Internal Medicine) Truitt Merle, MD as Consulting Physician (Hematology) Kyung Rudd, MD as Consulting Physician (Radiation Oncology) Jackolyn Confer, MD as Consulting Physician (General Surgery) Richmond Campbell, MD as Consulting Physician (Gastroenterology) Milus Banister, MD as Attending Physician (Gastroenterology) Tania Ade, RN as Registered Nurse  PRE-OPERATIVE DIAGNOSIS:  rectal cancer  POST-OPERATIVE DIAGNOSIS:  rectal cancer  PROCEDURE:  XI ROBOTIC LOWER ANTERIOR RESECTION   Surgeon(s): Leighton Ruff, MD Stark Klein, MD  ASSISTANT: Dr Barry Dienes   ANESTHESIA:   local and general  EBL: 165ml  Total I/O In: 1000 [I.V.:1000] Out: 200 [Urine:100; Blood:100]  Delay start of Pharmacological VTE agent (>24hrs) due to surgical blood loss or risk of bleeding:  no  DRAINS: (62F) Jackson-Pratt drain(s) with closed bulb suction in the pelvis   SPECIMEN:  Source of Specimen:  Rectosigmoid  DISPOSITION OF SPECIMEN:  PATHOLOGY  COUNTS:  YES  PLAN OF CARE: Admit to inpatient   PATIENT DISPOSITION:  PACU - hemodynamically stable.  INDICATION:    50 y.o. F with rectal cancer s/p neoadjuvant ChemoRT.  I recommended segmental resection:  The anatomy & physiology of the digestive tract was discussed.  The pathophysiology was discussed.  Natural history risks without surgery was discussed.   I worked to give an overview of the disease and the frequent need to have multispecialty involvement.  I feel the risks of no intervention will lead to serious problems that outweigh the operative risks; therefore, I recommended a partial colectomy to remove the pathology.  Laparoscopic & open techniques were discussed.   Risks such as bleeding, infection, abscess, leak, reoperation, possible ostomy, hernia, heart attack, death, and other risks were discussed.  I noted a  good likelihood this will help address the problem.   Goals of post-operative recovery were discussed as well.    The patient expressed understanding & wished to proceed with surgery.  OR FINDINGS:   Patient had mass in her proximal rectum with distal tattoo  No obvious metastatic disease on visceral parietal peritoneum or liver.  The anastomosis rests 11 cm from the anal verge by rigid proctoscopy.  DESCRIPTION:   Informed consent was confirmed.  The patient underwent general anaesthesia without difficulty.  The patient was positioned appropriately.  VTE prevention in place.  The patient's abdomen was clipped, prepped, & draped in a sterile fashion.  Surgical timeout confirmed our plan.  The patient was positioned in reverse Trendelenburg.  Abdominal entry was gained using a Varies needle.  Entry was clean.  I induced carbon dioxide insufflation.  Camera inspection revealed no injury.  Extra ports were carefully placed under direct laparoscopic visualization.   I reflected the greater omentum and the upper abdomen the small bowel in the upper abdomen. The robot was then docked to the patient's left side.  Instruments were introduced under direct visualization.  I scored the base of peritoneum of the right side of the mesentery of the left colon from the ligament of Treitz to the peritoneal reflection of the mid rectum.  I elevated the sigmoid mesentery and enetered into the retro-mesenteric plane. We were able to identify the left ureter and gonadal vessels. We kept those posterior within the retroperitoneum and elevated the left colon mesentery off that. I did isolated IMA pedicle but did not ligate it yet.  I continued distally and got into the avascular plane posterior  to the mesorectum. This allowed me to help mobilize the rectum as well by freeing the mesorectum off the sacrum.  I mobilized the peritoneal coverings around the peritoneal reflection on both the right and left sides of the  rectum.  I could see the right and left ureters and stayed away from them.  I bluntly dissected the mesenteric plane posteriorly and laterally, making sure to preserve the hypogastric nerves posteriorly.  I then dissected out the anterior plan to the level of just distal to the tattoo markings below the peritoneal reflection.   I skeletonized the inferior mesenteric artery pedicle.  I went down to its takeoff from the aorta. After confirming the left ureter was out of the way, I went ahead and ligated the inferior mesenteric artery pedicle with bipolar robotic vessel sealer ~2cm above its takeoff from the aorta.  We ensured hemostasis. I skeletonized the mesorectum at the junction at the proximal rectum using blunt dissection & bipolar robotic vessel sealer.  I mobilized the left colon in a lateral to medial fashion off the line of Toldt up towards the splenic flexure to ensure good mobilization of the left colon to reach into the pelvis.  There was a small potential serosal defect in the colon that was closed using 2, 3-0 vicryl interrupted sutures.    I then divided the colonic mesentery up to the junction of the descending colon and sigmoid.  The skeletonized rectum was then divided with 2 blue load robotic stapler loads.  I placed a clamp on the resected staple line and removed the 34mm robotic port.  This was enlarged to become the extraction site in the RLQ.  An alexis wound protector was placed and the colon was removed.  I identified a place just proximal to the area that was divided in the mesentery proximally and divided the remaining mesentery, tieing off the vessels with 2-0 silk sutures.  I placed a 2-0 prolene pursestring and divided the proximal colon with cautery.  I tied the purse string around a 45mm EEA anvil and placed this back into the abdomen.  An anastomosis was created without tension using the EEA stapler under laparoscopic visualization.  There was no leak when insufflated under water.   Hemostasis was good.  The anastomosis rests ~11cm from the anal verge.  A 35F blake drain was placed in the pelvis and brought out and secured to the skin at the RLQ port site.  All instruments, gowns, gloves and drapes were replaced with clean tools.    The extraction site fascia was closed with 0 Novofil interrupted sutures.  The subcutaneous tissue was closed with 2-0 Vicryl sutures.  The skin was closed with a running 4-0 Vicryl suture.  The remaining port sites were closed with the 4-0 Vicryl.  Dermabond and dressings were applied.  All counts were correct per OR staff.  The patient was awakened from anesthesia and sent to the PACU in stable condition.

## 2016-03-22 LAB — BASIC METABOLIC PANEL
Anion gap: 6 (ref 5–15)
BUN: 7 mg/dL (ref 6–20)
CO2: 26 mmol/L (ref 22–32)
Calcium: 8.8 mg/dL — ABNORMAL LOW (ref 8.9–10.3)
Chloride: 109 mmol/L (ref 101–111)
Creatinine, Ser: 0.78 mg/dL (ref 0.44–1.00)
GFR calc Af Amer: >60 mL/min (ref >60)
GFR calc non Af Amer: >60 mL/min (ref >60)
Glucose, Bld: 132 mg/dL — ABNORMAL HIGH (ref 65–99)
Potassium: 4 mmol/L (ref 3.5–5.1)
Sodium: 141 mmol/L (ref 135–145)

## 2016-03-22 LAB — CBC
HCT: 30.8 % — ABNORMAL LOW (ref 36.0–46.0)
Hemoglobin: 10.6 g/dL — ABNORMAL LOW (ref 12.0–15.0)
MCH: 31.2 pg (ref 26.0–34.0)
MCHC: 34.4 g/dL (ref 30.0–36.0)
MCV: 90.6 fL (ref 78.0–100.0)
Platelets: 202 10*3/uL (ref 150–400)
RBC: 3.4 MIL/uL — ABNORMAL LOW (ref 3.87–5.11)
RDW: 14.1 % (ref 11.5–15.5)
WBC: 6.7 10*3/uL (ref 4.0–10.5)

## 2016-03-22 MED ORDER — ACETAMINOPHEN 325 MG PO TABS
650.0000 mg | ORAL_TABLET | ORAL | Status: DC | PRN
  Administered 2016-03-22 – 2016-03-25 (×4): 650 mg via ORAL
  Filled 2016-03-22 (×4): qty 2

## 2016-03-22 NOTE — Progress Notes (Signed)
1 Day Post-Op Robotic LAR Subjective: DOing well.  No nausea  Objective: Vital signs in last 24 hours: Temp:  [97.4 F (36.3 C)-98.2 F (36.8 C)] 97.9 F (36.6 C) (08/08 0533) Pulse Rate:  [58-76] 60 (08/08 0533) Resp:  [11-16] 16 (08/08 0533) BP: (85-119)/(51-76) 92/60 (08/08 0533) SpO2:  [100 %] 100 % (08/08 0533) Weight:  [76.2 kg (168 lb)] 76.2 kg (168 lb) (08/07 1425)   Intake/Output from previous day: 08/07 0701 - 08/08 0700 In: 3725 [P.O.:180; I.V.:3545] Out: 2605 [Urine:2275; Emesis/NG output:20; Drains:210; Blood:100] Intake/Output this shift: No intake/output data recorded.   General appearance: alert and cooperative GI: soft, appropriately tender JP: SS fluid Incision: no significant drainage  Lab Results:   Recent Labs  03/22/16 0523  WBC 6.7  HGB 10.6*  HCT 30.8*  PLT 202   BMET  Recent Labs  03/22/16 0523  NA 141  K 4.0  CL 109  CO2 26  GLUCOSE 132*  BUN 7  CREATININE 0.78  CALCIUM 8.8*   PT/INR No results for input(s): LABPROT, INR in the last 72 hours. ABG No results for input(s): PHART, HCO3 in the last 72 hours.  Invalid input(s): PCO2, PO2  MEDS, Scheduled . acetaminophen  1,000 mg Oral Q6H  . alvimopan  12 mg Oral BID  . enoxaparin (LOVENOX) injection  40 mg Subcutaneous Q24H  . estradiol  2 mg Oral Daily    Studies/Results: No results found.  Assessment: s/p Procedure(s): XI ROBOTIC ASSISTED LOWER ANTERIOR RESECTION Patient Active Problem List   Diagnosis Date Noted  . Genetic testing 02/19/2016  . Family history of breast cancer   . Family history of uterine cancer   . Rectal cancer ([place]) 12/18/2015    Expected post op course  Plan: Advance diet to clears Ambulate Foley out in AM   LOS: 1 day     .Rosario Adie, MD [facility], [place]   03/22/2016 8:17 AM

## 2016-03-23 ENCOUNTER — Encounter: Payer: Self-pay | Admitting: *Deleted

## 2016-03-23 LAB — BASIC METABOLIC PANEL
Anion gap: 2 — ABNORMAL LOW (ref 5–15)
BUN: 6 mg/dL (ref 6–20)
CO2: 27 mmol/L (ref 22–32)
Calcium: 8.2 mg/dL — ABNORMAL LOW (ref 8.9–10.3)
Chloride: 109 mmol/L (ref 101–111)
Creatinine, Ser: 0.7 mg/dL (ref 0.44–1.00)
GFR calc Af Amer: >60 mL/min (ref >60)
GFR calc non Af Amer: >60 mL/min (ref >60)
Glucose, Bld: 95 mg/dL (ref 65–99)
Potassium: 4 mmol/L (ref 3.5–5.1)
Sodium: 138 mmol/L (ref 135–145)

## 2016-03-23 LAB — CBC
HCT: 29.8 % — ABNORMAL LOW (ref 36.0–46.0)
Hemoglobin: 10.1 g/dL — ABNORMAL LOW (ref 12.0–15.0)
MCH: 31.5 pg (ref 26.0–34.0)
MCHC: 33.9 g/dL (ref 30.0–36.0)
MCV: 92.8 fL (ref 78.0–100.0)
Platelets: 176 10*3/uL (ref 150–400)
RBC: 3.21 MIL/uL — ABNORMAL LOW (ref 3.87–5.11)
RDW: 14.6 % (ref 11.5–15.5)
WBC: 4.1 10*3/uL (ref 4.0–10.5)

## 2016-03-23 MED ORDER — BOOST / RESOURCE BREEZE PO LIQD
1.0000 | Freq: Three times a day (TID) | ORAL | Status: DC
  Administered 2016-03-23 – 2016-03-26 (×4): 1 via ORAL

## 2016-03-23 MED ORDER — KETOROLAC TROMETHAMINE 15 MG/ML IJ SOLN
30.0000 mg | Freq: Three times a day (TID) | INTRAMUSCULAR | Status: AC
  Administered 2016-03-23 – 2016-03-25 (×6): 30 mg via INTRAVENOUS
  Filled 2016-03-23 (×6): qty 2

## 2016-03-23 NOTE — Progress Notes (Signed)
2 Days Post-Op Robotic LAR Subjective:   No nausea.  Having some crampy abd pain today.  No significant bowel function yet  Objective: Vital signs in last 24 hours: Temp:  [97.9 F (36.6 C)-98.9 F (37.2 C)] 98.2 F (36.8 C) (08/09 0622) Pulse Rate:  [51-72] 51 (08/09 0622) Resp:  [16] 16 (08/09 0622) BP: (89-98)/(59-66) 98/66 (08/09 0622) SpO2:  [98 %-100 %] 98 % (08/09 0622)   Intake/Output from previous day: 08/08 0701 - 08/09 0700 In: 2185 [P.O.:360; I.V.:1825] Out: 1170 [Urine:1100; Drains:70] Intake/Output this shift: No intake/output data recorded.   General appearance: alert and cooperative GI: soft, appropriately tender JP: SS fluid Incision: no significant drainage  Lab Results:   Recent Labs  03/22/16 0523 03/23/16 0450  WBC 6.7 4.1  HGB 10.6* 10.1*  HCT 30.8* 29.8*  PLT 202 176   BMET  Recent Labs  03/22/16 0523 03/23/16 0450  NA 141 138  K 4.0 4.0  CL 109 109  CO2 26 27  GLUCOSE 132* 95  BUN 7 6  CREATININE 0.78 0.70  CALCIUM 8.8* 8.2*   PT/INR No results for input(s): LABPROT, INR in the last 72 hours. ABG No results for input(s): PHART, HCO3 in the last 72 hours.  Invalid input(s): PCO2, PO2  MEDS, Scheduled . alvimopan  12 mg Oral BID  . enoxaparin (LOVENOX) injection  40 mg Subcutaneous Q24H  . estradiol  2 mg Oral Daily  . ketorolac  30 mg Intravenous Q8H    Studies/Results: No results found.  Assessment: s/p Procedure(s): XI ROBOTIC ASSISTED LOWER ANTERIOR RESECTION Patient Active Problem List   Diagnosis Date Noted  . Genetic testing 02/19/2016  . Family history of breast cancer   . Family history of uterine cancer   . Rectal cancer ([place]) 12/18/2015    Expected post op course  Plan: Cont clears Ambulate Foley out today Add toradol to help with pain   LOS: 2 days     .Rosario Adie, [facility], [place]   03/23/2016 8:58 AM

## 2016-03-23 NOTE — Progress Notes (Signed)
Oncology Nurse Navigator Documentation  Oncology Nurse Navigator Flowsheets 03/23/2016  Navigator Location [facility]  Navigator Encounter Type Other--Hospital visit (postop)  Telephone -  Abnormal Finding Date -  Confirmed Diagnosis Date -  Surgery Date 03/21/2016  Treatment Initiated Date -  Patient Visit Type Inpatient  Treatment Phase -  Barriers/Navigation Needs No barriers at this time;No Questions;No Needs  Education -Inquired when her path report will be available-informed her it can take 3-4 days for results  Interventions -Encouraged her to continue to ambulate as much as possible and follow up with Dr. Burr Medico as scheduled.  Referrals -  Coordination of Care -  Support Groups/Services -  Acuity -  Time Spent with Patient 15  Met with patient and her daughter as she was walking in the hall this afternoon. Reports having significant gas and walking helps. Pain is controlled. Hoping to advance her diet tomorrow. Made her aware we were pleased for her that she did not need an ostomy and that we were thinking of her and wishing speedy recovery.

## 2016-03-23 NOTE — Progress Notes (Signed)
Initial Nutrition Assessment  DOCUMENTATION CODES:   Obesity unspecified  INTERVENTION:   -Provide Boost Breeze po TID, each supplement provides 250 kcal and 9 grams of protein -Reviewed soft diet with patient -Diet advancement per surgery -RD to continue to monitor  NUTRITION DIAGNOSIS:   Increased nutrient needs related to cancer and cancer related treatments as evidenced by estimated needs.  GOAL:   Patient will meet greater than or equal to 90% of their needs  MONITOR:   PO intake, Supplement acceptance, Labs, Weight trends, I & O's  REASON FOR ASSESSMENT:   Malnutrition Screening Tool    ASSESSMENT:   50 year old female who presents with colorectal cancer. 50 year old female who presents to the office for evaluation of rectal cancer.  She is status post chemotherapy and radiation which was completed at the end of June.  She was diagnosed with stage III disease via MRI of the pelvis.  Her CEA was normal.  Her CT abdomen shows some small liver lesions.  She is currently having some obstructive symptoms but is having bowel function without much difficulty.   8/7: S/p XI robotic assisted lower anterior resection   Patient in room with family at bedside, she was brushing her teeth. Pt recently completed radiation and chemotherapy for rectal cancer in June 2017. Pt reports no issues with swallowing, chewing or taste. She is consuming most of her clear liquid trays with no issues. She does report some pain but no nausea. Pt is agreeable to trying Boost Breeze while on clears. She states she is feeling apprehensive about eating solid foods again and worried they will cause pain. Pt encouraged to slowly advance her diet. May being full liquid diet tomorrow. Pt states milk/dairy foods have cause GI upset in the past for her. Explained to pt there are lactose-free options available on full liquid diet. Pt is allergic to sucralose. Briefly reviewed low fiber diet with patient.  Per  patient UBW is 165 lb. Weight remains stable. She states she gained ~20 lb during cancer treatment. Nutrition focused physical exam shows no sign of depletion of muscle mass or body fat.  Labs reviewed. Medications: D5 and .45% NaCl w/ KCl infusion @ 50 ml/hr -provides 204 kcal  Diet Order:  Diet clear liquid Room service appropriate? Yes; Fluid consistency: Thin  Skin:  Wound (see comment) (8/7 abdominal incision)  Last BM:  8/6  Height:   Ht Readings from Last 1 Encounters:  03/21/16 5\' 2"  (1.575 m)    Weight:   Wt Readings from Last 1 Encounters:  03/21/16 168 lb (76.2 kg)    Ideal Body Weight:  50 kg  BMI:  Body mass index is 30.73 kg/m.  Estimated Nutritional Needs:   Kcal:  1600-1800  Protein:  70-80g  Fluid:  1.8L/day  EDUCATION NEEDS:   Education needs addressed  Clayton Bibles, MS, RD, LDN Pager: [phone] After Hours Pager: [phone]

## 2016-03-24 LAB — CBC
HCT: 27.1 % — ABNORMAL LOW (ref 36.0–46.0)
Hemoglobin: 9.1 g/dL — ABNORMAL LOW (ref 12.0–15.0)
MCH: 31.2 pg (ref 26.0–34.0)
MCHC: 33.6 g/dL (ref 30.0–36.0)
MCV: 92.8 fL (ref 78.0–100.0)
Platelets: 146 10*3/uL — ABNORMAL LOW (ref 150–400)
RBC: 2.92 MIL/uL — ABNORMAL LOW (ref 3.87–5.11)
RDW: 14.5 % (ref 11.5–15.5)
WBC: 3.5 10*3/uL — ABNORMAL LOW (ref 4.0–10.5)

## 2016-03-24 LAB — BASIC METABOLIC PANEL
Anion gap: 5 (ref 5–15)
BUN: <5 mg/dL — ABNORMAL LOW (ref 6–20)
CO2: 26 mmol/L (ref 22–32)
Calcium: 8.1 mg/dL — ABNORMAL LOW (ref 8.9–10.3)
Chloride: 109 mmol/L (ref 101–111)
Creatinine, Ser: 0.7 mg/dL (ref 0.44–1.00)
GFR calc Af Amer: >60 mL/min (ref >60)
GFR calc non Af Amer: >60 mL/min (ref >60)
Glucose, Bld: 88 mg/dL (ref 65–99)
Potassium: 3.9 mmol/L (ref 3.5–5.1)
Sodium: 140 mmol/L (ref 135–145)

## 2016-03-24 MED ORDER — TRAMADOL HCL 50 MG PO TABS: 50.0000 mg | ORAL_TABLET | Freq: Four times a day (QID) | ORAL | Status: DC | PRN

## 2016-03-24 NOTE — Progress Notes (Signed)
3 Days Post-Op Robotic LAR Subjective:   No nausea.  Having some flatus.  Objective: Vital signs in last 24 hours: Temp:  [97.7 F (36.5 C)-98.6 F (37 C)] 97.7 F (36.5 C) (08/10 0558) Pulse Rate:  [72-74] 72 (08/10 0558) Resp:  [14-16] 14 (08/10 0558) BP: (85-98)/(50-62) 98/58 (08/10 0624) SpO2:  [98 %-99 %] 99 % (08/10 0558)   Intake/Output from previous day: 08/09 0701 - 08/10 0700 In: 1189.2 [I.V.:1189.2] Out: 2115 [Urine:1950; Drains:115; Stool:50] Intake/Output this shift: No intake/output data recorded.   General appearance: alert and cooperative GI: soft, appropriately tender JP: SS fluid Incision: no significant drainage  Lab Results:   Recent Labs  03/23/16 0450 03/24/16 0427  WBC 4.1 3.5*  HGB 10.1* 9.1*  HCT 29.8* 27.1*  PLT 176 146*   BMET  Recent Labs  03/23/16 0450 03/24/16 0427  NA 138 140  K 4.0 3.9  CL 109 109  CO2 27 26  GLUCOSE 95 88  BUN 6 <5*  CREATININE 0.70 0.70  CALCIUM 8.2* 8.1*   PT/INR No results for input(s): LABPROT, INR in the last 72 hours. ABG No results for input(s): PHART, HCO3 in the last 72 hours.  Invalid input(s): PCO2, PO2  MEDS, Scheduled . alvimopan  12 mg Oral BID  . enoxaparin (LOVENOX) injection  40 mg Subcutaneous Q24H  . estradiol  2 mg Oral Daily  . feeding supplement  1 Container Oral TID BM  . ketorolac  30 mg Intravenous Q8H    Studies/Results: No results found.  Assessment: s/p Procedure(s): XI ROBOTIC ASSISTED LOWER ANTERIOR RESECTION Patient Active Problem List   Diagnosis Date Noted  . Genetic testing 02/19/2016  . Family history of breast cancer   . Family history of uterine cancer   . Rectal cancer ([place]) 12/18/2015    Expected post op course  Plan: Advance diet as tolerated Try PO pain meds once eating more Ambulate Cont JP Cont toradol to help with pain   LOS: 3 days     .Rosario Adie, [facility], [place] [phone]   03/24/2016 8:28  AM

## 2016-03-25 LAB — HEMOGLOBIN AND HEMATOCRIT, BLOOD
HCT: 27.8 % — ABNORMAL LOW (ref 36.0–46.0)
Hemoglobin: 9.5 g/dL — ABNORMAL LOW (ref 12.0–15.0)

## 2016-03-25 NOTE — Progress Notes (Signed)
4 Days Post-Op Robotic LAR Subjective:   No nausea.  Having some flatus.  Some stool  Objective: Vital signs in last 24 hours: Temp:  [98.2 F (36.8 C)-98.9 F (37.2 C)] 98.9 F (37.2 C) (08/11 0612) Pulse Rate:  [74-83] 77 (08/11 0612) Resp:  [14-18] 16 (08/11 0612) BP: (82-109)/(47-73) 109/73 (08/11 0612) SpO2:  [99 %-100 %] 99 % (08/11 0523)   Intake/Output from previous day: 08/10 0701 - 08/11 0700 In: 362.2 [I.V.:362.2] Out: 1185 [Urine:1100; Drains:85] Intake/Output this shift: No intake/output data recorded.   General appearance: alert and cooperative GI: soft, appropriately tender JP: dark fluid Incision: no significant drainage  Lab Results:   Recent Labs  03/23/16 0450 03/24/16 0427 03/25/16 0436  WBC 4.1 3.5*  --   HGB 10.1* 9.1* 9.5*  HCT 29.8* 27.1* 27.8*  PLT 176 146*  --    BMET  Recent Labs  03/23/16 0450 03/24/16 0427  NA 138 140  K 4.0 3.9  CL 109 109  CO2 27 26  GLUCOSE 95 88  BUN 6 <5*  CREATININE 0.70 0.70  CALCIUM 8.2* 8.1*   PT/INR No results for input(s): LABPROT, INR in the last 72 hours. ABG No results for input(s): PHART, HCO3 in the last 72 hours.  Invalid input(s): PCO2, PO2  MEDS, Scheduled . alvimopan  12 mg Oral BID  . enoxaparin (LOVENOX) injection  40 mg Subcutaneous Q24H  . estradiol  2 mg Oral Daily  . feeding supplement  1 Container Oral TID BM    Studies/Results: No results found.  Assessment: s/p Procedure(s): XI ROBOTIC ASSISTED LOWER ANTERIOR RESECTION Patient Active Problem List   Diagnosis Date Noted  . Genetic testing 02/19/2016  . Family history of breast cancer   . Family history of uterine cancer   . Rectal cancer ([place]) 12/18/2015    Expected post op course  Plan: Cont soft diet Try PO pain meds today Ambulate Cont JP until drainage clears   LOS: 4 days     .Rosario Adie, [facility], [place]   03/25/2016 9:03 AM

## 2016-03-25 NOTE — Discharge Instructions (Addendum)
ABDOMINAL SURGERY: POST OP INSTRUCTIONS  1. DIET: Follow a light bland diet the first 24 hours after arrival home, such as soup, liquids, crackers, etc.  Be sure to include lots of fluids daily.  Avoid fast food or heavy meals as your are more likely to get nauseated.  Do not eat any uncooked fruits or vegetables for the next 2 weeks as your colon heals. 2. Take your usually prescribed home medications unless otherwise directed. 3. PAIN CONTROL: a. Pain is best controlled by a usual combination of three different methods TOGETHER: i. Ice/Heat ii. Over the counter pain medication iii. Prescription pain medication b. Most patients will experience some swelling and bruising around the incisions.  Ice packs or heating pads (30-60 minutes up to 6 times a day) will help. Use ice for the first few days to help decrease swelling and bruising, then switch to heat to help relax tight/sore spots and speed recovery.  Some people prefer to use ice alone, heat alone, alternating between ice & heat.  Experiment to what works for you.  Swelling and bruising can take several weeks to resolve.   c. It is helpful to take an over-the-counter pain medication regularly for the first few weeks.  Choose one of the following that works best for you: i. Naproxen (Aleve, etc)  Two 220mg  tabs twice a day ii. Ibuprofen (Advil, etc) Three 200mg  tabs four times a day (every meal & bedtime) iii. Acetaminophen (Tylenol, etc) 500-650mg  four times a day (every meal & bedtime) d. A  prescription for pain medication (such as oxycodone, hydrocodone, etc) should be given to you upon discharge.  Take your pain medication as prescribed.  i. If you are having problems/concerns with the prescription medicine (does not control pain, nausea, vomiting, rash, itching, etc), please call us [phone] to see if we need to switch you to a different pain medicine that will work better for you and/or control your side effect better. ii. If you  need a refill on your pain medication, please contact your pharmacy.  They will contact our office to request authorization. Prescriptions will not be filled after 5 pm or on week-ends. 4. Avoid getting constipated.  Between the surgery and the pain medications, it is common to experience some constipation.  Increasing fluid intake and taking a fiber supplement (such as Metamucil, Citrucel, FiberCon, MiraLax, etc) 1-2 times a day regularly will usually help prevent this problem from occurring.  A mild laxative (prune juice, Milk of Magnesia, MiraLax, etc) should be taken according to package directions if there are no bowel movements after 48 hours.   5. Watch out for diarrhea.  If you have many loose bowel movements, simplify your diet to bland foods & liquids for a few days.  Stop any stool softeners and decrease your fiber supplement.  Switching to mild anti-diarrheal medications (Kayopectate, Pepto Bismol) can help.  If this worsens or does not improve, please call us. 6. Wash / shower every day.  You may shower over the incision / wound.  Avoid baths until the skin is fully healed.  Place cling wrap over drain site before each shower then remove it after the shower. 7. Change drain site dressing every 3 days.  Empty drain and record output twice a day. 8. ACTIVITIES as tolerated:   a. You may resume regular (light) daily activities beginning the next day--such as daily self-care, walking, climbing stairs--gradually increasing activities as tolerated.  If you can walk 30 minutes without difficulty, it  is safe to try more intense activity such as jogging, treadmill, bicycling, low-impact aerobics, swimming, etc. b. Save the most intensive and strenuous activity for last such as sit-ups, heavy lifting, contact sports, etc  Refrain from any heavy lifting or straining until you are off narcotics for pain control.   c. DO NOT PUSH THROUGH PAIN.  Let pain be your guide: If it hurts to do something, don't do  it.  Pain is your body warning you to avoid that activity for another week until the pain goes down. d. You may drive when you are no longer taking prescription pain medication, you can comfortably wear a seatbelt, and you can safely maneuver your car and apply brakes. e. Dennis Bast may have sexual intercourse when it is comfortable.  9. FOLLOW UP in our office a. Please call [facility] at [phone] to set up an appointment to see your surgeon in the office for a follow-up appointment the week of 8/22. b. Make sure that you call for this appointment the day you arrive home to insure a convenient appointment time. 10. IF YOU HAVE DISABILITY OR FAMILY LEAVE FORMS, BRING THEM TO THE OFFICE FOR PROCESSING.  DO NOT GIVE THEM TO YOUR DOCTOR.   WHEN TO CALL us [phone]: 1. Poor pain control 2. Reactions / problems with new medications (rash/itching, nausea, etc)  3. Fever over 101.5 F (38.5 C) 4. Inability to urinate 5. Nausea and/or vomiting 6. Worsening swelling or bruising 7. Continued bleeding from incision. 8. Increased pain, redness, or drainage from the incision  The clinic staff is available to answer your questions during regular business hours (8:30am-5pm).  Please dont hesitate to call and ask to speak to one of our nurses for clinical concerns.   A surgeon from [facility] is always on call at the hospitals   If you have a medical emergency, go to the nearest emergency room or call 911.    [facility], [place], [place], [place], [place]  [address] ? MAIN: [phone] ? TOLL FREE: [phone] ? FAX [phone] [email or web address]

## 2016-03-26 LAB — CBC
HCT: 27.9 % — ABNORMAL LOW (ref 36.0–46.0)
Hemoglobin: 9.6 g/dL — ABNORMAL LOW (ref 12.0–15.0)
MCH: 31.6 pg (ref 26.0–34.0)
MCHC: 34.4 g/dL (ref 30.0–36.0)
MCV: 91.8 fL (ref 78.0–100.0)
Platelets: 183 10*3/uL (ref 150–400)
RBC: 3.04 MIL/uL — ABNORMAL LOW (ref 3.87–5.11)
RDW: 14.3 % (ref 11.5–15.5)
WBC: 4.7 10*3/uL (ref 4.0–10.5)

## 2016-03-26 LAB — BASIC METABOLIC PANEL
Anion gap: 5 (ref 5–15)
BUN: 6 mg/dL (ref 6–20)
CO2: 29 mmol/L (ref 22–32)
Calcium: 8.3 mg/dL — ABNORMAL LOW (ref 8.9–10.3)
Chloride: 105 mmol/L (ref 101–111)
Creatinine, Ser: 0.63 mg/dL (ref 0.44–1.00)
GFR calc Af Amer: >60 mL/min (ref >60)
GFR calc non Af Amer: >60 mL/min (ref >60)
Glucose, Bld: 86 mg/dL (ref 65–99)
Potassium: 4.1 mmol/L (ref 3.5–5.1)
Sodium: 139 mmol/L (ref 135–145)

## 2016-03-26 MED ORDER — TRAMADOL HCL 50 MG PO TABS: 50.0000 mg | ORAL_TABLET | Freq: Four times a day (QID) | ORAL | 0 refills | Status: DC | PRN

## 2016-03-26 NOTE — Progress Notes (Signed)
Assessment unchanged. Pt verbalized understanding of dc instructions via teach back including when to call the doctor and follow up care. Drain care taught and educational literature along with JP record sheet provided. Pt  demonstrated understanding of emptying and charging JP drain as well as verbalizing when dressing needs to be changed per MD order. Script x 1 given as provided by MD. Discharged via wc to front entrance accompanied by NT and daughter.

## 2016-03-26 NOTE — Progress Notes (Signed)
5 Days Post-Op  Subjective: Reports having very small BMs and passing some gas.  Tolerating diet.  Ambulating.  Not needing narcotics for pain control.  Objective: Vital signs in last 24 hours: Temp:  [98.4 F (36.9 C)-99 F (37.2 C)] 99 F (37.2 C) (08/12 0600) Pulse Rate:  [81-95] 81 (08/12 0600) Resp:  [16-20] 20 (08/12 0600) BP: (97-99)/(50-56) 97/56 (08/12 0600) SpO2:  [97 %-100 %] 97 % (08/12 0600) Last BM Date: 03/25/16 (scant)  Intake/Output from previous day: 08/11 0701 - 08/12 0700 In: 950 [P.O.:720; I.V.:230] Out: 21 [Urine:1; Drains:20] Intake/Output this shift: No intake/output data recorded.  PE: General- In NAD Abdomen-soft, incision clean and intact, drain output serous this morning.  Lab Results:   Recent Labs  03/24/16 0427 03/25/16 0436 03/26/16 0511  WBC 3.5*  --  4.7  HGB 9.1* 9.5* 9.6*  HCT 27.1* 27.8* 27.9*  PLT 146*  --  183   BMET  Recent Labs  03/24/16 0427 03/26/16 0511  NA 140 139  K 3.9 4.1  CL 109 105  CO2 26 29  GLUCOSE 88 86  BUN <5* 6  CREATININE 0.70 0.63  CALCIUM 8.1* 8.3*   PT/INR No results for input(s): LABPROT, INR in the last 72 hours. Comprehensive Metabolic Panel:    Component Value Date/Time   NA 139 03/26/2016 0511   NA 140 03/24/2016 0427   NA 142 03/07/2016 1517   NA 144 02/22/2016 1615   K 4.1 03/26/2016 0511   K 3.9 03/24/2016 0427   K 3.6 03/07/2016 1517   K 3.6 02/22/2016 1615   CL 105 03/26/2016 0511   CL 109 03/24/2016 0427   CO2 29 03/26/2016 0511   CO2 26 03/24/2016 0427   CO2 27 03/07/2016 1517   CO2 29 02/22/2016 1615   BUN 6 03/26/2016 0511   BUN <5 (L) 03/24/2016 0427   BUN 6.0 (L) 03/07/2016 1517   BUN 9.5 02/22/2016 1615   CREATININE 0.63 03/26/2016 0511   CREATININE 0.70 03/24/2016 0427   CREATININE 0.7 03/07/2016 1517   CREATININE 0.8 02/22/2016 1615   GLUCOSE 86 03/26/2016 0511   GLUCOSE 88 03/24/2016 0427   GLUCOSE 79 03/07/2016 1517   GLUCOSE 86 02/22/2016 1615   CALCIUM 8.3 (L) 03/26/2016 0511   CALCIUM 8.1 (L) 03/24/2016 0427   CALCIUM 8.9 03/07/2016 1517   CALCIUM 9.2 02/22/2016 1615   AST 21 03/14/2016 1600   AST 24 03/07/2016 1517   AST 25 02/22/2016 1615   ALT 10 (L) 03/14/2016 1600   ALT 19 03/07/2016 1517   ALT 14 02/22/2016 1615   ALKPHOS 75 03/14/2016 1600   ALKPHOS 92 03/07/2016 1517   ALKPHOS 92 02/22/2016 1615   BILITOT 0.9 03/14/2016 1600   BILITOT 2.03 (H) 03/07/2016 1517   BILITOT 1.59 (H) 02/22/2016 1615   PROT 7.4 03/14/2016 1600   PROT 7.2 03/07/2016 1517   PROT 7.2 02/22/2016 1615   ALBUMIN 4.2 03/14/2016 1600   ALBUMIN 3.8 03/07/2016 1517   ALBUMIN 3.8 02/22/2016 1615     Studies/Results: No results found.  Anti-infectives: Anti-infectives    Start     Dose/Rate Route Frequency Ordered Stop   03/21/16 2100  cefoTEtan (CEFOTAN) 2 g in dextrose 5 % 50 mL IVPB     2 g 100 mL/hr over 30 Minutes Intravenous Every 12 hours 03/21/16 1339 03/21/16 2149   03/21/16 0647  cefoTEtan (CEFOTAN) 2 g in dextrose 5 % 50 mL IVPB     2  g 100 mL/hr over 30 Minutes Intravenous On call to O.R. 03/21/16 6466 03/21/16 0908      Assessment    Rectal cancer (stage 3)-s/p ROBOTIC ASSISTED LOWER ANTERIOR RESECTION 03/21/16->progressing well on colorectal pathway and has met criteria for discharge; drain output looks better this AM    LOS: 5 days   Plan: Discharge with drain today.  Discharge instructions given to her.  Follow up with Dr. Marcello Moores in 2 weeks.   Nova Schmuhl J 03/26/2016

## 2016-03-26 NOTE — Discharge Summary (Signed)
Physician Discharge Summary  Patient ID: Kaitlyn Morgan MRN: [id number] DOB/AGE: December 10, 1965 50 y.o.  Admit date: 03/21/2016 Discharge date: 03/26/2016  Admission Diagnoses:  Rectal Cancer  Discharge Diagnoses:  Active Problems:   Rectal cancer (Stage 3) s/p robotic assisted LAR 03/21/16   Discharged Condition: good  Hospital Course: She underwent the above surgery and was placed on the post colorectal surgery pathway.  Final pathology was c/w stage 3 disease with 1/27 lymph nodes involved with tumor.  She had slow return of bowel function.  She had some dark drain output which was serous the day of discharge.  She was tolerated her diet, had return of bowel activity and was able to be discharged on POD #5.  She will go home with her drain and return the week of 8/22 for a follow up appointment.  Discharge instructions were given to her.  Discharge Exam: Blood pressure (!) 97/56, pulse 81, temperature 99 F (37.2 C), temperature source Oral, resp. rate 20, height 5\' 2"  (1.575 m), weight 76.2 kg (168 lb), SpO2 97 %.   Disposition: Home.     Medication List    STOP taking these medications   MIRALAX PO   phentermine 37.5 MG tablet Commonly known as:  ADIPEX-P   prochlorperazine 10 MG tablet Commonly known as:  COMPAZINE   SENOKOT S 8.6-50 MG tablet Generic drug:  senna-docusate     TAKE these medications   estradiol 2 MG tablet Commonly known as:  ESTRACE TK 1 T PO QD   GINGER ROOT PO Take 1 capsule by mouth as needed.   ondansetron 8 MG tablet Commonly known as:  ZOFRAN Take 1 tablet (8 mg total) by mouth every 8 (eight) hours as needed for nausea.   traMADol 50 MG tablet Commonly known as:  ULTRAM Take 1-2 tablets (50-100 mg total) by mouth every 6 (six) hours as needed for moderate pain or severe pain.   valACYclovir 500 MG tablet Commonly known as:  VALTREX take 1 tablet by mouth as needed when has outbreak   for BID x 3 days.      Follow-up Information     Rosario Adie., MD. Schedule an appointment as soon as possible for a visit in 2 week(s).   Specialty:  General Surgery Contact information: [address] [place] [address] [phone]           Signed: Odis Hollingshead 03/26/2016, 9:24 AM

## 2016-04-02 ENCOUNTER — Telehealth: Payer: Self-pay | Admitting: Surgery

## 2016-04-02 NOTE — Telephone Encounter (Signed)
Kaitlyn Morgan  1966/08/04 [id number]  Patient Care Team: Truitt Merle, MD as Consulting Physician (Hematology) Kyung Rudd, MD as Consulting Physician (Radiation Oncology) Jackolyn Confer, MD as Consulting Physician (General Surgery) Richmond Campbell, MD as Consulting Physician (Gastroenterology) Milus Banister, MD as Attending Physician (Gastroenterology) Tania Ade, RN as Registered Nurse  This patient is a 50 y.o.female who calls today for surgical evaluation.   Reason for call: Concern of drain.  Patient status post low anterior resection for rectal cancer.  Went home postoperative drain.  Patient noted some increased irritation and drainage around tube site.  Had one episode of nausea last night but resolved.  No emesis.  Moving bowels fine.  Otherwise has been eating okay.  No fevers chills or sweats.  No major drainage.  I recommended cleaning the drain site with some warm soap and water and dilute peroxide to keep the drain site clean and dry.  Keep appointment to see Dr. Marcello Moores in two days.  She does not sound toxic nor any strong history suspicious for major postoperative complications.  The patient seemed reassured  Patient Active Problem List   Diagnosis Date Noted  . Genetic testing 02/19/2016  . Family history of breast cancer   . Family history of uterine cancer   . Rectal cancer ([place]) 12/18/2015    Past Medical History:  Diagnosis Date  . Anemia    during pregnancy, not now.  . 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 Atypical syncope    1 episode -never had follow up with cardiology-never any further episodes, did go to urgent care.  . Colon cancer [facility])    rectal cancer Radiation and oral chemo with radiation.  . Complication of anesthesia   . Family history of breast cancer   . Family history of uterine cancer   . Genital lesion, female    at times  . Heart burn    occ.  Marland Kitchen Hx of seasonal allergies   . Motion sickness   .  PONV (postoperative nausea and vomiting)   . Umbilicus discharge     Past Surgical History:  Procedure Laterality Date  . 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 CESAREAN SECTION    . DIAGNOSTIC LAPAROSCOPY     with D and C and Ovarian cystectomy, tubal ligation done at this time '00  . DILATION AND CURETTAGE OF UTERUS    . TUBAL LIGATION    . XI ROBOTIC ASSISTED LOWER ANTERIOR RESECTION N/A 03/21/2016   Procedure: XI ROBOTIC ASSISTED LOWER ANTERIOR RESECTION;  Surgeon: Leighton Ruff, MD;  Location: [facility];  Service: General;  Laterality: N/A;    Social History   Social History  . Marital status: Legally Separated    Spouse name: N/A  . Number of children: 3  . Years of education: N/A   Occupational History  . Not on file.   Social History Main Topics  . Smoking status: Never Smoker  . Smokeless tobacco: Never Used  . Alcohol use No  . Drug use: No  . Sexual activity: Not on file   Other Topics Concern  . Not on file   Social History Narrative  . No narrative on file    Family History  Problem Relation Age of Onset  . Breast cancer Maternal Grandmother     dx in her 27s  . Cancer Paternal Grandmother 43    uterine cancer   . Hypertension Father   .  COPD Father   . Hypertension Sister   . Hypertension Paternal Grandfather   . Heart attack Paternal Grandfather   . Hypertension Sister   . Skin cancer Cousin     maternal first cousin    Current Outpatient Prescriptions  Medication Sig Dispense Refill  . estradiol (ESTRACE) 2 MG tablet TK 1 T PO QD  4  . Ginger, Zingiber officinalis, (GINGER ROOT PO) Take 1 capsule by mouth as needed.     . ondansetron (ZOFRAN) 8 MG tablet Take 1 tablet (8 mg total) by mouth every 8 (eight) hours as needed for nausea. 30 tablet 3  . traMADol (ULTRAM) 50 MG tablet Take 1-2 tablets (50-100 mg total) by mouth every 6 (six) hours as needed for moderate pain or severe pain. 30 tablet 0  . valACYclovir  (VALTREX) 500 MG tablet take 1 tablet by mouth as needed when has outbreak   for BID x 3 days.  4   No current facility-administered medications for this visit.      Allergies  Allergen Reactions  . Codeine Nausea Only  . Morphine And Related Itching  . Sucralose Nausea And Vomiting    Fever  . Zolpidem Tartrate     Hallucinations     @VS @  Ct Abdomen Pelvis W Contrast  Result Date: 03/15/2016 CLINICAL DATA:  Rectal carcinoma. EXAM: CT ABDOMEN AND PELVIS WITH CONTRAST TECHNIQUE: Multidetector CT imaging of the abdomen and pelvis was performed using the standard protocol following bolus administration of intravenous contrast. CONTRAST:  170mL ISOVUE-300 IOPAMIDOL (ISOVUE-300) INJECTION 61% COMPARISON:  12/10/2015 FINDINGS: Lower chest: The lung bases are clear. No pleural or pericardial effusion noted. Hepatobiliary: There are several low-attenuation foci within the liver which are too small to reliably characterize. These are unchanged when compared with 12/18/2015 when a or characterized as representing small cysts. The gallbladder is normal. There is no biliary dilatation. Pancreas: No mass, inflammatory changes, or other significant abnormality. Spleen: Within normal limits in size and appearance. Adrenals/Urinary Tract: Normal appearance of the adrenal glands. There are several small cysts within the inferior pole of the right kidney. The left kidney appears normal. The urinary bladder appears normal. Stomach/Bowel: The stomach is within normal limits. The small bowel loops have a normal course and caliber. No obstruction. Normal appearance of the colon. The appendix is visualized and appears normal. Mild nonspecific wall thickening involving the sigmoid colon and rectum is again identified, image 64 of series 2. Vascular/Lymphatic: Normal appearance of the abdominal aorta. No enlarged retroperitoneal or mesenteric adenopathy. No enlarged pelvic or inguinal lymph nodes. Reproductive: The  patient is status post hysterectomy. No adnexal mass noted. Other: None. Musculoskeletal:  No suspicious bone lesions identified. IMPRESSION: 1. No acute findings within the abdomen or pelvis. No evidence for metastatic disease. 2. Mild wall thickening involving the distal sigmoid colon and rectum is identified. Nonspecific and may reflect sequelae of external beam radiation. Electronically Signed   By: Kerby Moors M.D.   On: 03/15/2016 08:36    Note: This dictation was prepared with Dragon/digital dictation along with [employer]. Any transcriptional errors that result from this process are unintentional.

## 2016-04-06 ENCOUNTER — Encounter (HOSPITAL_COMMUNITY): Payer: Self-pay

## 2016-04-11 ENCOUNTER — Ambulatory Visit (HOSPITAL_BASED_OUTPATIENT_CLINIC_OR_DEPARTMENT_OTHER): Payer: 59 | Admitting: Hematology

## 2016-04-11 ENCOUNTER — Encounter: Payer: Self-pay | Admitting: *Deleted

## 2016-04-11 ENCOUNTER — Telehealth: Payer: Self-pay | Admitting: Hematology

## 2016-04-11 ENCOUNTER — Encounter: Payer: Self-pay | Admitting: Hematology

## 2016-04-11 VITALS — BP 105/57 | HR 78 | Temp 97.9°F | Resp 18 | Ht 62.0 in | Wt 168.2 lb

## 2016-04-11 DIAGNOSIS — C20 Malignant neoplasm of rectum: Secondary | ICD-10-CM | POA: Diagnosis not present

## 2016-04-11 MED ORDER — CAPECITABINE 500 MG PO TABS: 1500.0000 mg | ORAL_TABLET | Freq: Two times a day (BID) | ORAL | 3 refills | Status: DC

## 2016-04-11 MED ORDER — PROCHLORPERAZINE MALEATE 10 MG PO TABS: 10.0000 mg | ORAL_TABLET | Freq: Four times a day (QID) | ORAL | 1 refills | Status: DC | PRN

## 2016-04-11 MED ORDER — CAPECITABINE 150 MG PO TABS: 300.0000 mg | ORAL_TABLET | Freq: Two times a day (BID) | ORAL | 3 refills | Status: DC

## 2016-04-11 NOTE — Progress Notes (Signed)
[place]  Telephone:([phone] Fax:([phone]) [phone]  Clinic follow Up Note   Patient Care Team: Truitt Merle, MD as Consulting Physician (Hematology) Kyung Rudd, MD as Consulting Physician (Radiation Oncology) Jackolyn Confer, MD as Consulting Physician (General Surgery) Richmond Campbell, MD as Consulting Physician (Gastroenterology) Milus Banister, MD as Attending Physician (Gastroenterology) Tania Ade, RN as Registered Nurse Leighton Ruff, MD as Consulting Physician (General Surgery) 04/11/2016   CHIEF COMPLAINTS:  Follow up rectal cancer  Oncology History   Rectal cancer [facility])   Staging form: Colon and Rectum, AJCC 7th Edition     Clinical stage from 12/03/2015: Stage IIIB (T3, N1, M0) - Signed by Truitt Merle, MD on 12/18/2015       Rectal cancer ([place])   12/03/2015 Initial Diagnosis    Rectal cancer ([place])      12/03/2015 Procedure    Colonoscopy showed a partially obstructing tumor in the proximal rectum, its diameter measured 6 mm, biopsed      12/03/2015 Initial Biopsy    Rectum mass biopsy showed adenocarcinoma, moderately differentiated      12/10/2015 Imaging    CT abdomen and pelvis with contrast showed luminal narrowing and mural thickening in the proximal to mid rectum. There is a 6 mm short axis mesorectal lymph node suspicious for metastasis. There are several tiny indeterminate liver lesions       12/17/2015 Imaging    MRI pelvis with and without contrast showed a advanced T3 rectal adenocarcinoma (7cm), N1 disease (at least 2 left-sided nasal rectal lymph nodes are seen measuring 6-7 mm), distance from tumor to the sphincter is 8 cm      12/17/2015 Imaging    CT chest with contrast showed no evidence of metastasis or other changes      12/30/2015 - 02/08/2016 Radiation Therapy    neoadjuvant radiation to rectal cancer       12/30/2015 - 02/08/2016 Chemotherapy    xeloda 1532m twice daily with concurent radiation, she tolerated well       03/21/2016 Surgery    Rectosigmoid segmental resection       03/21/2016 Pathology Results    Invasive well differentiated adenocarcinoma, 5cm,  margins are negative, LVI (-), 1 out of 27 nodes were negative.         HISTORY OF PRESENTING ILLNESS:  Kaitlyn APatman89y.o. female is here because of Her newly diagnosed rectal cancer. She is accompanied by her daughter and her mother to our multidisciplinary GI clinic today.  She has had chronic abdominal pain for 2-3 years, in the epigastric area and low abdomen, which has been worse lately, and she noticed constipation, abdominal bloating and mild rectal bleeding for 2 months. She was referred to Dr. MEarlean Shawland underwent colonoscopy on 12/03/2015. A partial obstructive rectal mass was found in the proximal rectum, biopsy showed adenocarcinoma. She was referred to surgeon Dr. RZella Richerwho referred her to uK[place] consider new adjuvant chemoradiation.  She feels well overall, has mild fatigue, but able to tolerate her routine full-time job in daily activities without difficulties. She has good appetite, but eats less due to the epigastric pain after meal. She denies any other significant pain, cough, or other symptoms. No recent weight loss. She has been on phentermine for weight loss recently, and stopped a few days ago.  CURRENT THERAPY: pending adjuvant chemotherapy with CAPOX   INTERIM HISTORY:  Mrs AGarnettreturns for follow up. She underwent surgery on 8/7. She has been recovering well  overall, the drainage tube was removed last week, she has small loose BM 2-3 times a day, pain at incision has much improved, she take ibuprofen as needed. Her appetite is good, energy has much improved,   MEDICAL HISTORY:  Past Medical History:  Diagnosis Date  . Anemia    during pregnancy, not now.  . 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 Atypical syncope    1 episode -never had follow up with cardiology-never any  further episodes, did go to urgent care.  . Colon cancer [facility])    rectal cancer Radiation and oral chemo with radiation.  . Complication of anesthesia   . Family history of breast cancer   . Family history of uterine cancer   . Genital lesion, female    at times  . Heart burn    occ.  Marland Kitchen Hx of seasonal allergies   . Motion sickness   . PONV (postoperative nausea and vomiting)   . Umbilicus discharge     SURGICAL HISTORY: Past Surgical History:  Procedure Laterality Date  . 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 CESAREAN SECTION    . DIAGNOSTIC LAPAROSCOPY     with D and C and Ovarian cystectomy, tubal ligation done at this time '00  . DILATION AND CURETTAGE OF UTERUS    . TUBAL LIGATION    . XI ROBOTIC ASSISTED LOWER ANTERIOR RESECTION N/A 03/21/2016   Procedure: XI ROBOTIC ASSISTED LOWER ANTERIOR RESECTION;  Surgeon: Leighton Ruff, MD;  Location: [facility];  Service: General;  Laterality: N/A;    SOCIAL HISTORY: Social History   Social History  . Marital status: Legally Separated    Spouse name: N/A  . Number of children: 3  . Years of education: N/A   Occupational History  . Not on file.   Social History Main Topics  . Smoking status: Never Smoker  . Smokeless tobacco: Never Used  . Alcohol use No  . Drug use: No  . Sexual activity: Not on file   Other Topics Concern  . Not on file   Social History Narrative  . No narrative on file   She has 3 children, she lives with her twin sons who are 52. She works for [employer]   FAMILY HISTORY: Family History  Problem Relation Age of Onset  . Breast cancer Maternal Grandmother     dx in her 33s  . Cancer Paternal Grandmother 3    uterine cancer   . Hypertension Father   . COPD Father   . Hypertension Sister   . Hypertension Paternal Grandfather   . Heart attack Paternal Grandfather   . Hypertension Sister   . Skin cancer Cousin     maternal first cousin    ALLERGIES:  is  allergic to codeine; morphine and related; sucralose; and zolpidem tartrate.  MEDICATIONS:  Current Outpatient Prescriptions  Medication Sig Dispense Refill  . estradiol (ESTRACE) 2 MG tablet TK 1 T PO QD  4  . Ginger, Zingiber officinalis, (GINGER ROOT PO) Take 1 capsule by mouth as needed.     . ondansetron (ZOFRAN) 8 MG tablet Take 1 tablet (8 mg total) by mouth every 8 (eight) hours as needed for nausea. 30 tablet 3  . traMADol (ULTRAM) 50 MG tablet Take 1-2 tablets (50-100 mg total) by mouth every 6 (six) hours as needed for moderate pain or severe pain. 30 tablet 0  . valACYclovir (VALTREX)  500 MG tablet take 1 tablet by mouth as needed when has outbreak   for BID x 3 days.  4   No current facility-administered medications for this visit.     REVIEW OF SYSTEMS:   Constitutional: Denies fevers, chills or abnormal night sweats Eyes: Denies blurriness of vision, double vision or watery eyes Ears, nose, mouth, throat, and face: Denies mucositis or sore throat Respiratory: Denies cough, dyspnea or wheezes Cardiovascular: Denies palpitation, chest discomfort or lower extremity swelling Gastrointestinal:  Denies nausea, heartburn or change in bowel habits Skin: Denies abnormal skin rashes Lymphatics: Denies new lymphadenopathy or easy bruising Neurological:Denies numbness, tingling or new weaknesses Behavioral/Psych: Mood is stable, no new changes  All other systems were reviewed with the patient and are negative.  PHYSICAL EXAMINATION: ECOG PERFORMANCE STATUS: 1 - Symptomatic but completely ambulatory  Vitals:   04/11/16 1530  BP: (!) 105/57  Pulse: 78  Resp: 18  Temp: 97.9 F (36.6 C)   Filed Weights   04/11/16 1530  Weight: 168 lb 3.2 oz (76.3 kg)    GENERAL:alert, no distress and comfortable SKIN: skin color, texture, turgor are normal, no rashes or significant lesions EYES: normal, conjunctiva are pink and non-injected, sclera clear OROPHARYNX:no exudate, no erythema  and lips, buccal mucosa, and tongue normal  NECK: supple, thyroid normal size, non-tender, without nodularity LYMPH:  no palpable lymphadenopathy in the cervical, axillary or inguinal LUNGS: clear to auscultation and percussion with normal breathing effort HEART: regular rate & rhythm and no murmurs and no lower extremity edema ABDOMEN:abdomen soft, non-tender and normal bowel sounds.  Musculoskeletal:no cyanosis of digits and no clubbing  PSYCH: alert & oriented x 3 with fluent speech NEURO: no focal motor/sensory deficits  LABORATORY DATA:  I have reviewed the data as listed CBC Latest Ref Rng & Units 03/26/2016 03/25/2016 03/24/2016  WBC 4.0 - 10.5 K/uL 4.7 - 3.5(L)  Hemoglobin 12.0 - 15.0 g/dL 9.6(L) 9.5(L) 9.1(L)  Hematocrit 36.0 - 46.0 % 27.9(L) 27.8(L) 27.1(L)  Platelets 150 - 400 K/uL 183 - 146(L)    CMP Latest Ref Rng & Units 03/26/2016 03/24/2016 03/23/2016  Glucose 65 - 99 mg/dL 86 88 95  BUN 6 - 20 mg/dL 6 <5(L) 6  Creatinine 0.44 - 1.00 mg/dL 0.63 0.70 0.70  Sodium 135 - 145 mmol/L 139 140 138  Potassium 3.5 - 5.1 mmol/L 4.1 3.9 4.0  Chloride 101 - 111 mmol/L 105 109 109  CO2 22 - 32 mmol/L '29 26 27  ' Calcium 8.9 - 10.3 mg/dL 8.3(L) 8.1(L) 8.2(L)  Total Protein 6.5 - 8.1 g/dL - - -  Total Bilirubin 0.3 - 1.2 mg/dL - - -  Alkaline Phos 38 - 126 U/L - - -  AST 15 - 41 U/L - - -  ALT 14 - 54 U/L - - -     PATHOLOGY REPORT Diagnosis 12/03/2015 Colon, sigmoid, mass, biopsy -Invasive adenocarcinoma, moderately differentiated. See comment.  Comment: due to the presence of adenocarcinoma, IHC for DNA mismatch repair proteins will be performed and reported in an addendum.  Diagnosis 03/21/2016 1. Colon, segmental resection for tumor, rectosigmoid - INVASIVE WELL DIFFERENTIATED ADENOCARCINOMA, SPANNING 5 CM IN GREATEST DIMENSION. - TUMOR INVADES THROUGH MUSCULARIS PROPRIA TO INVOLVE SUBSEROSAL SOFT TISSUES. - MARGINS ARE NEGATIVE. - ONE OF TWENTY-SEVEN LYMPH NODES IS POSITIVE  FOR METASTATIC ADENOCARCINOMA (1/27). - SEE ONCOLOGY TEMPLATE. 2. Colon, resection margin (donut), distal rectal, final margin - BENIGN COLORECTAL MUCOSA. - NO TUMOR SEEN. Microscopic Comment 1. COLON AND RECTUM (INCLUDING  TRANS-ANAL RESECTION): Specimen: Rectosigmoid colon. Procedure: Robotic lower anterior resection. Tumor site: Rectum. Specimen integrity: Intact. Macroscopic intactness of mesorectum: Complete. Macroscopic tumor perforation: Not identified. Invasive tumor: Maximum size: 5 cm. Histologic type(s): Adenocarcinoma. Histologic grade and differentiation: G1: well differentiated/low grade. Type of polyp in which invasive carcinoma arose: Tubular adenoma with high grade dysplasia. Microscopic extension of invasive tumor: Tumor invades through muscularis propria to involve subserosal soft tissues. Lymph-Vascular invasion: Although definitive lymph/vascular invasion is not identified, there is lymph node positivity (see below). Peri-neural invasion: None identified. Tumor deposit(s) (discontinuous extramural extension): None identified. Resection margins: Proximal margin: Negative. Distal margin: Negative. Circumferential (radial) (posterior ascending, posterior descending; lateral and posterior mid-rectum; and entire lower 1/3 rectum): Negative. Mesenteric margin: Not applicable. Distance closest margin (if all above margins negative): .2.4 cm (distal margin) Trans-anal resection margins only: Not applicable. Deep margin: Not applicable. Mucosal Margin: Not applicable. Distance closest mucosal margin (if negative): Not applicable. Treatment effect (neo-adjuvant therapy): Minimal treatment effect is present in the primary tumor. Minimal treatment effect is also present in the positive lymph nodes with the other lymph nodes showing no degree of treatment effect. Additional polyp(s): No additional polyps identified. Non-neoplastic findings: Diverticulosis. Lymph nodes:  number examined 27; number positive: 1. Pathologic Staging: ypT3, ypN1a. Ancillary studies: As the patient is status-post neoadjuvant therapy, MSI by PCR and MMR by [facility] will not be sent.   RADIOGRAPHIC STUDIES: I have personally reviewed the radiological images as listed and agreed with the findings in the report. Ct Abdomen Pelvis W Contrast  Result Date: 03/15/2016 CLINICAL DATA:  Rectal carcinoma. EXAM: CT ABDOMEN AND PELVIS WITH CONTRAST TECHNIQUE: Multidetector CT imaging of the abdomen and pelvis was performed using the standard protocol following bolus administration of intravenous contrast. CONTRAST:  131m ISOVUE-300 IOPAMIDOL (ISOVUE-300) INJECTION 61% COMPARISON:  12/10/2015 FINDINGS: Lower chest: The lung bases are clear. No pleural or pericardial effusion noted. Hepatobiliary: There are several low-attenuation foci within the liver which are too small to reliably characterize. These are unchanged when compared with 12/18/2015 when a or characterized as representing small cysts. The gallbladder is normal. There is no biliary dilatation. Pancreas: No mass, inflammatory changes, or other significant abnormality. Spleen: Within normal limits in size and appearance. Adrenals/Urinary Tract: Normal appearance of the adrenal glands. There are several small cysts within the inferior pole of the right kidney. The left kidney appears normal. The urinary bladder appears normal. Stomach/Bowel: The stomach is within normal limits. The small bowel loops have a normal course and caliber. No obstruction. Normal appearance of the colon. The appendix is visualized and appears normal. Mild nonspecific wall thickening involving the sigmoid colon and rectum is again identified, image 64 of series 2. Vascular/Lymphatic: Normal appearance of the abdominal aorta. No enlarged retroperitoneal or mesenteric adenopathy. No enlarged pelvic or inguinal lymph nodes. Reproductive: The patient is status post hysterectomy. No  adnexal mass noted. Other: None. Musculoskeletal:  No suspicious bone lesions identified. IMPRESSION: 1. No acute findings within the abdomen or pelvis. No evidence for metastatic disease. 2. Mild wall thickening involving the distal sigmoid colon and rectum is identified. Nonspecific and may reflect sequelae of external beam radiation. Electronically Signed   By: TKerby MoorsM.D.   On: 03/15/2016 08:36   COLONOSCOPY DR. MEDOFF 12/03/2015  Impression Malignant partially obstructing tumor in the proximal rectum, its diameter measured 6 mm, biopsy. Distal margin at 15 cm.    ASSESSMENT & PLAN:  50year old Caucasian female, without significant past medical history, presented with abdominal  pain and rectal bleeding. Colonoscopy showed a obstructing tumor in the proximal rectum.  1. Rectal adenocarcinoma, proximal rectum, cT3N1M0, ypT3N1aM0, stage IIIB, moderately differentiated --I have reviewed her surgical path findings with pt in details -she has significant residual disease after neoadjuvant chemo and radiaiton -I discussed the high risk of cancer recurrence after surgery, I recommend adjuvant chemo -we discussed the option of FOLFOX and CAPOX. She opted CAPOX  -Based on the recent adjuvant 3 months vs 6 months chemotherapy date (one trial included rectal cancel also) which was reported in ASCO 2017, I recommend short course CAPOX, a total of 3-4 cycles.  --Chemotherapy consent: Side effects including but does not not limited to, fatigue, nausea, vomiting, diarrhea, hair loss, neuropathy, fluid retention, renal and kidney dysfunction, neutropenic fever, needed for blood transfusion, bleeding, were discussed with patient in great detail. She agrees to proceed. -we plan to start chem in 2 weeks   2. Genetics -she was seen by Dietitian, and her genetic test was negative for lynch syndrome or the following 32 genes: APC, ATM, AXIN2, BARD1, BMPR1A, BRCA1, BRCA2, BRIP1, CDH1, CDK4, CDKN2A,  CHEK2, EPCAM, FANCC, MLH1, MSH2, MSH6, MUTYH, NBN, PALB2, PMS2, POLD1, POLE, PTEN, RAD51C, RAD51D, SCG5/GREM1, SMAD4, STK11, TP53, VHL, and XRCC2.    Plan -start adjuvant chemo CAPOX in 2 weeks. I will call in her capecitabine 1883m bid, 2 weeks on and one week off -I will see her before first cycle chemo   All questions were answered. The patient knows to call the clinic with any problems, questions or concerns.  I spent 25 minutes counseling the patient face to face. The total time spent in the appointment was 30 minutes and more than 50% was on counseling.     FTruitt Merle MD 04/11/2016 3:54 PM

## 2016-04-11 NOTE — Telephone Encounter (Signed)
Gave patient avs report and appointments for September.  °

## 2016-04-11 NOTE — Progress Notes (Signed)
Oncology Nurse Navigator Documentation  Oncology Nurse Navigator Flowsheets 04/11/2016  Navigator Location [facility]Med [facility]  Navigator Encounter Type Follow-up Appt  Telephone -  Abnormal Finding Date -  Confirmed Diagnosis Date -  Surgery Date -  Treatment Initiated Date -  Patient Visit Type MedOnc;Follow-up  Treatment Phase Pre-Tx/Tx Discussion  Barriers/Navigation Needs Education  Education  Symptom Management--side effects of CAPOX, take colace if needed to keep stools soft, push fluids day before chemo for venous access.  Interventions -Gave patient printed information on Oxaliplatin  Referrals -  Coordination of Care -  Support Groups/Services -  Acuity Level 1  Time Spent with Patient 15  Plan is for #3-4 cycles of CAPOX. Patient wants to try peripheral route instead of port.

## 2016-04-16 ENCOUNTER — Encounter: Payer: Self-pay | Admitting: Hematology

## 2016-04-25 ENCOUNTER — Telehealth: Payer: Self-pay | Admitting: *Deleted

## 2016-04-25 NOTE — Telephone Encounter (Signed)
Agree with the plan, thanks   Kye Silverstein MD  

## 2016-04-25 NOTE — Telephone Encounter (Signed)
TC from patient stating that she has had nausea/vomiting since Friday. She also had some watery stools on Friday and temp of 100.1 Diarrhea has subsided as of Saturday. No fevers since Friday. Nausea persisits and pt states she is not able to eat or drink much. She states she is feeling weak and fatigued, light headed. She had called her surgeon's office over the weekend and this am and was told not to take anything for symptoms, to just let this 'virus' run its course.  Therefore patient has not taken any compazine or zofran for nausea. Instructed patient to take anti-nausea meds. Pt does work from home and does not want to get sleepy. Advised to use Zofran then, during the day; compazine at night. Also advised pt to increase fluid intake-whether water, gatorade, juices etc.  Advised to start with clear liquids, crackers and then advance to bland foods as nausea meds kick in.  Patient asking If she will get her chemo on Wednesday or not as she feels weak already.  Advised pt to keep appt on Wednesday for labs and appt with Dr. Burr Medico.  Dr. Burr Medico will advise her on Wednesday as to whether she will get her chemo as scheduled or not.  Informed patient that I would let Dr. Burr Medico know of her current siuation and if she needs to come in for any IV fuids prior to Wednesday's appt.  Pt voiced understanding to the above.
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
## 2016-04-27 ENCOUNTER — Encounter: Payer: Self-pay | Admitting: Hematology

## 2016-04-27 ENCOUNTER — Other Ambulatory Visit (HOSPITAL_BASED_OUTPATIENT_CLINIC_OR_DEPARTMENT_OTHER): Payer: 59

## 2016-04-27 ENCOUNTER — Ambulatory Visit (HOSPITAL_BASED_OUTPATIENT_CLINIC_OR_DEPARTMENT_OTHER): Payer: 59 | Admitting: Hematology

## 2016-04-27 ENCOUNTER — Telehealth: Payer: Self-pay | Admitting: *Deleted

## 2016-04-27 ENCOUNTER — Telehealth: Payer: Self-pay | Admitting: Hematology

## 2016-04-27 ENCOUNTER — Ambulatory Visit: Payer: 59

## 2016-04-27 VITALS — BP 105/66 | HR 68 | Temp 97.5°F | Resp 17 | Ht 62.0 in | Wt 165.6 lb

## 2016-04-27 DIAGNOSIS — C20 Malignant neoplasm of rectum: Secondary | ICD-10-CM

## 2016-04-27 DIAGNOSIS — R110 Nausea: Secondary | ICD-10-CM

## 2016-04-27 DIAGNOSIS — R197 Diarrhea, unspecified: Secondary | ICD-10-CM | POA: Diagnosis not present

## 2016-04-27 DIAGNOSIS — R630 Anorexia: Secondary | ICD-10-CM | POA: Diagnosis not present

## 2016-04-27 LAB — CBC WITH DIFFERENTIAL/PLATELET
BASO%: 1.3 % (ref 0.0–2.0)
Basophils Absolute: 0 10*3/uL (ref 0.0–0.1)
EOS%: 3.4 % (ref 0.0–7.0)
Eosinophils Absolute: 0.1 10*3/uL (ref 0.0–0.5)
HCT: 34.5 % — ABNORMAL LOW (ref 34.8–46.6)
HGB: 11.4 g/dL — ABNORMAL LOW (ref 11.6–15.9)
LYMPH%: 23.7 % (ref 14.0–49.7)
MCH: 29.4 pg (ref 25.1–34.0)
MCHC: 33.2 g/dL (ref 31.5–36.0)
MCV: 88.5 fL (ref 79.5–101.0)
MONO#: 0.2 10*3/uL (ref 0.1–0.9)
MONO%: 11 % (ref 0.0–14.0)
NEUT#: 1.3 10*3/uL — ABNORMAL LOW (ref 1.5–6.5)
NEUT%: 60.6 % (ref 38.4–76.8)
Platelets: 229 10*3/uL (ref 145–400)
RBC: 3.9 10*6/uL (ref 3.70–5.45)
RDW: 14.7 % — ABNORMAL HIGH (ref 11.2–14.5)
WBC: 2.1 10*3/uL — ABNORMAL LOW (ref 3.9–10.3)
lymph#: 0.5 10*3/uL — ABNORMAL LOW (ref 0.9–3.3)

## 2016-04-27 LAB — COMPREHENSIVE METABOLIC PANEL
ALT: 14 U/L (ref 0–55)
AST: 25 U/L (ref 5–34)
Albumin: 3.6 g/dL (ref 3.5–5.0)
Alkaline Phosphatase: 71 U/L (ref 40–150)
Anion Gap: 10 mEq/L (ref 3–11)
BUN: 6.2 mg/dL — ABNORMAL LOW (ref 7.0–26.0)
CO2: 27 mEq/L (ref 22–29)
Calcium: 9.2 mg/dL (ref 8.4–10.4)
Chloride: 107 mEq/L (ref 98–109)
Creatinine: 0.7 mg/dL (ref 0.6–1.1)
EGFR: >90 mL/min/{1.73_m2} (ref >90)
Glucose: 80 mg/dl (ref 70–140)
Potassium: 4.1 mEq/L (ref 3.5–5.1)
Sodium: 144 mEq/L (ref 136–145)
Total Bilirubin: 0.88 mg/dL (ref 0.20–1.20)
Total Protein: 7.2 g/dL (ref 6.4–8.3)

## 2016-04-27 LAB — CEA (IN HOUSE-CHCC): CEA (CHCC-In House): <1 ng/mL (ref 0.00–5.00)

## 2016-04-27 NOTE — Telephone Encounter (Signed)
Per LOS I have scheduled appts and notified the scheduler 

## 2016-04-27 NOTE — Telephone Encounter (Signed)
Message sent to infusion scheduler to add infusion, per 04/27/16 los. Avs report and schedule given per 04/27/16 los.

## 2016-04-27 NOTE — Progress Notes (Signed)
[facility]  [phone] [phone]  Clinic follow Up Note   Patient Care Team: Truitt Merle, MD as Consulting Physician (Hematology) Kyung Rudd, MD as Consulting Physician (Radiation Oncology) Jackolyn Confer, MD as Consulting Physician (General Surgery) Richmond Campbell, MD as Consulting Physician (Gastroenterology) Milus Banister, MD as Attending Physician (Gastroenterology) Tania Ade, RN as Registered Nurse Leighton Ruff, MD as Consulting Physician (General Surgery) 04/27/2016   CHIEF COMPLAINTS:  Follow up rectal cancer  Oncology History   Rectal cancer Kaitlyn Morgan)   Staging form: Colon and Rectum, AJCC 7th Edition     Clinical stage from 12/03/2015: Stage IIIB (T3, N1, M0) - Signed by Truitt Merle, MD on 12/18/2015       Rectal cancer (Kaitlyn Morgan)   12/03/2015 Initial Diagnosis    Rectal cancer (Kaitlyn Morgan)      12/03/2015 Procedure    Colonoscopy showed a partially obstructing tumor in the proximal rectum, its diameter measured 6 mm, biopsed      12/03/2015 Initial Biopsy    Rectum mass biopsy showed adenocarcinoma, moderately differentiated      12/10/2015 Imaging    CT abdomen and pelvis with contrast showed luminal narrowing and mural thickening in the proximal to mid rectum. There is a 6 mm short axis mesorectal lymph node suspicious for metastasis. There are several tiny indeterminate liver lesions       12/17/2015 Imaging    MRI pelvis with and without contrast showed a advanced T3 rectal adenocarcinoma (7cm), N1 disease (at least 2 left-sided nasal rectal lymph nodes are seen measuring 6-7 mm), distance from tumor to the sphincter is 8 cm      12/17/2015 Imaging    CT chest with contrast showed no evidence of metastasis or other changes      12/30/2015 - 02/08/2016 Radiation Therapy    neoadjuvant radiation to rectal cancer       12/30/2015 - 02/08/2016 Chemotherapy    xeloda 1554m twice daily with concurent radiation, she tolerated well       03/21/2016 Surgery    Rectosigmoid segmental resection       03/21/2016 Pathology Results    Invasive well differentiated adenocarcinoma, 5cm,  margins are negative, LVI (-), 1 out of 27 nodes were negative.         HISTORY OF PRESENTING ILLNESS:  Kaitlyn [id number].o. female is here because of Her newly diagnosed rectal cancer. She is accompanied by her daughter and her mother to our multidisciplinary GI clinic today.  She has had chronic abdominal pain for 2-3 years, in the epigastric area and low abdomen, which has been worse lately, and she noticed constipation, abdominal bloating and mild rectal bleeding for 2 months. She was referred to Dr. MEarlean Shawland underwent colonoscopy on 12/03/2015. A partial obstructive rectal mass was found in the proximal rectum, biopsy showed adenocarcinoma. She was referred to surgeon Dr. RZella Richerwho referred her to [place] consider new adjuvant chemoradiation.  She feels well overall, has mild fatigue, but able to tolerate her routine full-time job in daily activities without difficulties. She has good appetite, but eats less due to the epigastric pain after meal. She denies any other significant pain, cough, or other symptoms. No recent weight loss. She has been on phentermine for weight loss recently, and stopped a few days ago.  CURRENT THERAPY: pending adjuvant chemotherapy with CAPOX   INTERIM HISTORY:  Kaitlyn AMontsreturns for follow up. She developed fever (100.4), nausea and vomiting and diarrhea last  Friday, with fatigue and poor appetite, her symptoms has gradually improved, fever quickly resolved after a few hours, BM is Normal now, her appetite is improving. She has been drinking fluids adequately, no dizziness or other complaints.  MEDICAL HISTORY:  Past Medical History:  Diagnosis Date  . Anemia    during pregnancy, not now.  . 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 Atypical syncope    1 episode -never had  follow up with cardiology-never any further episodes, did go to urgent care.  . Colon cancer [facility])    rectal cancer Radiation and oral chemo with radiation.  . Complication of anesthesia   . Family history of breast cancer   . Family history of uterine cancer   . Genital lesion, female    at times  . Heart burn    occ.  Marland Kitchen Hx of seasonal allergies   . Motion sickness   . PONV (postoperative nausea and vomiting)   . Umbilicus discharge     SURGICAL HISTORY: Past Surgical History:  Procedure Laterality Date  . 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 CESAREAN SECTION    . DIAGNOSTIC LAPAROSCOPY     with D and C and Ovarian cystectomy, tubal ligation done at this time '00  . DILATION AND CURETTAGE OF UTERUS    . TUBAL LIGATION    . XI ROBOTIC ASSISTED LOWER ANTERIOR RESECTION N/A 03/21/2016   Procedure: XI ROBOTIC ASSISTED LOWER ANTERIOR RESECTION;  Surgeon: Leighton Ruff, MD;  Location: [facility];  Service: General;  Laterality: N/A;    SOCIAL HISTORY: Social History   Social History  . Marital status: Legally Separated    Spouse name: N/A  . Number of children: 3  . Years of education: N/A   Occupational History  . Not on file.   Social History Main Topics  . Smoking status: Never Smoker  . Smokeless tobacco: Never Used  . Alcohol use No  . Drug use: No  . Sexual activity: Not on file   Other Topics Concern  . Not on file   Social History Narrative  . No narrative on file   She has 3 children, she lives with her twin sons who are 61. She works for [employer]   FAMILY HISTORY: Family History  Problem Relation Age of Onset  . Breast cancer Maternal Grandmother     dx in her 58s  . Cancer Paternal Grandmother 46    uterine cancer   . Hypertension Father   . COPD Father   . Hypertension Sister   . Hypertension Paternal Grandfather   . Heart attack Paternal Grandfather   . Hypertension Sister   . Skin cancer Cousin      maternal first cousin    ALLERGIES:  is allergic to codeine; morphine and related; sucralose; and zolpidem tartrate.  MEDICATIONS:  Current Outpatient Prescriptions  Medication Sig Dispense Refill  . aluminum hydroxide-magnesium carbonate (GAVISCON) 95-358 MG/15ML SUSP Take 15 mLs by mouth as needed.    . capecitabine (XELODA) 150 MG tablet Take 2 tablets (300 mg total) by mouth 2 (two) times daily after a meal. Take on days 1-14 of chemotherapy. (Patient not taking: Reported on 04/27/2016) 56 tablet 3  . capecitabine (XELODA) 500 MG tablet Take 3 tablets (1,500 mg total) by mouth 2 (two) times daily after a meal. Take on days 1-14 of chemotherapy. (Patient not taking: Reported on 04/27/2016) 84 tablet 3  .  estradiol (ESTRACE) 2 MG tablet TK 1 T PO QD  4  . Ginger, Zingiber officinalis, (GINGER ROOT PO) Take 1 capsule by mouth as needed.     . ondansetron (ZOFRAN) 8 MG tablet Take 1 tablet (8 mg total) by mouth every 8 (eight) hours as needed for nausea. (Patient not taking: Reported on 04/27/2016) 30 tablet 3  . prochlorperazine (COMPAZINE) 10 MG tablet Take 1 tablet (10 mg total) by mouth every 6 (six) hours as needed (Nausea or vomiting). (Patient not taking: Reported on 04/27/2016) 30 tablet 1  . traMADol (ULTRAM) 50 MG tablet Take 1-2 tablets (50-100 mg total) by mouth every 6 (six) hours as needed for moderate pain or severe pain. (Patient not taking: Reported on 04/27/2016) 30 tablet 0  . valACYclovir (VALTREX) 500 MG tablet take 1 tablet by mouth as needed when has outbreak   for BID x 3 days.  4   No current facility-administered medications for this visit.     REVIEW OF SYSTEMS:   Constitutional: Denies fevers, chills or abnormal night sweats Eyes: Denies blurriness of vision, double vision or watery eyes Ears, nose, mouth, throat, and face: Denies mucositis or sore throat Respiratory: Denies cough, dyspnea or wheezes Cardiovascular: Denies palpitation, chest discomfort or lower  extremity swelling Gastrointestinal:  Denies nausea, heartburn or change in bowel habits Skin: Denies abnormal skin rashes Lymphatics: Denies new lymphadenopathy or easy bruising Neurological:Denies numbness, tingling or new weaknesses Behavioral/Psych: Mood is stable, no new changes  All other systems were reviewed with the patient and are negative.  PHYSICAL EXAMINATION: ECOG PERFORMANCE STATUS: 1 - Symptomatic but completely ambulatory  Vitals:   04/27/16 0915  BP: 105/66  Pulse: 68  Resp: 17  Temp: 97.5 F (36.4 C)   Filed Weights   04/27/16 0915  Weight: 165 lb 9.6 oz (75.1 kg)    GENERAL:alert, no distress and comfortable SKIN: skin color, texture, turgor are normal, no rashes or significant lesions EYES: normal, conjunctiva are pink and non-injected, sclera clear OROPHARYNX:no exudate, no erythema and lips, buccal mucosa, and tongue normal  NECK: supple, thyroid normal size, non-tender, without nodularity LYMPH:  no palpable lymphadenopathy in the cervical, axillary or inguinal LUNGS: clear to auscultation and percussion with normal breathing effort HEART: regular rate & rhythm and no murmurs and no lower extremity edema ABDOMEN:abdomen soft, non-tender and normal bowel sounds.  Musculoskeletal:no cyanosis of digits and no clubbing  PSYCH: alert & oriented x 3 with fluent speech NEURO: no focal motor/sensory deficits  LABORATORY DATA:  I have reviewed the data as listed CBC Latest Ref Rng & Units 04/27/2016 03/26/2016 03/25/2016  WBC 3.9 - 10.3 10e3/uL 2.1(L) 4.7 -  Hemoglobin 11.6 - 15.9 g/dL 11.4(L) 9.6(L) 9.5(L)  Hematocrit 34.8 - 46.6 % 34.5(L) 27.9(L) 27.8(L)  Platelets 145 - 400 10e3/uL 229 183 -    CMP Latest Ref Rng & Units 04/27/2016 03/26/2016 03/24/2016  Glucose 70 - 140 mg/dl 80 86 88  BUN 7.0 - 26.0 mg/dL 6.2(L) 6 <5(L)  Creatinine 0.6 - 1.1 mg/dL 0.7 0.63 0.70  Sodium 136 - 145 mEq/L 144 139 140  Potassium 3.5 - 5.1 mEq/L 4.1 4.1 3.9  Chloride 101 -  111 mmol/L - 105 109  CO2 22 - 29 mEq/L '27 29 26  ' Calcium 8.4 - 10.4 mg/dL 9.2 8.3(L) 8.1(L)  Total Protein 6.4 - 8.3 g/dL 7.2 - -  Total Bilirubin 0.20 - 1.20 mg/dL 0.88 - -  Alkaline Phos 40 - 150 U/L 71 - -  AST 5 - 34 U/L 25 - -  ALT 0 - 55 U/L 14 - -     PATHOLOGY REPORT Diagnosis 12/03/2015 Colon, sigmoid, mass, biopsy -Invasive adenocarcinoma, moderately differentiated. See comment.  Comment: due to the presence of adenocarcinoma, IHC for DNA mismatch repair proteins will be performed and reported in an addendum.  Diagnosis 03/21/2016 1. Colon, segmental resection for tumor, rectosigmoid - INVASIVE WELL DIFFERENTIATED ADENOCARCINOMA, SPANNING 5 CM IN GREATEST DIMENSION. - TUMOR INVADES THROUGH MUSCULARIS PROPRIA TO INVOLVE SUBSEROSAL SOFT TISSUES. - MARGINS ARE NEGATIVE. - ONE OF TWENTY-SEVEN LYMPH NODES IS POSITIVE FOR METASTATIC ADENOCARCINOMA (1/27). - SEE ONCOLOGY TEMPLATE. 2. Colon, resection margin (donut), distal rectal, final margin - BENIGN COLORECTAL MUCOSA. - NO TUMOR SEEN. Microscopic Comment 1. COLON AND RECTUM (INCLUDING TRANS-ANAL RESECTION): Specimen: Rectosigmoid colon. Procedure: Robotic lower anterior resection. Tumor site: Rectum. Specimen integrity: Intact. Macroscopic intactness of mesorectum: Complete. Macroscopic tumor perforation: Not identified. Invasive tumor: Maximum size: 5 cm. Histologic type(s): Adenocarcinoma. Histologic grade and differentiation: G1: well differentiated/low grade. Type of polyp in which invasive carcinoma arose: Tubular adenoma with high grade dysplasia. Microscopic extension of invasive tumor: Tumor invades through muscularis propria to involve subserosal soft tissues. Lymph-Vascular invasion: Although definitive lymph/vascular invasion is not identified, there is lymph node positivity (see below). Peri-neural invasion: None identified. Tumor deposit(s) (discontinuous extramural extension): None  identified. Resection margins: Proximal margin: Negative. Distal margin: Negative. Circumferential (radial) (posterior ascending, posterior descending; lateral and posterior mid-rectum; and entire lower 1/3 rectum): Negative. Mesenteric margin: Not applicable. Distance closest margin (if all above margins negative): .2.4 cm (distal margin) Trans-anal resection margins only: Not applicable. Deep margin: Not applicable. Mucosal Margin: Not applicable. Distance closest mucosal margin (if negative): Not applicable. Treatment effect (neo-adjuvant therapy): Minimal treatment effect is present in the primary tumor. Minimal treatment effect is also present in the positive lymph nodes with the other lymph nodes showing no degree of treatment effect. Additional polyp(s): No additional polyps identified. Non-neoplastic findings: Diverticulosis. Lymph nodes: number examined 27; number positive: 1. Pathologic Staging: ypT3, ypN1a. Ancillary studies: As the patient is status-post neoadjuvant therapy, MSI by PCR and MMR by [facility] will not be sent.   RADIOGRAPHIC STUDIES: I have personally reviewed the radiological images as listed and agreed with the findings in the report. No results found. COLONOSCOPY DR. MEDOFF 12/03/2015  Impression Malignant partially obstructing tumor in the proximal rectum, its diameter measured 6 mm, biopsy. Distal margin at 15 cm.    ASSESSMENT & PLAN:  50 year old Caucasian female, without significant past medical history, presented with abdominal pain and rectal bleeding. Colonoscopy showed a obstructing tumor in the proximal rectum.  1. Rectal adenocarcinoma, proximal rectum, cT3N1M0, ypT3N1aM0, stage IIIB, moderately differentiated --I have reviewed her surgical path findings with pt in details -she has significant residual disease after neoadjuvant chemo and radiaiton -I discussed the high risk of cancer recurrence after surgery, I recommend adjuvant chemo -we  discussed the option of FOLFOX and CAPOX. She opted CAPOX  -Based on the recent adjuvant 3 months vs 6 months chemotherapy date (one trial included rectal cancel also) which was reported in ASCO 2017, I recommend short course CAPOX, a total of 3-4 cycles.  -She is scheduled to start adjuvant chemotherapy today, due to her recent GI symptoms, I'll postpone her chemotherapy to later next week.  2. Nausea, diarrhea and poor po intake  -Her symptoms started last Friday, she also had a transient fever, likely virus gastritis -Her symptoms improving, she does not appear to be  dehydrated, we'll continue monitoring. I encouraged her to continue drink fluids adequately.  3. Genetics -she was seen by genetic counselor, and her genetic test was negative for lynch syndrome or the following 32 genes: APC, ATM, AXIN2, BARD1, BMPR1A, BRCA1, BRCA2, BRIP1, CDH1, CDK4, CDKN2A, CHEK2, EPCAM, FANCC, MLH1, MSH2, MSH6, MUTYH, NBN, PALB2, PMS2, POLD1, POLE, PTEN, RAD51C, RAD51D, SCG5/GREM1, SMAD4, STK11, TP53, VHL, and XRCC2.    Plan -postpone first cycle CAPOX to next week  -I will see her before first cycle chemo   All questions were answered. The patient knows to call the clinic with any problems, questions or concerns.  I spent 15 minutes counseling the patient face to face. The total time spent in the appointment was 20 minutes and more than 50% was on counseling.     Truitt Merle, MD 04/27/2016 2:54 PM

## 2016-04-28 LAB — CEA: CEA: 0.6 ng/mL (ref 0.0–4.7)

## 2016-05-04 ENCOUNTER — Other Ambulatory Visit: Payer: Self-pay | Admitting: Hematology

## 2016-05-05 ENCOUNTER — Ambulatory Visit (HOSPITAL_BASED_OUTPATIENT_CLINIC_OR_DEPARTMENT_OTHER): Payer: 59

## 2016-05-05 ENCOUNTER — Ambulatory Visit (HOSPITAL_BASED_OUTPATIENT_CLINIC_OR_DEPARTMENT_OTHER): Payer: 59 | Admitting: Hematology

## 2016-05-05 ENCOUNTER — Encounter: Payer: Self-pay | Admitting: Hematology

## 2016-05-05 ENCOUNTER — Other Ambulatory Visit (HOSPITAL_BASED_OUTPATIENT_CLINIC_OR_DEPARTMENT_OTHER): Payer: 59

## 2016-05-05 ENCOUNTER — Ambulatory Visit (HOSPITAL_BASED_OUTPATIENT_CLINIC_OR_DEPARTMENT_OTHER): Payer: Self-pay | Admitting: Oncology

## 2016-05-05 ENCOUNTER — Encounter: Payer: Self-pay | Admitting: *Deleted

## 2016-05-05 ENCOUNTER — Telehealth: Payer: Self-pay | Admitting: Hematology

## 2016-05-05 VITALS — BP 117/77 | HR 87 | Temp 98.2°F | Resp 18

## 2016-05-05 VITALS — BP 102/62 | HR 90 | Temp 97.7°F | Resp 18 | Ht 62.0 in | Wt 168.4 lb

## 2016-05-05 DIAGNOSIS — Z5111 Encounter for antineoplastic chemotherapy: Secondary | ICD-10-CM

## 2016-05-05 DIAGNOSIS — C20 Malignant neoplasm of rectum: Secondary | ICD-10-CM

## 2016-05-05 DIAGNOSIS — T7840XA Allergy, unspecified, initial encounter: Secondary | ICD-10-CM

## 2016-05-05 DIAGNOSIS — J069 Acute upper respiratory infection, unspecified: Secondary | ICD-10-CM | POA: Diagnosis not present

## 2016-05-05 DIAGNOSIS — C187 Malignant neoplasm of sigmoid colon: Secondary | ICD-10-CM

## 2016-05-05 DIAGNOSIS — C787 Secondary malignant neoplasm of liver and intrahepatic bile duct: Secondary | ICD-10-CM

## 2016-05-05 LAB — COMPREHENSIVE METABOLIC PANEL
ALT: 13 U/L (ref 0–55)
AST: 21 U/L (ref 5–34)
Albumin: 3.6 g/dL (ref 3.5–5.0)
Alkaline Phosphatase: 77 U/L (ref 40–150)
Anion Gap: 10 mEq/L (ref 3–11)
BUN: 12.3 mg/dL (ref 7.0–26.0)
CO2: 27 mEq/L (ref 22–29)
Calcium: 9.3 mg/dL (ref 8.4–10.4)
Chloride: 106 mEq/L (ref 98–109)
Creatinine: 0.8 mg/dL (ref 0.6–1.1)
EGFR: >90 mL/min/{1.73_m2} (ref >90)
Glucose: 77 mg/dl (ref 70–140)
Potassium: 3.9 mEq/L (ref 3.5–5.1)
Sodium: 143 mEq/L (ref 136–145)
Total Bilirubin: 1.26 mg/dL — ABNORMAL HIGH (ref 0.20–1.20)
Total Protein: 7.3 g/dL (ref 6.4–8.3)

## 2016-05-05 LAB — CBC WITH DIFFERENTIAL/PLATELET
BASO%: 1.5 % (ref 0.0–2.0)
Basophils Absolute: 0 10*3/uL (ref 0.0–0.1)
EOS%: 5.5 % (ref 0.0–7.0)
Eosinophils Absolute: 0.1 10*3/uL (ref 0.0–0.5)
HCT: 38.2 % (ref 34.8–46.6)
HGB: 12.5 g/dL (ref 11.6–15.9)
LYMPH%: 21.1 % (ref 14.0–49.7)
MCH: 28.6 pg (ref 25.1–34.0)
MCHC: 32.7 g/dL (ref 31.5–36.0)
MCV: 87.4 fL (ref 79.5–101.0)
MONO#: 0.2 10*3/uL (ref 0.1–0.9)
MONO%: 9.1 % (ref 0.0–14.0)
NEUT#: 1.5 10*3/uL (ref 1.5–6.5)
NEUT%: 62.8 % (ref 38.4–76.8)
Platelets: 222 10*3/uL (ref 145–400)
RBC: 4.37 10*6/uL (ref 3.70–5.45)
RDW: 15.1 % — ABNORMAL HIGH (ref 11.2–14.5)
WBC: 2.5 10*3/uL — ABNORMAL LOW (ref 3.9–10.3)
lymph#: 0.5 10*3/uL — ABNORMAL LOW (ref 0.9–3.3)

## 2016-05-05 MED ORDER — DEXTROSE 5 % IV SOLN
Freq: Once | INTRAVENOUS | Status: AC
  Administered 2016-05-05: 11:00:00 via INTRAVENOUS

## 2016-05-05 MED ORDER — SODIUM CHLORIDE 0.9 % IV SOLN
10.0000 mg | Freq: Once | INTRAVENOUS | Status: AC
  Administered 2016-05-05: 10 mg via INTRAVENOUS
  Filled 2016-05-05: qty 1

## 2016-05-05 MED ORDER — DIPHENHYDRAMINE HCL 50 MG/ML IJ SOLN
25.0000 mg | Freq: Once | INTRAMUSCULAR | Status: AC
  Administered 2016-05-05: 25 mg via INTRAVENOUS

## 2016-05-05 MED ORDER — PALONOSETRON HCL INJECTION 0.25 MG/5ML
INTRAVENOUS | Status: AC
  Filled 2016-05-05: qty 5

## 2016-05-05 MED ORDER — FAMOTIDINE IN NACL 20-0.9 MG/50ML-% IV SOLN
20.0000 mg | Freq: Two times a day (BID) | INTRAVENOUS | Status: DC
  Administered 2016-05-05: 20 mg via INTRAVENOUS

## 2016-05-05 MED ORDER — PALONOSETRON HCL INJECTION 0.25 MG/5ML
0.2500 mg | Freq: Once | INTRAVENOUS | Status: AC
  Administered 2016-05-05: 0.25 mg via INTRAVENOUS

## 2016-05-05 MED ORDER — METHYLPREDNISOLONE SODIUM SUCC 125 MG IJ SOLR
125.0000 mg | Freq: Once | INTRAMUSCULAR | Status: AC
  Administered 2016-05-05: 125 mg via INTRAVENOUS

## 2016-05-05 MED ORDER — OXALIPLATIN CHEMO INJECTION 100 MG/20ML
130.0000 mg/m2 | Freq: Once | INTRAVENOUS | Status: AC
  Administered 2016-05-05: 240 mg via INTRAVENOUS
  Filled 2016-05-05: qty 38

## 2016-05-05 NOTE — Progress Notes (Signed)
SYMPTOM MANAGEMENT CLINIC    Chief Complaint: Neuropathy following oxaliplatin administration  HPI:  Kaitlyn Morgan 50 y.o. female diagnosed with stage III B rectal adenocarcinoma seen today in the infusion room due to neuropathy following her oxaliplatin administration. This are first dose of oxaliplatin today. The patient had completed her entire infusion and after her IV was discontinued she reported that she felt numbness in her face and lips. The patient became very anxious and I was asked to see the patient. The patient's numbness started after the infusion was complete. She also reported shortness of breath and feeling as though she could not catch her breath very well. She reports being very cold. Denies chest pain, abdominal pain, nausea, and vomiting. Reports that her arms and legs feel heavy. The patient denies being anxious, but her daughter reports that she did not sleep well last night prior to her chemotherapy and that she is likely anxious. Vital signs were checked during this time and were noted to be at baseline.  Oncology History   Rectal cancer [facility])   Staging form: Colon and Rectum, AJCC 7th Edition     Clinical stage from 12/03/2015: Stage IIIB (T3, N1, M0) - Signed by Truitt Merle, MD on 12/18/2015       Rectal cancer ([place])   12/03/2015 Initial Diagnosis    Rectal cancer ([place])      12/03/2015 Procedure    Colonoscopy showed a partially obstructing tumor in the proximal rectum, its diameter measured 6 mm, biopsed      12/03/2015 Initial Biopsy    Rectum mass biopsy showed adenocarcinoma, moderately differentiated      12/10/2015 Imaging    CT abdomen and pelvis with contrast showed luminal narrowing and mural thickening in the proximal to mid rectum. There is a 6 mm short axis mesorectal lymph node suspicious for metastasis. There are several tiny indeterminate liver lesions       12/17/2015 Imaging    MRI pelvis with and without contrast showed a advanced T3 rectal  adenocarcinoma (7cm), N1 disease (at least 2 left-sided nasal rectal lymph nodes are seen measuring 6-7 mm), distance from tumor to the sphincter is 8 cm      12/17/2015 Imaging    CT chest with contrast showed no evidence of metastasis or other changes      12/30/2015 - 02/08/2016 Radiation Therapy    neoadjuvant radiation to rectal cancer       12/30/2015 - 02/08/2016 Chemotherapy    xeloda 1531m twice daily with concurent radiation, she tolerated well       03/21/2016 Surgery    Rectosigmoid segmental resection       03/21/2016 Pathology Results    Invasive well differentiated adenocarcinoma, 5cm,  margins are negative, LVI (-), 1 out of 27 nodes were negative.         Review of Systems  Constitutional: Positive for diaphoresis. Negative for chills, fever, malaise/fatigue and weight loss.  HENT: Negative for sore throat.        Facial and oral numbness.  Eyes: Negative.   Respiratory: Positive for shortness of breath. Negative for cough, wheezing and stridor.   Cardiovascular: Negative.   Gastrointestinal: Negative.   Genitourinary: Negative.   Musculoskeletal: Negative.   Skin: Negative.   Neurological: Positive for tingling and weakness.  Endo/Heme/Allergies: Negative.   Psychiatric/Behavioral: Negative.     Past Medical History:  Diagnosis Date  . Anemia    during pregnancy, not now.  . Arthritis  osteoarthritis-neck"some issues with cervical vertebrae"  . Atypical syncope    1 episode -never had follow up with cardiology-never any further episodes, did go to urgent care.  . Colon cancer [facility]    rectal cancer Radiation and oral chemo with radiation.  . Complication of anesthesia   . Family history of breast cancer   . Family history of uterine cancer   . Genital lesion, female    at times  . Heart burn    occ.  Marland Kitchen Hx of seasonal allergies   . Motion sickness   . PONV (postoperative nausea and vomiting)   . Umbilicus discharge     Past Surgical History:    Procedure Laterality Date  . 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 CESAREAN SECTION    . DIAGNOSTIC LAPAROSCOPY     with D and C and Ovarian cystectomy, tubal ligation done at this time '00  . DILATION AND CURETTAGE OF UTERUS    . TUBAL LIGATION    . XI ROBOTIC ASSISTED LOWER ANTERIOR RESECTION N/A 03/21/2016   Procedure: XI ROBOTIC ASSISTED LOWER ANTERIOR RESECTION;  Surgeon: Leighton Ruff, MD;  Location: [facility];  Service: General;  Laterality: N/A;    has Rectal cancer ([place]); Family history of breast cancer; Family history of uterine cancer; and Genetic testing on her problem list.    is allergic to codeine; morphine and related; sucralose; and zolpidem tartrate.    Medication List       Accurate as of 05/05/16  4:30 PM. Always use your most recent med list.          aluminum hydroxide-magnesium carbonate 95-358 MG/15ML Susp Commonly known as:  GAVISCON Take 15 mLs by mouth as needed.   capecitabine 150 MG tablet Commonly known as:  XELODA Take 2 tablets (300 mg total) by mouth 2 (two) times daily after a meal. Take on days 1-14 of chemotherapy.   capecitabine 500 MG tablet Commonly known as:  XELODA Take 3 tablets (1,500 mg total) by mouth 2 (two) times daily after a meal. Take on days 1-14 of chemotherapy.   estradiol 2 MG tablet Commonly known as:  ESTRACE TK 1 T PO QD   GINGER ROOT PO Take 1 capsule by mouth as needed.   ondansetron 8 MG tablet Commonly known as:  ZOFRAN Take 1 tablet (8 mg total) by mouth every 8 (eight) hours as needed for nausea.   prochlorperazine 10 MG tablet Commonly known as:  COMPAZINE Take 1 tablet (10 mg total) by mouth every 6 (six) hours as needed (Nausea or vomiting).   valACYclovir 500 MG tablet Commonly known as:  VALTREX take 1 tablet by mouth as needed when has outbreak   for BID x 3 days.        PHYSICAL EXAMINATION  Oncology Vitals 05/05/2016 05/05/2016  Height - 158 cm  Weight - 76.386  kg  Weight (lbs) - 168 lbs 6 oz  BMI (kg/m2) - 30.8 kg/m2  Temp 98.2 97.7  Pulse 87 90  Resp 18 18  SpO2 100 100  BSA (m2) - 1.83 m2   BP Readings from Last 2 Encounters:  05/05/16 117/77  05/05/16 102/62    Physical Exam  Constitutional: She is oriented to person, place, and time and well-developed, well-nourished, and in no distress.  HENT:  Head: Normocephalic and atraumatic.  Mouth/Throat: Oropharynx is clear and moist.  Eyes: EOM are normal. Pupils are equal, round, and reactive to  light. No scleral icterus.  Neck: Normal range of motion. Neck supple. No tracheal deviation present. No thyromegaly present.  Cardiovascular: Normal rate, regular rhythm and normal heart sounds.   Pulmonary/Chest: Effort normal and breath sounds normal. No stridor. No respiratory distress. She has no wheezes. She has no rales.  Abdominal: Soft. Bowel sounds are normal.  Musculoskeletal: Normal range of motion. She exhibits no edema.  Lymphadenopathy:    She has no cervical adenopathy.  Neurological: She is alert and oriented to person, place, and time.  Skin: Skin is warm. She is diaphoretic.  Psychiatric: Mood, memory, affect and judgment normal.    LABORATORY DATA:. Appointment on 05/05/2016  Component Date Value Ref Range Status  . WBC 05/05/2016 2.5* 3.9 - 10.3 10e3/uL Final  . NEUT# 05/05/2016 1.5  1.5 - 6.5 10e3/uL Final  . HGB 05/05/2016 12.5  11.6 - 15.9 g/dL Final  . HCT 05/05/2016 38.2  34.8 - 46.6 % Final  . Platelets 05/05/2016 222  145 - 400 10e3/uL Final  . MCV 05/05/2016 87.4  79.5 - 101.0 fL Final  . MCH 05/05/2016 28.6  25.1 - 34.0 pg Final  . MCHC 05/05/2016 32.7  31.5 - 36.0 g/dL Final  . RBC 05/05/2016 4.37  3.70 - 5.45 10e6/uL Final  . RDW 05/05/2016 15.1* 11.2 - 14.5 % Final  . lymph# 05/05/2016 0.5* 0.9 - 3.3 10e3/uL Final  . MONO# 05/05/2016 0.2  0.1 - 0.9 10e3/uL Final  . Eosinophils Absolute 05/05/2016 0.1  0.0 - 0.5 10e3/uL Final  . Basophils Absolute  05/05/2016 0.0  0.0 - 0.1 10e3/uL Final  . NEUT% 05/05/2016 62.8  38.4 - 76.8 % Final  . LYMPH% 05/05/2016 21.1  14.0 - 49.7 % Final  . MONO% 05/05/2016 9.1  0.0 - 14.0 % Final  . EOS% 05/05/2016 5.5  0.0 - 7.0 % Final  . BASO% 05/05/2016 1.5  0.0 - 2.0 % Final  . Sodium 05/05/2016 143  136 - 145 mEq/L Final  . Potassium 05/05/2016 3.9  3.5 - 5.1 mEq/L Final  . Chloride 05/05/2016 106  98 - 109 mEq/L Final  . CO2 05/05/2016 27  22 - 29 mEq/L Final  . Glucose 05/05/2016 77  70 - 140 mg/dl Final  . BUN 05/05/2016 12.3  7.0 - 26.0 mg/dL Final  . Creatinine 05/05/2016 0.8  0.6 - 1.1 mg/dL Final  . Total Bilirubin 05/05/2016 1.26* 0.20 - 1.20 mg/dL Final  . Alkaline Phosphatase 05/05/2016 77  40 - 150 U/L Final  . AST 05/05/2016 21  5 - 34 U/L Final  . ALT 05/05/2016 13  0 - 55 U/L Final  . Total Protein 05/05/2016 7.3  6.4 - 8.3 g/dL Final  . Albumin 05/05/2016 3.6  3.5 - 5.0 g/dL Final  . Calcium 05/05/2016 9.3  8.4 - 10.4 mg/dL Final  . Anion Gap 05/05/2016 10  3 - 11 mEq/L Final  . EGFR 05/05/2016 >90  >90 ml/min/1.73 m2 Final    RADIOGRAPHIC STUDIES: No results found.  ASSESSMENT/PLAN:    No problem-specific Assessment & Plan notes found for this encounter.  This is a 50 year old female with stage IIIB rectal adenocarcinoma here in our office today for her first dose of oxaliplatin. She developed numbness in her face and around her mouth immediately following the administration of the drug. She also reported being short of breath. Vital signs remained stable at all times. Neuropathy is likely due to the oxaliplatin, but cannot rule out a true chemotherapy reaction. A  peripheral IV was restarted and the patient was given Pepcid 20 mrem IV, Benadryl 25 mg IV, and Solu-Medrol 125 mg IV. The patient's report of shortness of breath resolved. She continued to have ongoing sensation of numbness around her mouth and face, but there were no worsening of the symptoms. Discussed with the patient  and her daughter that is likely a side effect of the medication and we may need to consider adjusting premedications prior to her next dose of chemotherapy. The patient was monitored for approximately 1 hour following the reports of numbness and shortness of breath and there were no recurrence of her symptoms. She was ambulated to the bathroom by nursing and did report some weakness in her legs, but she was able to walk without much help. Discussed findings with daughter who thinks that her mother may be anxious and that may be part of the symptoms that she is experiencing. The patient was discharged home with her daughter. She will receive chemotherapy follow-up call tomorrow. Discussed that she may go to the ER tonight if she develops shortness of breath, chest pain, or any difficulty breathing.  Dr. Burr Medico was updated regarding the patient's disposition.  Patient stated understanding of all instructions; and was in agreement with this plan of care. The patient knows to call the clinic with any problems, questions or concerns.   Total time spent with patient was 30 minutes;  with greater than 50 percent of that time spent in face to face counseling regarding patient's symptoms,  and coordination of care and follow up.   Mikey Bussing, NP 05/05/2016

## 2016-05-05 NOTE — Progress Notes (Signed)
1155:  Pt reports IV is sore.  IV flushes well but no blood return.  IV discontinued and restarted in left Ac  1345:  Pt reports her lips feel slightly numb.  Oxaliplatin infusion has finished and IV has been d/c'd.  Upon from return to restroom pt reports that her lips feel more numb and the she feels like she cant breathe well.  Altamese Dilling, NP called to see patient.  1400:  Kristen chairside.  Pt very anxious.  VSS but pt reports unable to move rt index finger and mouth still numb.  IV Pepcid, Benadryl and Solumedrol given.  Will continue to observe.  1515:  Discharge instructions reviewed with patient and daughter.  Pt alert and oriented and reports that symptoms are slightly better although drowsy from the Benadryl

## 2016-05-05 NOTE — Progress Notes (Signed)
Oncology Nurse Navigator Documentation  Oncology Nurse Navigator Flowsheets 05/05/2016  Navigator Location [facility]  Navigator Encounter Type Treatment  Telephone -  Abnormal Finding Date -  Confirmed Diagnosis Date -  Surgery Date -  Treatment Initiated Date 05/05/2016  Patient Visit Type MedOnc  Treatment Phase Active Tx--CAPOX   Barriers/Navigation Needs Education  Education Symptom Management--Discussed dose and how to take Xeloda; call for diarrhea and start Imodium before taking next dose of Xeloda  Interventions Education Method  Referrals -  Coordination of Care -  Education Method Verbal;Teach-back  Support Groups/Services -  Acuity Level 1  Time Spent with Patient 15  Met with patient and her daughter in tx area: was having difficult time with venous access today. Encouraged her to push fluids, especially 24 hours prior to treatment and try using stress ball to squeeze when watching TV to help veins stand out better. Discussed possibility of PICC line if next treatment serves to be this difficult as well.

## 2016-05-05 NOTE — Telephone Encounter (Signed)
GAVE PATIENT AVS REPORT AND APPOINTMENTS FOR October  °

## 2016-05-05 NOTE — Patient Instructions (Signed)
[place] Discharge Instructions for Patients Receiving Chemotherapy  Today you received the following chemotherapy agents:  Oxaliplatin  To help prevent nausea and vomiting after your treatment, we encourage you to take your nausea medication as prescribed.   If you develop nausea and vomiting that is not controlled by your nausea medication, call the clinic.   BELOW ARE SYMPTOMS THAT SHOULD BE REPORTED IMMEDIATELY:  *FEVER GREATER THAN 100.5 F  *CHILLS WITH OR WITHOUT FEVER  NAUSEA AND VOMITING THAT IS NOT CONTROLLED WITH YOUR NAUSEA MEDICATION  *UNUSUAL SHORTNESS OF BREATH  *UNUSUAL BRUISING OR BLEEDING  TENDERNESS IN MOUTH AND THROAT WITH OR WITHOUT PRESENCE OF ULCERS  *URINARY PROBLEMS  *BOWEL PROBLEMS  UNUSUAL RASH Items with * indicate a potential emergency and should be followed up as soon as possible.  Feel free to call the clinic you have any questions or concerns. The clinic phone number is [phone].  Please show the [facility] at check-in to the Emergency Department and triage nurse.  Oxaliplatin Injection What is this medicine? OXALIPLATIN (ox AL i PLA tin) is a chemotherapy drug. It targets fast dividing cells, like cancer cells, and causes these cells to die. This medicine is used to treat cancers of the colon and rectum, and many other cancers. This medicine may be used for other purposes; ask your health care provider or pharmacist if you have questions. What should I tell my health care provider before I take this medicine? They need to know if you have any of these conditions: -kidney disease -an unusual or allergic reaction to oxaliplatin, other chemotherapy, other medicines, foods, dyes, or preservatives -pregnant or trying to get pregnant -breast-feeding How should I use this medicine? This drug is given as an infusion into a vein. It is administered in a hospital or clinic by a specially trained health care  professional. Talk to your pediatrician regarding the use of this medicine in children. Special care may be needed. Overdosage: If you think you have taken too much of this medicine contact a poison control center or emergency room at once. NOTE: This medicine is only for you. Do not share this medicine with others. What if I miss a dose? It is important not to miss a dose. Call your doctor or health care professional if you are unable to keep an appointment. What may interact with this medicine? -medicines to increase blood counts like filgrastim, pegfilgrastim, sargramostim -probenecid -some antibiotics like amikacin, gentamicin, neomycin, polymyxin B, streptomycin, tobramycin -zalcitabine Talk to your doctor or health care professional before taking any of these medicines: -acetaminophen -aspirin -ibuprofen -ketoprofen -naproxen This list may not describe all possible interactions. Give your health care provider a list of all the medicines, herbs, non-prescription drugs, or dietary supplements you use. Also tell them if you smoke, drink alcohol, or use illegal drugs. Some items may interact with your medicine. What should I watch for while using this medicine? Your condition will be monitored carefully while you are receiving this medicine. You will need important blood work done while you are taking this medicine. This medicine can make you more sensitive to cold. Do not drink cold drinks or use ice. Cover exposed skin before coming in contact with cold temperatures or cold objects. When out in cold weather wear warm clothing and cover your mouth and nose to warm the air that goes into your lungs. Tell your doctor if you get sensitive to the cold. This drug may make you feel generally  unwell. This is not uncommon, as chemotherapy can affect healthy cells as well as cancer cells. Report any side effects. Continue your course of treatment even though you feel ill unless your doctor tells you  to stop. In some cases, you may be given additional medicines to help with side effects. Follow all directions for their use. Call your doctor or health care professional for advice if you get a fever, chills or sore throat, or other symptoms of a cold or flu. Do not treat yourself. This drug decreases your body's ability to fight infections. Try to avoid being around people who are sick. This medicine may increase your risk to bruise or bleed. Call your doctor or health care professional if you notice any unusual bleeding. Be careful brushing and flossing your teeth or using a toothpick because you may get an infection or bleed more easily. If you have any dental work done, tell your dentist you are receiving this medicine. Avoid taking products that contain aspirin, acetaminophen, ibuprofen, naproxen, or ketoprofen unless instructed by your doctor. These medicines may hide a fever. Do not become pregnant while taking this medicine. Women should inform their doctor if they wish to become pregnant or think they might be pregnant. There is a potential for serious side effects to an unborn child. Talk to your health care professional or pharmacist for more information. Do not breast-feed an infant while taking this medicine. Call your doctor or health care professional if you get diarrhea. Do not treat yourself. What side effects may I notice from receiving this medicine? Side effects that you should report to your doctor or health care professional as soon as possible: -allergic reactions like skin rash, itching or hives, swelling of the face, lips, or tongue -low blood counts - This drug may decrease the number of white blood cells, red blood cells and platelets. You may be at increased risk for infections and bleeding. -signs of infection - fever or chills, cough, sore throat, pain or difficulty passing urine -signs of decreased platelets or bleeding - bruising, pinpoint red spots on the skin, black,  tarry stools, nosebleeds -signs of decreased red blood cells - unusually weak or tired, fainting spells, lightheadedness -breathing problems -chest pain, pressure -cough -diarrhea -jaw tightness -mouth sores -nausea and vomiting -pain, swelling, redness or irritation at the injection site -pain, tingling, numbness in the hands or feet -problems with balance, talking, walking -redness, blistering, peeling or loosening of the skin, including inside the mouth -trouble passing urine or change in the amount of urine Side effects that usually do not require medical attention (report to your doctor or health care professional if they continue or are bothersome): -changes in vision -constipation -hair loss -loss of appetite -metallic taste in the mouth or changes in taste -stomach pain This list may not describe all possible side effects. Call your doctor for medical advice about side effects. You may report side effects to FDA at 1-800-FDA-1088. Where should I keep my medicine? This drug is given in a hospital or clinic and will not be stored at home. NOTE: This sheet is a summary. It may not cover all possible information. If you have questions about this medicine, talk to your doctor, pharmacist, or health care provider.    2016, [employer]vier/Gold Standard. (2008-02-26 17:22:47)

## 2016-05-05 NOTE — Progress Notes (Signed)
[place]  [phone] [phone]  Clinic follow Up Note   Patient Care Team: Truitt Merle, MD as Consulting Physician (Hematology) Kyung Rudd, MD as Consulting Physician (Radiation Oncology) Jackolyn Confer, MD as Consulting Physician (General Surgery) Richmond Campbell, MD as Consulting Physician (Gastroenterology) Milus Banister, MD as Attending Physician (Gastroenterology) Tania Ade, RN as Registered Nurse Leighton Ruff, MD as Consulting Physician (General Surgery) 05/05/2016   CHIEF COMPLAINTS:  Follow up rectal cancer  Oncology History   Rectal cancer [facility])   Staging form: Colon and Rectum, AJCC 7th Edition     Clinical stage from 12/03/2015: Stage IIIB (T3, N1, M0) - Signed by Truitt Merle, MD on 12/18/2015       Rectal cancer ([place])   12/03/2015 Initial Diagnosis    Rectal cancer ([place])      12/03/2015 Procedure    Colonoscopy showed a partially obstructing tumor in the proximal rectum, its diameter measured 6 mm, biopsed      12/03/2015 Initial Biopsy    Rectum mass biopsy showed adenocarcinoma, moderately differentiated      12/10/2015 Imaging    CT abdomen and pelvis with contrast showed luminal narrowing and mural thickening in the proximal to mid rectum. There is a 6 mm short axis mesorectal lymph node suspicious for metastasis. There are several tiny indeterminate liver lesions       12/17/2015 Imaging    MRI pelvis with and without contrast showed a advanced T3 rectal adenocarcinoma (7cm), N1 disease (at least 2 left-sided nasal rectal lymph nodes are seen measuring 6-7 mm), distance from tumor to the sphincter is 8 cm      12/17/2015 Imaging    CT chest with contrast showed no evidence of metastasis or other changes      12/30/2015 - 02/08/2016 Radiation Therapy    neoadjuvant radiation to rectal cancer       12/30/2015 - 02/08/2016 Chemotherapy    xeloda 157m twice daily with concurent radiation, she tolerated well       03/21/2016 Surgery    Rectosigmoid segmental resection       03/21/2016 Pathology Results    Invasive well differentiated adenocarcinoma, 5cm,  margins are negative, LVI (-), 1 out of 27 nodes were negative.         HISTORY OF PRESENTING ILLNESS:  Kaitlyn ADisbro50y.o. female is here because of Her newly diagnosed rectal cancer. She is accompanied by her daughter and her mother to our multidisciplinary GI clinic today.  She has had chronic abdominal pain for 2-3 years, in the epigastric area and low abdomen, which has been worse lately, and she noticed constipation, abdominal bloating and mild rectal bleeding for 2 months. She was referred to Dr. MEarlean Shawland underwent colonoscopy on 12/03/2015. A partial obstructive rectal mass was found in the proximal rectum, biopsy showed adenocarcinoma. She was referred to surgeon Dr. RZella Richerwho referred her to [place] consider new adjuvant chemoradiation.  She feels well overall, has mild fatigue, but able to tolerate her routine full-time job in daily activities without difficulties. She has good appetite, but eats less due to the epigastric pain after meal. She denies any other significant pain, cough, or other symptoms. No recent weight loss. She has been on phentermine for weight loss recently, and stopped a few days ago.  CURRENT THERAPY:  adjuvant chemotherapy with CAPOX, started on 05/05/2016  INTERIM HISTORY:  [name] for follow up and first cycle Of adjuvant chemotherapy. Her GI  symptoms as well resolved, but she has developed some cold symptoms with nasal congestion and a mild dry cough in the past few days, no fever, dyspnea, chest discomfort or other symptoms. Her appetite and energy level are good, she is ready to start chemotherapy.  MEDICAL HISTORY:  Past Medical History:  Diagnosis Date  . Anemia    during pregnancy, not now.  . 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 Atypical syncope     1 episode -never had follow up with cardiology-never any further episodes, did go to urgent care.  . Colon cancer [facility])    rectal cancer Radiation and oral chemo with radiation.  . Complication of anesthesia   . Family history of breast cancer   . Family history of uterine cancer   . Genital lesion, female    at times  . Heart burn    occ.  Marland Kitchen Hx of seasonal allergies   . Motion sickness   . PONV (postoperative nausea and vomiting)   . Umbilicus discharge     SURGICAL HISTORY: Past Surgical History:  Procedure Laterality Date  . 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 CESAREAN SECTION    . DIAGNOSTIC LAPAROSCOPY     with D and C and Ovarian cystectomy, tubal ligation done at this time '00  . DILATION AND CURETTAGE OF UTERUS    . TUBAL LIGATION    . XI ROBOTIC ASSISTED LOWER ANTERIOR RESECTION N/A 03/21/2016   Procedure: XI ROBOTIC ASSISTED LOWER ANTERIOR RESECTION;  Surgeon: Leighton Ruff, MD;  Location: [facility];  Service: General;  Laterality: N/A;    SOCIAL HISTORY: Social History   Social History  . Marital status: Legally Separated    Spouse name: N/A  . Number of children: 3  . Years of education: N/A   Occupational History  . Not on file.   Social History Main Topics  . Smoking status: Never Smoker  . Smokeless tobacco: Never Used  . Alcohol use No  . Drug use: No  . Sexual activity: Not on file   Other Topics Concern  . Not on file   Social History Narrative  . No narrative on file   She has 3 children, she lives with her twin sons who are 74. She works for [employer]   FAMILY HISTORY: Family History  Problem Relation Age of Onset  . Breast cancer Maternal Grandmother     dx in her 86s  . Cancer Paternal Grandmother 56    uterine cancer   . Hypertension Father   . COPD Father   . Hypertension Sister   . Hypertension Paternal Grandfather   . Heart attack Paternal Grandfather   . Hypertension Sister   . Skin  cancer Cousin     maternal first cousin    ALLERGIES:  is allergic to codeine; morphine and related; sucralose; and zolpidem tartrate.  MEDICATIONS:  Current Outpatient Prescriptions  Medication Sig Dispense Refill  . aluminum hydroxide-magnesium carbonate (GAVISCON) 95-358 MG/15ML SUSP Take 15 mLs by mouth as needed.    . capecitabine (XELODA) 150 MG tablet Take 2 tablets (300 mg total) by mouth 2 (two) times daily after a meal. Take on days 1-14 of chemotherapy. (Patient not taking: Reported on 04/27/2016) 56 tablet 3  . capecitabine (XELODA) 500 MG tablet Take 3 tablets (1,500 mg total) by mouth 2 (two) times daily after a meal. Take on days 1-14 of chemotherapy. (Patient  not taking: Reported on 04/27/2016) 84 tablet 3  . estradiol (ESTRACE) 2 MG tablet TK 1 T PO QD  4  . Ginger, Zingiber officinalis, (GINGER ROOT PO) Take 1 capsule by mouth as needed.     . ondansetron (ZOFRAN) 8 MG tablet Take 1 tablet (8 mg total) by mouth every 8 (eight) hours as needed for nausea. (Patient not taking: Reported on 04/27/2016) 30 tablet 3  . prochlorperazine (COMPAZINE) 10 MG tablet Take 1 tablet (10 mg total) by mouth every 6 (six) hours as needed (Nausea or vomiting). (Patient not taking: Reported on 04/27/2016) 30 tablet 1  . traMADol (ULTRAM) 50 MG tablet Take 1-2 tablets (50-100 mg total) by mouth every 6 (six) hours as needed for moderate pain or severe pain. (Patient not taking: Reported on 04/27/2016) 30 tablet 0  . valACYclovir (VALTREX) 500 MG tablet take 1 tablet by mouth as needed when has outbreak   for BID x 3 days.  4   No current facility-administered medications for this visit.     REVIEW OF SYSTEMS:   Constitutional: Denies fevers, chills or abnormal night sweats Eyes: Denies blurriness of vision, double vision or watery eyes Ears, nose, mouth, throat, and face: Denies mucositis or sore throat Respiratory: Denies cough, dyspnea or wheezes Cardiovascular: Denies palpitation, chest  discomfort or lower extremity swelling Gastrointestinal:  Denies nausea, heartburn or change in bowel habits Skin: Denies abnormal skin rashes Lymphatics: Denies new lymphadenopathy or easy bruising Neurological:Denies numbness, tingling or new weaknesses Behavioral/Psych: Mood is stable, no new changes  All other systems were reviewed with the patient and are negative.  PHYSICAL EXAMINATION: ECOG PERFORMANCE STATUS: 1 - Symptomatic but completely ambulatory  Vitals:   05/05/16 0907  BP: 102/62  Pulse: 90  Resp: 18  Temp: 97.7 F (36.5 C)   Filed Weights   05/05/16 0907  Weight: 168 lb 6.4 oz (76.4 kg)    GENERAL:alert, no distress and comfortable SKIN: skin color, texture, turgor are normal, no rashes or significant lesions EYES: normal, conjunctiva are pink and non-injected, sclera clear OROPHARYNX:no exudate, no erythema and lips, buccal mucosa, and tongue normal  NECK: supple, thyroid normal size, non-tender, without nodularity LYMPH:  no palpable lymphadenopathy in the cervical, axillary or inguinal LUNGS: clear to auscultation and percussion with normal breathing effort HEART: regular rate & rhythm and no murmurs and no lower extremity edema ABDOMEN:abdomen soft, non-tender and normal bowel sounds.  Musculoskeletal:no cyanosis of digits and no clubbing  PSYCH: alert & oriented x 3 with fluent speech NEURO: no focal motor/sensory deficits  LABORATORY DATA:  I have reviewed the data as listed CBC Latest Ref Rng & Units 05/05/2016 04/27/2016 03/26/2016  WBC 3.9 - 10.3 10e3/uL 2.5(L) 2.1(L) 4.7  Hemoglobin 11.6 - 15.9 g/dL 12.5 11.4(L) 9.6(L)  Hematocrit 34.8 - 46.6 % 38.2 34.5(L) 27.9(L)  Platelets 145 - 400 10e3/uL 222 229 183    CMP Latest Ref Rng & Units 05/05/2016 04/27/2016 03/26/2016  Glucose 70 - 140 mg/dl 77 80 86  BUN 7.0 - 26.0 mg/dL 12.3 6.2(L) 6  Creatinine 0.6 - 1.1 mg/dL 0.8 0.7 0.63  Sodium 136 - 145 mEq/L 143 144 139  Potassium 3.5 - 5.1 mEq/L 3.9 4.1  4.1  Chloride 101 - 111 mmol/L - - 105  CO2 22 - 29 mEq/L '27 27 29  ' Calcium 8.4 - 10.4 mg/dL 9.3 9.2 8.3(L)  Total Protein 6.4 - 8.3 g/dL 7.3 7.2 -  Total Bilirubin 0.20 - 1.20 mg/dL 1.26(H) 0.88 -  Alkaline Phos 40 - 150 U/L 77 71 -  AST 5 - 34 U/L 21 25 -  ALT 0 - 55 U/L 13 14 -     PATHOLOGY REPORT Diagnosis 12/03/2015 Colon, sigmoid, mass, biopsy -Invasive adenocarcinoma, moderately differentiated. See comment.  Comment: due to the presence of adenocarcinoma, IHC for DNA mismatch repair proteins will be performed and reported in an addendum.  Diagnosis 03/21/2016 1. Colon, segmental resection for tumor, rectosigmoid - INVASIVE WELL DIFFERENTIATED ADENOCARCINOMA, SPANNING 5 CM IN GREATEST DIMENSION. - TUMOR INVADES THROUGH MUSCULARIS PROPRIA TO INVOLVE SUBSEROSAL SOFT TISSUES. - MARGINS ARE NEGATIVE. - ONE OF TWENTY-SEVEN LYMPH NODES IS POSITIVE FOR METASTATIC ADENOCARCINOMA (1/27). - SEE ONCOLOGY TEMPLATE. 2. Colon, resection margin (donut), distal rectal, final margin - BENIGN COLORECTAL MUCOSA. - NO TUMOR SEEN. Microscopic Comment 1. COLON AND RECTUM (INCLUDING TRANS-ANAL RESECTION): Specimen: Rectosigmoid colon. Procedure: Robotic lower anterior resection. Tumor site: Rectum. Specimen integrity: Intact. Macroscopic intactness of mesorectum: Complete. Macroscopic tumor perforation: Not identified. Invasive tumor: Maximum size: 5 cm. Histologic type(s): Adenocarcinoma. Histologic grade and differentiation: G1: well differentiated/low grade. Type of polyp in which invasive carcinoma arose: Tubular adenoma with high grade dysplasia. Microscopic extension of invasive tumor: Tumor invades through muscularis propria to involve subserosal soft tissues. Lymph-Vascular invasion: Although definitive lymph/vascular invasion is not identified, there is lymph node positivity (see below). Peri-neural invasion: None identified. Tumor deposit(s) (discontinuous extramural extension):  None identified. Resection margins: Proximal margin: Negative. Distal margin: Negative. Circumferential (radial) (posterior ascending, posterior descending; lateral and posterior mid-rectum; and entire lower 1/3 rectum): Negative. Mesenteric margin: Not applicable. Distance closest margin (if all above margins negative): .2.4 cm (distal margin) Trans-anal resection margins only: Not applicable. Deep margin: Not applicable. Mucosal Margin: Not applicable. Distance closest mucosal margin (if negative): Not applicable. Treatment effect (neo-adjuvant therapy): Minimal treatment effect is present in the primary tumor. Minimal treatment effect is also present in the positive lymph nodes with the other lymph nodes showing no degree of treatment effect. Additional polyp(s): No additional polyps identified. Non-neoplastic findings: Diverticulosis. Lymph nodes: number examined 27; number positive: 1. Pathologic Staging: ypT3, ypN1a. Ancillary studies: As the patient is status-post neoadjuvant therapy, MSI by PCR and MMR by [facility] will not be sent.   RADIOGRAPHIC STUDIES: I have personally reviewed the radiological images as listed and agreed with the findings in the report. No results found. COLONOSCOPY DR. MEDOFF 12/03/2015  Impression Malignant partially obstructing tumor in the proximal rectum, its diameter measured 6 mm, biopsy. Distal margin at 15 cm.    ASSESSMENT & PLAN:  50 year old Caucasian female, without significant past medical history, presented with abdominal pain and rectal bleeding. Colonoscopy showed a obstructing tumor in the proximal rectum.  1. Rectal adenocarcinoma, proximal rectum, cT3N1M0, ypT3N1aM0, stage IIIB, moderately differentiated --I have reviewed her surgical path findings with pt in details -she has significant residual disease after neoadjuvant chemo and radiaiton -I discussed the high risk of cancer recurrence after surgery, I recommend adjuvant chemo -we  discussed the option of FOLFOX and CAPOX. She opted CAPOX  -Based on the recent adjuvant 3 months vs 6 months chemotherapy date (one trial included rectal cancel also) which was reported in ASCO 2017, I recommend short course CAPOX, a total of 3 cycles.  -Lab results reviewed, adequate for treatment, we'll proceed with first cycle CAPOX today, she will start Xeloda tonight.  2. Mild URI -She has developed a mild nasal congestion and dry cough, no fever, chills or other symptoms. -I encouraged her to drink warm  water adequately, and have good rest. Will monitoring.  -I encouraged her to have a flu shot after she recovers well.  3. Genetics -she was seen by genetic counselor, and her genetic test was negative for lynch syndrome or the following 32 genes: APC, ATM, AXIN2, BARD1, BMPR1A, BRCA1, BRCA2, BRIP1, CDH1, CDK4, CDKN2A, CHEK2, EPCAM, FANCC, MLH1, MSH2, MSH6, MUTYH, NBN, PALB2, PMS2, POLD1, POLE, PTEN, RAD51C, RAD51D, SCG5/GREM1, SMAD4, STK11, TP53, VHL, and XRCC2.    Plan -first cycle CAPOX today, she will start Xeloda tonight -Lab, follow-up and chemotherapy in 3 weeks  All questions were answered. The patient knows to call the clinic with any problems, questions or concerns.  I spent 15 minutes counseling the patient face to face. The total time spent in the appointment was 20 minutes and more than 50% was on counseling.     Truitt Merle, MD 05/05/2016

## 2016-05-08 ENCOUNTER — Encounter: Payer: Self-pay | Admitting: Hematology

## 2016-05-09 ENCOUNTER — Telehealth: Payer: Self-pay | Admitting: Medical Oncology

## 2016-05-09 NOTE — Telephone Encounter (Signed)
Kaitlyn Morgan reports pt was anxious on thursday after infusion. (Pt did have oxali reaction).  Kaitlyn Morgan reported that Kaitlyn Morgan" only took the pills the first day and she doesn't want to take anymore . Kaitlyn Morgan will call later today ".

## 2016-05-10 ENCOUNTER — Telehealth: Payer: Self-pay | Admitting: *Deleted

## 2016-05-10 ENCOUNTER — Other Ambulatory Visit: Payer: Self-pay | Admitting: Nurse Practitioner

## 2016-05-10 DIAGNOSIS — C20 Malignant neoplasm of rectum: Secondary | ICD-10-CM

## 2016-05-10 NOTE — Telephone Encounter (Signed)
Received vm call from pt stating that she has been sick since chemo on last thurs.  She reports having a bad reaction where she felt paralyzed, had difficulty breathing, & her tongue was messed up.  She states that she is unable to swallow, drink, & eat well due to pain.  She has also been unable to take her pills due to [place] pt & she is very scared & states she wants to stop of IV chemo.  She has missed some xeloda doses due to trouble swallowing.  She feels that this is a little better now but still has extreme cold sensitivity & has to heat everything.  She mentioned some slight diarrhea without much production but can easily mess up her underwear.  She started crying at the end of the conversation.  Assured her that this message would get to Dr Burr Medico & she may have some other options.  Message to Dr Burr Medico to call pt @ [phone].

## 2016-05-11 ENCOUNTER — Ambulatory Visit (HOSPITAL_BASED_OUTPATIENT_CLINIC_OR_DEPARTMENT_OTHER): Payer: 59

## 2016-05-11 ENCOUNTER — Other Ambulatory Visit: Payer: Self-pay | Admitting: Nurse Practitioner

## 2016-05-11 ENCOUNTER — Ambulatory Visit (HOSPITAL_BASED_OUTPATIENT_CLINIC_OR_DEPARTMENT_OTHER): Payer: 59 | Admitting: Nurse Practitioner

## 2016-05-11 ENCOUNTER — Encounter: Payer: Self-pay | Admitting: Nurse Practitioner

## 2016-05-11 VITALS — BP 106/69 | HR 65 | Temp 98.2°F | Resp 18 | Ht 62.0 in | Wt 160.7 lb

## 2016-05-11 DIAGNOSIS — C20 Malignant neoplasm of rectum: Secondary | ICD-10-CM

## 2016-05-11 DIAGNOSIS — E860 Dehydration: Secondary | ICD-10-CM

## 2016-05-11 DIAGNOSIS — R197 Diarrhea, unspecified: Secondary | ICD-10-CM | POA: Diagnosis not present

## 2016-05-11 DIAGNOSIS — R112 Nausea with vomiting, unspecified: Secondary | ICD-10-CM

## 2016-05-11 DIAGNOSIS — F419 Anxiety disorder, unspecified: Secondary | ICD-10-CM

## 2016-05-11 LAB — COMPREHENSIVE METABOLIC PANEL
ALT: 18 U/L (ref 0–55)
AST: 33 U/L (ref 5–34)
Albumin: 4 g/dL (ref 3.5–5.0)
Alkaline Phosphatase: 69 U/L (ref 40–150)
Anion Gap: 11 mEq/L (ref 3–11)
BUN: 11.5 mg/dL (ref 7.0–26.0)
CO2: 24 mEq/L (ref 22–29)
Calcium: 9.6 mg/dL (ref 8.4–10.4)
Chloride: 105 mEq/L (ref 98–109)
Creatinine: 0.8 mg/dL (ref 0.6–1.1)
EGFR: >90 mL/min/{1.73_m2} (ref >90)
Glucose: 80 mg/dl (ref 70–140)
Potassium: 3.8 mEq/L (ref 3.5–5.1)
Sodium: 140 mEq/L (ref 136–145)
Total Bilirubin: 1.71 mg/dL — ABNORMAL HIGH (ref 0.20–1.20)
Total Protein: 7.8 g/dL (ref 6.4–8.3)

## 2016-05-11 LAB — CBC WITH DIFFERENTIAL/PLATELET
BASO%: 0.4 % (ref 0.0–2.0)
Basophils Absolute: 0 10*3/uL (ref 0.0–0.1)
EOS%: 8.6 % — ABNORMAL HIGH (ref 0.0–7.0)
Eosinophils Absolute: 0.2 10*3/uL (ref 0.0–0.5)
HCT: 37.1 % (ref 34.8–46.6)
HGB: 12.4 g/dL (ref 11.6–15.9)
LYMPH%: 20.2 % (ref 14.0–49.7)
MCH: 28.4 pg (ref 25.1–34.0)
MCHC: 33.4 g/dL (ref 31.5–36.0)
MCV: 84.9 fL (ref 79.5–101.0)
MONO#: 0.3 10*3/uL (ref 0.1–0.9)
MONO%: 12.9 % (ref 0.0–14.0)
NEUT#: 1.4 10*3/uL — ABNORMAL LOW (ref 1.5–6.5)
NEUT%: 57.9 % (ref 38.4–76.8)
Platelets: 152 10*3/uL (ref 145–400)
RBC: 4.37 10*6/uL (ref 3.70–5.45)
RDW: 13.5 % (ref 11.2–14.5)
WBC: 2.3 10*3/uL — ABNORMAL LOW (ref 3.9–10.3)
lymph#: 0.5 10*3/uL — ABNORMAL LOW (ref 0.9–3.3)

## 2016-05-11 LAB — MAGNESIUM: Magnesium: 2.1 mg/dl (ref 1.5–2.5)

## 2016-05-11 MED ORDER — SODIUM CHLORIDE 0.9 % IV SOLN
INTRAVENOUS | Status: DC
  Administered 2016-05-11: 14:00:00 via INTRAVENOUS

## 2016-05-11 NOTE — Assessment & Plan Note (Signed)
Patient states that she has had some occasional diarrhea as well; and takes Imodium as directed.

## 2016-05-11 NOTE — Progress Notes (Signed)
SYMPTOM MANAGEMENT CLINIC    Chief Complaint: dehydration  HPI:  Kaitlyn Morgan 50 y.o. female diagnosed with rectal cancer.  Currently undergoing oxaliplatin chemotherapy and Xeloda oral therapy.   Oncology History   Rectal cancer [facility])   Staging form: Colon and Rectum, AJCC 7th Edition     Clinical stage from 12/03/2015: Stage IIIB (T3, N1, M0) - Signed by Truitt Merle, MD on 12/18/2015       Rectal cancer ([place])   12/03/2015 Initial Diagnosis    Rectal cancer ([place])      12/03/2015 Procedure    Colonoscopy showed a partially obstructing tumor in the proximal rectum, its diameter measured 6 mm, biopsed      12/03/2015 Initial Biopsy    Rectum mass biopsy showed adenocarcinoma, moderately differentiated      12/10/2015 Imaging    CT abdomen and pelvis with contrast showed luminal narrowing and mural thickening in the proximal to mid rectum. There is a 6 mm short axis mesorectal lymph node suspicious for metastasis. There are several tiny indeterminate liver lesions       12/17/2015 Imaging    MRI pelvis with and without contrast showed a advanced T3 rectal adenocarcinoma (7cm), N1 disease (at least 2 left-sided nasal rectal lymph nodes are seen measuring 6-7 mm), distance from tumor to the sphincter is 8 cm      12/17/2015 Imaging    CT chest with contrast showed no evidence of metastasis or other changes      12/30/2015 - 02/08/2016 Radiation Therapy    neoadjuvant radiation to rectal cancer       12/30/2015 - 02/08/2016 Chemotherapy    xeloda 1551m twice daily with concurent radiation, she tolerated well       03/21/2016 Surgery    Rectosigmoid segmental resection       03/21/2016 Pathology Results    Invasive well differentiated adenocarcinoma, 5cm,  margins are negative, LVI (-), 1 out of 27 nodes were negative.         Review of Systems  Constitutional: Positive for malaise/fatigue and weight loss.  Gastrointestinal: Positive for diarrhea, nausea and vomiting.   Psychiatric/Behavioral: The patient is nervous/anxious.   All other systems reviewed and are negative.   Past Medical History:  Diagnosis Date  . Anemia    during pregnancy, not now.  . 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 Atypical syncope    1 episode -never had follow up with cardiology-never any further episodes, did go to urgent care.  . Colon cancer ([facility]    rectal cancer Radiation and oral chemo with radiation.  . Complication of anesthesia   . Family history of breast cancer   . Family history of uterine cancer   . Genital lesion, female    at times  . Heart burn    occ.  .Marland KitchenHx of seasonal allergies   . Motion sickness   . PONV (postoperative nausea and vomiting)   . Umbilicus discharge     Past Surgical History:  Procedure Laterality Date  . 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 CESAREAN SECTION    . DIAGNOSTIC LAPAROSCOPY     with D and C and Ovarian cystectomy, tubal ligation done at this time '00  . DILATION AND CURETTAGE OF UTERUS    . TUBAL LIGATION    . XI ROBOTIC ASSISTED LOWER ANTERIOR RESECTION N/A 03/21/2016   Procedure: XI ROBOTIC ASSISTED LOWER ANTERIOR RESECTION;  Surgeon: Leighton Ruff, MD;  Location: [facility];  Service: General;  Laterality: N/A;    has Rectal cancer ([place]); Family history of breast cancer; Family history of uterine cancer; Genetic testing; Diarrhea; Dehydration; Nausea with vomiting; and Anxiety on her problem list.    is allergic to codeine; morphine and related; sucralose; and zolpidem tartrate.    Medication List       Accurate as of 05/11/16  4:58 PM. Always use your most recent med list.          aluminum hydroxide-magnesium carbonate 95-358 MG/15ML Susp Commonly known as:  GAVISCON Take 15 mLs by mouth as needed.   capecitabine 150 MG tablet Commonly known as:  XELODA Take 2 tablets (300 mg total) by mouth 2 (two) times daily after a meal. Take on days  1-14 of chemotherapy.   capecitabine 500 MG tablet Commonly known as:  XELODA Take 3 tablets (1,500 mg total) by mouth 2 (two) times daily after a meal. Take on days 1-14 of chemotherapy.   estradiol 2 MG tablet Commonly known as:  ESTRACE TK 1 T PO QD   GINGER ROOT PO Take 1 capsule by mouth as needed.   ondansetron 8 MG tablet Commonly known as:  ZOFRAN Take 1 tablet (8 mg total) by mouth every 8 (eight) hours as needed for nausea.   prochlorperazine 10 MG tablet Commonly known as:  COMPAZINE Take 1 tablet (10 mg total) by mouth every 6 (six) hours as needed (Nausea or vomiting).   valACYclovir 500 MG tablet Commonly known as:  VALTREX take 1 tablet by mouth as needed when has outbreak   for BID x 3 days.        PHYSICAL EXAMINATION  Oncology Vitals 05/11/2016 05/11/2016  Height - 158 cm  Weight - 72.893 kg  Weight (lbs) - 160 lbs 11 oz  BMI (kg/m2) - 29.39 kg/m2  Temp - 98.2  Pulse 65 98  Resp - 18  SpO2 100 93  BSA (m2) - 1.79 m2   BP Readings from Last 2 Encounters:  05/11/16 106/69  05/05/16 117/77    Physical Exam  Constitutional: She is oriented to person, place, and time and well-developed, well-nourished, and in no distress.  HENT:  Head: Normocephalic and atraumatic.  Mouth/Throat: Oropharynx is clear and moist.  Eyes: Conjunctivae and EOM are normal. Pupils are equal, round, and reactive to light. Right eye exhibits no discharge. Left eye exhibits no discharge. No scleral icterus.  Neck: Normal range of motion. Neck supple. No JVD present. No tracheal deviation present. No thyromegaly present.  Cardiovascular: Normal rate, regular rhythm, normal heart sounds and intact distal pulses.   Pulmonary/Chest: Effort normal and breath sounds normal. No respiratory distress. She has no wheezes. She has no rales. She exhibits no tenderness.  Abdominal: Soft. Bowel sounds are normal. She exhibits no distension and no mass. There is no tenderness. There is no  rebound and no guarding.  Musculoskeletal: Normal range of motion. She exhibits no edema or tenderness.  Lymphadenopathy:    She has no cervical adenopathy.  Neurological: She is alert and oriented to person, place, and time. Gait normal.  Skin: Skin is warm and dry. No rash noted. No erythema. No pallor.  Psychiatric:  Mildly anxious appearing  Nursing note and vitals reviewed.   LABORATORY DATA:. Appointment on 05/11/2016  Component Date Value Ref Range Status  . WBC 05/11/2016 2.3* 3.9 - 10.3 10e3/uL Final  . NEUT# 05/11/2016 1.4* 1.5 - 6.5 10e3/uL  Final  . HGB 05/11/2016 12.4  11.6 - 15.9 g/dL Final  . HCT 05/11/2016 37.1  34.8 - 46.6 % Final  . Platelets 05/11/2016 152  145 - 400 10e3/uL Final  . MCV 05/11/2016 84.9  79.5 - 101.0 fL Final  . MCH 05/11/2016 28.4  25.1 - 34.0 pg Final  . MCHC 05/11/2016 33.4  31.5 - 36.0 g/dL Final  . RBC 05/11/2016 4.37  3.70 - 5.45 10e6/uL Final  . RDW 05/11/2016 13.5  11.2 - 14.5 % Final  . lymph# 05/11/2016 0.5* 0.9 - 3.3 10e3/uL Final  . MONO# 05/11/2016 0.3  0.1 - 0.9 10e3/uL Final  . Eosinophils Absolute 05/11/2016 0.2  0.0 - 0.5 10e3/uL Final  . Basophils Absolute 05/11/2016 0.0  0.0 - 0.1 10e3/uL Final  . NEUT% 05/11/2016 57.9  38.4 - 76.8 % Final  . LYMPH% 05/11/2016 20.2  14.0 - 49.7 % Final  . MONO% 05/11/2016 12.9  0.0 - 14.0 % Final  . EOS% 05/11/2016 8.6* 0.0 - 7.0 % Final  . BASO% 05/11/2016 0.4  0.0 - 2.0 % Final  . Sodium 05/11/2016 140  136 - 145 mEq/L Final  . Potassium 05/11/2016 3.8  3.5 - 5.1 mEq/L Final  . Chloride 05/11/2016 105  98 - 109 mEq/L Final  . CO2 05/11/2016 24  22 - 29 mEq/L Final  . Glucose 05/11/2016 80  70 - 140 mg/dl Final  . BUN 05/11/2016 11.5  7.0 - 26.0 mg/dL Final  . Creatinine 05/11/2016 0.8  0.6 - 1.1 mg/dL Final  . Total Bilirubin 05/11/2016 1.71* 0.20 - 1.20 mg/dL Final  . Alkaline Phosphatase 05/11/2016 69  40 - 150 U/L Final  . AST 05/11/2016 33  5 - 34 U/L Final  . ALT 05/11/2016 18  0  - 55 U/L Final  . Total Protein 05/11/2016 7.8  6.4 - 8.3 g/dL Final  . Albumin 05/11/2016 4.0  3.5 - 5.0 g/dL Final  . Calcium 05/11/2016 9.6  8.4 - 10.4 mg/dL Final  . Anion Gap 05/11/2016 11  3 - 11 mEq/L Final  . EGFR 05/11/2016 >90  >90 ml/min/1.73 m2 Final  . Magnesium 05/11/2016 2.1  1.5 - 2.5 mg/dl Final    RADIOGRAPHIC STUDIES: No results found.  ASSESSMENT/PLAN:    Rectal cancer [facility]) Patient receivedher first cycle of oxaliplatin chemotherapy on 05/05/2016.  She reports that she developed an XL reaction during the infusion last week; with tongue numbness and jaw discomfort.  She states that those symptoms have greatly improved; but she still occasionally has tongue sensitivity in her mouth "draws up".  She also reports some intermittent jaw and dental pain as well.  She also reports that she began taking Xeloda oral therapy.  On Saturday, 05/07/2016; but has only been taking the Xeloda intermittently.  She thinks that she's only taken 3 tablets so far.  Patient admits to feeling very anxious and is questioning if she wants to continue with the present chemotherapy plan.  See further notes for details.  Patient's labs today were essentially stable; and vital signs were stable as well.  Patient was afebrile.  Patient is scheduled to return on 05/26/2016 for labs, visit, and chemotherapy.  Nausea with vomiting Patient states that she has had a few episodes of vomiting; and continues with intermittent nausea as well.  She denies any nausea the present time.  Diarrhea Patient states that she has had some occasional diarrhea as well; and takes Imodium as directed.  Dehydration Patient states she's  had minimal appetite and has decreased taste sensation as well.  She's had decreased oral intake and feels mildly dehydrated today.  Patient will receive IV fluid rehydration while at the cancer Center today.  Anxiety Patient receivedher first cycle of oxaliplatin chemotherapy on  05/05/2016.  She reports that she developed an XL reaction during the infusion last week; with tongue numbness and jaw discomfort.  She states that those symptoms have greatly improved; but she still occasionally has tongue sensitivity in her mouth "draws up".  She also reports some intermittent jaw and dental pain as well.  She also reports that she began taking Xeloda oral therapy.  On Saturday, 05/07/2016; but has only been taking the Xeloda intermittently.  She thinks that she's only taken 3 tablets so far.  Patient admits to feeling very anxious and is questioning if she wants to continue with the present chemotherapy plan. Patient states that she is considering holding any future oxaliplatin at this point.  Reviewed all findings with Dr. Burr Medico; and she advised that patient should continue her oral Xeloda for the time being.  She stated that she would discuss treatment options with the patient when patient returns for follow-up visit prior to her next cycle of chemotherapy.  Options to consider would be decreasing the oxaliplatin dosage or discontinue oxaliplatin completely in the future.patient was in agreement with this plan of care.  Patient was advised to call/return or go directly to the emergency department for any worsening symptoms in the future.  Whatsoever.  Patient's labs today were essentially stable; and vital signs were stable as well.  Patient was afebrile.  Patient is scheduled to return on 05/26/2016 for labs, visit, and chemotherapy.   Patient stated understanding of all instructions; and was in agreement with this plan of care. The patient knows to call the clinic with any problems, questions or concerns.   Total time spent with patient was 25 minutes;  with greater than 75 percent of that time spent in face to face counseling regarding patient's symptoms,  and coordination of care and follow up.  Disclaimer:This dictation was prepared with Dragon/digital dictation along  with [employer]. Any transcriptional errors that result from this process are unintentional.  Drue Second, NP 05/11/2016  Pt declined AVS. She did take home [facility] handout.

## 2016-05-11 NOTE — Assessment & Plan Note (Signed)
Patient states that she has had a few episodes of vomiting; and continues with intermittent nausea as well.  She denies any nausea the present time.

## 2016-05-11 NOTE — Assessment & Plan Note (Signed)
Patient receivedher first cycle of oxaliplatin chemotherapy on 05/05/2016.  She reports that she developed an XL reaction during the infusion last week; with tongue numbness and jaw discomfort.  She states that those symptoms have greatly improved; but she still occasionally has tongue sensitivity in her mouth "draws up".  She also reports some intermittent jaw and dental pain as well.  She also reports that she began taking Xeloda oral therapy.  On Saturday, 05/07/2016; but has only been taking the Xeloda intermittently.  She thinks that she's only taken 3 tablets so far.  Patient admits to feeling very anxious and is questioning if she wants to continue with the present chemotherapy plan. Patient states that she is considering holding any future oxaliplatin at this point.  Reviewed all findings with Dr. Burr Medico; and she advised that patient should continue her oral Xeloda for the time being.  She stated that she would discuss treatment options with the patient when patient returns for follow-up visit prior to her next cycle of chemotherapy.  Options to consider would be decreasing the oxaliplatin dosage or discontinue oxaliplatin completely in the future.patient was in agreement with this plan of care.  Patient was advised to call/return or go directly to the emergency department for any worsening symptoms in the future.  Whatsoever.  Patient's labs today were essentially stable; and vital signs were stable as well.  Patient was afebrile.  Patient is scheduled to return on 05/26/2016 for labs, visit, and chemotherapy.

## 2016-05-11 NOTE — Assessment & Plan Note (Signed)
Patient states she's had minimal appetite and has decreased taste sensation as well.  She's had decreased oral intake and feels mildly dehydrated today.  Patient will receive IV fluid rehydration while at the cancer Center today.

## 2016-05-11 NOTE — Assessment & Plan Note (Signed)
Patient receivedher first cycle of oxaliplatin chemotherapy on 05/05/2016.  She reports that she developed an XL reaction during the infusion last week; with tongue numbness and jaw discomfort.  She states that those symptoms have greatly improved; but she still occasionally has tongue sensitivity in her mouth "draws up".  She also reports some intermittent jaw and dental pain as well.  She also reports that she began taking Xeloda oral therapy.  On Saturday, 05/07/2016; but has only been taking the Xeloda intermittently.  She thinks that she's only taken 3 tablets so far.  Patient admits to feeling very anxious and is questioning if she wants to continue with the present chemotherapy plan.  See further notes for details.  Patient's labs today were essentially stable; and vital signs were stable as well.  Patient was afebrile.  Patient is scheduled to return on 05/26/2016 for labs, visit, and chemotherapy.

## 2016-05-18 ENCOUNTER — Telehealth: Payer: Self-pay | Admitting: Nurse Practitioner

## 2016-05-18 NOTE — Telephone Encounter (Signed)
Attempted to call the patient back to check in with her following her chemotherapy on 05/11/2016 in which she had a mild hypersensitivity reaction.  There was no answer; and was unable to leave a message since patient's voice mailbox was full.

## 2016-05-25 ENCOUNTER — Telehealth: Payer: Self-pay | Admitting: *Deleted

## 2016-05-25 NOTE — Telephone Encounter (Signed)
Pt called and left message wanting to cancel chemo infusion appt for 10/12.  Wanted to know if pt should keep office visit appt as scheduled.   Spoke with pt and was informed that pt had stopped taking  Xeloda on  05/14/16.   Stated she did not want any more chemo due to bad side effects.  Asked pt to keep visit appt as scheduled with Dr. Burr Medico on 05/26/16.  Informed pt that she can discuss further about her concerns with md at office visit.   Pt voiced understanding. Pt's    Phone     [phone].

## 2016-05-26 ENCOUNTER — Other Ambulatory Visit (HOSPITAL_BASED_OUTPATIENT_CLINIC_OR_DEPARTMENT_OTHER): Payer: 59

## 2016-05-26 ENCOUNTER — Encounter: Payer: Self-pay | Admitting: *Deleted

## 2016-05-26 ENCOUNTER — Telehealth: Payer: Self-pay | Admitting: Hematology

## 2016-05-26 ENCOUNTER — Ambulatory Visit (HOSPITAL_BASED_OUTPATIENT_CLINIC_OR_DEPARTMENT_OTHER): Payer: 59 | Admitting: Hematology

## 2016-05-26 ENCOUNTER — Ambulatory Visit: Payer: 59

## 2016-05-26 VITALS — BP 100/67 | HR 92 | Temp 98.7°F | Resp 17 | Ht 62.0 in | Wt 169.9 lb

## 2016-05-26 DIAGNOSIS — C20 Malignant neoplasm of rectum: Secondary | ICD-10-CM | POA: Diagnosis not present

## 2016-05-26 DIAGNOSIS — F419 Anxiety disorder, unspecified: Secondary | ICD-10-CM

## 2016-05-26 LAB — COMPREHENSIVE METABOLIC PANEL
ALT: 27 U/L (ref 0–55)
AST: 32 U/L (ref 5–34)
Albumin: 3.7 g/dL (ref 3.5–5.0)
Alkaline Phosphatase: 58 U/L (ref 40–150)
Anion Gap: 9 mEq/L (ref 3–11)
BUN: 7.7 mg/dL (ref 7.0–26.0)
CO2: 26 mEq/L (ref 22–29)
Calcium: 9.2 mg/dL (ref 8.4–10.4)
Chloride: 108 mEq/L (ref 98–109)
Creatinine: 0.7 mg/dL (ref 0.6–1.1)
EGFR: >90 mL/min/{1.73_m2} (ref >90)
Glucose: 76 mg/dl (ref 70–140)
Potassium: 3.9 mEq/L (ref 3.5–5.1)
Sodium: 143 mEq/L (ref 136–145)
Total Bilirubin: 0.59 mg/dL (ref 0.20–1.20)
Total Protein: 6.8 g/dL (ref 6.4–8.3)

## 2016-05-26 LAB — CBC WITH DIFFERENTIAL/PLATELET
BASO%: 1.6 % (ref 0.0–2.0)
Basophils Absolute: 0 10*3/uL (ref 0.0–0.1)
EOS%: 7.6 % — ABNORMAL HIGH (ref 0.0–7.0)
Eosinophils Absolute: 0.1 10*3/uL (ref 0.0–0.5)
HCT: 35.6 % (ref 34.8–46.6)
HGB: 11.7 g/dL (ref 11.6–15.9)
LYMPH%: 21.6 % (ref 14.0–49.7)
MCH: 28.3 pg (ref 25.1–34.0)
MCHC: 32.9 g/dL (ref 31.5–36.0)
MCV: 86 fL (ref 79.5–101.0)
MONO#: 0.3 10*3/uL (ref 0.1–0.9)
MONO%: 14.1 % — ABNORMAL HIGH (ref 0.0–14.0)
NEUT#: 1 10*3/uL — ABNORMAL LOW (ref 1.5–6.5)
NEUT%: 55.1 % (ref 38.4–76.8)
Platelets: 190 10*3/uL (ref 145–400)
RBC: 4.14 10*6/uL (ref 3.70–5.45)
RDW: 14.8 % — ABNORMAL HIGH (ref 11.2–14.5)
WBC: 1.9 10*3/uL — ABNORMAL LOW (ref 3.9–10.3)
lymph#: 0.4 10*3/uL — ABNORMAL LOW (ref 0.9–3.3)

## 2016-05-26 NOTE — Telephone Encounter (Signed)
Lvm advising appt 10/30 @ 1pm.

## 2016-05-26 NOTE — Progress Notes (Signed)
Oncology Nurse Navigator Documentation  Oncology Nurse Navigator Flowsheets 05/26/2016  Navigator Location [facility]  Navigator Encounter Type Follow-up Appt  Telephone -  Abnormal Finding Date -  Confirmed Diagnosis Date -  Surgery Date 03/21/2016  Treatment Initiated Date -  Patient Visit Type MedOnc  Treatment Phase Other--has decided to stop chemotherapy-wants to start exercising and juicing and change her diet  Barriers/Navigation Needs Education-  Education Symptom Management--occasional fecal incontinence  Interventions -  Referrals -  Coordination of Care -  Education Method Verbal--informed her that return to [facility] in [facility] could help improve this issue. She will call navigator if she decides to return to [place] with Patient 15  She has decided to stop all chemotherapy even when MD suggested only taking the Xeloda. She has followed a patient who had Stage III rectal cancer who "beat it" with diet and exercise. She says she is comfortable with her decision and will have no regrets about it. Her family is wanting her to continue chemotherapy. She has agreed to return in 3 weeks to discuss further. She is aware that a long delay could diminish the effectiveness of the chemotherapy. Provided emotional support and that it is Her decision, but we want it to be an informed decision.

## 2016-05-26 NOTE — Progress Notes (Signed)
[place]  [phone] [phone]  Clinic follow Up Note   Patient Care Team: Truitt Merle, MD as Consulting Physician (Hematology) Kyung Rudd, MD as Consulting Physician (Radiation Oncology) Jackolyn Confer, MD as Consulting Physician (General Surgery) Richmond Campbell, MD as Consulting Physician (Gastroenterology) Milus Banister, MD as Attending Physician (Gastroenterology) Tania Ade, RN as Registered Nurse Leighton Ruff, MD as Consulting Physician (General Surgery) 05/26/2016   CHIEF COMPLAINTS:  Follow up rectal cancer  Oncology History   Rectal cancer [facility])   Staging form: Colon and Rectum, AJCC 7th Edition     Clinical stage from 12/03/2015: Stage IIIB (T3, N1, M0) - Signed by Truitt Merle, MD on 12/18/2015       Rectal cancer ([place])   12/03/2015 Initial Diagnosis    Rectal cancer ([place])      12/03/2015 Procedure    Colonoscopy showed a partially obstructing tumor in the proximal rectum, its diameter measured 6 mm, biopsed      12/03/2015 Initial Biopsy    Rectum mass biopsy showed adenocarcinoma, moderately differentiated      12/10/2015 Imaging    CT abdomen and pelvis with contrast showed luminal narrowing and mural thickening in the proximal to mid rectum. There is a 6 mm short axis mesorectal lymph node suspicious for metastasis. There are several tiny indeterminate liver lesions       12/17/2015 Imaging    MRI pelvis with and without contrast showed a advanced T3 rectal adenocarcinoma (7cm), N1 disease (at least 2 left-sided nasal rectal lymph nodes are seen measuring 6-7 mm), distance from tumor to the sphincter is 8 cm      12/17/2015 Imaging    CT chest with contrast showed no evidence of metastasis or other changes      12/30/2015 - 02/08/2016 Radiation Therapy    neoadjuvant radiation to rectal cancer       12/30/2015 - 02/08/2016 Chemotherapy    xeloda 1522m twice daily with concurent radiation, she tolerated well       03/21/2016 Surgery    Rectosigmoid segmental resection       03/21/2016 Pathology Results    Invasive well differentiated adenocarcinoma, 5cm,  margins are negative, LVI (-), 1 out of 27 nodes were negative.        05/05/2016 -  Adjuvant Chemotherapy    Adjuvant CAPOX, she had facial numbness and SOB after she completed the first oxaliplatin infusion, and refused more oxaliplatin        HISTORY OF PRESENTING ILLNESS:  Kaitlyn Morgan 50y.o. female is here because of Her newly diagnosed rectal cancer. She is accompanied by her daughter and her mother to our multidisciplinary GI clinic today.  She has had chronic abdominal pain for 2-3 years, in the epigastric area and low abdomen, which has been worse lately, and she noticed constipation, abdominal bloating and mild rectal bleeding for 2 months. She was referred to Dr. MEarlean Shawland underwent colonoscopy on 12/03/2015. A partial obstructive rectal mass was found in the proximal rectum, biopsy showed adenocarcinoma. She was referred to surgeon Dr. RZella Richerwho referred her to [place] consider new adjuvant chemoradiation.  She feels well overall, has mild fatigue, but able to tolerate her routine full-time job in daily activities without difficulties. She has good appetite, but eats less due to the epigastric pain after meal. She denies any other significant pain, cough, or other symptoms. No recent weight loss. She has been on phentermine for weight loss recently, and stopped  a few days ago.  CURRENT THERAPY:  adjuvant chemotherapy with CAPOX, started on 05/05/2016  INTERIM HISTORY:  Kaitlyn Morgan returns for follow up. She developed facial numbness, mild shortness of breath, and leg weakness after she completed oxaliplatin infusion 3  weeks ago, she was treated as infusion reaction, and released home afterwards. She still had residual leg weakness, occasional facial twitch, nausea and low appetite for 4-5 days after, and decided not to take  chemotherapy anymore. She only took Xeloda for a few days. She has recovered well, denies any numbness, pain or weakness. She has mild fatigue.   MEDICAL HISTORY:  Past Medical History:  Diagnosis Date  . Anemia    during pregnancy, not now.  . 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 Atypical syncope    1 episode -never had follow up with cardiology-never any further episodes, did go to urgent care.  . Colon cancer [facility])    rectal cancer Radiation and oral chemo with radiation.  . Complication of anesthesia   . Family history of breast cancer   . Family history of uterine cancer   . Genital lesion, female    at times  . Heart burn    occ.  Marland Kitchen Hx of seasonal allergies   . Motion sickness   . PONV (postoperative nausea and vomiting)   . Umbilicus discharge     SURGICAL HISTORY: Past Surgical History:  Procedure Laterality Date  . 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 CESAREAN SECTION    . DIAGNOSTIC LAPAROSCOPY     with D and C and Ovarian cystectomy, tubal ligation done at this time '00  . DILATION AND CURETTAGE OF UTERUS    . TUBAL LIGATION    . XI ROBOTIC ASSISTED LOWER ANTERIOR RESECTION N/A 03/21/2016   Procedure: XI ROBOTIC ASSISTED LOWER ANTERIOR RESECTION;  Surgeon: Leighton Ruff, MD;  Location: [facility];  Service: General;  Laterality: N/A;    SOCIAL HISTORY: Social History   Social History  . Marital status: Legally Separated    Spouse name: N/A  . Number of children: 3  . Years of education: N/A   Occupational History  . Not on file.   Social History Main Topics  . Smoking status: Never Smoker  . Smokeless tobacco: Never Used  . Alcohol use No  . Drug use: No  . Sexual activity: Not on file   Other Topics Concern  . Not on file   Social History Narrative  . No narrative on file   She has 3 children, she lives with her twin sons who are 15. She works for [employer]   FAMILY  HISTORY: Family History  Problem Relation Age of Onset  . Breast cancer Maternal Grandmother     dx in her 2s  . Cancer Paternal Grandmother 2    uterine cancer   . Hypertension Father   . COPD Father   . Hypertension Sister   . Hypertension Paternal Grandfather   . Heart attack Paternal Grandfather   . Hypertension Sister   . Skin cancer Cousin     maternal first cousin    ALLERGIES:  is allergic to codeine; morphine and related; sucralose; and zolpidem tartrate.  MEDICATIONS:  Current Outpatient Prescriptions  Medication Sig Dispense Refill  . aluminum hydroxide-magnesium carbonate (GAVISCON) 95-358 MG/15ML SUSP Take 15 mLs by mouth as needed.    . capecitabine (XELODA) 150 MG tablet Take 2  tablets (300 mg total) by mouth 2 (two) times daily after a meal. Take on days 1-14 of chemotherapy. 56 tablet 3  . capecitabine (XELODA) 500 MG tablet Take 3 tablets (1,500 mg total) by mouth 2 (two) times daily after a meal. Take on days 1-14 of chemotherapy. 84 tablet 3  . estradiol (ESTRACE) 2 MG tablet TK 1 T PO QD  4  . Ginger, Zingiber officinalis, (GINGER ROOT PO) Take 1 capsule by mouth as needed.     . ondansetron (ZOFRAN) 8 MG tablet Take 1 tablet (8 mg total) by mouth every 8 (eight) hours as needed for nausea. 30 tablet 3  . prochlorperazine (COMPAZINE) 10 MG tablet Take 1 tablet (10 mg total) by mouth every 6 (six) hours as needed (Nausea or vomiting). 30 tablet 1  . valACYclovir (VALTREX) 500 MG tablet take 1 tablet by mouth as needed when has outbreak   for BID x 3 days.  4   No current facility-administered medications for this visit.     REVIEW OF SYSTEMS:   Constitutional: Denies fevers, chills or abnormal night sweats Eyes: Denies blurriness of vision, double vision or watery eyes Ears, nose, mouth, throat, and face: Denies mucositis or sore throat Respiratory: Denies cough, dyspnea or wheezes Cardiovascular: Denies palpitation, chest discomfort or lower extremity  swelling Gastrointestinal:  Denies nausea, heartburn or change in bowel habits Skin: Denies abnormal skin rashes Lymphatics: Denies new lymphadenopathy or easy bruising Neurological:Denies numbness, tingling or new weaknesses Behavioral/Psych: Mood is stable, no new changes  All other systems were reviewed with the patient and are negative.  PHYSICAL EXAMINATION: ECOG PERFORMANCE STATUS: 1 - Symptomatic but completely ambulatory  Vitals:   05/26/16 0922  BP: 100/67  Pulse: 92  Resp: 17  Temp: 98.7 F (37.1 C)   Filed Weights   05/26/16 0922  Weight: 169 lb 14.4 oz (77.1 kg)    GENERAL:alert, no distress and comfortable SKIN: skin color, texture, turgor are normal, no rashes or significant lesions EYES: normal, conjunctiva are pink and non-injected, sclera clear OROPHARYNX:no exudate, no erythema and lips, buccal mucosa, and tongue normal  NECK: supple, thyroid normal size, non-tender, without nodularity LYMPH:  no palpable lymphadenopathy in the cervical, axillary or inguinal LUNGS: clear to auscultation and percussion with normal breathing effort HEART: regular rate & rhythm and no murmurs and no lower extremity edema ABDOMEN:abdomen soft, non-tender and normal bowel sounds.  Musculoskeletal:no cyanosis of digits and no clubbing  PSYCH: alert & oriented x 3 with fluent speech NEURO: no focal motor/sensory deficits  LABORATORY DATA:  I have reviewed the data as listed CBC Latest Ref Rng & Units 05/26/2016 05/11/2016 05/05/2016  WBC 3.9 - 10.3 10e3/uL 1.9(L) 2.3(L) 2.5(L)  Hemoglobin 11.6 - 15.9 g/dL 11.7 12.4 12.5  Hematocrit 34.8 - 46.6 % 35.6 37.1 38.2  Platelets 145 - 400 10e3/uL 190 152 222    CMP Latest Ref Rng & Units 05/26/2016 05/11/2016 05/05/2016  Glucose 70 - 140 mg/dl 76 80 77  BUN 7.0 - 26.0 mg/dL 7.7 11.5 12.3  Creatinine 0.6 - 1.1 mg/dL 0.7 0.8 0.8  Sodium 136 - 145 mEq/L 143 140 143  Potassium 3.5 - 5.1 mEq/L 3.9 3.8 3.9  Chloride 101 - 111 mmol/L - - -   CO2 22 - 29 mEq/L '26 24 27  ' Calcium 8.4 - 10.4 mg/dL 9.2 9.6 9.3  Total Protein 6.4 - 8.3 g/dL 6.8 7.8 7.3  Total Bilirubin 0.20 - 1.20 mg/dL 0.59 1.71(H) 1.26(H)  Alkaline Phos  40 - 150 U/L 58 69 77  AST 5 - 34 U/L 32 33 21  ALT 0 - 55 U/L '27 18 13     ' PATHOLOGY REPORT Diagnosis 12/03/2015 Colon, sigmoid, mass, biopsy -Invasive adenocarcinoma, moderately differentiated. See comment.  Comment: due to the presence of adenocarcinoma, IHC for DNA mismatch repair proteins will be performed and reported in an addendum.  Diagnosis 03/21/2016 1. Colon, segmental resection for tumor, rectosigmoid - INVASIVE WELL DIFFERENTIATED ADENOCARCINOMA, SPANNING 5 CM IN GREATEST DIMENSION. - TUMOR INVADES THROUGH MUSCULARIS PROPRIA TO INVOLVE SUBSEROSAL SOFT TISSUES. - MARGINS ARE NEGATIVE. - ONE OF TWENTY-SEVEN LYMPH NODES IS POSITIVE FOR METASTATIC ADENOCARCINOMA (1/27). - SEE ONCOLOGY TEMPLATE. 2. Colon, resection margin (donut), distal rectal, final margin - BENIGN COLORECTAL MUCOSA. - NO TUMOR SEEN. Microscopic Comment 1. COLON AND RECTUM (INCLUDING TRANS-ANAL RESECTION): Specimen: Rectosigmoid colon. Procedure: Robotic lower anterior resection. Tumor site: Rectum. Specimen integrity: Intact. Macroscopic intactness of mesorectum: Complete. Macroscopic tumor perforation: Not identified. Invasive tumor: Maximum size: 5 cm. Histologic type(s): Adenocarcinoma. Histologic grade and differentiation: G1: well differentiated/low grade. Type of polyp in which invasive carcinoma arose: Tubular adenoma with high grade dysplasia. Microscopic extension of invasive tumor: Tumor invades through muscularis propria to involve subserosal soft tissues. Lymph-Vascular invasion: Although definitive lymph/vascular invasion is not identified, there is lymph node positivity (see below). Peri-neural invasion: None identified. Tumor deposit(s) (discontinuous extramural extension): None identified. Resection  margins: Proximal margin: Negative. Distal margin: Negative. Circumferential (radial) (posterior ascending, posterior descending; lateral and posterior mid-rectum; and entire lower 1/3 rectum): Negative. Mesenteric margin: Not applicable. Distance closest margin (if all above margins negative): .2.4 cm (distal margin) Trans-anal resection margins only: Not applicable. Deep margin: Not applicable. Mucosal Margin: Not applicable. Distance closest mucosal margin (if negative): Not applicable. Treatment effect (neo-adjuvant therapy): Minimal treatment effect is present in the primary tumor. Minimal treatment effect is also present in the positive lymph nodes with the other lymph nodes showing no degree of treatment effect. Additional polyp(s): No additional polyps identified. Non-neoplastic findings: Diverticulosis. Lymph nodes: number examined 27; number positive: 1. Pathologic Staging: ypT3, ypN1a. Ancillary studies: As the patient is status-post neoadjuvant therapy, MSI by PCR and MMR by [facility] will not be sent.   RADIOGRAPHIC STUDIES: I have personally reviewed the radiological images as listed and agreed with the findings in the report. No results found. COLONOSCOPY DR. MEDOFF 12/03/2015  Impression Malignant partially obstructing tumor in the proximal rectum, its diameter measured 6 mm, biopsy. Distal margin at 15 cm.    ASSESSMENT & PLAN:  50 year old Caucasian female, without significant past medical history, presented with abdominal pain and rectal bleeding. Colonoscopy showed a obstructing tumor in the proximal rectum.  1. Rectal adenocarcinoma, proximal rectum, cT3N1M0, ypT3N1aM0, stage IIIB, moderately differentiated --I have reviewed her surgical path findings with pt in details -she has significant residual disease after neoadjuvant chemo and radiaiton -I discussed the high risk of cancer recurrence after surgery, I recommend adjuvant chemo -we discussed the option of FOLFOX  and CAPOX. She opted CAPOX  -She has some neurological symptoms after oxaliplatin infusion, and refuses to try again with premedication. She states she is not going to regret even if she had cancer recurrence -I encouraged her to consider single agent Xeloda, which she took with radiation before. She is reluctant, but also think about it.  3. Genetics -she was seen by genetic counselor, and her genetic test was negative for lynch syndrome or the following 32 genes: APC, ATM, AXIN2, BARD1, BMPR1A, BRCA1, BRCA2,  BRIP1, CDH1, CDK4, CDKN2A, CHEK2, EPCAM, FANCC, MLH1, MSH2, MSH6, MUTYH, NBN, PALB2, PMS2, POLD1, POLE, PTEN, RAD51C, RAD51D, SCG5/GREM1, SMAD4, STK11, TP53, VHL, and XRCC2.    Plan -cancel chemo today -RTC on 10/30 for follow up and decide about adjuvant xeloda   All questions were answered. The patient knows to call the clinic with any problems, questions or concerns.  I spent 20 minutes counseling the patient face to face. The total time spent in the appointment was 25 minutes and more than 50% was on counseling.     Truitt Merle, MD 05/26/2016

## 2016-05-29 ENCOUNTER — Encounter: Payer: Self-pay | Admitting: Hematology

## 2016-05-30 ENCOUNTER — Telehealth: Payer: Self-pay | Admitting: Medical Oncology

## 2016-05-30 NOTE — Telephone Encounter (Signed)
Dr. Burr Medico notified of pt's call.   Attempted to call pt back unsuccessfully.  Voice mail full , and not accepting new messages. Per Dr. Burr Medico, just monitor for now.  If pt noticed any more bleeding episodes, pt needs to call office back SOON.  Pt might need to come in for further evaluation.

## 2016-05-30 NOTE — Telephone Encounter (Signed)
Reports " fresh blood in toilet water this am"  after normal BM of brown stool. She denies dizziness, light headed or pain. This is the first blood she has seen since her surgery in Aug and she did not know who to call. Last chemo 9/12-chemo cancelled 10/12. She stated "it is right much at the bottom of the toilet". She also said the water was not bloody.I told her I would talk to Dr Burr Medico and call her back.

## 2016-05-30 NOTE — Telephone Encounter (Signed)
Had another BM and had very little blood -it was hard to see. I instructed her on Dr Lewayne Bunting note.

## 2016-06-13 ENCOUNTER — Other Ambulatory Visit (HOSPITAL_BASED_OUTPATIENT_CLINIC_OR_DEPARTMENT_OTHER): Payer: 59

## 2016-06-13 ENCOUNTER — Encounter: Payer: Self-pay | Admitting: Hematology

## 2016-06-13 ENCOUNTER — Telehealth: Payer: Self-pay | Admitting: Hematology

## 2016-06-13 ENCOUNTER — Ambulatory Visit (HOSPITAL_BASED_OUTPATIENT_CLINIC_OR_DEPARTMENT_OTHER): Payer: 59 | Admitting: Hematology

## 2016-06-13 VITALS — BP 104/73 | HR 67 | Temp 97.9°F | Resp 16 | Ht 62.0 in | Wt 167.7 lb

## 2016-06-13 DIAGNOSIS — C20 Malignant neoplasm of rectum: Secondary | ICD-10-CM

## 2016-06-13 DIAGNOSIS — R5383 Other fatigue: Secondary | ICD-10-CM

## 2016-06-13 DIAGNOSIS — F419 Anxiety disorder, unspecified: Secondary | ICD-10-CM

## 2016-06-13 LAB — CBC WITH DIFFERENTIAL/PLATELET
BASO%: 0.9 % (ref 0.0–2.0)
Basophils Absolute: 0 10*3/uL (ref 0.0–0.1)
EOS%: 2.3 % (ref 0.0–7.0)
Eosinophils Absolute: 0.1 10*3/uL (ref 0.0–0.5)
HCT: 37.9 % (ref 34.8–46.6)
HGB: 12.4 g/dL (ref 11.6–15.9)
LYMPH%: 21.2 % (ref 14.0–49.7)
MCH: 27.9 pg (ref 25.1–34.0)
MCHC: 32.7 g/dL (ref 31.5–36.0)
MCV: 85.2 fL (ref 79.5–101.0)
MONO#: 0.3 10*3/uL (ref 0.1–0.9)
MONO%: 14.4 % — ABNORMAL HIGH (ref 0.0–14.0)
NEUT#: 1.4 10*3/uL — ABNORMAL LOW (ref 1.5–6.5)
NEUT%: 61.2 % (ref 38.4–76.8)
Platelets: 226 10*3/uL (ref 145–400)
RBC: 4.44 10*6/uL (ref 3.70–5.45)
RDW: 17.2 % — ABNORMAL HIGH (ref 11.2–14.5)
WBC: 2.3 10*3/uL — ABNORMAL LOW (ref 3.9–10.3)
lymph#: 0.5 10*3/uL — ABNORMAL LOW (ref 0.9–3.3)

## 2016-06-13 LAB — COMPREHENSIVE METABOLIC PANEL
ALT: 12 U/L (ref 0–55)
AST: 23 U/L (ref 5–34)
Albumin: 4 g/dL (ref 3.5–5.0)
Alkaline Phosphatase: 66 U/L (ref 40–150)
Anion Gap: 10 mEq/L (ref 3–11)
BUN: 9.5 mg/dL (ref 7.0–26.0)
CO2: 26 mEq/L (ref 22–29)
Calcium: 9.7 mg/dL (ref 8.4–10.4)
Chloride: 106 mEq/L (ref 98–109)
Creatinine: 0.8 mg/dL (ref 0.6–1.1)
EGFR: >90 mL/min/{1.73_m2} (ref >90)
Glucose: 76 mg/dl (ref 70–140)
Potassium: 4 mEq/L (ref 3.5–5.1)
Sodium: 143 mEq/L (ref 136–145)
Total Bilirubin: 0.8 mg/dL (ref 0.20–1.20)
Total Protein: 7.5 g/dL (ref 6.4–8.3)

## 2016-06-13 NOTE — Telephone Encounter (Signed)
Appointments scheduled per 10/30 LOS. Patient given AVS report and calendars of next scheduled appointments.  °

## 2016-06-13 NOTE — Progress Notes (Signed)
[place]  [phone] [phone]  Clinic follow Up Note   Patient Care Team: Truitt Merle, MD as Consulting Physician (Hematology) Kyung Rudd, MD as Consulting Physician (Radiation Oncology) Jackolyn Confer, MD as Consulting Physician (General Surgery) Richmond Campbell, MD as Consulting Physician (Gastroenterology) Milus Banister, MD as Attending Physician (Gastroenterology) Tania Ade, RN as Registered Nurse Leighton Ruff, MD as Consulting Physician (General Surgery) 06/13/2016   CHIEF COMPLAINTS:  Follow up rectal cancer  Oncology History   Rectal cancer [facility])   Staging form: Colon and Rectum, AJCC 7th Edition     Clinical stage from 12/03/2015: Stage IIIB (T3, N1, M0) - Signed by Truitt Merle, MD on 12/18/2015       Rectal cancer ([place])   12/03/2015 Initial Diagnosis    Rectal cancer ([place])      12/03/2015 Procedure    Colonoscopy showed a partially obstructing tumor in the proximal rectum, its diameter measured 6 mm, biopsed      12/03/2015 Initial Biopsy    Rectum mass biopsy showed adenocarcinoma, moderately differentiated      12/10/2015 Imaging    CT abdomen and pelvis with contrast showed luminal narrowing and mural thickening in the proximal to mid rectum. There is a 6 mm short axis mesorectal lymph node suspicious for metastasis. There are several tiny indeterminate liver lesions       12/17/2015 Imaging    MRI pelvis with and without contrast showed a advanced T3 rectal adenocarcinoma (7cm), N1 disease (at least 2 left-sided nasal rectal lymph nodes are seen measuring 6-7 mm), distance from tumor to the sphincter is 8 cm      12/17/2015 Imaging    CT chest with contrast showed no evidence of metastasis or other changes      12/30/2015 - 02/08/2016 Radiation Therapy    neoadjuvant radiation to rectal cancer       12/30/2015 - 02/08/2016 Chemotherapy    xeloda 1541m twice daily with concurent radiation, she tolerated well       03/21/2016 Surgery    Rectosigmoid segmental resection       03/21/2016 Pathology Results    Invasive well differentiated adenocarcinoma, 5cm,  margins are negative, LVI (-), 1 out of 27 nodes were negative.        05/05/2016 -  Adjuvant Chemotherapy    Adjuvant CAPOX, she had facial numbness and SOB after she completed the first oxaliplatin infusion, and refused more oxaliplatin        HISTORY OF PRESENTING ILLNESS:  Kaitlyn Morgan 50y.o. female is here because of Her newly diagnosed rectal cancer. She is accompanied by her daughter and her mother to our multidisciplinary GI clinic today.  She has had chronic abdominal pain for 2-3 years, in the epigastric area and low abdomen, which has been worse lately, and she noticed constipation, abdominal bloating and mild rectal bleeding for 2 months. She was referred to Dr. MEarlean Shawland underwent colonoscopy on 12/03/2015. A partial obstructive rectal mass was found in the proximal rectum, biopsy showed adenocarcinoma. She was referred to surgeon Dr. RZella Richerwho referred her to [place] consider new adjuvant chemoradiation.  She feels well overall, has mild fatigue, but able to tolerate her routine full-time job in daily activities without difficulties. She has good appetite, but eats less due to the epigastric pain after meal. She denies any other significant pain, cough, or other symptoms. No recent weight loss. She has been on phentermine for weight loss recently, and stopped  a few days ago.  CURRENT THERAPY:  Observation  INTERIM HISTORY:  [name] returns for follow up. She has been recovering from her last cycle chemotherapy slowly, still quite fatigued, although she is able to go to work, and take a walk sometime. She occasionally feels retired, has difficulty to get out of chair, no significant numbness or tingling of her hands or fingers. Her appetite has recovered well, she is eating well. No other new complaints.   MEDICAL HISTORY:    Past Medical History:  Diagnosis Date  . Anemia    during pregnancy, not now.  . 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 Atypical syncope    1 episode -never had follow up with cardiology-never any further episodes, did go to urgent care.  . Colon cancer [facility])    rectal cancer Radiation and oral chemo with radiation.  . Complication of anesthesia   . Family history of breast cancer   . Family history of uterine cancer   . Genital lesion, female    at times  . Heart burn    occ.  Marland Kitchen Hx of seasonal allergies   . Motion sickness   . PONV (postoperative nausea and vomiting)   . Umbilicus discharge     SURGICAL HISTORY: Past Surgical History:  Procedure Laterality Date  . 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 CESAREAN SECTION    . DIAGNOSTIC LAPAROSCOPY     with D and C and Ovarian cystectomy, tubal ligation done at this time '00  . DILATION AND CURETTAGE OF UTERUS    . TUBAL LIGATION    . XI ROBOTIC ASSISTED LOWER ANTERIOR RESECTION N/A 03/21/2016   Procedure: XI ROBOTIC ASSISTED LOWER ANTERIOR RESECTION;  Surgeon: Leighton Ruff, MD;  Location: [facility];  Service: General;  Laterality: N/A;    SOCIAL HISTORY: Social History   Social History  . Marital status: Legally Separated    Spouse name: N/A  . Number of children: 3  . Years of education: N/A   Occupational History  . Not on file.   Social History Main Topics  . Smoking status: Never Smoker  . Smokeless tobacco: Never Used  . Alcohol use No  . Drug use: No  . Sexual activity: Not on file   Other Topics Concern  . Not on file   Social History Narrative  . No narrative on file   She has 3 children, she lives with her twin sons who are 63. She works for [employer]   FAMILY HISTORY: Family History  Problem Relation Age of Onset  . Breast cancer Maternal Grandmother     dx in her 63s  . Cancer Paternal Grandmother 20    uterine cancer    . Hypertension Father   . COPD Father   . Hypertension Sister   . Hypertension Paternal Grandfather   . Heart attack Paternal Grandfather   . Hypertension Sister   . Skin cancer Cousin     maternal first cousin    ALLERGIES:  is allergic to codeine; morphine and related; sucralose; and zolpidem tartrate.  MEDICATIONS:  Current Outpatient Prescriptions  Medication Sig Dispense Refill  . aluminum hydroxide-magnesium carbonate (GAVISCON) 95-358 MG/15ML SUSP Take 15 mLs by mouth as needed.    Marland Kitchen estradiol (ESTRACE) 2 MG tablet TK 1 T PO QD  4  . Ginger, Zingiber officinalis, (GINGER ROOT PO) Take 1 capsule by mouth as needed.     Marland Kitchen  ondansetron (ZOFRAN) 8 MG tablet Take 1 tablet (8 mg total) by mouth every 8 (eight) hours as needed for nausea. (Patient not taking: Reported on 06/13/2016) 30 tablet 3  . prochlorperazine (COMPAZINE) 10 MG tablet Take 1 tablet (10 mg total) by mouth every 6 (six) hours as needed (Nausea or vomiting). (Patient not taking: Reported on 06/13/2016) 30 tablet 1  . valACYclovir (VALTREX) 500 MG tablet take 1 tablet by mouth as needed when has outbreak   for BID x 3 days.  4   No current facility-administered medications for this visit.     REVIEW OF SYSTEMS:   Constitutional: Denies fevers, chills or abnormal night sweats Eyes: Denies blurriness of vision, double vision or watery eyes Ears, nose, mouth, throat, and face: Denies mucositis or sore throat Respiratory: Denies cough, dyspnea or wheezes Cardiovascular: Denies palpitation, chest discomfort or lower extremity swelling Gastrointestinal:  Denies nausea, heartburn or change in bowel habits Skin: Denies abnormal skin rashes Lymphatics: Denies new lymphadenopathy or easy bruising Neurological:Denies numbness, tingling or new weaknesses Behavioral/Psych: Mood is stable, no new changes  All other systems were reviewed with the patient and are negative.  PHYSICAL EXAMINATION: ECOG PERFORMANCE STATUS: 1 -  Symptomatic but completely ambulatory  Vitals:   06/13/16 1352  BP: 104/73  Pulse: 67  Resp: 16  Temp: 97.9 F (36.6 C)   Filed Weights   06/13/16 1352  Weight: 167 lb 11.2 oz (76.1 kg)    GENERAL:alert, no distress and comfortable SKIN: skin color, texture, turgor are normal, no rashes or significant lesions EYES: normal, conjunctiva are pink and non-injected, sclera clear OROPHARYNX:no exudate, no erythema and lips, buccal mucosa, and tongue normal  NECK: supple, thyroid normal size, non-tender, without nodularity LYMPH:  no palpable lymphadenopathy in the cervical, axillary or inguinal LUNGS: clear to auscultation and percussion with normal breathing effort HEART: regular rate & rhythm and no murmurs and no lower extremity edema ABDOMEN:abdomen soft, non-tender and normal bowel sounds.  Musculoskeletal:no cyanosis of digits and no clubbing  PSYCH: alert & oriented x 3 with fluent speech NEURO: no focal motor/sensory deficits  LABORATORY DATA:  I have reviewed the data as listed CBC Latest Ref Rng & Units 06/13/2016 05/26/2016 05/11/2016  WBC 3.9 - 10.3 10e3/uL 2.3(L) 1.9(L) 2.3(L)  Hemoglobin 11.6 - 15.9 g/dL 12.4 11.7 12.4  Hematocrit 34.8 - 46.6 % 37.9 35.6 37.1  Platelets 145 - 400 10e3/uL 226 190 152    CMP Latest Ref Rng & Units 06/13/2016 05/26/2016 05/11/2016  Glucose 70 - 140 mg/dl 76 76 80  BUN 7.0 - 26.0 mg/dL 9.5 7.7 11.5  Creatinine 0.6 - 1.1 mg/dL 0.8 0.7 0.8  Sodium 136 - 145 mEq/L 143 143 140  Potassium 3.5 - 5.1 mEq/L 4.0 3.9 3.8  Chloride 101 - 111 mmol/L - - -  CO2 22 - 29 mEq/L _0 Calcium 8.4 - 10.4 mg/dL 9.7 9.2 9.6  Total Protein 6.4 - 8.3 g/dL 7.5 6.8 7.8  Total Bilirubin 0.20 - 1.20 mg/dL 0.80 0.59 1.71(H)  Alkaline Phos 40 - 150 U/L 66 58 69  AST 5 - 34 U/L 23 32 33  ALT 0 - 55 U/L _1 CEA 12/18/2015: 0.9 04/27/2016: 0.6  PATHOLOGY REPORT Diagnosis 12/03/2015 Colon, sigmoid, mass, biopsy -Invasive adenocarcinoma, moderately  differentiated. See comment.  Comment: due to the presence of adenocarcinoma, IHC for DNA mismatch repair proteins will be performed and reported in an addendum.  Diagnosis 03/21/2016 1. Colon, segmental  resection for tumor, rectosigmoid - INVASIVE WELL DIFFERENTIATED ADENOCARCINOMA, SPANNING 5 CM IN GREATEST DIMENSION. - TUMOR INVADES THROUGH MUSCULARIS PROPRIA TO INVOLVE SUBSEROSAL SOFT TISSUES. - MARGINS ARE NEGATIVE. - ONE OF TWENTY-SEVEN LYMPH NODES IS POSITIVE FOR METASTATIC ADENOCARCINOMA (1/27). - SEE ONCOLOGY TEMPLATE. 2. Colon, resection margin (donut), distal rectal, final margin - BENIGN COLORECTAL MUCOSA. - NO TUMOR SEEN. Microscopic Comment 1. COLON AND RECTUM (INCLUDING TRANS-ANAL RESECTION): Specimen: Rectosigmoid colon. Procedure: Robotic lower anterior resection. Tumor site: Rectum. Specimen integrity: Intact. Macroscopic intactness of mesorectum: Complete. Macroscopic tumor perforation: Not identified. Invasive tumor: Maximum size: 5 cm. Histologic type(s): Adenocarcinoma. Histologic grade and differentiation: G1: well differentiated/low grade. Type of polyp in which invasive carcinoma arose: Tubular adenoma with high grade dysplasia. Microscopic extension of invasive tumor: Tumor invades through muscularis propria to involve subserosal soft tissues. Lymph-Vascular invasion: Although definitive lymph/vascular invasion is not identified, there is lymph node positivity (see below). Peri-neural invasion: None identified. Tumor deposit(s) (discontinuous extramural extension): None identified. Resection margins: Proximal margin: Negative. Distal margin: Negative. Circumferential (radial) (posterior ascending, posterior descending; lateral and posterior mid-rectum; and entire lower 1/3 rectum): Negative. Mesenteric margin: Not applicable. Distance closest margin (if all above margins negative): .2.4 cm (distal margin) Trans-anal resection margins only: Not  applicable. Deep margin: Not applicable. Mucosal Margin: Not applicable. Distance closest mucosal margin (if negative): Not applicable. Treatment effect (neo-adjuvant therapy): Minimal treatment effect is present in the primary tumor. Minimal treatment effect is also present in the positive lymph nodes with the other lymph nodes showing no degree of treatment effect. Additional polyp(s): No additional polyps identified. Non-neoplastic findings: Diverticulosis. Lymph nodes: number examined 27; number positive: 1. Pathologic Staging: ypT3, ypN1a. Ancillary studies: As the patient is status-post neoadjuvant therapy, MSI by PCR and MMR by [facility] will not be sent.   RADIOGRAPHIC STUDIES: I have personally reviewed the radiological images as listed and agreed with the findings in the report. No results found. COLONOSCOPY DR. MEDOFF 12/03/2015  Impression Malignant partially obstructing tumor in the proximal rectum, its diameter measured 6 mm, biopsy. Distal margin at 15 cm.    ASSESSMENT & PLAN:  51 year old Caucasian female, without significant past medical history, presented with abdominal pain and rectal bleeding. Colonoscopy showed a obstructing tumor in the proximal rectum.  1. Rectal adenocarcinoma, proximal rectum, cT3N1M0, ypT3N1aM0, stage IIIB, moderately differentiated --I have reviewed her surgical path findings with pt in details -she has significant residual disease after neoadjuvant chemo and radiaiton -I discussed the high risk of cancer recurrence after surgery, I recommend adjuvant chemo -we discussed the option of FOLFOX and CAPOX. She opted CAPOX  -She has some neurological symptoms after oxaliplatin infusion, and refuses to try again with premedication. She states she is not going to regret even if she had cancer recurrence -I encouraged her to consider single agent Xeloda, which she took with radiation before. She declined. --I discussed the risk of cancer recurrence in the  future. I discussed the surveillance plan, which is a physical exam and lab test (including CBC, CMP and CEA) every 3 months for the first 2 years, then every 6-12 months, colonoscopy in one year, and surveilliance CT scan every 6-12 month for up to 5 year. She is agreeable with the plan. -Lab results reviewed with her, CBC and CMP are normal except mild neutropenia. -I encouraged her to eat healthy, and gradually increase her physical activity levels, and exercise, to recover better.  2. Fatigue  -Possibly related to her surgery and chemotherapy, she is recovering  slowly -I strongly encouraged her to gradually increase her activity level, and exercise regularly, to get her strength back  3. Genetics -she was seen by Dietitian, and her genetic test was negative for lynch syndrome or the following 32 genes: APC, ATM, AXIN2, BARD1, BMPR1A, BRCA1, BRCA2, BRIP1, CDH1, CDK4, CDKN2A, CHEK2, EPCAM, FANCC, MLH1, MSH2, MSH6, MUTYH, NBN, PALB2, PMS2, POLD1, POLE, PTEN, RAD51C, RAD51D, SCG5/GREM1, SMAD4, STK11, TP53, VHL, and XRCC2.    Plan -Return to clinic in 3 months with lab for follow-up  All questions were answered. The patient knows to call the clinic with any problems, questions or concerns.  I spent 20 minutes counseling the patient face to face. The total time spent in the appointment was 25 minutes and more than 50% was on counseling.     Truitt Merle, MD 06/13/2016

## 2016-06-16 ENCOUNTER — Ambulatory Visit: Payer: 59

## 2016-06-30 ENCOUNTER — Telehealth: Payer: Self-pay | Admitting: *Deleted

## 2016-06-30 ENCOUNTER — Other Ambulatory Visit: Payer: Self-pay | Admitting: Hematology

## 2016-06-30 DIAGNOSIS — C20 Malignant neoplasm of rectum: Secondary | ICD-10-CM

## 2016-06-30 NOTE — Telephone Encounter (Signed)
Received vm call from pt stating that she is having some leg weakness & it has not improved & she wants to have some PT.  Discussed with Dr Burr Medico & Referral placed & called [facility] & left message to return call regarding setting this up. She has seen Earlie Counts before.

## 2016-07-06 ENCOUNTER — Ambulatory Visit: Payer: 59

## 2016-07-11 ENCOUNTER — Encounter: Payer: Self-pay | Admitting: Physical Therapy

## 2016-07-11 ENCOUNTER — Ambulatory Visit: Payer: 59 | Attending: Hematology | Admitting: Physical Therapy

## 2016-07-11 DIAGNOSIS — M62838 Other muscle spasm: Secondary | ICD-10-CM | POA: Diagnosis present

## 2016-07-11 DIAGNOSIS — M6281 Muscle weakness (generalized): Secondary | ICD-10-CM | POA: Insufficient documentation

## 2016-07-11 DIAGNOSIS — M25651 Stiffness of right hip, not elsewhere classified: Secondary | ICD-10-CM | POA: Insufficient documentation

## 2016-07-11 DIAGNOSIS — M25652 Stiffness of left hip, not elsewhere classified: Secondary | ICD-10-CM

## 2016-07-11 NOTE — Patient Instructions (Signed)
Scar Massage  Scar massage is done to improve the mobility of scar, decrease scar tissue from building up, reduce adhesions, and prevent Keloids from forming. Start scar massage after scabs have fallen off by themselves and no open areas. The first few weeks after surgery, it is normal for a scar to appear pink or red and slightly raised. Scars can itch or have areas of numbness. Some scars may be sensitive.   Direct Scar massage: after scar is healed, no opening, no scab 1.  Place pads of two fingers together directly on the scar starting at one end of the scar. Move the fingers up and down across the scar holding 5 seconds one direction.  Then go opposite direction hold 5 seconds.  2. Move over to the next section of the scar and repeat.  Work your way along the entire length of the scar.   3. Next make diagonal movements along the scar holding 5 seconds at one direction. 4. Next movement is side to side. 5. Do not rub fingers over the scar.  Instead keep firm pressure and move scar over the tissue it is on top   Scar Lift and Roll 12 weeks after surgery. 1. Pinch a small amount of the scar between your first two fingers and thumb.  2. Roll the scar between your fingers for 5 to 15 seconds. 3. Move along the scar and repeat until you have massaged the entire length of scar.   Stop the massage and call your doctor if you notice: 1. Increased redness 2. Bleeding from scar 3. Seepage coming from the scar 4. Scar is warmer and has increased pain   [facility] [address], [address] [place], [place] [address] Phone # [phone] Fax [phone]  

## 2016-07-11 NOTE — Therapy (Signed)
[facility] [address] [place], [place], [address] Phone: [phone]   Fax:  [phone]  Physical Therapy Evaluation  Patient Details  Name: Kaitlyn Morgan MRN: [id number] Date of Birth: [birth year] Referring Provider: Dr. Truitt Merle  Encounter Date: 07/11/2016      PT End of Session - 07/11/16 1018    Visit Number 1   Date for PT Re-Evaluation 09/05/16   PT Start Time 0930   PT Stop Time 1010   PT Time Calculation (min) 40 min   Activity Tolerance Patient tolerated treatment well   Behavior During Therapy [facility] for tasks assessed/performed      Past Medical History:  Diagnosis Date  . Anemia    during pregnancy, not now.  . 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 Atypical syncope    1 episode -never had follow up with cardiology-never any further episodes, did go to urgent care.  . Colon cancer [facility])    rectal cancer Radiation and oral chemo with radiation.  . Complication of anesthesia   . Family history of breast cancer   . Family history of uterine cancer   . Genital lesion, female    at times  . Heart burn    occ.  Marland Kitchen Hx of seasonal allergies   . Motion sickness   . PONV (postoperative nausea and vomiting)   . Umbilicus discharge     Past Surgical History:  Procedure Laterality Date  . 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 CESAREAN SECTION    . DIAGNOSTIC LAPAROSCOPY     with D and C and Ovarian cystectomy, tubal ligation done at this time '00  . DILATION AND CURETTAGE OF UTERUS    . TUBAL LIGATION    . XI ROBOTIC ASSISTED LOWER ANTERIOR RESECTION N/A 03/21/2016   Procedure: XI ROBOTIC ASSISTED LOWER ANTERIOR RESECTION;  Surgeon: Leighton Ruff, MD;  Location: [facility];  Service: General;  Laterality: N/A;    There were no vitals filed for this visit.       Subjective Assessment - 07/11/16 0945    Subjective Patient reports  she has difficulty with walking. Patient is doing her stretches.  Patient has difficulty with getting out of a chair after sitting for awhile. Patient reports she is not feeling normal again.    Patient Stated Goals increase strength and learn how to exercise   Currently in Pain? Yes   Pain Score 8   low is 2/10   Pain Location Hip   Pain Orientation Anterior;Posterior   Pain Descriptors / Indicators Tightness   Pain Type Chronic pain   Pain Onset More than a month ago   Pain Frequency Intermittent   Aggravating Factors  getting out of a chair; walking   Pain Relieving Factors not sitting too long   Multiple Pain Sites No            [facility] PT Assessment - 07/11/16 0001      Assessment   Medical Diagnosis C20Rectal cancer   Referring Provider Dr. Truitt Merle   Onset Date/Surgical Date 12/28/15   Prior Therapy yes     Precautions   Precautions Other (comment)   Precaution Comments Cancer precautions     Restrictions   Weight Bearing Restrictions No     Balance Screen   Has the patient fallen in the past 6 months No   Has the patient had a decrease  in activity level because of a fear of falling?  No   Is the patient reluctant to leave their home because of a fear of falling?  No     Home Ecologist residence     Prior Function   Level of Independence Independent   Vocation Full time employment   Vocation Requirements clerical work     Cognition   Overall Cognitive Status Within Functional Limits for tasks assessed     Observation/Other Assessments   Observations abdominal scar is tender   Focus on Therapeutic Outcomes (FOTO)  38% limitation     Posture/Postural Control   Posture/Postural Control No significant limitations     ROM / Strength   AROM / PROM / Strength Strength;AROM     AROM   Overall AROM Comments lumbar extension decreased by 25% with tightness     Strength   Overall Strength Comments abdominal strengthis 1/5    Strength Assessment Site Hip   Right Hip Extension 4/5   Right Hip External Rotation  4/5   Right Hip Internal Rotation 4/5   Right Hip ABduction 3+/5   Left Hip Flexion 4/5   Left Hip Extension 4+/5   Left Hip External Rotation 4/5   Left Hip Internal Rotation 4/5   Left Hip ABduction 3+/5     Standardized Balance Assessment   Standardized Balance Assessment Five Times Sit to Stand   Five times sit to stand comments  15 sec. no hands                           PT Education - 07/11/16 1001    Education provided Yes   Education Details scar massage   Person(s) Educated Patient   Methods Explanation;Demonstration;Verbal cues;Handout   Comprehension Returned demonstration;Verbalized understanding          PT Short Term Goals - 07/11/16 1015      PT SHORT TERM GOAL #1   Title independent with initial HEP   Time 4   Period Weeks   Status New     PT SHORT TERM GOAL #2   Title ability to sit for 1 hour and moderate stiffness and tightness   Time 4   Period Weeks   Status New     PT SHORT TERM GOAL #3   Title ability to get in and out of bed with moderate difficulty   Time 4   Period Weeks   Status New     PT SHORT TERM GOAL #4   Title ------           PT Long Term Goals - 07/11/16 1002      PT LONG TERM GOAL #1   Title independent with HEP   Time 8   Period Weeks   Status New     PT LONG TERM GOAL #2   Title be able to get up and down from a chair without using hands in 11 sec. due to increased lower extremity strength   Time 8   Period Weeks   Status New     PT LONG TERM GOAL #3   Title walking 30 min. 2-3x/week due to increased endurance   Time 8   Period Weeks   Status New     PT LONG TERM GOAL #4   Title be able to get out of the bed in the morning with minimal to no diffculty due to increased strength  and mobility   Time 8   Period Weeks   Status New     PT LONG TERM GOAL #5   Title after sitting for 1 hour able to  stand up with minimal to no difficulty     Time 8   Period Weeks   Status New               Plan - 07/11/16 1008    Clinical Impression Statement Patient is a 50 year old female with diagnosis of rectal cancer.  Patient had radiation and chemotherapy with Xeloda from 12/30/2015 to 02/08/2016.  Patient s/p rectosigmoid segmental resection on 03/21/2016.  Patient reports pain is 8/10 due to tighteness in hips.  Bilateral hip strength is 3-4/5.  Patient sit to stand in 15 sec 5 times. Patient reports she needs assistance to get out of chair due to weakness and tightness.  Patient has difficulty to get out of bed due to weakness and tigtness.  Patient is of  moderate complexity due to rectal cancer, having radiation, and surgery that will impact care provided and pain is evolving. Patient will benefit from skilled therapy to improve strength and endurance.    Rehab Potential Excellent   Clinical Impairments Affecting Rehab Potential s/p radiation  and chemotherapy with Xeloda 12/30/2015-02/08/2016; 03/21/2016 rectosimoid segmental resection   PT Frequency 2x / week   PT Duration 8 weeks   PT Treatment/Interventions Therapeutic activities;Therapeutic exercise;Balance training;Neuromuscular re-education;Patient/family education;Scar mobilization;Manual techniques;Energy conservation   PT Next Visit Plan nusteps; hip strength; leg press; squats; stretches   PT Home Exercise Plan progress as needed   Recommended Other Services None   Consulted and Agree with Plan of Care Patient      Patient will benefit from skilled therapeutic intervention in order to improve the following deficits and impairments:  Pain, Decreased strength, Decreased activity tolerance, Decreased endurance, Increased muscle spasms  Visit Diagnosis: Muscle weakness (generalized) - Plan: PT plan of care cert/re-cert  Stiffness of right hip, not elsewhere classified - Plan: PT plan of care cert/re-cert  Stiffness of left hip, not  elsewhere classified - Plan: PT plan of care cert/re-cert     Problem List Patient Active Problem List   Diagnosis Date Noted  . Diarrhea 05/11/2016  . Dehydration 05/11/2016  . Nausea with vomiting 05/11/2016  . Anxiety 05/11/2016  . Genetic testing 02/19/2016  . Family history of breast cancer   . Family history of uterine cancer   . Rectal cancer ([place]) 12/18/2015    Earlie Counts, PT 07/11/16 10:20 AM   [facility] [address] [place], [place], [address] Phone: [phone]   Fax:  [phone]  Name: Kaitlyn Morgan MRN: [id number] Date of Birth: [birth year]

## 2016-07-14 ENCOUNTER — Encounter: Payer: Self-pay | Admitting: Physical Therapy

## 2016-07-14 ENCOUNTER — Telehealth: Payer: Self-pay | Admitting: *Deleted

## 2016-07-14 ENCOUNTER — Ambulatory Visit: Payer: 59 | Admitting: Physical Therapy

## 2016-07-14 DIAGNOSIS — M25652 Stiffness of left hip, not elsewhere classified: Secondary | ICD-10-CM

## 2016-07-14 DIAGNOSIS — M62838 Other muscle spasm: Secondary | ICD-10-CM

## 2016-07-14 DIAGNOSIS — M6281 Muscle weakness (generalized): Secondary | ICD-10-CM

## 2016-07-14 DIAGNOSIS — M25651 Stiffness of right hip, not elsewhere classified: Secondary | ICD-10-CM

## 2016-07-14 NOTE — Patient Instructions (Addendum)
Bracing With Arms / Legs (Hook-Lying)    With neutral spine, tighten pelvic floor and abdominals and hold. Raise arm and opposite leg, then return. Repeat wtih other limbs. Repeat _20__ times. Do __1_ times a day.   Copyright  VHI. All rights reserved.  Bracing With Bridging (Hook-Lying)    With neutral spine, tighten pelvic floor and abdominals and hold. Lift bottom. Repeat __15_ times.Hold 5 sec.  Do _1__ times a day.   Copyright  VHI. All rights reserved.  Sash    On back, knees bent, feet flat, left hand on left hip, right hand above left. Pull right arm DIAGONALLY (hip to shoulder) across chest. Bring right arm along head toward floor. Hold momentarily. Slowly return to starting position. Repeat _10__ times. Do with left arm. Band color __yellow____  1 time per day.  Copyright  VHI. All rights reserved.  Over Head Pull: Wide Grip    On back, knees bent, feet flat, band across thighs, elbows straight but relaxed. Pull hands apart (start). Keeping elbows straight, bring arms up and over head, hands toward floor. Keep steady pull on band. Hold momentarily. Return slowly, keeping pull steady, back to start. Repeat 10___ times. Band color ___yellow___   1 time per day Copyright  VHI. All rights reserved.  Side Pull: Double Arm    On back, knees bent, feet flat. Arms perpendicular to body, shoulder level, elbows straight but relaxed. Pull arms out to sides, elbows straight. Resistance band comes across collarbones, hands toward floor. Hold momentarily. Slowly return to starting position. Repeat 10___ times. Band color yellow_____  1 time per day.   Copyright  VHI. All rights reserved.  Abduction: Clam (Eccentric) - Side-Lying    Lie on side with knees bent. Lift top knee, keeping feet together. Keep trunk steady. Slowly lower for 3-5 seconds. _10__ reps per set, _1__ sets per day, 1___ days per week.  http://ecce.exer.us/65   Copyright  VHI. All rights reserved.    [facility] [address] [place], [place] [address] Phone # [phone] Fax [phone]

## 2016-07-14 NOTE — Therapy (Signed)
[facility] [address], [place], [place], [address] Phone: [phone]   Fax:  [phone]  Physical Therapy Treatment  Patient Details  Name: Kaitlyn Morgan MRN: [id number] Date of Birth: [birth year] Referring Provider: Dr. Truitt Merle  Encounter Date: 07/14/2016      PT End of Session - 07/14/16 0853    Visit Number 2   Date for PT Re-Evaluation 09/05/16   PT Start Time 0846   PT Stop Time 0925   PT Time Calculation (min) 39 min   Activity Tolerance Patient tolerated treatment well   Behavior During Therapy [facility] for tasks assessed/performed      Past Medical History:  Diagnosis Date  . Anemia    during pregnancy, not now.  . 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 Atypical syncope    1 episode -never had follow up with cardiology-never any further episodes, did go to urgent care.  . Colon cancer [facility])    rectal cancer Radiation and oral chemo with radiation.  . Complication of anesthesia   . Family history of breast cancer   . Family history of uterine cancer   . Genital lesion, female    at times  . Heart burn    occ.  Marland Kitchen Hx of seasonal allergies   . Motion sickness   . PONV (postoperative nausea and vomiting)   . Umbilicus discharge     Past Surgical History:  Procedure Laterality Date  . 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 CESAREAN SECTION    . DIAGNOSTIC LAPAROSCOPY     with D and C and Ovarian cystectomy, tubal ligation done at this time '00  . DILATION AND CURETTAGE OF UTERUS    . TUBAL LIGATION    . XI ROBOTIC ASSISTED LOWER ANTERIOR RESECTION N/A 03/21/2016   Procedure: XI ROBOTIC ASSISTED LOWER ANTERIOR RESECTION;  Surgeon: Leighton Ruff, MD;  Location: [facility];  Service: General;  Laterality: N/A;    There were no vitals filed for this visit.      Subjective Assessment - 07/14/16 0853    Subjective I have stiffness in  low back.    Patient Stated Goals increase strength and learn how to exercise   Currently in Pain? Yes   Pain Score 5    Pain Location Back   Pain Orientation Lower   Pain Descriptors / Indicators Tightness   Pain Type Chronic pain   Pain Onset More than a month ago   Pain Frequency Intermittent   Aggravating Factors  getting out of a chair; walking   Pain Relieving Factors not sitting too long   Multiple Pain Sites No                         [facility] Adult PT Treatment/Exercise - 07/14/16 0001      Lumbar Exercises: Stretches   Active Hamstring Stretch 2 reps;30 seconds  sitting   Single Knee to Chest Stretch 2 reps;30 seconds  bil.    Double Knee to Chest Stretch 1 rep;30 seconds   Lower Trunk Rotation 2 reps;30 seconds  bringing leg over hip   Press Ups 5 reps   Piriformis Stretch 2 reps;30 seconds  sitting     Lumbar Exercises: Standing   Other Standing Lumbar Exercises sit to stand 15 times     Lumbar Exercises: Supine   Dead Bug 20 reps  with  abdominal bracing   Bridge 10 reps;5 seconds  lift one vertebrae at a time     Knee/Hip Exercises: Aerobic   Nustep level 1 10 min arm #11, seat # 6     Shoulder Exercises: Supine   Horizontal ABduction Strengthening;Both;15 reps;Theraband   Theraband Level (Shoulder Horizontal ABduction) Level 1 (Yellow)                PT Education - 07/14/16 0925    Education provided Yes   Education Details core strengthening, bilateral shoulder exercises, and hip exercises   Person(s) Educated Patient   Methods Explanation;Demonstration;Verbal cues;Handout   Comprehension Returned demonstration;Verbalized understanding          PT Short Term Goals - 07/11/16 1015      PT SHORT TERM GOAL #1   Title independent with initial HEP   Time 4   Period Weeks   Status New     PT SHORT TERM GOAL #2   Title ability to sit for 1 hour and moderate stiffness and tightness   Time 4   Period Weeks   Status New      PT SHORT TERM GOAL #3   Title ability to get in and out of bed with moderate difficulty   Time 4   Period Weeks   Status New     PT SHORT TERM GOAL #4   Title ------           PT Long Term Goals - 07/11/16 1002      PT LONG TERM GOAL #1   Title independent with HEP   Time 8   Period Weeks   Status New     PT LONG TERM GOAL #2   Title be able to get up and down from a chair without using hands in 11 sec. due to increased lower extremity strength   Time 8   Period Weeks   Status New     PT LONG TERM GOAL #3   Title walking 30 min. 2-3x/week due to increased endurance   Time 8   Period Weeks   Status New     PT LONG TERM GOAL #4   Title be able to get out of the bed in the morning with minimal to no diffculty due to increased strength and mobility   Time 8   Period Weeks   Status New     PT LONG TERM GOAL #5   Title after sitting for 1 hour able to stand up with minimal to no difficulty     Time 8   Period Weeks   Status New               Plan - 07/14/16 0926    Clinical Impression Statement Patient pain decreased to 3/5 at end of treatment.  Patient need encouragement and is very anxious of her pains and stiffness.  Patient was able to tolerate 38 minutes of exercise without a rest.  Patient will benefit form skilled therapy to improve strength and endurance.    Rehab Potential Excellent   Clinical Impairments Affecting Rehab Potential s/p radiation  and chemotherapy with Xeloda 12/30/2015-02/08/2016; 03/21/2016 rectosimoid segmental resection   PT Frequency 2x / week   PT Duration 8 weeks   PT Treatment/Interventions Therapeutic activities;Therapeutic exercise;Balance training;Neuromuscular re-education;Patient/family education;Scar mobilization;Manual techniques;Energy conservation   PT Next Visit Plan nusteps; hip strength; leg press; squats; stretches   PT Home Exercise Plan progress as needed   Consulted and Agree with Plan  of Care Patient       Patient will benefit from skilled therapeutic intervention in order to improve the following deficits and impairments:  Pain, Decreased strength, Decreased activity tolerance, Decreased endurance, Increased muscle spasms  Visit Diagnosis: Muscle weakness (generalized)  Stiffness of right hip, not elsewhere classified  Stiffness of left hip, not elsewhere classified  Other muscle spasm     Problem List Patient Active Problem List   Diagnosis Date Noted  . Diarrhea 05/11/2016  . Dehydration 05/11/2016  . Nausea with vomiting 05/11/2016  . Anxiety 05/11/2016  . Genetic testing 02/19/2016  . Family history of breast cancer   . Family history of uterine cancer   . Rectal cancer ([place]) 12/18/2015    Earlie Counts, PT 07/14/16 9:29 AM   [facility] [address] [place], [place], [address] Phone: [phone]   Fax:  [phone]  Name: Emmaleah Moncion MRN: [id number] Date of Birth: 1966-05-13

## 2016-07-14 NOTE — Telephone Encounter (Signed)
Received call from pt stating she broke a tooth & needs to see a dentist & wants to know if there are any contraindications to this.  Returned call & informed that blood counts should be back to normal at this point but to make sure dentist knows diagnosis & meds.  She is not on any blood thinners.  She will discuss with dentist.

## 2016-07-18 ENCOUNTER — Ambulatory Visit: Payer: 59 | Attending: Hematology | Admitting: Physical Therapy

## 2016-07-18 ENCOUNTER — Encounter: Payer: Self-pay | Admitting: Physical Therapy

## 2016-07-18 DIAGNOSIS — M6281 Muscle weakness (generalized): Secondary | ICD-10-CM | POA: Diagnosis not present

## 2016-07-18 DIAGNOSIS — M25651 Stiffness of right hip, not elsewhere classified: Secondary | ICD-10-CM

## 2016-07-18 DIAGNOSIS — M25652 Stiffness of left hip, not elsewhere classified: Secondary | ICD-10-CM | POA: Insufficient documentation

## 2016-07-18 DIAGNOSIS — M62838 Other muscle spasm: Secondary | ICD-10-CM | POA: Diagnosis present

## 2016-07-18 NOTE — Therapy (Signed)
[facility] [address] [place], [place], [address] Phone: [phone]   Fax:  [phone]  Physical Therapy Treatment  Patient Details  Name: Kaitlyn Morgan MRN: [id number] Date of Birth: 1966/06/19 Referring Provider: Dr. Truitt Merle  Encounter Date: 07/18/2016      PT End of Session - 07/18/16 1643    Visit Number 3   Date for PT Re-Evaluation 09/05/16   PT Start Time 1600   PT Stop Time 1645   PT Time Calculation (min) 45 min   Activity Tolerance Patient tolerated treatment well   Behavior During Therapy [facility] for tasks assessed/performed      Past Medical History:  Diagnosis Date  . Anemia    during pregnancy, not now.  . 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 Atypical syncope    1 episode -never had follow up with cardiology-never any further episodes, did go to urgent care.  . Colon cancer [facility])    rectal cancer Radiation and oral chemo with radiation.  . Complication of anesthesia   . Family history of breast cancer   . Family history of uterine cancer   . Genital lesion, female    at times  . Heart burn    occ.  Marland Kitchen Hx of seasonal allergies   . Motion sickness   . PONV (postoperative nausea and vomiting)   . Umbilicus discharge     Past Surgical History:  Procedure Laterality Date  . 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 CESAREAN SECTION    . DIAGNOSTIC LAPAROSCOPY     with D and C and Ovarian cystectomy, tubal ligation done at this time '00  . DILATION AND CURETTAGE OF UTERUS    . TUBAL LIGATION    . XI ROBOTIC ASSISTED LOWER ANTERIOR RESECTION N/A 03/21/2016   Procedure: XI ROBOTIC ASSISTED LOWER ANTERIOR RESECTION;  Surgeon: Leighton Ruff, MD;  Location: [facility];  Service: General;  Laterality: N/A;    There were no vitals filed for this visit.      Subjective Assessment - 07/18/16 1608    Subjective I have had a pain in  my mid back and happened after I carried some things in a store.    Patient Stated Goals increase strength and learn how to exercise   Currently in Pain? Yes   Pain Score 8    Pain Location Thoracic   Pain Orientation Mid   Pain Descriptors / Indicators Tightness;Squeezing   Pain Type Acute pain   Pain Onset In the past 7 days   Pain Frequency Intermittent   Aggravating Factors  movement, standing, lifting   Pain Relieving Factors rest   Multiple Pain Sites No                         [facility] Adult PT Treatment/Exercise - 07/18/16 0001      Lumbar Exercises: Stretches   Lower Trunk Rotation 2 reps;30 seconds  sitting; right; left     Lumbar Exercises: Machines for Strengthening   Leg Press bil. legs seat #5 50# 3x10     Lumbar Exercises: Standing   Other Standing Lumbar Exercises sit to stand 15 times holding red plyoball     Lumbar Exercises: Supine   Other Supine Lumbar Exercises Lay on foam roll to decompress 2 min; bil. shoulder flexion 10x slowly; y movement to stretch pectoralis; alternate hip flexion 10x  each; alternate arm and leg 10x     Knee/Hip Exercises: Aerobic   Nustep level 1 10 min arm #11, seat # 6     Shoulder Exercises: Seated   Flexion Strengthening;Right;Left;15 reps;Weights   Flexion Weight (lbs) 2#   Flexion Limitations sitting on red physioball   Abduction Strengthening;Right;Left;15 reps   ABduction Weight (lbs) 2   Other Seated Exercises bicep curls with 2# 30x;      Manual Therapy   Manual Therapy Muscle Energy Technique   Muscle Energy Technique to T3-T6 for rotation and extension                  PT Short Term Goals - 07/18/16 1613      PT SHORT TERM GOAL #1   Title independent with initial HEP   Time 4   Period Weeks   Status On-going  hard to do exercises due to back pain     PT SHORT TERM GOAL #2   Title ability to sit for 1 hour and moderate stiffness and tightness   Time 4   Period Weeks   Status  On-going  has back pain     PT SHORT TERM GOAL #3   Title ability to get in and out of bed with moderate difficulty   Time 4   Period Weeks   Status On-going  back pain           PT Long Term Goals - 07/11/16 1002      PT LONG TERM GOAL #1   Title independent with HEP   Time 8   Period Weeks   Status New     PT LONG TERM GOAL #2   Title be able to get up and down from a chair without using hands in 11 sec. due to increased lower extremity strength   Time 8   Period Weeks   Status New     PT LONG TERM GOAL #3   Title walking 30 min. 2-3x/week due to increased endurance   Time 8   Period Weeks   Status New     PT LONG TERM GOAL #4   Title be able to get out of the bed in the morning with minimal to no diffculty due to increased strength and mobility   Time 8   Period Weeks   Status New     PT LONG TERM GOAL #5   Title after sitting for 1 hour able to stand up with minimal to no difficulty     Time 8   Period Weeks   Status New               Plan - 07/18/16 1651    Clinical Impression Statement Pateint has had trouble with exercises over the weekend due to mid back pain.  Patient felt better after therapy with muscle energy techniques. Patient is nervous with stiffness and pain. Patient has not met goals yet due to flare-up of the thoracic area. Patient will benefit from skilled therapy for pain and deconditioning.    Rehab Potential Excellent   Clinical Impairments Affecting Rehab Potential s/p radiation  and chemotherapy with Xeloda 12/30/2015-02/08/2016; 03/21/2016 rectosimoid segmental resection   PT Frequency 2x / week   PT Duration 8 weeks   PT Treatment/Interventions Therapeutic activities;Therapeutic exercise;Balance training;Neuromuscular re-education;Patient/family education;Scar mobilization;Manual techniques;Energy conservation   PT Next Visit Plan nusteps; hip strength; leg press; squats; stretches; upper body exercises   PT Home Exercise Plan  progress as  needed   Consulted and Agree with Plan of Care Patient      Patient will benefit from skilled therapeutic intervention in order to improve the following deficits and impairments:  Pain, Decreased strength, Decreased activity tolerance, Decreased endurance, Increased muscle spasms  Visit Diagnosis: Muscle weakness (generalized)  Stiffness of right hip, not elsewhere classified  Other muscle spasm  Stiffness of left hip, not elsewhere classified     Problem List Patient Active Problem List   Diagnosis Date Noted  . Diarrhea 05/11/2016  . Dehydration 05/11/2016  . Nausea with vomiting 05/11/2016  . Anxiety 05/11/2016  . Genetic testing 02/19/2016  . Family history of breast cancer   . Family history of uterine cancer   . Rectal cancer ([place]) 12/18/2015    Earlie Counts, PT 07/18/16 4:58 PM   [facility] [address], [place], [place], [address] Phone: [phone]   Fax:  [phone]  Name: Kaitlyn Morgan MRN: [id number] Date of Birth: 10-Nov-1965

## 2016-07-20 ENCOUNTER — Encounter: Payer: 59 | Admitting: Physical Therapy

## 2016-07-21 ENCOUNTER — Ambulatory Visit: Payer: 59 | Admitting: Physical Therapy

## 2016-07-21 ENCOUNTER — Encounter: Payer: Self-pay | Admitting: Physical Therapy

## 2016-07-21 DIAGNOSIS — M25651 Stiffness of right hip, not elsewhere classified: Secondary | ICD-10-CM

## 2016-07-21 DIAGNOSIS — M6281 Muscle weakness (generalized): Secondary | ICD-10-CM | POA: Diagnosis not present

## 2016-07-21 NOTE — Therapy (Signed)
[facility] [address] [place], [place], [address] Phone: [phone]   Fax:  [phone]  Physical Therapy Treatment  Patient Details  Name: Kaitlyn Morgan MRN: [id number] Date of Birth: 10-Sep-1965 Referring Provider: Dr. Truitt Merle  Encounter Date: 07/21/2016      PT End of Session - 07/21/16 1437    Visit Number 4   Date for PT Re-Evaluation 09/05/16   PT Start Time 1407   PT Stop Time 1445   PT Time Calculation (min) 38 min   Activity Tolerance Patient tolerated treatment well   Behavior During Therapy [facility] for tasks assessed/performed      Past Medical History:  Diagnosis Date  . Anemia    during pregnancy, not now.  . 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 Atypical syncope    1 episode -never had follow up with cardiology-never any further episodes, did go to urgent care.  . Colon cancer [facility])    rectal cancer Radiation and oral chemo with radiation.  . Complication of anesthesia   . Family history of breast cancer   . Family history of uterine cancer   . Genital lesion, female    at times  . Heart burn    occ.  Marland Kitchen Hx of seasonal allergies   . Motion sickness   . PONV (postoperative nausea and vomiting)   . Umbilicus discharge     Past Surgical History:  Procedure Laterality Date  . 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 CESAREAN SECTION    . DIAGNOSTIC LAPAROSCOPY     with D and C and Ovarian cystectomy, tubal ligation done at this time '00  . DILATION AND CURETTAGE OF UTERUS    . TUBAL LIGATION    . XI ROBOTIC ASSISTED LOWER ANTERIOR RESECTION N/A 03/21/2016   Procedure: XI ROBOTIC ASSISTED LOWER ANTERIOR RESECTION;  Surgeon: Leighton Ruff, MD;  Location: [facility];  Service: General;  Laterality: N/A;    There were no vitals filed for this visit.      Subjective Assessment - 07/21/16 1408    Subjective I am glad I was able  to get in today. I was feeling good after therapy.    Patient Stated Goals increase strength and learn how to exercise   Currently in Pain? No/denies                         [facility] Adult PT Treatment/Exercise - 07/21/16 0001      Lumbar Exercises: Machines for Strengthening   Leg Press bil. legs seat #5 60# 3x10     Lumbar Exercises: Supine   Other Supine Lumbar Exercises Lay on foam roll to decompress 2 min;; y movement to stretch pectoralis with red band; alternate hip flexion  and shoulder flexion 10x each; bil. shoulder horzontal abduction with red band; shoulder diagonals with red band 10x each way;      Knee/Hip Exercises: Aerobic   Elliptical level 1 4 min   Nustep level 2 8 min arm #11, seat # 6     Knee/Hip Exercises: Standing   Knee Flexion Strengthening;Right;Left;2 sets;15 reps     Manual Therapy   Manual Therapy Soft tissue mobilization   Soft tissue mobilization bilateral pectoralis muscle while on foam roll with arms outstretched                  PT Short  Term Goals - 07/21/16 1413      PT SHORT TERM GOAL #1   Title independent with initial HEP   Time 4   Period Weeks   Status Achieved     PT SHORT TERM GOAL #2   Title ability to sit for 1 hour and moderate stiffness and tightness   Time 4   Period Weeks   Status Achieved     PT SHORT TERM GOAL #3   Title ability to get in and out of bed with moderate difficulty   Time 4   Period Weeks   Status Achieved           PT Long Term Goals - 07/11/16 1002      PT LONG TERM GOAL #1   Title independent with HEP   Time 8   Period Weeks   Status New     PT LONG TERM GOAL #2   Title be able to get up and down from a chair without using hands in 11 sec. due to increased lower extremity strength   Time 8   Period Weeks   Status New     PT LONG TERM GOAL #3   Title walking 30 min. 2-3x/week due to increased endurance   Time 8   Period Weeks   Status New     PT LONG TERM  GOAL #4   Title be able to get out of the bed in the morning with minimal to no diffculty due to increased strength and mobility   Time 8   Period Weeks   Status New     PT LONG TERM GOAL #5   Title after sitting for 1 hour able to stand up with minimal to no difficulty     Time 8   Period Weeks   Status New               Plan - 07/21/16 1416    Clinical Impression Statement Patient has met her STG's. Patient reports it is 80% easier to get out of bed. Getting in and out of a chair is 80% easier. After walking patient is 65% less tired. Patient reports her upper back is 85% better from last week.  Patient able to increase resistance on nustep and leg press. Patient will benefit from skilled therapy to guide her in strengthening with correct posture.    Rehab Potential Excellent   Clinical Impairments Affecting Rehab Potential s/p radiation  and chemotherapy with Xeloda 12/30/2015-02/08/2016; 03/21/2016 rectosimoid segmental resection   PT Frequency 2x / week   PT Duration 8 weeks   PT Treatment/Interventions Therapeutic activities;Therapeutic exercise;Balance training;Neuromuscular re-education;Patient/family education;Scar mobilization;Manual techniques;Energy conservation   PT Next Visit Plan nusteps; hip strength; leg press; squats; ; upper body exercises; go over HEP   PT Home Exercise Plan progress as needed   Consulted and Agree with Plan of Care Patient      Patient will benefit from skilled therapeutic intervention in order to improve the following deficits and impairments:  Pain, Decreased strength, Decreased activity tolerance, Decreased endurance, Increased muscle spasms  Visit Diagnosis: Muscle weakness (generalized)  Stiffness of right hip, not elsewhere classified     Problem List Patient Active Problem List   Diagnosis Date Noted  . Diarrhea 05/11/2016  . Dehydration 05/11/2016  . Nausea with vomiting 05/11/2016  . Anxiety 05/11/2016  . Genetic testing  02/19/2016  . Family history of breast cancer   . Family history of uterine cancer   .  Rectal cancer ([place]) 12/18/2015    GRAY,CHERYL 07/21/2016, 2:42 PM  [facility] [address], [place], [place], [address] Phone: [phone]   Fax:  [phone]  Name: Kaitlyn Morgan MRN: [id number] Date of Birth: 1966-04-11

## 2016-07-25 ENCOUNTER — Encounter: Payer: Self-pay | Admitting: Physical Therapy

## 2016-07-25 ENCOUNTER — Ambulatory Visit: Payer: 59 | Admitting: Physical Therapy

## 2016-07-25 DIAGNOSIS — M6281 Muscle weakness (generalized): Secondary | ICD-10-CM

## 2016-07-25 DIAGNOSIS — M62838 Other muscle spasm: Secondary | ICD-10-CM

## 2016-07-25 DIAGNOSIS — M25651 Stiffness of right hip, not elsewhere classified: Secondary | ICD-10-CM

## 2016-07-25 NOTE — Therapy (Signed)
[facility] [address] [place], [place], [address] Phone: [phone]   Fax:  [phone]  Physical Therapy Treatment  Patient Details  Name: Kaitlyn Morgan MRN: [id number] Date of Birth: [birth year] Referring Provider: Dr. Truitt Merle  Encounter Date: 07/25/2016      PT End of Session - 07/25/16 1544    Visit Number 5   Date for PT Re-Evaluation 09/05/16   PT Start Time 1538   PT Stop Time 1616   PT Time Calculation (min) 38 min   Activity Tolerance Patient tolerated treatment well   Behavior During Therapy [facility] for tasks assessed/performed      Past Medical History:  Diagnosis Date  . Anemia    during pregnancy, not now.  . 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 Atypical syncope    1 episode -never had follow up with cardiology-never any further episodes, did go to urgent care.  . Colon cancer [facility])    rectal cancer Radiation and oral chemo with radiation.  . Complication of anesthesia   . Family history of breast cancer   . Family history of uterine cancer   . Genital lesion, female    at times  . Heart burn    occ.  Marland Kitchen Hx of seasonal allergies   . Motion sickness   . PONV (postoperative nausea and vomiting)   . Umbilicus discharge     Past Surgical History:  Procedure Laterality Date  . 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 CESAREAN SECTION    . DIAGNOSTIC LAPAROSCOPY     with D and C and Ovarian cystectomy, tubal ligation done at this time '00  . DILATION AND CURETTAGE OF UTERUS    . TUBAL LIGATION    . XI ROBOTIC ASSISTED LOWER ANTERIOR RESECTION N/A 03/21/2016   Procedure: XI ROBOTIC ASSISTED LOWER ANTERIOR RESECTION;  Surgeon: Leighton Ruff, MD;  [facility];  Service: General;  Laterality: N/A;    There were no vitals filed for this visit.      Subjective Assessment - 07/25/16 1544    Subjective I feel good after  therapy.     Patient Stated Goals increase strength and learn how to exercise   Currently in Pain? No/denies                         [facility] Adult PT Treatment/Exercise - 07/25/16 0001      Lumbar Exercises: Machines for Strengthening   Leg Press bil. legs seat #5 60# 2x15     Knee/Hip Exercises: Aerobic   Elliptical level 1 5 min     Knee/Hip Exercises: Standing   Knee Flexion Strengthening;Right;Left;2 sets;15 reps     Knee/Hip Exercises: Seated   Long Arc Quad Strengthening;Right;Left;2 sets;15 reps   Long Arc Quad Weight 5 lbs.                PT Education - 07/25/16 1609    Education provided Yes   Education Details theraband exercises for upper extremity strengthening while on foam roll; walking program, chair squats   Person(s) Educated Patient   Methods Explanation;Demonstration;Verbal cues;Handout   Comprehension Returned demonstration;Verbalized understanding          PT Short Term Goals - 07/21/16 1413      PT SHORT TERM GOAL #1   Title independent with initial HEP   Time 4   Period  Weeks   Status Achieved     PT SHORT TERM GOAL #2   Title ability to sit for 1 hour and moderate stiffness and tightness   Time 4   Period Weeks   Status Achieved     PT SHORT TERM GOAL #3   Title ability to get in and out of bed with moderate difficulty   Time 4   Period Weeks   Status Achieved           PT Long Term Goals - 07/25/16 1612      PT LONG TERM GOAL #1   Title independent with HEP   Time 8   Period Weeks   Status New     PT LONG TERM GOAL #2   Title be able to get up and down from a chair without using hands in 11 sec. due to increased lower extremity strength   Time 8   Period Weeks   Status On-going     PT LONG TERM GOAL #3   Title walking 30 min. 2-3x/week due to increased endurance   Time 8   Period Weeks   Status On-going     PT LONG TERM GOAL #4   Title be able to get out of the bed in the morning with  minimal to no diffculty due to increased strength and mobility   Time 8   Period Weeks   Status On-going     PT LONG TERM GOAL #5   Title after sitting for 1 hour able to stand up with minimal to no difficulty     Time 8   Period Weeks   Status On-going               Plan - 07/25/16 1610    Clinical Impression Statement Patient is able to go on the elliptical for 1 more minute.  Patient able to tolerate 38 min. of exercise without fatique.  Patient has correct posture with exercise. Patient has increased endurance for daily exercises. Patient will benefit from skilled therapy to guide her in strengthening with correct posture.    Rehab Potential Excellent   Clinical Impairments Affecting Rehab Potential s/p radiation  and chemotherapy with Xeloda 12/30/2015-02/08/2016; 03/21/2016 rectosimoid segmental resection   PT Frequency 2x / week   PT Duration 8 weeks   PT Treatment/Interventions Therapeutic activities;Therapeutic exercise;Balance training;Neuromuscular re-education;Patient/family education;Scar mobilization;Manual techniques;Energy conservation   PT Next Visit Plan knee theraband exercises for HEP   PT Home Exercise Plan knee theraband exercises for HEP   Consulted and Agree with Plan of Care Patient      Patient will benefit from skilled therapeutic intervention in order to improve the following deficits and impairments:  Pain, Decreased strength, Decreased activity tolerance, Decreased endurance, Increased muscle spasms  Visit Diagnosis: Muscle weakness (generalized)  Stiffness of right hip, not elsewhere classified  Other muscle spasm     Problem List Patient Active Problem List   Diagnosis Date Noted  . Diarrhea 05/11/2016  . Dehydration 05/11/2016  . Nausea with vomiting 05/11/2016  . Anxiety 05/11/2016  . Genetic testing 02/19/2016  . Family history of breast cancer   . Family history of uterine cancer   . Rectal cancer ([place]) 12/18/2015    Earlie Counts, PT 07/25/16 4:19 PM   [facility] [address] [place], [place], [address] Phone: [phone]   Fax:  [phone]  Name: Kaitlyn Morgan MRN: [id number] Date of Birth: 11-04-1965

## 2016-07-25 NOTE — Patient Instructions (Addendum)
  PNF Strengthening: Resisted   Lay on foam roll, Standing with resistive band around each hand, bring right arm up and away, thumb back. Repeat _10___ times per set. Do _1___ sets per session. Do _1___ sessions per day.      Resisted Horizontal Abduction: Bilateral   Lay on back on foam roll, Sit or stand, tubing in both hands, arms out in front. Keeping arms straight, pinch shoulder blades together and stretch arms out. Repeat _10___ times per set. Do 1____ sets per session. Do _1___ sessions per day.  Scapular Retraction: Elbow Flexion (Standing)   With elbows bent to 90, pinch shoulder blades together, while holding red band  and rotate arms out, keeping elbows bent. Repeat _10___ times per set. Do _1___ sets per session. Do many__1__ sessions per day.     Arm Sweep: [place]    Get ON TARGET. Lie on flat up roller, feet on full roller. Keeping arms on floor, sweep out and up beyond head. Stop and stretch as tightness develops. Hold _5__ seconds. Repeat _5__ times. Do _1__ sessions per day.   Copyright  VHI. All rights reserved.  Over Head Pull: Wide Grip    On back, knees bent, feet flat, band across thighs, elbows straight but relaxed. Pull hands apart (start). Keeping elbows straight, bring arms up and over head, hands toward floor. Keep steady pull on band. Hold momentarily. Return slowly, keeping pull steady, back to start. Repeat 10___ times. Band color ___red___    Copyright  VHI. All rights reserved.  Bracing With Arms / Legs (Hook-Lying)    Do on foam roll. With neutral spine, tighten pelvic floor and abdominals and hold. Raise arm and opposite leg, then return. Repeat wtih other limbs. Repeat _10__ times. Do _1__ times a day.   Copyright  VHI. All rights reserved.  Half Squat to Chair    Stand with feet shoulder width apart. Push buttocks backward and lower slowly, touching chair lightly and returning to standing position. Complete  _1__ sets of 10___ repetitions. Perform _1__ sessions per day.  http://gtsc.exer.us/437   Copyright  VHI. All rights reserved.  Strengthening: Hip Abduction - Resisted    With tubing around right leg, other side toward anchor, extend leg out from side. Repeat __10__ times per set. Do __2__ sets per session. Do __1__ sessions per day.  http://orth.exer.us/634   Copyright  VHI. All rights reserved.  Strengthening: Hip Extension - Resisted    With tubing around right ankle, face anchor and pull leg straight back. Repeat _10___ times per set. Do __2__ sets per session. Do _1___ sessions per day.  http://orth.exer.us/636   Copyright  VHI. All rights reserved.  Strengthening: Hip Flexion - Resisted    With tubing around left ankle, anchor behind, bring leg forward, keeping knee straight. Repeat _10___ times per set. Do __2__ sets per session. Do __1__ sessions per day.  http://orth.exer.us/638   Copyright  VHI. All rights reserved.  Walk in the house for 30 per day.  [place] [address], [place], [place] [address] Phone # [phone] Fax [phone]

## 2016-07-26 ENCOUNTER — Telehealth: Payer: Self-pay

## 2016-07-26 NOTE — Telephone Encounter (Signed)
S/w pt and gave her information from prior message with [facility] phone call. Gave pt fax number for Dr Ernestina Penna pod if dentist wants more clarification.

## 2016-07-26 NOTE — Telephone Encounter (Signed)
Pt has a chipped tooth and is asking if she needs dental clearance. Reviewed telephone entry of 11/30. attemted to call pt back and unable to leave a message.

## 2016-07-27 ENCOUNTER — Encounter: Payer: Self-pay | Admitting: Physical Therapy

## 2016-07-27 ENCOUNTER — Ambulatory Visit: Payer: 59 | Admitting: Physical Therapy

## 2016-07-27 DIAGNOSIS — M6281 Muscle weakness (generalized): Secondary | ICD-10-CM

## 2016-07-27 DIAGNOSIS — M25651 Stiffness of right hip, not elsewhere classified: Secondary | ICD-10-CM

## 2016-07-27 NOTE — Therapy (Addendum)
[facility] [address] [place], [place], [address] Phone: [phone]   Fax:  [phone]  Physical Therapy Treatment  Patient Details  Name: Kaitlyn Morgan MRN: [id number] Date of Birth: 10/22/1965 Referring Provider: Dr. Truitt Merle  Encounter Date: 07/27/2016      PT End of Session - 07/27/16 1613    Visit Number 6   Date for PT Re-Evaluation 09/05/16   PT Start Time 1530   PT Stop Time 1615   PT Time Calculation (min) 45 min   Activity Tolerance Patient tolerated treatment well   Behavior During Therapy [facility] for tasks assessed/performed      Past Medical History:  Diagnosis Date  . Anemia    during pregnancy, not now.  . 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 Atypical syncope    1 episode -never had follow up with cardiology-never any further episodes, did go to urgent care.  . Colon cancer [facility])    rectal cancer Radiation and oral chemo with radiation.  . Complication of anesthesia   . Family history of breast cancer   . Family history of uterine cancer   . Genital lesion, female    at times  . Heart burn    occ.  Marland Kitchen Hx of seasonal allergies   . Motion sickness   . PONV (postoperative nausea and vomiting)   . Umbilicus discharge     Past Surgical History:  Procedure Laterality Date  . 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 CESAREAN SECTION    . DIAGNOSTIC LAPAROSCOPY     with D and C and Ovarian cystectomy, tubal ligation done at this time '00  . DILATION AND CURETTAGE OF UTERUS    . TUBAL LIGATION    . XI ROBOTIC ASSISTED LOWER ANTERIOR RESECTION N/A 03/21/2016   Procedure: XI ROBOTIC ASSISTED LOWER ANTERIOR RESECTION;  Surgeon: Leighton Ruff, MD;  Location: [facility];  Service: General;  Laterality: N/A;    There were no vitals filed for this visit.      Subjective Assessment - 07/27/16 1539    Subjective I felt good after  last visit.  I am stiff today due to just getting off of work.    Patient Stated Goals increase strength and learn how to exercise   Currently in Pain? Yes   Pain Score 8    Pain Location Back   Pain Orientation Upper;Mid;Lower   Pain Descriptors / Indicators Tightness   Pain Type Acute pain   Pain Onset In the past 7 days   Pain Frequency Intermittent   Aggravating Factors  movement, standing, lifting   Pain Relieving Factors rest   Multiple Pain Sites No                         [facility] Adult PT Treatment/Exercise - 07/27/16 0001      Lumbar Exercises: Stretches   Active Hamstring Stretch 1 rep;30 seconds  bil.    Active Hamstring Stretch Limitations added outside hip then inner thigh 1 time bil. hold 30 seconds   Single Knee to Chest Stretch 30 seconds;1 rep  bil.    Lower Trunk Rotation 2 reps;30 seconds  sitting; right; left     Lumbar Exercises: Machines for Strengthening   Leg Press bil. legs seat #5 60# 2x15     Lumbar Exercises: Supine   Other Supine Lumbar Exercises Lay on  foam roll to decompress 2 min;; y movement to stretch pectoralis with red band; alternate hip flexion  and shoulder flexion 10x each; bil. shoulder horzontal abduction with red band; shoulder diagonals with red band 10x each way;      Knee/Hip Exercises: Aerobic   Elliptical level 1 6 min     Knee/Hip Exercises: Standing   Knee Flexion Strengthening;Right;Left;2 sets;15 reps  5# bil.    Other Standing Knee Exercises walk sideways with redband around ankles 20 feet both ways     Knee/Hip Exercises: Seated   Long Arc Quad Strengthening;Right;Left;2 sets;15 reps   Long Arc Quad Weight 5 lbs.                PT Education - 07/27/16 1613    Education provided No          PT Short Term Goals - 07/21/16 1413      PT SHORT TERM GOAL #1   Title independent with initial HEP   Time 4   Period Weeks   Status Achieved     PT SHORT TERM GOAL #2   Title ability to sit  for 1 hour and moderate stiffness and tightness   Time 4   Period Weeks   Status Achieved     PT SHORT TERM GOAL #3   Title ability to get in and out of bed with moderate difficulty   Time 4   Period Weeks   Status Achieved           PT Long Term Goals - 07/25/16 1612      PT LONG TERM GOAL #1   Title independent with HEP   Time 8   Period Weeks   Status New     PT LONG TERM GOAL #2   Title be able to get up and down from a chair without using hands in 11 sec. due to increased lower extremity strength   Time 8   Period Weeks   Status On-going     PT LONG TERM GOAL #3   Title walking 30 min. 2-3x/week due to increased endurance   Time 8   Period Weeks   Status On-going     PT LONG TERM GOAL #4   Title be able to get out of the bed in the morning with minimal to no diffculty due to increased strength and mobility   Time 8   Period Weeks   Status On-going     PT LONG TERM GOAL #5   Title after sitting for 1 hour able to stand up with minimal to no difficulty     Time 8   Period Weeks   Status On-going               Plan - 07/27/16 1614    Clinical Impression Statement Patient had tightness in her hips and stretches have helped it.  Patient had no fatique with exercises.  Patient able to go 6 min on elliptical. Patient is progressing her exercises. Patient wil lbenefit from skilled therapy to guide her in strenghtening with correct posture.    Rehab Potential Excellent   Clinical Impairments Affecting Rehab Potential s/p radiation  and chemotherapy with Xeloda 12/30/2015-02/08/2016; 03/21/2016 rectosimoid segmental resection   PT Frequency 2x / week   PT Duration 8 weeks   PT Treatment/Interventions Therapeutic activities;Therapeutic exercise;Balance training;Neuromuscular re-education;Patient/family education;Scar mobilization;Manual techniques;Energy conservation   PT Next Visit Plan knee theraband exercises for HEP   PT Home Exercise Plan knee  theraband  exercises for HEP   Consulted and Agree with Plan of Care Patient      Patient will benefit from skilled therapeutic intervention in order to improve the following deficits and impairments:  Pain, Decreased strength, Decreased activity tolerance, Decreased endurance, Increased muscle spasms  Visit Diagnosis: Muscle weakness (generalized)  Stiffness of right hip, not elsewhere classified     Problem List Patient Active Problem List   Diagnosis Date Noted  . Diarrhea 05/11/2016  . Dehydration 05/11/2016  . Nausea with vomiting 05/11/2016  . Anxiety 05/11/2016  . Genetic testing 02/19/2016  . Family history of breast cancer   . Family history of uterine cancer   . Rectal cancer ([place]) 12/18/2015    GRAY,CHERYL 07/27/2016, 4:19 PM  [facility] [address] [place], [place], [address] Phone: [phone]   Fax:  [phone]  Name: Jonni Oelkers MRN: [id number] Date of Birth: 12/02/1965  PHYSICAL THERAPY DISCHARGE SUMMARY  Visits from Start of Care: 6 Current functional level related to goals / functional outcomes: See above. Patient called on 08/11/2016.  She wanted to be discharged due to her new insurance starting in January.    Remaining deficits: See above.   Education / Equipment: HEP Plan: Patient agrees to discharge.  Patient goals were not met. Patient is being discharged due to financial reasons.  Thank you for the referral. Earlie Counts, PT 08/11/16 4:46 PM  ?????

## 2016-08-01 ENCOUNTER — Ambulatory Visit: Payer: 59 | Admitting: Physical Therapy

## 2016-08-03 ENCOUNTER — Encounter: Payer: 59 | Admitting: Physical Therapy

## 2016-08-06 ENCOUNTER — Ambulatory Visit (INDEPENDENT_AMBULATORY_CARE_PROVIDER_SITE_OTHER): Payer: 59 | Admitting: Emergency Medicine

## 2016-08-06 VITALS — BP 110/76 | HR 79 | Temp 98.4°F | Resp 16 | Ht 63.0 in | Wt 176.0 lb

## 2016-08-06 DIAGNOSIS — R059 Cough, unspecified: Secondary | ICD-10-CM

## 2016-08-06 DIAGNOSIS — R05 Cough: Secondary | ICD-10-CM

## 2016-08-06 DIAGNOSIS — J019 Acute sinusitis, unspecified: Secondary | ICD-10-CM | POA: Diagnosis not present

## 2016-08-06 DIAGNOSIS — J069 Acute upper respiratory infection, unspecified: Secondary | ICD-10-CM | POA: Diagnosis not present

## 2016-08-06 MED ORDER — AMOXICILLIN-POT CLAVULANATE 875-125 MG PO TABS: 1.0000 | ORAL_TABLET | Freq: Two times a day (BID) | ORAL | 0 refills | Status: DC

## 2016-08-06 MED ORDER — PREDNISONE 20 MG PO TABS: 20.0000 mg | ORAL_TABLET | Freq: Every day | ORAL | 0 refills | Status: AC

## 2016-08-06 NOTE — Progress Notes (Signed)
Kaitlyn Morgan 50 y.o.   Chief Complaint  Patient presents with  . Cough    X 1 week   . Nasal Congestion    HISTORY OF PRESENT ILLNESS: This is a 50 y.o. female complaining of cough, nasal congestion, and purulent nasal secretions x 1 week, not getting better.  Was seen at another facility last Sunday and was started on Z-pak as well as symptomatic treatment. Not better.   Cough  Associated symptoms include chills, a fever and a sore throat. Pertinent negatives include no chest pain, ear pain, eye redness, hemoptysis, rash, shortness of breath or wheezing. She has tried OTC cough suppressant and rest (Z-pak, Benadryl, Ibuprofen) for the symptoms. The treatment provided no relief. rectal CA     Prior to Admission medications   Medication Sig Start Date End Date Taking? Authorizing Provider  diphenhydrAMINE (BENADRYL) 25 MG tablet Take 25 mg by mouth every 6 (six) hours as needed.   Yes Historical Provider, MD  fluticasone (FLONASE) 50 MCG/ACT nasal spray Place into both nostrils daily.   Yes Historical Provider, MD  Ginger, Zingiber officinalis, (GINGER ROOT PO) Take 1 capsule by mouth as needed.    Yes Historical Provider, MD  ibuprofen (ADVIL,MOTRIN) 100 MG tablet Take 100 mg by mouth every 6 (six) hours as needed for fever.   Yes Historical Provider, MD  naproxen sodium (ANAPROX) 220 MG tablet Take 220 mg by mouth 2 (two) times daily with a meal.   Yes Historical Provider, MD  pseudoephedrine (SUDAFED) 120 MG 12 hr tablet Take 120 mg by mouth 2 (two) times daily.   Yes Historical Provider, MD  valACYclovir (VALTREX) 500 MG tablet take 1 tablet by mouth as needed when has outbreak   for BID x 3 days. 02/24/16  Yes Historical Provider, MD    Allergies  Allergen Reactions  . Codeine Nausea Only  . Morphine And Related Itching  . Sucralose Nausea And Vomiting    Fever  . Zolpidem Tartrate     Hallucinations     Patient Active Problem List   Diagnosis Date Noted  .  Diarrhea 05/11/2016  . Dehydration 05/11/2016  . Nausea with vomiting 05/11/2016  . Anxiety 05/11/2016  . Genetic testing 02/19/2016  . Family history of breast cancer   . Family history of uterine cancer   . Rectal cancer ([place]) 12/18/2015    Past Medical History:  Diagnosis Date  . Anemia    during pregnancy, not now.  . 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 Atypical syncope    1 episode -never had follow up with cardiology-never any further episodes, did go to urgent care.  . Colon cancer [facility])    rectal cancer Radiation and oral chemo with radiation.  . Complication of anesthesia   . Family history of breast cancer   . Family history of uterine cancer   . Genital lesion, female    at times  . Heart burn    occ.  Marland Kitchen Hx of seasonal allergies   . Motion sickness   . PONV (postoperative nausea and vomiting)   . Umbilicus discharge     Past Surgical History:  Procedure Laterality Date  . 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 CESAREAN SECTION    . COLON SURGERY    . DIAGNOSTIC LAPAROSCOPY     with D and C and Ovarian cystectomy, tubal ligation done at this time '00  . DILATION  AND CURETTAGE OF UTERUS    . TUBAL LIGATION    . XI ROBOTIC ASSISTED LOWER ANTERIOR RESECTION N/A 03/21/2016   Procedure: XI ROBOTIC ASSISTED LOWER ANTERIOR RESECTION;  Surgeon: Leighton Ruff, MD;  Location: [facility];  Service: General;  Laterality: N/A;    Social History   Social History  . Marital status: Legally Separated    Spouse name: N/A  . Number of children: 3  . Years of education: N/A   Occupational History  . Not on file.   Social History Main Topics  . Smoking status: Never Smoker  . Smokeless tobacco: Never Used  . Alcohol use No  . Drug use: No  . Sexual activity: Not on file   Other Topics Concern  . Not on file   Social History Narrative  . No narrative on file    Family History  Problem Relation Age  of Onset  . Breast cancer Maternal Grandmother     dx in her 37s  . Cancer Paternal Grandmother 25    uterine cancer   . Hypertension Father   . COPD Father   . Hypertension Sister   . Hypertension Paternal Grandfather   . Heart attack Paternal Grandfather   . Hypertension Sister   . Skin cancer Cousin     maternal first cousin     Review of Systems  Constitutional: Positive for chills, fever and malaise/fatigue.  HENT: Positive for congestion, sinus pain and sore throat. Negative for ear discharge, ear pain and nosebleeds.   Eyes: Negative for blurred vision, discharge and redness.  Respiratory: Positive for cough. Negative for hemoptysis, sputum production, shortness of breath and wheezing.   Cardiovascular: Negative.  Negative for chest pain, palpitations and leg swelling.  Gastrointestinal: Negative.  Negative for abdominal pain, constipation, diarrhea, nausea and vomiting.  Genitourinary: Negative.  Negative for dysuria, frequency and urgency.  Musculoskeletal: Negative.   Skin: Negative for itching and rash.  Neurological: Positive for weakness.  Endo/Heme/Allergies: Negative.   Psychiatric/Behavioral: Negative.      Physical Exam  Constitutional: She is oriented to person, place, and time. She appears well-developed and well-nourished.  HENT:  Head: Normocephalic and atraumatic.  Mouth/Throat: No oropharyngeal exudate.  Oropharyngeal erythema  Eyes: EOM are normal. Pupils are equal, round, and reactive to light. Right eye exhibits no discharge. Left eye exhibits no discharge.  Neck: Normal range of motion. Neck supple.  Cardiovascular: Normal rate, regular rhythm, normal heart sounds and intact distal pulses.   Pulmonary/Chest: Effort normal and breath sounds normal. No respiratory distress.  Abdominal: Soft. Bowel sounds are normal.  Musculoskeletal: Normal range of motion.  Lymphadenopathy:    She has no cervical adenopathy.  Neurological: She is alert and  oriented to person, place, and time.  Skin: Skin is warm and dry.  Psychiatric: She has a normal mood and affect. Her behavior is normal.     ASSESSMENT & PLAN: Suzana was seen today for cough and nasal congestion.  Diagnoses and all orders for this visit:  Upper respiratory tract infection, unspecified type -     Care order/instruction:  Cough  Acute non-recurrent sinusitis, unspecified location  Other orders -     amoxicillin-clavulanate (AUGMENTIN) 875-125 MG tablet; Take 1 tablet by mouth 2 (two) times daily. -     predniSONE (DELTASONE) 20 MG tablet; Take 1 tablet (20 mg total) by mouth daily with breakfast.     Probably is developing secondary bacterial infection.  Agustina Caroli, MD [facility]

## 2016-08-06 NOTE — Patient Instructions (Addendum)
     IF you received an x-ray today, you will receive an invoice from [facility]. Please contact [facility] at [phone] with questions or concerns regarding your invoice.   IF you received labwork today, you will receive an invoice from [facility]. Please contact [facility] at [phone] with questions or concerns regarding your invoice.   Our billing staff will not be able to assist you with questions regarding bills from these companies.  You will be contacted with the lab results as soon as they are available. The fastest way to get your results is to activate your My Chart account. Instructions are located on the last page of this paperwork. If you have not heard from us regarding the results in 2 weeks, please contact this office.      Upper Respiratory Infection, Adult Most upper respiratory infections (URIs) are caused by a virus. A URI affects the nose, throat, and upper air passages. The most common type of URI is often called "the common cold." Follow these instructions at home:  Take medicines only as told by your doctor.  Gargle warm saltwater or take cough drops to comfort your throat as told by your doctor.  Use a warm mist humidifier or inhale steam from a shower to increase air moisture. This may make it easier to breathe.  Drink enough fluid to keep your pee (urine) clear or pale yellow.  Eat soups and other clear broths.  Have a healthy diet.  Rest as needed.  Go back to work when your fever is gone or your doctor says it is okay.  You may need to stay home longer to avoid giving your URI to others.  You can also wear a face mask and wash your hands often to prevent spread of the virus.  Use your inhaler more if you have asthma.  Do not use any tobacco products, including cigarettes, chewing tobacco, or electronic cigarettes. If you need help quitting, ask your doctor. Contact a doctor if:  You are getting worse, not better.  Your  symptoms are not helped by medicine.  You have chills.  You are getting more short of breath.  You have brown or red mucus.  You have yellow or brown discharge from your nose.  You have pain in your face, especially when you bend forward.  You have a fever.  You have puffy (swollen) neck glands.  You have pain while swallowing.  You have white areas in the back of your throat. Get help right away if:  You have very bad or constant:  Headache.  Ear pain.  Pain in your forehead, behind your eyes, and over your cheekbones (sinus pain).  Chest pain.  You have long-lasting (chronic) lung disease and any of the following:  Wheezing.  Long-lasting cough.  Coughing up blood.  A change in your usual mucus.  You have a stiff neck.  You have changes in your:  Vision.  Hearing.  Thinking.  Mood. This information is not intended to replace advice given to you by your health care provider. Make sure you discuss any questions you have with your health care provider. Document Released: 01/18/2008 Document Revised: 04/03/2016 Document Reviewed: 11/06/2013 Elsevier Interactive Patient Education  2017 [employer]  

## 2016-08-09 ENCOUNTER — Encounter: Payer: 59 | Admitting: Physical Therapy

## 2016-08-11 ENCOUNTER — Encounter: Payer: 59 | Admitting: Physical Therapy

## 2016-08-19 ENCOUNTER — Encounter: Payer: Self-pay | Admitting: Hematology

## 2016-08-19 NOTE — Progress Notes (Unsigned)
Faxed medical clearance form to [facility] at [phone]

## 2016-09-08 NOTE — Progress Notes (Signed)
[place]  [phone] [phone]  Clinic follow Up Note   Patient Care Team: Truitt Merle, MD as Consulting Physician (Hematology) Kyung Rudd, MD as Consulting Physician (Radiation Oncology) Jackolyn Confer, MD as Consulting Physician (General Surgery) Richmond Campbell, MD as Consulting Physician (Gastroenterology) Milus Banister, MD as Attending Physician (Gastroenterology) Tania Ade, RN as Registered Nurse Leighton Ruff, MD as Consulting Physician (General Surgery) 09/13/2016   CHIEF COMPLAINTS:  Follow up rectal cancer  Oncology History   Rectal cancer [facility])   Staging form: Colon and Rectum, AJCC 7th Edition     Clinical stage from 12/03/2015: Stage IIIB (T3, N1, M0) - Signed by Truitt Merle, MD on 12/18/2015       Rectal cancer ([place])   12/03/2015 Initial Diagnosis    Rectal cancer ([place])      12/03/2015 Procedure    Colonoscopy showed a partially obstructing tumor in the proximal rectum, its diameter measured 6 mm, biopsed      12/03/2015 Initial Biopsy    Rectum mass biopsy showed adenocarcinoma, moderately differentiated      12/10/2015 Imaging    CT abdomen and pelvis with contrast showed luminal narrowing and mural thickening in the proximal to mid rectum. There is a 6 mm short axis mesorectal lymph node suspicious for metastasis. There are several tiny indeterminate liver lesions       12/17/2015 Imaging    MRI pelvis with and without contrast showed a advanced T3 rectal adenocarcinoma (7cm), N1 disease (at least 2 left-sided nasal rectal lymph nodes are seen measuring 6-7 mm), distance from tumor to the sphincter is 8 cm      12/17/2015 Imaging    CT chest with contrast showed no evidence of metastasis or other changes      12/30/2015 - 02/08/2016 Radiation Therapy    neoadjuvant radiation to rectal cancer       12/30/2015 - 02/08/2016 Chemotherapy    xeloda 1540m twice daily with concurent radiation, she tolerated well       03/21/2016 Surgery    Rectosigmoid segmental resection       03/21/2016 Pathology Results    Invasive well differentiated adenocarcinoma, 5cm,  margins are negative, LVI (-), 1 out of 27 nodes were negative.        05/05/2016 - 05/05/2016 Adjuvant Chemotherapy    Adjuvant CAPOX, she had facial numbness and SOB after she completed the first oxaliplatin infusion, and refused more oxaliplatin        HISTORY OF PRESENTING ILLNESS:  Kaitlyn AOuida Sills51y.o. female is here because of her newly diagnosed rectal cancer. She is accompanied by her daughter and her mother to our multidisciplinary GI clinic today.  She has had chronic abdominal pain for 2-3 years, in the epigastric area and low abdomen, which has been worse lately, and she noticed constipation, abdominal bloating and mild rectal bleeding for 2 months. She was referred to Dr. MEarlean Shawland underwent colonoscopy on 12/03/2015. A partial obstructive rectal mass was found in the proximal rectum, biopsy showed adenocarcinoma. She was referred to surgeon Dr. RZella Richerwho referred her to [place] consider new adjuvant chemoradiation.  She feels well overall, has mild fatigue, but able to tolerate her routine full-time job in daily activities without difficulties. She has good appetite, but eats less due to the epigastric pain after meal. She denies any other significant pain, cough, or other symptoms. No recent weight loss. She has been on phentermine for weight loss recently, and stopped  a few days ago.  CURRENT THERAPY:  Observation  INTERIM HISTORY:  Kaitlyn Morgan returns for follow up. She is doing well today. She had the flu in December, but she is better now. [facility] Her legs are stronger now. She has been having L shoulder pain. She has been taking ibuprofen and using a heating pad. She is bloated, but denies abdominal pain or nausea. She saw her surgeon in December, and everything was good. Denies any  other concerns.   MEDICAL HISTORY:  Past Medical History:  Diagnosis Date  . Anemia    during pregnancy, not now.  . 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 Atypical syncope    1 episode -never had follow up with cardiology-never any further episodes, did go to urgent care.  . Colon cancer [facility])    rectal cancer Radiation and oral chemo with radiation.  . Complication of anesthesia   . Family history of breast cancer   . Family history of uterine cancer   . Genital lesion, female    at times  . Heart burn    occ.  Marland Kitchen Hx of seasonal allergies   . Motion sickness   . PONV (postoperative nausea and vomiting)   . Rectal cancer ([place])   . Umbilicus discharge     SURGICAL HISTORY: Past Surgical History:  Procedure Laterality Date  . 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 CESAREAN SECTION    . COLON SURGERY    . DIAGNOSTIC LAPAROSCOPY     with D and C and Ovarian cystectomy, tubal ligation done at this time '00  . DILATION AND CURETTAGE OF UTERUS    . TUBAL LIGATION    . XI ROBOTIC ASSISTED LOWER ANTERIOR RESECTION N/A 03/21/2016   Procedure: XI ROBOTIC ASSISTED LOWER ANTERIOR RESECTION;  Surgeon: Leighton Ruff, MD;  Location: [facility];  Service: General;  Laterality: N/A;    SOCIAL HISTORY: Social History   Social History  . Marital status: Legally Separated    Spouse name: N/A  . Number of children: 3  . Years of education: N/A   Occupational History  . Not on file.   Social History Main Topics  . Smoking status: Never Smoker  . Smokeless tobacco: Never Used  . Alcohol use No  . Drug use: No  . Sexual activity: Not on file   Other Topics Concern  . Not on file   Social History Narrative  . No narrative on file   She has 3 children, she lives with her twin sons who are 70. She works for [employer]   FAMILY HISTORY: Family History  Problem Relation Age of Onset  . Breast cancer Maternal  Grandmother     dx in her 40s  . Cancer Paternal Grandmother 41    uterine cancer   . Hypertension Father   . COPD Father   . Hypertension Sister   . Hypertension Paternal Grandfather   . Heart attack Paternal Grandfather   . Hypertension Sister   . Skin cancer Cousin     maternal first cousin    ALLERGIES:  is allergic to codeine; morphine and related; sucralose; and zolpidem tartrate.  MEDICATIONS:  Current Outpatient Prescriptions  Medication Sig Dispense Refill  . diphenhydrAMINE (BENADRYL) 25 MG tablet Take 25 mg by mouth every 6 (six) hours as needed.    . fluticasone (FLONASE) 50 MCG/ACT  nasal spray Place into both nostrils daily.    . Ginger, Zingiber officinalis, (GINGER ROOT PO) Take 1 capsule by mouth as needed.     Marland Kitchen ibuprofen (ADVIL,MOTRIN) 100 MG tablet Take 100 mg by mouth every 6 (six) hours as needed for fever.    . naproxen sodium (ANAPROX) 220 MG tablet Take 220 mg by mouth 2 (two) times daily with a meal.    . pseudoephedrine (SUDAFED) 120 MG 12 hr tablet Take 120 mg by mouth 2 (two) times daily.    . valACYclovir (VALTREX) 500 MG tablet take 1 tablet by mouth as needed when has outbreak   for BID x 3 days.  4   No current facility-administered medications for this visit.     REVIEW OF SYSTEMS:   Constitutional: Denies fevers, chills or abnormal night sweats Eyes: Denies blurriness of vision, double vision or watery eyes Ears, nose, mouth, throat, and face: Denies mucositis or sore throat Respiratory: Denies cough, dyspnea or wheezes Cardiovascular: Denies palpitation, chest discomfort or lower extremity swelling Gastrointestinal:  Denies nausea, heartburn or change in bowel habits (+) bloating.  Skin: Denies abnormal skin rashes Lymphatics: Denies new lymphadenopathy or easy bruising Neurological:Denies numbness, tingling or new weaknesses Musculoskeletal: (+) L shoulder pain Behavioral/Psych: Mood is stable, no new changes  All other systems were  reviewed with the patient and are negative.  PHYSICAL EXAMINATION: ECOG PERFORMANCE STATUS: 1 - Symptomatic but completely ambulatory  Vitals:   09/13/16 1540  BP: 131/80  Pulse: 73  Resp: 18  Temp: 98.2 F (36.8 C)   Filed Weights   09/13/16 1540  Weight: 184 lb 8 oz (83.7 kg)   GENERAL:alert, no distress and comfortable SKIN: skin color, texture, turgor are normal, no rashes or significant lesions EYES: normal, conjunctiva are pink and non-injected, sclera clear OROPHARYNX:no exudate, no erythema and lips, buccal mucosa, and tongue normal  NECK: supple, thyroid normal size, non-tender, without nodularity LYMPH:  no palpable lymphadenopathy in the cervical, axillary or inguinal LUNGS: clear to auscultation and percussion with normal breathing effort HEART: regular rate & rhythm and no murmurs and no lower extremity edema ABDOMEN:abdomen soft, non-tender and normal bowel sounds. Incision site well healed.  Musculoskeletal:no cyanosis of digits and no clubbing  PSYCH: alert & oriented x 3 with fluent speech NEURO: no focal motor/sensory deficits  LABORATORY DATA:  I have reviewed the data as listed CBC Latest Ref Rng & Units 09/13/2016 06/13/2016 05/26/2016  WBC 3.9 - 10.3 10e3/uL 3.9 2.3(L) 1.9(L)  Hemoglobin 11.6 - 15.9 g/dL 12.5 12.4 11.7  Hematocrit 34.8 - 46.6 % 37.1 37.9 35.6  Platelets 145 - 400 10e3/uL 252 226 190    CMP Latest Ref Rng & Units 09/13/2016 06/13/2016 05/26/2016  Glucose 70 - 140 mg/dl 86 76 76  BUN 7.0 - 26.0 mg/dL 9.5 9.5 7.7  Creatinine 0.6 - 1.1 mg/dL 0.8 0.8 0.7  Sodium 136 - 145 mEq/L 143 143 143  Potassium 3.5 - 5.1 mEq/L 3.7 4.0 3.9  Chloride 101 - 111 mmol/L - - -  CO2 22 - 29 mEq/L _0 Calcium 8.4 - 10.4 mg/dL 9.8 9.7 9.2  Total Protein 6.4 - 8.3 g/dL 7.7 7.5 6.8  Total Bilirubin 0.20 - 1.20 mg/dL 0.79 0.80 0.59  Alkaline Phos 40 - 150 U/L 95 66 58  AST 5 - 34 U/L 23 23 32  ALT 0 - 55 U/L _1 CEA 12/18/2015:  0.9 04/27/2016: 0.6 09/13/2016: 0.8  PATHOLOGY  REPORT Diagnosis 12/03/2015 Colon, sigmoid, mass, biopsy -Invasive adenocarcinoma, moderately differentiated. See comment.  Comment: due to the presence of adenocarcinoma, IHC for DNA mismatch repair proteins will be performed and reported in an addendum.  Diagnosis 03/21/2016 1. Colon, segmental resection for tumor, rectosigmoid - INVASIVE WELL DIFFERENTIATED ADENOCARCINOMA, SPANNING 5 CM IN GREATEST DIMENSION. - TUMOR INVADES THROUGH MUSCULARIS PROPRIA TO INVOLVE SUBSEROSAL SOFT TISSUES. - MARGINS ARE NEGATIVE. - ONE OF TWENTY-SEVEN LYMPH NODES IS POSITIVE FOR METASTATIC ADENOCARCINOMA (1/27). - SEE ONCOLOGY TEMPLATE. 2. Colon, resection margin (donut), distal rectal, final margin - BENIGN COLORECTAL MUCOSA. - NO TUMOR SEEN. Microscopic Comment 1. COLON AND RECTUM (INCLUDING TRANS-ANAL RESECTION): Specimen: Rectosigmoid colon. Procedure: Robotic lower anterior resection. Tumor site: Rectum. Specimen integrity: Intact. Macroscopic intactness of mesorectum: Complete. Macroscopic tumor perforation: Not identified. Invasive tumor: Maximum size: 5 cm. Histologic type(s): Adenocarcinoma. Histologic grade and differentiation: G1: well differentiated/low grade. Type of polyp in which invasive carcinoma arose: Tubular adenoma with high grade dysplasia. Microscopic extension of invasive tumor: Tumor invades through muscularis propria to involve subserosal soft tissues. Lymph-Vascular invasion: Although definitive lymph/vascular invasion is not identified, there is lymph node positivity (see below). Peri-neural invasion: None identified. Tumor deposit(s) (discontinuous extramural extension): None identified. Resection margins: Proximal margin: Negative. Distal margin: Negative. Circumferential (radial) (posterior ascending, posterior descending; lateral and posterior mid-rectum; and entire lower 1/3 rectum): Negative. Mesenteric margin:  Not applicable. Distance closest margin (if all above margins negative): .2.4 cm (distal margin) Trans-anal resection margins only: Not applicable. Deep margin: Not applicable. Mucosal Margin: Not applicable. Distance closest mucosal margin (if negative): Not applicable. Treatment effect (neo-adjuvant therapy): Minimal treatment effect is present in the primary tumor. Minimal treatment effect is also present in the positive lymph nodes with the other lymph nodes showing no degree of treatment effect. Additional polyp(s): No additional polyps identified. Non-neoplastic findings: Diverticulosis. Lymph nodes: number examined 27; number positive: 1. Pathologic Staging: ypT3, ypN1a. Ancillary studies: As the patient is status-post neoadjuvant therapy, MSI by PCR and MMR by [facility] will not be sent.   RADIOGRAPHIC STUDIES: I have personally reviewed the radiological images as listed and agreed with the findings in the report.  CT Chest w/ Contrast 03/14/2016 IMPRESSION: 1. No acute findings within the abdomen or pelvis. No evidence for metastatic disease. 2. Mild wall thickening involving the distal sigmoid colon and rectum is identified. Nonspecific and may reflect sequelae of external beam radiation.  COLONOSCOPY DR. MEDOFF 12/03/2015  Impression Malignant partially obstructing tumor in the proximal rectum, its diameter measured 6 mm, biopsy. Distal margin at 15 cm.    ASSESSMENT & PLAN:  51 y.o. Caucasian female, without significant past medical history, presented with abdominal pain and rectal bleeding. Colonoscopy showed a obstructing tumor in the proximal rectum.  1. Rectal adenocarcinoma, proximal rectum, cT3N1M0, ypT3N1aM0, stage IIIB, moderately differentiated --I previously reviewed her surgical path findings with pt in details -she has significant residual disease after neoadjuvant chemo and radiaiton -I previously discussed the high risk of cancer recurrence after surgery, I  recommend adjuvant chemo -we discussed the option of FOLFOX and CAPOX. She opted CAPOX  -She has some neurological symptoms after oxaliplatin infusion, and refuses to try again with premedication. She states she is not going to regret even if she had cancer recurrence -I encouraged her to consider single agent Xeloda, which she took with radiation before. She declined. --I previously discussed the risk of cancer recurrence in the future. I discussed the surveillance plan, which is a physical exam and lab  test (including CBC, CMP and CEA) every 3 months for the first 2 years, then every 6-12 months, colonoscopy in one year, and surveilliance CT scan every 6-12 month for up to 5 year. She is agreeable with the plan. -Lab results reviewed with her. -She is clinically doing well, exam is unremarkable, no clinical concern for recurrence. -We'll repeat colonoscopy and surveillance CT scan in April -I encouraged her to eat healthy, and gradually increase her physical activity levels, and exercise, to recover better.  2. Fatigue  -Possibly related to her surgery and chemotherapy, she is recovering slowly -I strongly encouraged her to gradually increase her activity level, and exercise regularly, to get her strength back  3. Genetics -she was seen by Dietitian, and her genetic test was negative for lynch syndrome or the following 32 genes: APC, ATM, AXIN2, BARD1, BMPR1A, BRCA1, BRCA2, BRIP1, CDH1, CDK4, CDKN2A, CHEK2, EPCAM, FANCC, MLH1, MSH2, MSH6, MUTYH, NBN, PALB2, PMS2, POLD1, POLE, PTEN, RAD51C, RAD51D, SCG5/GREM1, SMAD4, STK11, TP53, VHL, and XRCC2.    Plan -Colonoscopy in April 2018.  -Repeat CT scan in April 2018.  -Return to clinic in May 2018.   All questions were answered. The patient knows to call the clinic with any problems, questions or concerns.  I spent 20 minutes counseling the patient face to face. The total time spent in the appointment was 25 minutes and more than 50% was  on counseling.   This document serves as a record of services personally performed by Truitt Merle, MD. It was created on her behalf by Martinique Casey, a trained medical scribe. The creation of this record is based on the scribe's personal observations and the provider's statements to them. This document has been checked and approved by the attending provider.  I have reviewed the above documentation for accuracy and completeness and I agree with the above.   Truitt Merle, MD 09/13/2016

## 2016-09-13 ENCOUNTER — Telehealth: Payer: Self-pay | Admitting: Hematology

## 2016-09-13 ENCOUNTER — Other Ambulatory Visit (HOSPITAL_BASED_OUTPATIENT_CLINIC_OR_DEPARTMENT_OTHER): Payer: 59

## 2016-09-13 ENCOUNTER — Ambulatory Visit (HOSPITAL_BASED_OUTPATIENT_CLINIC_OR_DEPARTMENT_OTHER): Payer: 59 | Admitting: Hematology

## 2016-09-13 VITALS — BP 131/80 | HR 73 | Temp 98.2°F | Resp 18 | Ht 63.0 in | Wt 184.5 lb

## 2016-09-13 DIAGNOSIS — C20 Malignant neoplasm of rectum: Secondary | ICD-10-CM

## 2016-09-13 DIAGNOSIS — F419 Anxiety disorder, unspecified: Secondary | ICD-10-CM

## 2016-09-13 LAB — CBC WITH DIFFERENTIAL/PLATELET
BASO%: 0.9 % (ref 0.0–2.0)
Basophils Absolute: 0 10*3/uL (ref 0.0–0.1)
EOS%: 1.9 % (ref 0.0–7.0)
Eosinophils Absolute: 0.1 10*3/uL (ref 0.0–0.5)
HCT: 37.1 % (ref 34.8–46.6)
HGB: 12.5 g/dL (ref 11.6–15.9)
LYMPH%: 18.1 % (ref 14.0–49.7)
MCH: 29.7 pg (ref 25.1–34.0)
MCHC: 33.7 g/dL (ref 31.5–36.0)
MCV: 88.1 fL (ref 79.5–101.0)
MONO#: 0.3 10*3/uL (ref 0.1–0.9)
MONO%: 8.7 % (ref 0.0–14.0)
NEUT#: 2.7 10*3/uL (ref 1.5–6.5)
NEUT%: 70.4 % (ref 38.4–76.8)
Platelets: 252 10*3/uL (ref 145–400)
RBC: 4.21 10*6/uL (ref 3.70–5.45)
RDW: 15 % — ABNORMAL HIGH (ref 11.2–14.5)
WBC: 3.9 10*3/uL (ref 3.9–10.3)
lymph#: 0.7 10*3/uL — ABNORMAL LOW (ref 0.9–3.3)

## 2016-09-13 LAB — COMPREHENSIVE METABOLIC PANEL
ALT: 16 U/L (ref 0–55)
AST: 23 U/L (ref 5–34)
Albumin: 4.3 g/dL (ref 3.5–5.0)
Alkaline Phosphatase: 95 U/L (ref 40–150)
Anion Gap: 10 mEq/L (ref 3–11)
BUN: 9.5 mg/dL (ref 7.0–26.0)
CO2: 28 mEq/L (ref 22–29)
Calcium: 9.8 mg/dL (ref 8.4–10.4)
Chloride: 105 mEq/L (ref 98–109)
Creatinine: 0.8 mg/dL (ref 0.6–1.1)
EGFR: >90 mL/min/{1.73_m2} (ref >90)
Glucose: 86 mg/dl (ref 70–140)
Potassium: 3.7 mEq/L (ref 3.5–5.1)
Sodium: 143 mEq/L (ref 136–145)
Total Bilirubin: 0.79 mg/dL (ref 0.20–1.20)
Total Protein: 7.7 g/dL (ref 6.4–8.3)

## 2016-09-13 NOTE — Telephone Encounter (Signed)
Appointments scheduled per 09/13/16 los. Patient was given a copy of the AVS report and appointment schedule per 09/13/16 los.

## 2016-09-14 ENCOUNTER — Encounter: Payer: Self-pay | Admitting: Hematology

## 2016-09-14 LAB — CEA: CEA: 0.8 ng/mL (ref 0.0–4.7)

## 2016-09-14 LAB — CEA (IN HOUSE-CHCC): CEA (CHCC-In House): 1.08 ng/mL (ref 0.00–5.00)

## 2016-09-16 ENCOUNTER — Telehealth: Payer: Self-pay | Admitting: *Deleted

## 2016-09-16 NOTE — Telephone Encounter (Signed)
Received vm call from pt stating that she had lab done tues & wants to know result of cancer biomarker.   Discussed with Dr Burr Medico & pt informed of good result of CEA.

## 2016-09-26 ENCOUNTER — Telehealth: Payer: Self-pay | Admitting: *Deleted

## 2016-09-26 NOTE — Telephone Encounter (Signed)
Pt called and left message requesting a call back from nurse.  Spoke with pt and was informed re:  Pt has had severe joint related pain for several months.  Pt discussed with her GYN, and was told it is menopause related pain. Pt was prescribed  Estradiol by GYN;  Pt was instructed to check with Dr. Burr Medico to make sure it is ok for pt to take. Informed pt that Dr. Burr Medico is out of office until Tues 10/04/16. Pt stated she would really like to start taking Estradiol to help ease joint related pain;  However, pt will wait to hear back from Dr. Burr Medico. Pt's    Phone       [phone].

## 2016-10-03 NOTE — Telephone Encounter (Signed)
Please let pt know that it's OK to take estradiol. It does not affect her risk of rectal cancer recurrence, although it slightly increase the risk of breast cancer.   Truitt Merle MD

## 2016-10-06 NOTE — Telephone Encounter (Signed)
Called pt and left message on voice mail requesting a call back from pt for Dr. Ernestina Penna response.

## 2016-12-01 ENCOUNTER — Ambulatory Visit (INDEPENDENT_AMBULATORY_CARE_PROVIDER_SITE_OTHER): Payer: 59 | Admitting: Physician Assistant

## 2016-12-01 VITALS — BP 108/71 | HR 80 | Temp 98.3°F | Resp 16 | Ht 63.0 in | Wt 193.6 lb

## 2016-12-01 DIAGNOSIS — R198 Other specified symptoms and signs involving the digestive system and abdomen: Secondary | ICD-10-CM

## 2016-12-01 DIAGNOSIS — R609 Edema, unspecified: Secondary | ICD-10-CM

## 2016-12-01 DIAGNOSIS — R438 Other disturbances of smell and taste: Secondary | ICD-10-CM | POA: Diagnosis not present

## 2016-12-01 MED ORDER — RANITIDINE HCL 150 MG PO TABS: 150.0000 mg | ORAL_TABLET | Freq: Two times a day (BID) | ORAL | 1 refills | Status: DC

## 2016-12-01 MED ORDER — OMEPRAZOLE 20 MG PO CPDR: 20.0000 mg | DELAYED_RELEASE_CAPSULE | Freq: Every day | ORAL | 3 refills | Status: DC

## 2016-12-01 NOTE — Patient Instructions (Addendum)
I would like you to take this medication as prescribed.  I am treating you for possible reflux.  Please review the diet listed below. I will contact you with the results of the blood work.  I would also like you to await contact for the referral to the gastroenterologist.    Food Choices for Gastroesophageal Reflux Disease, Adult When you have gastroesophageal reflux disease (GERD), the foods you eat and your eating habits are very important. Choosing the right foods can help ease your discomfort. What guidelines do I need to follow?  Choose fruits, vegetables, whole grains, and low-fat dairy products.  Choose low-fat meat, fish, and poultry.  Limit fats such as oils, salad dressings, butter, nuts, and avocado.  Keep a food diary. This helps you identify foods that cause symptoms.  Avoid foods that cause symptoms. These may be different for everyone.  Eat small meals often instead of 3 large meals a day.  Eat your meals slowly, in a place where you are relaxed.  Limit fried foods.  Cook foods using methods other than frying.  Avoid drinking alcohol.  Avoid drinking large amounts of liquids with your meals.  Avoid bending over or lying down until 2-3 hours after eating. What foods are not recommended? These are some foods and drinks that may make your symptoms worse: Vegetables  Tomatoes. Tomato juice. Tomato and spaghetti sauce. Chili peppers. Onion and garlic. Horseradish. Fruits  Oranges, grapefruit, and lemon (fruit and juice). Meats  High-fat meats, fish, and poultry. This includes hot dogs, ribs, ham, sausage, salami, and bacon. Dairy  Whole milk and chocolate milk. Sour cream. Cream. Butter. Ice cream. Cream cheese. Drinks  Coffee and tea. Bubbly (carbonated) drinks or energy drinks. Condiments  Hot sauce. Barbecue sauce. Sweets/Desserts  Chocolate and cocoa. Donuts. Peppermint and spearmint. Fats and Oils  High-fat foods. This includes [place] fries and potato  chips. Other  Vinegar. Strong spices. This includes black pepper, white pepper, red pepper, cayenne, curry powder, cloves, ginger, and chili powder. The items listed above may not be a complete list of foods and drinks to avoid. Contact your dietitian for more information.  This information is not intended to replace advice given to you by your health care provider. Make sure you discuss any questions you have with your health care provider. Document Released: 01/31/2012 Document Revised: 01/07/2016 Document Reviewed: 06/05/2013 Elsevier Interactive Patient Education  2017 [employer].     IF you received an x-ray today, you will receive an invoice from [facility]. Please contact [facility] at [phone] with questions or concerns regarding your invoice.   IF you received labwork today, you will receive an invoice from [place]. Please contact [facility] at [phone] with questions or concerns regarding your invoice.   Our billing staff will not be able to assist you with questions regarding bills from these companies.  You will be contacted with the lab results as soon as they are available. The fastest way to get your results is to activate your My Chart account. Instructions are located on the last page of this paperwork. If you have not heard from [place] regarding the results in 2 weeks, please contact this office.

## 2016-12-01 NOTE — Progress Notes (Signed)
PRIMARY CARE AT [facility] [address], [place] [address] [phone]  Date:  12/01/2016   Name:  Kaitlyn Morgan   DOB:  January 31, 1966   MRN:  [id number]  PCP:  No PCP Per Patient    History of Present Illness:  Kaitlyn Morgan is a 51 y.o. female patient who presents to PCP with  Chief Complaint  Patient presents with  . Edema    Pt states she feel like she is retaining fluid/swelling all over x 2 months  . Nausea  . other    States she has notice yellow discharge from her naval      --fluid leaking from [place] started clear or white for about 3 years Now in the past 2 months, it is yellow, crust over.  She will put a cotton ball with brown dishcarge.  She feels very bloated and weight in her knees.  She has been avoid in exercising.    She has tried etOH, peroxide, vinegar, and anti-fungal.  No abdominal pain.  She feels like she barely has an appetite.  She has had some emesis with one episode 1 week ago.  Feeling weak.  She has no fever, though she recalls her teeth chattering and cold.  May get minimally lightheaded.   Hx of rectal cancer radiation, lymph node removal, and chemotherapy late 2017.   She has a bitter taste in her throat, and nausea.  She is having headaches.   She has no known pattern to when she has the nausea and bitter taste.   No constipation, no change in skin or rash.  No sob or dyspnea.  No chest pains.   Hysterectomy 2003  Wt Readings from Last 3 Encounters:  12/01/16 193 lb 9.6 oz (87.8 kg)  09/13/16 184 lb 8 oz (83.7 kg)  08/06/16 176 lb (79.8 kg)     Patient Active Problem List   Diagnosis Date Noted  . Diarrhea 05/11/2016  . Dehydration 05/11/2016  . Nausea with vomiting 05/11/2016  . Anxiety 05/11/2016  . Genetic testing 02/19/2016  . Family history of breast cancer   . Family history of uterine cancer   . Rectal cancer ([place]) 12/18/2015    Past Medical History:  Diagnosis Date  . Anemia    during pregnancy, not now.  . Arthritis     osteoarthritis-neck"some issues with cervical vertebrae"  . Atypical syncope    1 episode -never had follow up with cardiology-never any further episodes, did go to urgent care.  . Colon cancer [facility])    rectal cancer Radiation and oral chemo with radiation.  . Complication of anesthesia   . Family history of breast cancer   . Family history of uterine cancer   . Genital lesion, female    at times  . Heart burn    occ.  Marland Kitchen Hx of seasonal allergies   . Motion sickness   . PONV (postoperative nausea and vomiting)   . Rectal cancer ([place])   . Umbilicus discharge     Past Surgical History:  Procedure Laterality Date  . 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 CESAREAN SECTION    . COLON SURGERY    . DIAGNOSTIC LAPAROSCOPY     with D and C and Ovarian cystectomy, tubal ligation done at this time '00  . DILATION AND CURETTAGE OF UTERUS    . TUBAL LIGATION    . XI ROBOTIC ASSISTED LOWER ANTERIOR RESECTION N/A 03/21/2016  Procedure: XI ROBOTIC ASSISTED LOWER ANTERIOR RESECTION;  Surgeon: Leighton Ruff, MD;  Location: [facility];  Service: General;  Laterality: N/A;    Social History  Substance Use Topics  . Smoking status: Never Smoker  . Smokeless tobacco: Never Used  . Alcohol use No    Family History  Problem Relation Age of Onset  . Breast cancer Maternal Grandmother     dx in her 79s  . Cancer Paternal Grandmother 84    uterine cancer   . Hypertension Father   . COPD Father   . Hypertension Sister   . Hypertension Paternal Grandfather   . Heart attack Paternal Grandfather   . Hypertension Sister   . Skin cancer Cousin     maternal first cousin    Allergies  Allergen Reactions  . Codeine Nausea Only  . Morphine And Related Itching  . Sucralose Nausea And Vomiting    Fever  . Zolpidem Tartrate     Hallucinations     Medication list has been reviewed and updated.  Current Outpatient Prescriptions on File Prior to Visit  Medication Sig  Dispense Refill  . diphenhydrAMINE (BENADRYL) 25 MG tablet Take 25 mg by mouth every 6 (six) hours as needed.    . fluticasone (FLONASE) 50 MCG/ACT nasal spray Place into both nostrils daily.    . Ginger, Zingiber officinalis, (GINGER ROOT PO) Take 1 capsule by mouth as needed.     Marland Kitchen ibuprofen (ADVIL,MOTRIN) 100 MG tablet Take 100 mg by mouth every 6 (six) hours as needed for fever.    . naproxen sodium (ANAPROX) 220 MG tablet Take 220 mg by mouth 2 (two) times daily with a meal.    . valACYclovir (VALTREX) 500 MG tablet take 1 tablet by mouth as needed when has outbreak   for BID x 3 days.  4   No current facility-administered medications on file prior to visit.     ROS ROS otherwise unremarkable unless listed above.  Physical Examination: BP 108/71   Pulse 80   Temp 98.3 F (36.8 C) (Oral)   Resp 16   Ht '5\' 3"'  (1.6 m)   Wt 193 lb 9.6 oz (87.8 kg)   SpO2 100%   BMI 34.29 kg/m  Ideal Body Weight: Weight in (lb) to have BMI = 25: 140.8  Physical Exam  Constitutional: She is oriented to person, place, and time. She appears well-developed and well-nourished. No distress.  HENT:  Head: Normocephalic and atraumatic.  Right Ear: External ear normal.  Left Ear: External ear normal.  Eyes: Conjunctivae and EOM are normal. Pupils are equal, round, and reactive to light.  Cardiovascular: Normal rate, regular rhythm and normal heart sounds.  Exam reveals no gallop and no friction rub.   No murmur heard. No lower extremity edema.  No pitting.  Normal 2+ dorsalis pedis.    Pulmonary/Chest: Effort normal. No apnea. She has no decreased breath sounds. She has no wheezes. She has no rhonchi.  Musculoskeletal:  Naval with malodorous drainage minimal.  Appears to be round soft tissue visualized with otoscope, in the naval, without necrosis.  Whitish yellow scant tissue also seen.      Neurological: She is alert and oriented to person, place, and time.  Skin: She is not diaphoretic.   Psychiatric: She has a normal mood and affect. Her behavior is normal.   CT-scan of the abdomen: in review, there appears to be some sort of pocketing outside of the abdominal cavity.  Assessment and Plan:  Goldie Tregoning is a 51 y.o. female who is here today for naval drainage and fatigue.   amb ref to gen surgery.  Difficulty to see if this is a polyp, an open entry way to the musculature, or possible pocketed cyst/abscess through physical exam and imaging.  Recommend this be evaluated by gen surgery.  Thank you.   Lab obtained regarding fatigue.  Possible depressive state, given stressors discussed at visit as well as menopausal state.  If the labs are normal, she is considering medication for this, and I think this is an appropriate choice.  Likely will choose fluoxetine with follow up. Edema, unspecified type - Plan: CBC with Differential/Platelet, Sedimentation rate, TSH, CMP14+EGFR  Bitter taste - Plan: omeprazole (PRILOSEC) 20 MG capsule, ranitidine (ZANTAC) 375 MG tablet  Umbilicus discharge - Plan: Ambulatory referral to General Surgery  Ivar Drape, PA-C [facility] 4/21/20189:56 AM

## 2016-12-02 LAB — CBC WITH DIFFERENTIAL/PLATELET
Basophils Absolute: 0 10*3/uL (ref 0.0–0.2)
Basos: 0 %
EOS (ABSOLUTE): 0.1 10*3/uL (ref 0.0–0.4)
Eos: 3 %
Hematocrit: 37.4 % (ref 34.0–46.6)
Hemoglobin: 12.3 g/dL (ref 11.1–15.9)
Immature Grans (Abs): 0 10*3/uL (ref 0.0–0.1)
Immature Granulocytes: 0 %
Lymphocytes Absolute: 0.8 10*3/uL (ref 0.7–3.1)
Lymphs: 18 %
MCH: 27.8 pg (ref 26.6–33.0)
MCHC: 32.9 g/dL (ref 31.5–35.7)
MCV: 85 fL (ref 79–97)
Monocytes Absolute: 0.3 10*3/uL (ref 0.1–0.9)
Monocytes: 8 %
Neutrophils Absolute: 3.1 10*3/uL (ref 1.4–7.0)
Neutrophils: 71 %
Platelets: 225 10*3/uL (ref 150–379)
RBC: 4.42 x10E6/uL (ref 3.77–5.28)
RDW: 14.5 % (ref 12.3–15.4)
WBC: 4.3 10*3/uL (ref 3.4–10.8)

## 2016-12-02 LAB — CMP14+EGFR
ALT: 11 IU/L (ref 0–32)
AST: 17 IU/L (ref 0–40)
Albumin/Globulin Ratio: 1.5 (ref 1.2–2.2)
Albumin: 4.3 g/dL (ref 3.5–5.5)
Alkaline Phosphatase: 108 IU/L (ref 39–117)
BUN/Creatinine Ratio: 24 — ABNORMAL HIGH (ref 9–23)
BUN: 18 mg/dL (ref 6–24)
Bilirubin Total: 0.6 mg/dL (ref 0.0–1.2)
CO2: 27 mmol/L (ref 18–29)
Calcium: 9.2 mg/dL (ref 8.7–10.2)
Chloride: 100 mmol/L (ref 96–106)
Creatinine, Ser: 0.76 mg/dL (ref 0.57–1.00)
GFR calc Af Amer: 107 mL/min/{1.73_m2} (ref >59)
GFR calc non Af Amer: 92 mL/min/{1.73_m2} (ref >59)
Globulin, Total: 2.8 g/dL (ref 1.5–4.5)
Glucose: 103 mg/dL — ABNORMAL HIGH (ref 65–99)
Potassium: 3.8 mmol/L (ref 3.5–5.2)
Sodium: 141 mmol/L (ref 134–144)
Total Protein: 7.1 g/dL (ref 6.0–8.5)

## 2016-12-02 LAB — SEDIMENTATION RATE: Sed Rate: 12 mm/hr (ref 0–32)

## 2016-12-02 LAB — TSH: TSH: 2.1 u[IU]/mL (ref 0.450–4.500)

## 2016-12-08 ENCOUNTER — Telehealth: Payer: Self-pay | Admitting: Hematology

## 2016-12-08 NOTE — Telephone Encounter (Signed)
Patient called and got her CT scheduled for 4/30.  She was told by a nurse that she should probably move her appointment up from 5/4 due to her being sick and having and upcoming surgery. Please call patient either way

## 2016-12-08 NOTE — Telephone Encounter (Signed)
Spoke with pt and was informed that pt saw her PCP last week and was told that pt has an open sore around [place] area that might be infected.  Pt was referred to a surgeon; pt is still waiting to hear back from surgeon's office for appt. Pt has had constant nausea - same symptom when pt first got diagnosis of cancer.   Pt wanted to know if her upcoming CT scan would include liver area.  Informed pt that CT A/P would include liver section. Schedule message sent to move f/u appt to a sooner date.

## 2016-12-12 ENCOUNTER — Ambulatory Visit (HOSPITAL_COMMUNITY)
Admission: RE | Admit: 2016-12-12 | Discharge: 2016-12-12 | Disposition: A | Discharge status: Home / Self Care | Payer: 59 | Source: Ambulatory Visit | Attending: Hematology | Admitting: Hematology

## 2016-12-12 ENCOUNTER — Other Ambulatory Visit: Payer: 59

## 2016-12-12 ENCOUNTER — Encounter (HOSPITAL_COMMUNITY): Payer: Self-pay

## 2016-12-12 DIAGNOSIS — C20 Malignant neoplasm of rectum: Secondary | ICD-10-CM

## 2016-12-12 DIAGNOSIS — Z9889 Other specified postprocedural states: Secondary | ICD-10-CM | POA: Diagnosis not present

## 2016-12-12 DIAGNOSIS — K769 Liver disease, unspecified: Secondary | ICD-10-CM | POA: Diagnosis not present

## 2016-12-12 MED ORDER — IOPAMIDOL (ISOVUE-300) INJECTION 61%
100.0000 mL | Freq: Once | INTRAVENOUS | Status: AC | PRN
  Administered 2016-12-12: 100 mL via INTRAVENOUS

## 2016-12-12 MED ORDER — IOPAMIDOL (ISOVUE-300) INJECTION 61%
INTRAVENOUS | Status: AC
  Filled 2016-12-12: qty 100

## 2016-12-13 ENCOUNTER — Ambulatory Visit (HOSPITAL_BASED_OUTPATIENT_CLINIC_OR_DEPARTMENT_OTHER): Payer: 59 | Admitting: Hematology

## 2016-12-13 ENCOUNTER — Encounter: Payer: Self-pay | Admitting: Hematology

## 2016-12-13 VITALS — BP 109/77 | HR 65 | Temp 97.9°F | Resp 18 | Ht 63.0 in | Wt 190.8 lb

## 2016-12-13 DIAGNOSIS — Z85048 Personal history of other malignant neoplasm of rectum, rectosigmoid junction, and anus: Secondary | ICD-10-CM | POA: Diagnosis not present

## 2016-12-13 DIAGNOSIS — C20 Malignant neoplasm of rectum: Secondary | ICD-10-CM

## 2016-12-13 DIAGNOSIS — R5383 Other fatigue: Secondary | ICD-10-CM | POA: Diagnosis not present

## 2016-12-13 DIAGNOSIS — R110 Nausea: Secondary | ICD-10-CM | POA: Diagnosis not present

## 2016-12-13 MED ORDER — PROCHLORPERAZINE MALEATE 5 MG PO TABS: 5.0000 mg | ORAL_TABLET | Freq: Four times a day (QID) | ORAL | 0 refills | Status: DC | PRN

## 2016-12-13 NOTE — Progress Notes (Signed)
[place]  Telephone:([phone] Fax:(336) [phone]  Clinic follow Up Note   Patient Care Team: No Pcp Per Patient as PCP - General (General Practice) Truitt Merle, MD as Consulting Physician (Hematology) Kyung Rudd, MD as Consulting Physician (Radiation Oncology) Jackolyn Confer, MD as Consulting Physician (General Surgery) Richmond Campbell, MD as Consulting Physician (Gastroenterology) Milus Banister, MD as Attending Physician (Gastroenterology) Tania Ade, RN as Registered Nurse Leighton Ruff, MD as Consulting Physician (General Surgery) 12/13/2016   CHIEF COMPLAINTS:  Follow up rectal cancer  Oncology History   Rectal cancer [facility])   Staging form: Colon and Rectum, AJCC 7th Edition     Clinical stage from 12/03/2015: Stage IIIB (T3, N1, M0) - Signed by Truitt Merle, MD on 12/18/2015       Rectal cancer ([place])   12/03/2015 Initial Diagnosis    Rectal cancer ([place])      12/03/2015 Procedure    Colonoscopy showed a partially obstructing tumor in the proximal rectum, its diameter measured 6 mm, biopsed      12/03/2015 Initial Biopsy    Rectum mass biopsy showed adenocarcinoma, moderately differentiated      12/10/2015 Imaging    CT abdomen and pelvis with contrast showed luminal narrowing and mural thickening in the proximal to mid rectum. There is a 6 mm short axis mesorectal lymph node suspicious for metastasis. There are several tiny indeterminate liver lesions       12/17/2015 Imaging    MRI pelvis with and without contrast showed a advanced T3 rectal adenocarcinoma (7cm), N1 disease (at least 2 left-sided nasal rectal lymph nodes are seen measuring 6-7 mm), distance from tumor to the sphincter is 8 cm      12/17/2015 Imaging    CT chest with contrast showed no evidence of metastasis or other changes      12/30/2015 - 02/08/2016 Radiation Therapy    neoadjuvant radiation to rectal cancer       12/30/2015 - 02/08/2016 Chemotherapy    xeloda 1536m twice daily  with concurent radiation, she tolerated well       03/15/2016 Imaging    CT AP IMPRESSION: 1. No acute findings within the abdomen or pelvis. No evidence for metastatic disease. 2. Mild wall thickening involving the distal sigmoid colon and rectum is identified. Nonspecific and may reflect sequelae of external beam radiation.      03/21/2016 Surgery    Rectosigmoid segmental resection       03/21/2016 Pathology Results    Invasive well differentiated adenocarcinoma, 5cm,  margins are negative, LVI (-), 1 out of 27 nodes were negative.        05/05/2016 - 05/05/2016 Adjuvant Chemotherapy    Adjuvant CAPOX, she had facial numbness and SOB after she completed the first oxaliplatin infusion, and refused more oxaliplatin       12/12/2016 Imaging    CT AP IMPRESSION: 1. Status post resection of rectal carcinoma. No complications identified and no specific features identified to suggest residual/recurrence of tumor along the suture line. 2. Stable tiny low-attenuation foci within the liver. This is too small to characterize. 3. No specific findings identified to suggest metastatic disease.       HISTORY OF PRESENTING ILLNESS:  Lisvet AConrow51y.o. female is here because of her newly diagnosed rectal cancer. She is accompanied by her daughter and her mother to our multidisciplinary GI clinic today.  She has had chronic abdominal pain for 2-3 years, in the epigastric area and low abdomen,  which has been worse lately, and she noticed constipation, abdominal bloating and mild rectal bleeding for 2 months. She was referred to Dr. Earlean Shawl and underwent colonoscopy on 12/03/2015. A partial obstructive rectal mass was found in the proximal rectum, biopsy showed adenocarcinoma. She was referred to surgeon Dr. Zella Richer who referred her to [place] to consider new adjuvant chemoradiation.  She feels well overall, has mild fatigue, but able to tolerate her routine full-time job in daily activities without  difficulties. She has good appetite, but eats less due to the epigastric pain after meal. She denies any other significant pain, cough, or other symptoms. No recent weight loss. She has been on phentermine for weight loss recently, and stopped a few days ago.  CURRENT THERAPY:  Surveillance  INTERIM HISTORY:  Mrs Capobianco returns for follow up. She presented to the clinic today and reports she is active in the [employer] program, but since experiencing on and off nausea she has stopped going. Nausea has progressed along with yellow diarrhea with bloating, feet swelling, weight gain, chills and slight shaking. Her appetite is low because she is too nauseous to eat. She is snacking through the day. She used colace and miralax and have not noticed any difference. She also reports having a "sour taste" in her mouth. She reports that there was drainage and pus from her navel, but is clear and stopped now.   She also reports a smell of rotten eggs from her house that lead to her getting sick/nauseous. And denies any mold in her house that has also contributed to her headaches.  MEDICAL HISTORY:  Past Medical History:  Diagnosis Date  . Anemia    during pregnancy, not now.  . 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 Atypical syncope    1 episode -never had follow up with cardiology-never any further episodes, did go to urgent care.  . Colon cancer [facility])    rectal cancer Radiation and oral chemo with radiation.  . Complication of anesthesia   . Family history of breast cancer   . Family history of uterine cancer   . Genital lesion, female    at times  . Heart burn    occ.  Marland Kitchen Hx of seasonal allergies   . Motion sickness   . PONV (postoperative nausea and vomiting)   . Rectal cancer ([place])   . Umbilicus discharge     SURGICAL HISTORY: Past Surgical History:  Procedure Laterality Date  . 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  CESAREAN SECTION    . COLON SURGERY    . DIAGNOSTIC LAPAROSCOPY     with D and C and Ovarian cystectomy, tubal ligation done at this time '00  . DILATION AND CURETTAGE OF UTERUS    . TUBAL LIGATION    . XI ROBOTIC ASSISTED LOWER ANTERIOR RESECTION N/A 03/21/2016   Procedure: XI ROBOTIC ASSISTED LOWER ANTERIOR RESECTION;  Surgeon: Leighton Ruff, MD;  Location: [facility];  Service: General;  Laterality: N/A;    SOCIAL HISTORY: Social History   Social History  . Marital status: Legally Separated    Spouse name: N/A  . Number of children: 3  . Years of education: N/A   Occupational History  . Not on file.   Social History Main Topics  . Smoking status: Never Smoker  . Smokeless tobacco: Never Used  . Alcohol use No  . Drug use: No  . Sexual activity: Not  on file   Other Topics Concern  . Not on file   Social History Narrative  . No narrative on file   She has 3 children, she lives with her twin sons who are 48. She works for [employer]   FAMILY HISTORY: Family History  Problem Relation Age of Onset  . Breast cancer Maternal Grandmother     dx in her 41s  . Cancer Paternal Grandmother 3    uterine cancer   . Hypertension Father   . COPD Father   . Hypertension Sister   . Hypertension Paternal Grandfather   . Heart attack Paternal Grandfather   . Hypertension Sister   . Skin cancer Cousin     maternal first cousin    ALLERGIES:  is allergic to codeine; morphine and related; sucralose; and zolpidem tartrate.  MEDICATIONS:  Current Outpatient Prescriptions  Medication Sig Dispense Refill  . diphenhydrAMINE (BENADRYL) 25 MG tablet Take 25 mg by mouth every 6 (six) hours as needed.    . fluticasone (FLONASE) 50 MCG/ACT nasal spray Place into both nostrils daily.    . Ginger, Zingiber officinalis, (GINGER ROOT PO) Take 1 capsule by mouth as needed.     Marland Kitchen ibuprofen (ADVIL,MOTRIN) 100 MG tablet Take 100 mg by mouth every 6 (six) hours as needed for fever.    .  naproxen sodium (ANAPROX) 220 MG tablet Take 220 mg by mouth 2 (two) times daily with a meal.    . omeprazole (PRILOSEC) 20 MG capsule Take 1 capsule (20 mg total) by mouth daily. (Patient not taking: Reported on 12/13/2016) 30 capsule 3  . ranitidine (ZANTAC) 150 MG tablet Take 1 tablet (150 mg total) by mouth 2 (two) times daily. (Patient not taking: Reported on 12/13/2016) 60 tablet 1  . valACYclovir (VALTREX) 500 MG tablet take 1 tablet by mouth as needed when has outbreak   for BID x 3 days.  4   No current facility-administered medications for this visit.     REVIEW OF SYSTEMS:   Constitutional: Denies fevers, abnormal night sweats (+) chills (+) lowered appetite  Eyes: Denies blurriness of vision, double vision or watery eyes Ears, nose, mouth, throat, and face: Denies mucositis or sore throat Respiratory: Denies cough, dyspnea or wheezes Cardiovascular: Denies palpitation, chest discomfort or lower extremity swelling Gastrointestinal:  heartburn or change in bowel habits (+) bloating (+) recurring nausea (+) yellow diarrhea (+) "sour taste" in mouth  Skin: Denies abnormal skin rashes Lymphatics: Denies new lymphadenopathy or easy bruising Neurological:Denies numbness, tingling or new weaknesses Musculoskeletal: (+) L shoulder pain Behavioral/Psych: Mood is stable, no new changes  All other systems were reviewed with the patient and are negative.  PHYSICAL EXAMINATION: ECOG PERFORMANCE STATUS: 1 - Symptomatic but completely ambulatory  Vitals:   12/13/16 1353  BP: 109/77  Pulse: 65  Resp: 18  Temp: 97.9 F (36.6 C)   Filed Weights   12/13/16 1353  Weight: 190 lb 12.8 oz (86.5 kg)   GENERAL:alert, no distress and comfortable SKIN: skin color, texture, turgor are normal, no rashes or significant lesions EYES: normal, conjunctiva are pink and non-injected, sclera clear OROPHARYNX:no exudate, no erythema and lips, buccal mucosa, and tongue normal  NECK: supple, thyroid normal  size, non-tender, without nodularity LYMPH:  no palpable lymphadenopathy in the cervical, axillary or inguinal LUNGS: clear to auscultation and percussion with normal breathing effort HEART: regular rate & rhythm and no murmurs and no lower extremity edema ABDOMEN: abdomen soft, non-tender and normal bowel  sounds. Incision site well healed. (+) no navel discharge or erythema. Only scar from previous surgery well healed.  Musculoskeletal:no cyanosis of digits and no clubbing  PSYCH: alert & oriented x 3 with fluent speech NEURO: no focal motor/sensory deficits  LABORATORY DATA:  I have reviewed the data as listed CBC Latest Ref Rng & Units 12/01/2016 09/13/2016 06/13/2016  WBC 3.4 - 10.8 x10E3/uL 4.3 3.9 2.3(L)  Hemoglobin 11.6 - 15.9 g/dL - 12.5 12.4  Hematocrit 34.0 - 46.6 % 37.4 37.1 37.9  Platelets 150 - 379 x10E3/uL 225 252 226    CMP Latest Ref Rng & Units 12/01/2016 09/13/2016 06/13/2016  Glucose 65 - 99 mg/dL 103(H) 86 76  BUN 6 - 24 mg/dL 18 9.5 9.5  Creatinine 0.57 - 1.00 mg/dL 0.76 0.8 0.8  Sodium 134 - 144 mmol/L 141 143 143  Potassium 3.5 - 5.2 mmol/L 3.8 3.7 4.0  Chloride 96 - 106 mmol/L 100 - -  CO2 18 - 29 mmol/L '27 28 26  ' Calcium 8.7 - 10.2 mg/dL 9.2 9.8 9.7  Total Protein 6.0 - 8.5 g/dL 7.1 7.7 7.5  Total Bilirubin 0.0 - 1.2 mg/dL 0.6 0.79 0.80  Alkaline Phos 39 - 117 IU/L 108 95 66  AST 0 - 40 IU/L '17 23 23  ' ALT 0 - 32 IU/L '11 16 12   ' CEA 12/18/2015: 0.9 04/27/2016: 0.6 09/13/2016: 0.8   PATHOLOGY REPORT Diagnosis 12/03/2015 Colon, sigmoid, mass, biopsy -Invasive adenocarcinoma, moderately differentiated. See comment.  Comment: due to the presence of adenocarcinoma, IHC for DNA mismatch repair proteins will be performed and reported in an addendum.  Diagnosis 03/21/2016 1. Colon, segmental resection for tumor, rectosigmoid - INVASIVE WELL DIFFERENTIATED ADENOCARCINOMA, SPANNING 5 CM IN GREATEST DIMENSION. - TUMOR INVADES THROUGH MUSCULARIS PROPRIA TO INVOLVE  SUBSEROSAL SOFT TISSUES. - MARGINS ARE NEGATIVE. - ONE OF TWENTY-SEVEN LYMPH NODES IS POSITIVE FOR METASTATIC ADENOCARCINOMA (1/27). - SEE ONCOLOGY TEMPLATE. 2. Colon, resection margin (donut), distal rectal, final margin - BENIGN COLORECTAL MUCOSA. - NO TUMOR SEEN. Microscopic Comment 1. COLON AND RECTUM (INCLUDING TRANS-ANAL RESECTION): Specimen: Rectosigmoid colon. Procedure: Robotic lower anterior resection. Tumor site: Rectum. Specimen integrity: Intact. Macroscopic intactness of mesorectum: Complete. Macroscopic tumor perforation: Not identified. Invasive tumor: Maximum size: 5 cm. Histologic type(s): Adenocarcinoma. Histologic grade and differentiation: G1: well differentiated/low grade. Type of polyp in which invasive carcinoma arose: Tubular adenoma with high grade dysplasia. Microscopic extension of invasive tumor: Tumor invades through muscularis propria to involve subserosal soft tissues. Lymph-Vascular invasion: Although definitive lymph/vascular invasion is not identified, there is lymph node positivity (see below). Peri-neural invasion: None identified. Tumor deposit(s) (discontinuous extramural extension): None identified. Resection margins: Proximal margin: Negative. Distal margin: Negative. Circumferential (radial) (posterior ascending, posterior descending; lateral and posterior mid-rectum; and entire lower 1/3 rectum): Negative. Mesenteric margin: Not applicable. Distance closest margin (if all above margins negative): .2.4 cm (distal margin) Trans-anal resection margins only: Not applicable. Deep margin: Not applicable. Mucosal Margin: Not applicable. Distance closest mucosal margin (if negative): Not applicable. Treatment effect (neo-adjuvant therapy): Minimal treatment effect is present in the primary tumor. Minimal treatment effect is also present in the positive lymph nodes with the other lymph nodes showing no degree of treatment effect. Additional  polyp(s): No additional polyps identified. Non-neoplastic findings: Diverticulosis. Lymph nodes: number examined 27; number positive: 1. Pathologic Staging: ypT3, ypN1a. Ancillary studies: As the patient is status-post neoadjuvant therapy, MSI by PCR and MMR by [facility] will not be sent.   RADIOGRAPHIC STUDIES: I have personally reviewed the  radiological images as listed and agreed with the findings in the report.  CT AP 12/12/16 IMPRESSION: 1. Status post resection of rectal carcinoma. No complications identified and no specific features identified to suggest residual/recurrence of tumor along the suture line. 2. Stable tiny low-attenuation foci within the liver. This is too small to characterize. 3. No specific findings identified to suggest metastatic disease.  COLONOSCOPY DR. MEDOFF 12/03/2015 Impression Malignant partially obstructing tumor in the proximal rectum, its diameter measured 6 mm, biopsy. Distal margin at 15 cm.    ASSESSMENT & PLAN:  51 y.o. Caucasian female, without significant past medical history, presented with abdominal pain and rectal bleeding. Colonoscopy showed a obstructing tumor in the proximal rectum.  1. Rectal adenocarcinoma, proximal rectum, cT3N1M0, ypT3N1aM0, stage IIIB, moderately differentiated --I previously reviewed her surgical path findings with pt in details -she has significant residual disease after neoadjuvant chemo and radiaiton -I previously discussed the high risk of cancer recurrence after surgery, I recommend adjuvant chemo -we previously discussed the option of FOLFOX and CAPOX. She opted CAPOX  -She has some neurological symptoms after first oxaliplatin infusion, and refuses to try again with premedication. She states she is not going to regret even if she had cancer recurrence. Chemo was held.  -I encouraged her to consider single agent Xeloda, which she took with radiation before. She declined. ---Lab and surveillance CT scan from 12/12/2016  results reviewed with her, which showed no evidence of recurrence. -She is clinically doing well overall, but does report mild intermittent nausea and bloating, possibly related to constipation, exam is unremarkable. I encouraged patient to follow-up with Dr. Earlean Shawl -Due to nausea she has slowed down in activity -Patient will schedule colonoscopy with Dr. Earlean Shawl -Continue cancer surveillance, I'll see her every 3-4 months, plan to repeat surveillance CT scan in 6 months   2. Fatigue  -Possibly related to her surgery and chemotherapy, she is recovering slowly. -I have strongly encouraged her to gradually increase her activity level, and exercise regularly, to get her strength back.  3. Genetics -she was seen by genetic counselor, and her genetic test was negative for lynch syndrome or the following 32 genes: APC, ATM, AXIN2, BARD1, BMPR1A, BRCA1, BRCA2, BRIP1, CDH1, CDK4, CDKN2A, CHEK2, EPCAM, FANCC, MLH1, MSH2, MSH6, MUTYH, NBN, PALB2, PMS2, POLD1, POLE, PTEN, RAD51C, RAD51D, SCG5/GREM1, SMAD4, STK11, TP53, VHL, and XRCC2.    4. Nausea -Patient complains of nausea that is on an off.  -I prescribed her compazine to help -Pt is also looking into housing ventilation smell that may be contributing to her sickness.  -I strongly encouraged her to follow-up with Dr. Earlean Shawl for work up    Plan -Order compazine today -She will call Dr. Earlean Shawl for appointment and colonoscopy ASAP  -Lab and F/u in 4 months     All questions were answered. The patient knows to call the clinic with any problems, questions or concerns.  I spent 20 minutes counseling the patient face to face. The total time spent in the appointment was 25 minutes and more than 50% was on counseling.  This document serves as a record of services personally performed by Truitt Merle, MD. It was created on her behalf by Joslyn Devon, a trained medical scribe. The creation of this record is based on the scribe's personal observations and the  provider's statements to them. This document has been checked and approved by the attending provider.   I have reviewed the above documentation for accuracy and completeness and I agree with  the above.   Truitt Merle, MD 12/13/2016

## 2016-12-14 ENCOUNTER — Encounter: Payer: Self-pay | Admitting: Hematology

## 2016-12-16 ENCOUNTER — Ambulatory Visit: Payer: 59 | Admitting: Hematology

## 2016-12-16 ENCOUNTER — Telehealth: Payer: Self-pay | Admitting: Hematology

## 2016-12-16 NOTE — Telephone Encounter (Signed)
Patient bypassed scheduling area on 12/13/16.  Appointments scheduled per 12/13/16 los. Patient was mailed a copy of the AVS report and appointment schedule per 12/13/16 los.

## 2016-12-19 ENCOUNTER — Other Ambulatory Visit: Payer: 59

## 2016-12-20 ENCOUNTER — Ambulatory Visit: Payer: 59 | Admitting: Hematology

## 2016-12-30 ENCOUNTER — Telehealth: Payer: Self-pay | Admitting: *Deleted

## 2016-12-30 NOTE — Telephone Encounter (Signed)
Pt called wanting to inform Dr. Burr Medico re:  Pt has colonoscopy appt with Dr. Lana Fish at end of June.  Pt has been referred to Dr. Joyice Faster for surgical consult of naval infection.  Pt also stated she would need to extend her short term disability due to her multiple medical issues.  Stated her HR will fax paper work to this clinic.  Will forward forms to Bryson Corona, [facility] specialist when forms received. Pt's    Phone      [phone].

## 2017-01-18 ENCOUNTER — Ambulatory Visit: Payer: 59 | Admitting: Physician Assistant

## 2017-01-20 ENCOUNTER — Encounter: Payer: Self-pay | Admitting: Physician Assistant

## 2017-01-20 ENCOUNTER — Ambulatory Visit (INDEPENDENT_AMBULATORY_CARE_PROVIDER_SITE_OTHER): Payer: 59 | Admitting: Physician Assistant

## 2017-01-20 VITALS — BP 113/75 | HR 80 | Temp 97.3°F | Resp 18 | Ht 63.0 in | Wt 196.0 lb

## 2017-01-20 DIAGNOSIS — R198 Other specified symptoms and signs involving the digestive system and abdomen: Secondary | ICD-10-CM

## 2017-01-20 NOTE — Patient Instructions (Signed)
     IF you received an x-ray today, you will receive an invoice from [facility]. Please contact [facility] at [phone] with questions or concerns regarding your invoice.   IF you received labwork today, you will receive an invoice from [facility]. Please contact [facility] at [phone] with questions or concerns regarding your invoice.   Our billing staff will not be able to assist you with questions regarding bills from these companies.  You will be contacted with the lab results as soon as they are available. The fastest way to get your results is to activate your My Chart account. Instructions are located on the last page of this paperwork. If you have not heard from us regarding the results in 2 weeks, please contact this office.     

## 2017-01-21 NOTE — Progress Notes (Signed)
PRIMARY CARE AT [facility] [address] [place] [address] [phone]  Date:  01/20/2017   Name:  Kaitlyn Morgan   DOB:  Aug 21, 1965   MRN:  [id number]  PCP:  Patient, No Pcp Per    History of Present Illness:  Kaitlyn Morgan is a 51 y.o. female patient who presents to PCP with  Chief Complaint  Patient presents with  . Naval drainage  . Bloated  . Follow-up     Patient is here today with concern of bloating and continued naval drainage.  More than 1 month ago, she was seen with naval drainage.  Concern of open cavity of the umbilicus, she was referred go general surgery for consult.  She returns today with continued malodorous drainage of a bloody/clear fluid.  She notes that she feels slightly bloated, and pressure as if something is trying to open up.    Patient Active Problem List   Diagnosis Date Noted  . Diarrhea 05/11/2016  . Dehydration 05/11/2016  . Nausea with vomiting 05/11/2016  . Anxiety 05/11/2016  . Genetic testing 02/19/2016  . Family history of breast cancer   . Family history of uterine cancer   . Rectal cancer ([place]) 12/18/2015    Past Medical History:  Diagnosis Date  . Anemia    during pregnancy, not now.  . 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 Atypical syncope    1 episode -never had follow up with cardiology-never any further episodes, did go to urgent care.  . Colon cancer [facility])    rectal cancer Radiation and oral chemo with radiation.  . Complication of anesthesia   . Family history of breast cancer   . Family history of uterine cancer   . Genital lesion, female    at times  . Heart burn    occ.  Marland Kitchen Hx of seasonal allergies   . Motion sickness   . PONV (postoperative nausea and vomiting)   . Rectal cancer ([place])   . Umbilicus discharge     Past Surgical History:  Procedure Laterality Date  . 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 CESAREAN SECTION    . COLON SURGERY     . DIAGNOSTIC LAPAROSCOPY     with D and C and Ovarian cystectomy, tubal ligation done at this time '00  . DILATION AND CURETTAGE OF UTERUS    . TUBAL LIGATION    . XI ROBOTIC ASSISTED LOWER ANTERIOR RESECTION N/A 03/21/2016   Procedure: XI ROBOTIC ASSISTED LOWER ANTERIOR RESECTION;  Surgeon: Leighton Ruff, MD;  Location: [facility];  Service: General;  Laterality: N/A;    Social History  Substance Use Topics  . Smoking status: Never Smoker  . Smokeless tobacco: Never Used  . Alcohol use No    Family History  Problem Relation Age of Onset  . Breast cancer Maternal Grandmother        dx in her 64s  . Cancer Paternal Grandmother 53       uterine cancer   . Hypertension Father   . COPD Father   . Hypertension Sister   . Hypertension Paternal Grandfather   . Heart attack Paternal Grandfather   . Hypertension Sister   . Skin cancer Cousin        maternal first cousin    Allergies  Allergen Reactions  . Codeine Nausea Only  . Morphine And Related Itching  . Sucralose Nausea And Vomiting  Fever  . Zolpidem Tartrate     Hallucinations     Medication list has been reviewed and updated.  No current outpatient prescriptions on file prior to visit.   No current facility-administered medications on file prior to visit.     ROS ROS otherwise unremarkable unless listed above.  Physical Examination: BP 113/75   Pulse 80   Temp 97.3 F (36.3 C) (Oral)   Resp 18   Ht 5\' 3"  (1.6 m)   Wt 196 lb (88.9 kg)   SpO2 99%   BMI 34.72 kg/m  Ideal Body Weight: Weight in (lb) to have BMI = 25: 140.8  Physical Exam  Constitutional: She is oriented to person, place, and time. She appears well-developed and well-nourished. No distress.  HENT:  Head: Normocephalic and atraumatic.  Right Ear: External ear normal.  Left Ear: External ear normal.  Eyes: Conjunctivae and EOM are normal. Pupils are equal, round, and reactive to light.  Cardiovascular: Normal rate.   Pulmonary/Chest:  Effort normal. No respiratory distress.  Abdominal:  The 12 oclock position of the umbilicus, there is a depth 2cm into the abdomen.  Drainage is sanguinous and has a slightly oily quality.  Suprapubic tenderness.  Neurological: She is alert and oriented to person, place, and time.  Skin: She is not diaphoretic.  Psychiatric: She has a normal mood and affect. Her behavior is normal.     Assessment and Plan: Kaitlyn Morgan is a 51 y.o. female who is hx for cc of naval drainage. --there is concern for abdominal wall opening or possible mass. --I will consult with general surgery to see what the best avenue for consult would be. Umbilicus discharge  Ivar Drape, PA-C [facility] 6/10/20187:56 AM

## 2017-03-29 DIAGNOSIS — Z860101 Personal history of adenomatous and serrated colon polyps: Secondary | ICD-10-CM | POA: Insufficient documentation

## 2017-04-12 ENCOUNTER — Other Ambulatory Visit: Payer: Self-pay | Admitting: Gastroenterology

## 2017-04-12 DIAGNOSIS — R1110 Vomiting, unspecified: Principal | ICD-10-CM

## 2017-04-12 DIAGNOSIS — R197 Diarrhea, unspecified: Principal | ICD-10-CM

## 2017-04-12 DIAGNOSIS — R5381 Other malaise: Secondary | ICD-10-CM

## 2017-04-12 DIAGNOSIS — R109 Unspecified abdominal pain: Secondary | ICD-10-CM

## 2017-04-13 ENCOUNTER — Encounter: Payer: Self-pay | Admitting: Physician Assistant

## 2017-04-13 ENCOUNTER — Ambulatory Visit (INDEPENDENT_AMBULATORY_CARE_PROVIDER_SITE_OTHER): Payer: 59 | Admitting: Physician Assistant

## 2017-04-13 ENCOUNTER — Ambulatory Visit (HOSPITAL_BASED_OUTPATIENT_CLINIC_OR_DEPARTMENT_OTHER)
Admission: RE | Admit: 2017-04-13 | Discharge: 2017-04-13 | Disposition: A | Discharge status: Home / Self Care | Payer: 59 | Source: Ambulatory Visit | Attending: Physician Assistant | Admitting: Physician Assistant

## 2017-04-13 VITALS — BP 121/80 | HR 79 | Temp 98.2°F | Resp 16 | Ht 63.0 in | Wt 199.0 lb

## 2017-04-13 DIAGNOSIS — R300 Dysuria: Secondary | ICD-10-CM

## 2017-04-13 DIAGNOSIS — M79604 Pain in right leg: Secondary | ICD-10-CM | POA: Insufficient documentation

## 2017-04-13 DIAGNOSIS — M7989 Other specified soft tissue disorders: Secondary | ICD-10-CM | POA: Insufficient documentation

## 2017-04-13 DIAGNOSIS — M79661 Pain in right lower leg: Secondary | ICD-10-CM | POA: Insufficient documentation

## 2017-04-13 LAB — POCT CBC
Granulocyte percent: 69.5 %G (ref 37–80)
HCT, POC: 37.8 % (ref 37.7–47.9)
Hemoglobin: 13.2 g/dL (ref 12.2–16.2)
Lymph, poc: 1.2 (ref 0.6–3.4)
MCH, POC: 30.6 pg (ref 27–31.2)
MCHC: 35 g/dL (ref 31.8–35.4)
MCV: 87.4 fL (ref 80–97)
MID (cbc): 0.3 (ref 0–0.9)
MPV: 6.3 fL (ref 0–99.8)
POC Granulocyte: 3.4 (ref 2–6.9)
POC LYMPH PERCENT: 23.5 %L (ref 10–50)
POC MID %: 7 %M (ref 0–12)
Platelet Count, POC: 295 10*3/uL (ref 142–424)
RBC: 4.32 M/uL (ref 4.04–5.48)
RDW, POC: 15.2 %
WBC: 4.9 10*3/uL (ref 4.6–10.2)

## 2017-04-13 LAB — POCT URINALYSIS DIP (MANUAL ENTRY)
Bilirubin, UA: NEGATIVE
Glucose, UA: NEGATIVE mg/dL
Ketones, POC UA: NEGATIVE mg/dL
Leukocytes, UA: NEGATIVE
Nitrite, UA: NEGATIVE
Protein Ur, POC: NEGATIVE mg/dL
Spec Grav, UA: 1.025 (ref 1.010–1.025)
Urobilinogen, UA: 1 E.U./dL
pH, UA: 5.5 (ref 5.0–8.0)

## 2017-04-13 NOTE — Progress Notes (Signed)
PRIMARY CARE AT [facility] [address] [place] [address] [phone]  Date:  04/13/2017   Name:  Kaitlyn Morgan   DOB:  04/09/1966   MRN:  [id number]  PCP:  Joretta Bachelor, PA    History of Present Illness:  Kaitlyn Morgan is a 51 y.o. female patient with PMH of rectal cancer who presents to PCP with  Chief Complaint  Patient presents with  . Leg Swelling    left side x 2 days.     Left leg swollen for 2 days and feels tight.  She reports that the pain is along the front of her leg and calf.  Pain with movement.  She states that she has noticed some trouble breathing, like she is "wheezing".  She reports a gas leak in her home that has been evaluated, as well as water leaking under her kitchen cabinet.  She has no coughing.  She reports that 1 week ago she had flu like symptoms, body aches, and a hsv outbreak.  This had resolved.  She took anti-viral during this stent.  She works from home, and sedentary for hours.   Reports dysuria 2 weeks ago, that has now resolved.  No hematuria, abdominal pain, or frequency. She complains of tingling along her right side of mouth and ears.  No extremity weakness.   No trauma  Patient Active Problem List   Diagnosis Date Noted  . Diarrhea 05/11/2016  . Dehydration 05/11/2016  . Nausea with vomiting 05/11/2016  . Anxiety 05/11/2016  . Genetic testing 02/19/2016  . Family history of breast cancer   . Family history of uterine cancer   . Rectal cancer ([place]) 12/18/2015    Past Medical History:  Diagnosis Date  . Anemia    during pregnancy, not now.  . 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 Atypical syncope    1 episode -never had follow up with cardiology-never any further episodes, did go to urgent care.  . Colon cancer [facility])    rectal cancer Radiation and oral chemo with radiation.  . Complication of anesthesia   . Family history of breast cancer   . Family history of uterine cancer   .  Genital lesion, female    at times  . Heart burn    occ.  Marland Kitchen Hx of seasonal allergies   . Motion sickness   . PONV (postoperative nausea and vomiting)   . Rectal cancer ([place])   . Umbilicus discharge     Past Surgical History:  Procedure Laterality Date  . 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 CESAREAN SECTION    . COLON SURGERY    . DIAGNOSTIC LAPAROSCOPY     with D and C and Ovarian cystectomy, tubal ligation done at this time '00  . DILATION AND CURETTAGE OF UTERUS    . TUBAL LIGATION    . XI ROBOTIC ASSISTED LOWER ANTERIOR RESECTION N/A 03/21/2016   Procedure: XI ROBOTIC ASSISTED LOWER ANTERIOR RESECTION;  Surgeon: Leighton Ruff, MD;  Location: [facility];  Service: General;  Laterality: N/A;    Social History  Substance Use Topics  . Smoking status: Never Smoker  . Smokeless tobacco: Never Used  . Alcohol use No    Family History  Problem Relation Age of Onset  . Breast cancer Maternal Grandmother        dx in her 40s  . Cancer Paternal Grandmother 36  uterine cancer   . Hypertension Father   . COPD Father   . Hypertension Sister   . Hypertension Paternal Grandfather   . Heart attack Paternal Grandfather   . Hypertension Sister   . Skin cancer Cousin        maternal first cousin    Allergies  Allergen Reactions  . Codeine Nausea Only  . Morphine And Related Itching  . Sucralose Nausea And Vomiting    Fever  . Zolpidem Tartrate     Hallucinations     Medication list has been reviewed and updated.  Current Outpatient Prescriptions on File Prior to Visit  Medication Sig Dispense Refill  . polyethylene glycol (MIRALAX / GLYCOLAX) packet Take 17 g by mouth daily as needed for moderate constipation.     No current facility-administered medications on file prior to visit.     ROS ROS otherwise unremarkable unless listed above.  Physical Examination: BP 121/80   Pulse 79   Temp 98.2 F (36.8 C) (Oral)   Resp 16   Ht 5'  3" (1.6 m)   Wt 199 lb (90.3 kg)   SpO2 99%   BMI 35.25 kg/m  Ideal Body Weight: Weight in (lb) to have BMI = 25: 140.8  Physical Exam  Constitutional: She is oriented to person, place, and time. She appears well-developed and well-nourished. No distress.  HENT:  Head: Normocephalic and atraumatic.  Right Ear: External ear normal.  Left Ear: External ear normal.  Eyes: Pupils are equal, round, and reactive to light. Conjunctivae and EOM are normal.  Cardiovascular: Normal rate.   Pulmonary/Chest: Effort normal. No respiratory distress.  Musculoskeletal:  Right lower extremity with lateral mild erythema and warmth.  Appearing firm and very mild tenderness reported with deep palpation just lateral to calf.    Neurological: She is alert and oriented to person, place, and time. She displays a negative Romberg sign. Coordination and gait normal.  No extremity weakness.  Skin: She is not diaphoretic.  Psychiatric: She has a normal mood and affect. Her behavior is normal.     Assessment and Plan: Kaitlyn Morgan is a 51 y.o. female who is here today for right leg swelling dvt possibility vs cellulitis. Dysuria - Plan: POCT urinalysis dipstick  Right leg pain - Plan: POCT CBC, POCT urinalysis dipstick, US Venous Img Lower Unilateral Right, CANCELED: VAS [place] LOWER EXTREMITY VENOUS (DVT)  Pain and swelling of right lower leg - Plan: US Venous Img Lower Unilateral Right, CANCELED: VAS [place] LOWER EXTREMITY VENOUS (DVT)  Ivar Drape, PA-C [facility] 8:45, 04/14/2017

## 2017-04-13 NOTE — Progress Notes (Addendum)
[place]  Telephone:([phone] Fax:([phone]) [phone]  Clinic follow Up Note   Patient Care Team: Joretta Bachelor, [place] as PCP - General (Physician Assistant) Truitt Merle, MD as Consulting Physician (Hematology) Kyung Rudd, MD as Consulting Physician (Radiation Oncology) Jackolyn Confer, MD as Consulting Physician (General Surgery) Richmond Campbell, MD as Consulting Physician (Gastroenterology) Milus Banister, MD as Attending Physician (Gastroenterology) Tania Ade, RN as Registered Nurse Leighton Ruff, MD as Consulting Physician (General Surgery) 04/19/2017  CHIEF COMPLAINTS:  Follow up rectal cancer  Oncology History   Rectal cancer [facility])   Staging form: Colon and Rectum, AJCC 7th Edition     Clinical stage from 12/03/2015: Stage IIIB (T3, N1, M0) - Signed by Truitt Merle, MD on 12/18/2015       Rectal cancer ([place])   12/03/2015 Initial Diagnosis    Rectal cancer ([place])      12/03/2015 Procedure    Colonoscopy showed a partially obstructing tumor in the proximal rectum, its diameter measured 6 mm, biopsed      12/03/2015 Initial Biopsy    Rectum mass biopsy showed adenocarcinoma, moderately differentiated      12/10/2015 Imaging    CT abdomen and pelvis with contrast showed luminal narrowing and mural thickening in the proximal to mid rectum. There is a 6 mm short axis mesorectal lymph node suspicious for metastasis. There are several tiny indeterminate liver lesions       12/17/2015 Imaging    MRI pelvis with and without contrast showed a advanced T3 rectal adenocarcinoma (7cm), N1 disease (at least 2 left-sided nasal rectal lymph nodes are seen measuring 6-7 mm), distance from tumor to the sphincter is 8 cm      12/17/2015 Imaging    CT chest with contrast showed no evidence of metastasis or other changes      12/30/2015 - 02/08/2016 Radiation Therapy    neoadjuvant radiation to rectal cancer       12/30/2015 - 02/08/2016 Chemotherapy    xeloda  1580m twice daily with concurent radiation, she tolerated well       03/15/2016 Imaging    CT AP IMPRESSION: 1. No acute findings within the abdomen or pelvis. No evidence for metastatic disease. 2. Mild wall thickening involving the distal sigmoid colon and rectum is identified. Nonspecific and may reflect sequelae of external beam radiation.      03/21/2016 Surgery    Rectosigmoid segmental resection       03/21/2016 Pathology Results    Invasive well differentiated adenocarcinoma, 5cm,  margins are negative, LVI (-), 1 out of 27 nodes were negative.        05/05/2016 - 05/05/2016 Adjuvant Chemotherapy    Adjuvant CAPOX, she had facial numbness and SOB after she completed the first oxaliplatin infusion, and refused more oxaliplatin       12/12/2016 Imaging    CT AP IMPRESSION: 1. Status post resection of rectal carcinoma. No complications identified and no specific features identified to suggest residual/recurrence of tumor along the suture line. 2. Stable tiny low-attenuation foci within the liver. This is too small to characterize. 3. No specific findings identified to suggest metastatic disease.       HISTORY OF PRESENTING ILLNESS:  Kaitlyn AHoffart51y.o. female is here because of her newly diagnosed rectal cancer. She is accompanied by her daughter and her mother to our multidisciplinary GI clinic today.  She has had chronic abdominal pain for 2-3 years, in the epigastric area and low abdomen, which  has been worse lately, and she noticed constipation, abdominal bloating and mild rectal bleeding for 2 months. She was referred to Dr. Earlean Shawl and underwent colonoscopy on 12/03/2015. A partial obstructive rectal mass was found in the proximal rectum, biopsy showed adenocarcinoma. She was referred to surgeon Dr. Zella Richer who referred her to [place] to consider new adjuvant chemoradiation.  She feels well overall, has mild fatigue, but able to tolerate her routine full-time job in daily  activities without difficulties. She has good appetite, but eats less due to the epigastric pain after meal. She denies any other significant pain, cough, or other symptoms. No recent weight loss. She has been on phentermine for weight loss recently, and stopped a few days ago.  CURRENT THERAPY:  Surveillance  INTERIM HISTORY:  Mrs. Koleen Nimrod returns today for follow-up as scheduled. She is discouraged because she does not feel well, reporting she feels worse than before she was diagnosed. Last week she noticed right foot swelling which traveled up her leg, associated with leg numbness and tingling and right face and lip numbness. She was worried she was having a stroke, so she went to [facility] ED. A right extremity venous Doppler ultrasound ruled out DVT; calf veins were poorly visualized. She was treated for cellulitis with Keflex, she continues that today. These symptoms have resolved. She notes some vaginal irritation and burning with urination since starting antibiotic. 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 Additionally she is having headaches that began a few months ago, usually in her face, eyes, and back of her head.  Her weight is up 3 pounds in 3 months despite decreased food intake. She is not exercising as much due to bloating. She has bowel urgency and stool leakage with episodes of incontinence. She has noticed dark stools over the last 1 month but denies bright red blood. She is intermittently constipated and uses MiraLAX and fiber. Her bowel pattern is irregular at baseline. She has occasional right-sided abdominal pain and nausea. She was prescribed Compazine at her last visit but "forgot about it." She had a gas leak in her home over the summer and feels some nausea may be related to this but is also related to constipation. She continues regular follow-up with Dr. Earlean Shawl. She had a colonoscopy last month, a polyp was removed. I have  requested that report. She has an MRI of the abdomen with and without contrast scheduled on 04/25/2017 per Dr. Earlean Shawl.   She notes discharge from her navel, but denies fever; this is ongoing, and she cleans at home with alcohol. Ms. Gift reports that she is depressed due to not feeling well and family-related stress related to her mother's illness and her responsibilities for her healthcare.   MEDICAL HISTORY:  Past Medical History:  Diagnosis Date  . Anemia    during pregnancy, not now.  . 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 Atypical syncope    1 episode -never had follow up with cardiology-never any further episodes, did go to urgent care.  . Colon cancer [facility])    rectal cancer Radiation and oral chemo with radiation.  . Complication of anesthesia   . Family history of breast cancer   . Family history of uterine cancer   . Genital lesion, female    at times  . Heart burn    occ.  Marland Kitchen Hx of seasonal allergies   . Motion sickness   . PONV (postoperative  nausea and vomiting)   . Rectal cancer ([place])   . Umbilicus discharge    SURGICAL HISTORY: Past Surgical History:  Procedure Laterality Date  . 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 CESAREAN SECTION    . COLON SURGERY    . DIAGNOSTIC LAPAROSCOPY     with D and C and Ovarian cystectomy, tubal ligation done at this time '00  . DILATION AND CURETTAGE OF UTERUS    . TUBAL LIGATION    . XI ROBOTIC ASSISTED LOWER ANTERIOR RESECTION N/A 03/21/2016   Procedure: XI ROBOTIC ASSISTED LOWER ANTERIOR RESECTION;  Surgeon: Leighton Ruff, MD;  Location: [facility];  Service: General;  Laterality: N/A;   SOCIAL HISTORY: Social History   Social History  . Marital status: Legally Separated    Spouse name: N/A  . Number of children: 3  . Years of education: N/A   Occupational History  . Not on file.   Social History Main Topics  . Smoking status: Never Smoker  . Smokeless  tobacco: Never Used  . Alcohol use No  . Drug use: No  . Sexual activity: Not on file   Other Topics Concern  . Not on file   Social History Narrative  . No narrative on file   She has 3 children, she lives with her twin sons who are 20. She works for [employer]   FAMILY HISTORY: Family History  Problem Relation Age of Onset  . Breast cancer Maternal Grandmother        dx in her 31s  . Cancer Paternal Grandmother 71       uterine cancer   . Hypertension Father   . COPD Father   . Hypertension Sister   . Hypertension Paternal Grandfather   . Heart attack Paternal Grandfather   . Hypertension Sister   . Skin cancer Cousin        maternal first cousin   ALLERGIES:  is allergic to codeine; morphine and related; sucralose; and zolpidem tartrate.  MEDICATIONS:  Current Outpatient Prescriptions  Medication Sig Dispense Refill  . cephALEXin (KEFLEX) 500 MG capsule Take 1 capsule (500 mg total) by mouth 4 (four) times daily. 28 capsule 0  . polyethylene glycol (MIRALAX / GLYCOLAX) packet Take 17 g by mouth daily as needed for moderate constipation.    . valACYclovir (VALTREX) 500 MG tablet TK 1/2 T PO BID FOR 3 TO 5 DAYS  1  . valACYclovir (VALTREX) 500 MG tablet valacyclovir 500 mg tablet  TK 1 T PO QD     No current facility-administered medications for this visit.    REVIEW OF SYSTEMS:   Constitutional: Denies fevers, abnormal night sweats (+) fatigue (+) occasional chills (+) decreased appetite  Eyes: Denies blurriness of vision, double vision or watery eyes Ears, nose, mouth, throat, and face: Denies mucositis or sore throat Respiratory: Denies cough, dyspnea or wheezes Cardiovascular: Denies palpitation, chest discomfort (+) Right lower extremity swelling resolved Gastrointestinal:  Denies vomiting, heartburn, or blood in stool. (+) Mild right upper quadrant tenderness (+) Irregular bowel pattern at baseline, no change (+) bloating (+) intermittent nausea (+)  urgent bowel movements. (+) stool leakage and incontinence Skin: Denies abnormal skin rashes Lymphatics: Denies new lymphadenopathy or easy bruising Neurological:Denies numbness, tingling or new weaknesses Musculoskeletal: Denies joint aches or pains. Behavioral/Psych: Mood is stable, no new changes  (+) anxiety and depression All other systems were reviewed with the patient and are negative.  PHYSICAL  EXAMINATION: ECOG PERFORMANCE STATUS: 1 - Symptomatic but completely ambulatory  Vitals:   04/19/17 1056  BP: 112/72  Pulse: 65  Resp: 18  Temp: 98.2 F (36.8 C)  SpO2: 100%   Filed Weights   04/19/17 1056  Weight: 199 lb 4.8 oz (90.4 kg)    GENERAL:alert, no distress and comfortable SKIN: skin color, texture, turgor are normal, no rashes or significant lesions EYES: normal, conjunctiva are pink and non-injected, sclera clear OROPHARYNX:no exudate, no erythema and lips, buccal mucosa, and tongue normal  NECK: supple, thyroid normal size, non-tender, without nodularity LYMPH:  no palpable Cervical or supraclavicular lymphadenopathy  LUNGS: clear to auscultation bilaterally, normal breathing effort HEART: regular rate & rhythm, S1 and S2 present, no murmurs, no lower extremity edema ABDOMEN: abdomen soft, round, non-tender; normal bowel sounds throughout. Incision site well healed. (+) Clear navel discharge, no erythema.  Musculoskeletal:no cyanosis of digits and no clubbing  PSYCH: alert & oriented x 3 with fluent speech NEURO: no focal motor/sensory deficits RECTAL: Skin intact without erythema. Normal sphincter tone. Digital rectal exam revealed no abnormal findings. Some stool in the rectal vault.   LABORATORY DATA:  I have reviewed the data as listed CBC Latest Ref Rng & Units 04/19/2017 04/13/2017 12/01/2016  WBC 3.9 - 10.3 10e3/uL 3.5(L) 4.9 4.3  Hemoglobin 11.6 - 15.9 g/dL 13.0 13.2 12.3  Hematocrit 34.8 - 46.6 % 39.1 37.8 37.4  Platelets 145 - 400 10e3/uL 235 - 225     CMP Latest Ref Rng & Units 04/19/2017 12/01/2016 09/13/2016  Glucose 70 - 140 mg/dl 83 103(H) 86  BUN 7.0 - 26.0 mg/dL 9.7 18 9.5  Creatinine 0.6 - 1.1 mg/dL 0.8 0.76 0.8  Sodium 136 - 145 mEq/L 143 141 143  Potassium 3.5 - 5.1 mEq/L 4.3 3.8 3.7  Chloride 96 - 106 mmol/L - 100 -  CO2 22 - 29 mEq/L '29 27 28  ' Calcium 8.4 - 10.4 mg/dL 9.9 9.2 9.8  Total Protein 6.4 - 8.3 g/dL 7.6 7.1 7.7  Total Bilirubin 0.20 - 1.20 mg/dL 0.80 0.6 0.79  Alkaline Phos 40 - 150 U/L 105 108 95  AST 5 - 34 U/L '19 17 23  ' ALT 0 - 55 U/L '11 11 16   ' CEA 12/18/2015: 0.9 04/27/2016: 0.6 09/13/2016: 0.8 04/19/17: <1.0  PATHOLOGY REPORT Diagnosis 12/03/2015 Colon, sigmoid, mass, biopsy -Invasive adenocarcinoma, moderately differentiated. See comment. Comment: due to the presence of adenocarcinoma, IHC for DNA mismatch repair proteins will be performed and reported in an addendum.  Diagnosis 03/21/2016 1. Colon, segmental resection for tumor, rectosigmoid - INVASIVE WELL DIFFERENTIATED ADENOCARCINOMA, SPANNING 5 CM IN GREATEST DIMENSION. - TUMOR INVADES THROUGH MUSCULARIS PROPRIA TO INVOLVE SUBSEROSAL SOFT TISSUES. - MARGINS ARE NEGATIVE. - ONE OF TWENTY-SEVEN LYMPH NODES IS POSITIVE FOR METASTATIC ADENOCARCINOMA (1/27). - SEE ONCOLOGY TEMPLATE. 2. Colon, resection margin (donut), distal rectal, final margin - BENIGN COLORECTAL MUCOSA. - NO TUMOR SEEN. Microscopic Comment 1. COLON AND RECTUM (INCLUDING TRANS-ANAL RESECTION): Specimen: Rectosigmoid colon. Procedure: Robotic lower anterior resection. Tumor site: Rectum. Specimen integrity: Intact. Macroscopic intactness of mesorectum: Complete. Macroscopic tumor perforation: Not identified. Invasive tumor: Maximum size: 5 cm. Histologic type(s): Adenocarcinoma. Histologic grade and differentiation: G1: well differentiated/low grade. Type of polyp in which invasive carcinoma arose: Tubular adenoma with high grade dysplasia. Microscopic extension of invasive  tumor: Tumor invades through muscularis propria to involve subserosal soft tissues. Lymph-Vascular invasion: Although definitive lymph/vascular invasion is not identified, there is lymph node positivity (see below). Peri-neural invasion: None identified.  Tumor deposit(s) (discontinuous extramural extension): None identified. Resection margins: Proximal margin: Negative. Distal margin: Negative. Circumferential (radial) (posterior ascending, posterior descending; lateral and posterior mid-rectum; and entire lower 1/3 rectum): Negative. Mesenteric margin: Not applicable. Distance closest margin (if all above margins negative): .2.4 cm (distal margin) Trans-anal resection margins only: Not applicable. Deep margin: Not applicable. Mucosal Margin: Not applicable. Distance closest mucosal margin (if negative): Not applicable. Treatment effect (neo-adjuvant therapy): Minimal treatment effect is present in the primary tumor. Minimal treatment effect is also present in the positive lymph nodes with the other lymph nodes showing no degree of treatment effect. Additional polyp(s): No additional polyps identified. Non-neoplastic findings: Diverticulosis. Lymph nodes: number examined 27; number positive: 1. Pathologic Staging: ypT3, ypN1a. Ancillary studies: As the patient is status-post neoadjuvant therapy, MSI by PCR and MMR by [facility] will not be sent.  RADIOGRAPHIC STUDIES: I have personally reviewed the radiological images as listed and agreed with the findings in the report.  CT AP 12/12/16 IMPRESSION: 1. Status post resection of rectal carcinoma. No complications identified and no specific features identified to suggest residual/recurrence of tumor along the suture line. 2. Stable tiny low-attenuation foci within the liver. This is too small to characterize. 3. No specific findings identified to suggest metastatic disease.  COLONOSCOPY DR. MEDOFF 12/03/2015 Impression Malignant partially  obstructing tumor in the proximal rectum, its diameter measured 6 mm, biopsy. Distal margin at 15 cm.   Colonoscopy Dr. Earlean Shawl 03/03/2017 result pending  ASSESSMENT & PLAN:  51 y.o. Caucasian female, without significant past medical history, presented with abdominal pain and rectal bleeding. Colonoscopy showed a obstructing tumor in the proximal rectum. Status post neoadjuvant chemoradiation with Xeloda from 12/30/2015 to 02/08/2016 and rectosigmoid segmental resection on 03/21/2016. She received 1 dose of adjuvant CAPOX but refused to complete therapy due to neuro symptoms during oxaliplatin. She was encouraged by Dr. Burr Medico to consider single agent Xeloda but declined. She is now on surveillance.  1. Rectal adenocarcinoma, proximal rectum, cT3N1M0, ypT3N1aM0, stage IIIB, moderately differentiated -Lab and surveillance CT scan from 12/12/2016 results previously discussed with the patient .  -Her most recent colonoscopy has been requested from Dr. Liliane Channel office; she reports a polyp was removed -She is scheduled for an MRI of the abdomen on 04/25/2017 per Dr. Earlean Shawl CEA <1.0 -I will order a CT chest due in 1 month as surveillance. If clear will repeat scans once a year.   -F/u in 4 months then every 6 months.  2. Fatigue  -Possibly related to her surgery and chemotherapy, she is recovering slowly. -Strongly encouraged her to gradually increase her activity level, and exercise regularly, to get her strength back.  3. Genetics -she was seen by genetic counselor, and her genetic test was negative for lynch syndrome or the following 32 genes: APC, ATM, AXIN2, BARD1, BMPR1A, BRCA1, BRCA2, BRIP1, CDH1, CDK4, CDKN2A, CHEK2, EPCAM, FANCC, MLH1, MSH2, MSH6, MUTYH, NBN, PALB2, PMS2, POLD1, POLE, PTEN, RAD51C, RAD51D, SCG5/GREM1, SMAD4, STK11, TP53, VHL, and XRCC2.    4. Nausea -She has not been using Compazine -She suspects some nausea is due to a gas leak in her home that is now resolved. -She  continues close follow-up with Dr. Earlean Shawl  5. Anxiety/Depression -She has high stress due to family issues -headaches may be related -I referred her to our social worker to start talking with someone.   6. Vaginal irritation -On Keflex for cellulitis -Encouraged to f/u with PCP  7. Numbness/tingling -Not present at this time  -f/u with PCP  for Neuro referral if symptoms return  Plan -Refer to Social worker for anxiety counseling -Screening CT chest in 1 month -MRI abdomen on 04/25/2017 per Dr. Earlean Shawl -Scans will be done annually if normal this time -f/u with PCP for vaginal irritation while on ABX  -Lab and F/u in 4 months then in 6 months  All questions were answered. The patient knows to call the clinic with any problems, questions or concerns.  I spent 20 minutes counseling the patient face to face. The total time spent in the appointment was 25 minutes and more than 50% was on counseling.  Cira Rue, AGNP-C 04/19/2017   I have seen the patient, examined her. I agree with the assessment and and plan and have edited the notes.   This document serves as a record of services personally performed by Truitt Merle, MD. It was created on her behalf by Joslyn Devon, a trained medical scribe. The creation of this record is based on the scribe's personal observations and the provider's statements to them. This document has been checked and approved by the attending provider.   I have reviewed the above documentation for accuracy and completeness and I agree with the above.   Truitt Merle, MD 04/19/2017

## 2017-04-13 NOTE — Patient Instructions (Addendum)
  Go to the emergency department to check-in.  They will page the vascular tech.  Let them know that you are out-patient.  You must say this to them.  Do not check into the ED  [facility].     IF you received an x-ray today, you will receive an invoice from [facility]. Please contact [facility] at [phone] with questions or concerns regarding your invoice.   IF you received labwork today, you will receive an invoice from [place]. Please contact [facility] at [phone] with questions or concerns regarding your invoice.   Our billing staff will not be able to assist you with questions regarding bills from these companies.  You will be contacted with the lab results as soon as they are available. The fastest way to get your results is to activate your My Chart account. Instructions are located on the last page of this paperwork. If you have not heard from [place] regarding the results in 2 weeks, please contact this office.

## 2017-04-14 ENCOUNTER — Telehealth: Payer: Self-pay | Admitting: Physician Assistant

## 2017-04-14 MED ORDER — CEPHALEXIN 500 MG PO CAPS: 500.0000 mg | ORAL_CAPSULE | Freq: Four times a day (QID) | ORAL | 0 refills | Status: AC

## 2017-04-14 NOTE — Telephone Encounter (Signed)
LVM that she needs to complete her course of antibiotic even if she is feeling better. Advised to call back if she had any other questions.

## 2017-04-14 NOTE — Telephone Encounter (Signed)
Pt was seen here on 04/13/17 and she was prescribed cephALEXin (KEFLEX) 500 MG capsule [[id number]]. She is feeling much better, the swelling has gone down. She would like to know if she should still take her med?

## 2017-04-19 ENCOUNTER — Other Ambulatory Visit (HOSPITAL_BASED_OUTPATIENT_CLINIC_OR_DEPARTMENT_OTHER): Payer: 59

## 2017-04-19 ENCOUNTER — Ambulatory Visit (HOSPITAL_BASED_OUTPATIENT_CLINIC_OR_DEPARTMENT_OTHER): Payer: 59 | Admitting: Hematology

## 2017-04-19 ENCOUNTER — Telehealth: Payer: Self-pay | Admitting: Hematology

## 2017-04-19 VITALS — BP 112/72 | HR 65 | Temp 98.2°F | Resp 18 | Ht 63.0 in | Wt 199.3 lb

## 2017-04-19 DIAGNOSIS — C20 Malignant neoplasm of rectum: Secondary | ICD-10-CM | POA: Diagnosis not present

## 2017-04-19 DIAGNOSIS — F419 Anxiety disorder, unspecified: Secondary | ICD-10-CM

## 2017-04-19 DIAGNOSIS — Z85048 Personal history of other malignant neoplasm of rectum, rectosigmoid junction, and anus: Secondary | ICD-10-CM | POA: Diagnosis not present

## 2017-04-19 LAB — CBC WITH DIFFERENTIAL/PLATELET
BASO%: 1.2 % (ref 0.0–2.0)
Basophils Absolute: 0 10*3/uL (ref 0.0–0.1)
EOS%: 3.8 % (ref 0.0–7.0)
Eosinophils Absolute: 0.1 10*3/uL (ref 0.0–0.5)
HCT: 39.1 % (ref 34.8–46.6)
HGB: 13 g/dL (ref 11.6–15.9)
LYMPH%: 19.3 % (ref 14.0–49.7)
MCH: 29.4 pg (ref 25.1–34.0)
MCHC: 33.2 g/dL (ref 31.5–36.0)
MCV: 88.6 fL (ref 79.5–101.0)
MONO#: 0.3 10*3/uL (ref 0.1–0.9)
MONO%: 9.2 % (ref 0.0–14.0)
NEUT#: 2.3 10*3/uL (ref 1.5–6.5)
NEUT%: 66.5 % (ref 38.4–76.8)
Platelets: 235 10*3/uL (ref 145–400)
RBC: 4.41 10*6/uL (ref 3.70–5.45)
RDW: 14.8 % — ABNORMAL HIGH (ref 11.2–14.5)
WBC: 3.5 10*3/uL — ABNORMAL LOW (ref 3.9–10.3)
lymph#: 0.7 10*3/uL — ABNORMAL LOW (ref 0.9–3.3)

## 2017-04-19 LAB — COMPREHENSIVE METABOLIC PANEL
ALT: 11 U/L (ref 0–55)
AST: 19 U/L (ref 5–34)
Albumin: 3.9 g/dL (ref 3.5–5.0)
Alkaline Phosphatase: 105 U/L (ref 40–150)
Anion Gap: 9 mEq/L (ref 3–11)
BUN: 9.7 mg/dL (ref 7.0–26.0)
CO2: 29 mEq/L (ref 22–29)
Calcium: 9.9 mg/dL (ref 8.4–10.4)
Chloride: 106 mEq/L (ref 98–109)
Creatinine: 0.8 mg/dL (ref 0.6–1.1)
EGFR: 82 mL/min/{1.73_m2} — ABNORMAL LOW (ref >90)
Glucose: 83 mg/dl (ref 70–140)
Potassium: 4.3 mEq/L (ref 3.5–5.1)
Sodium: 143 mEq/L (ref 136–145)
Total Bilirubin: 0.8 mg/dL (ref 0.20–1.20)
Total Protein: 7.6 g/dL (ref 6.4–8.3)

## 2017-04-19 LAB — CEA (IN HOUSE-CHCC): CEA (CHCC-In House): <1 ng/mL (ref 0.00–5.00)

## 2017-04-19 NOTE — Telephone Encounter (Signed)
Gave patient AVS and calendar of upcoming January appointments °

## 2017-04-21 ENCOUNTER — Encounter: Payer: Self-pay | Admitting: Hematology

## 2017-04-25 ENCOUNTER — Other Ambulatory Visit: Payer: 59

## 2017-05-18 ENCOUNTER — Inpatient Hospital Stay: Admission: RE | Admit: 2017-05-18 | Payer: 59 | Source: Ambulatory Visit

## 2017-05-18 ENCOUNTER — Other Ambulatory Visit: Payer: 59

## 2017-06-07 ENCOUNTER — Other Ambulatory Visit: Payer: Self-pay

## 2017-08-20 ENCOUNTER — Telehealth: Payer: Self-pay | Admitting: Hematology

## 2017-08-20 NOTE — Telephone Encounter (Signed)
S/W PT TO CONFIRM 1/8 APPT. SHE SAYS SHELEFT VM ASKING TO R/S APPT. PT ASKS FOR A NEW APPT A FEW WEEKS OUT DUE TO FAMILY ISSUES. GAVE PT APPT FOR 2/1 @ 12.30.

## 2017-08-21 ENCOUNTER — Ambulatory Visit: Payer: 59 | Admitting: Hematology

## 2017-08-21 ENCOUNTER — Other Ambulatory Visit: Payer: 59

## 2017-08-22 ENCOUNTER — Other Ambulatory Visit: Payer: Self-pay

## 2017-08-22 ENCOUNTER — Ambulatory Visit: Payer: Self-pay | Admitting: Hematology

## 2017-09-13 NOTE — Progress Notes (Signed)
[place]  [phone] [phone]  Clinic follow Up Note   Patient Care Team: Joretta Bachelor, [place] as PCP - General (Physician Assistant) Truitt Merle, MD as Consulting Physician (Hematology) Kyung Rudd, MD as Consulting Physician (Radiation Oncology) Jackolyn Confer, MD as Consulting Physician (General Surgery) Richmond Campbell, MD as Consulting Physician (Gastroenterology) Milus Banister, MD as Attending Physician (Gastroenterology) Tania Ade, RN as Registered Nurse Leighton Ruff, MD as Consulting Physician (General Surgery) 09/15/2017   CHIEF COMPLAINTS:  Follow up rectal cancer  Oncology History   Rectal cancer [facility])   Staging form: Colon and Rectum, AJCC 7th Edition     Clinical stage from 12/03/2015: Stage IIIB (T3, N1, M0) - Signed by Truitt Merle, MD on 12/18/2015       Rectal cancer ([place])   12/03/2015 Initial Diagnosis    Rectal cancer ([place])      12/03/2015 Procedure    Colonoscopy showed a partially obstructing tumor in the proximal rectum, its diameter measured 6 mm, biopsed      12/03/2015 Initial Biopsy    Rectum mass biopsy showed adenocarcinoma, moderately differentiated      12/10/2015 Imaging    CT abdomen and pelvis with contrast showed luminal narrowing and mural thickening in the proximal to mid rectum. There is a 6 mm short axis mesorectal lymph node suspicious for metastasis. There are several tiny indeterminate liver lesions       12/17/2015 Imaging    MRI pelvis with and without contrast showed a advanced T3 rectal adenocarcinoma (7cm), N1 disease (at least 2 left-sided nasal rectal lymph nodes are seen measuring 6-7 mm), distance from tumor to the sphincter is 8 cm      12/17/2015 Imaging    CT chest with contrast showed no evidence of metastasis or other changes      12/30/2015 - 02/08/2016 Radiation Therapy    neoadjuvant radiation to rectal cancer       12/30/2015 - 02/08/2016 Chemotherapy    xeloda  1526m twice daily with concurent radiation, she tolerated well       03/15/2016 Imaging    CT AP IMPRESSION: 1. No acute findings within the abdomen or pelvis. No evidence for metastatic disease. 2. Mild wall thickening involving the distal sigmoid colon and rectum is identified. Nonspecific and may reflect sequelae of external beam radiation.      03/21/2016 Surgery    Rectosigmoid segmental resection       03/21/2016 Pathology Results    Invasive well differentiated adenocarcinoma, 5cm,  margins are negative, LVI (-), 1 out of 27 nodes were negative.        05/05/2016 - 05/05/2016 Adjuvant Chemotherapy    Adjuvant CAPOX, she had facial numbness and SOB after she completed the first oxaliplatin infusion, and refused more oxaliplatin       12/12/2016 Imaging    CT AP IMPRESSION: 1. Status post resection of rectal carcinoma. No complications identified and no specific features identified to suggest residual/recurrence of tumor along the suture line. 2. Stable tiny low-attenuation foci within the liver. This is too small to characterize. 3. No specific findings identified to suggest metastatic disease.       HISTORY OF PRESENTING ILLNESS:  Ragna AOuida Sills52y.o. female is here because of her newly diagnosed rectal cancer. She is accompanied by her daughter and her mother to our multidisciplinary GI clinic today.  She has had chronic abdominal pain for 2-3 years, in the epigastric area and low abdomen,  which has been worse lately, and she noticed constipation, abdominal bloating and mild rectal bleeding for 2 months. She was referred to Dr. Earlean Shawl and underwent colonoscopy on 12/03/2015. A partial obstructive rectal mass was found in the proximal rectum, biopsy showed adenocarcinoma. She was referred to surgeon Dr. Zella Richer who referred her to [place] to consider new adjuvant chemoradiation.  She feels well overall, has mild fatigue, but able to tolerate her routine full-time job in daily  activities without difficulties. She has good appetite, but eats less due to the epigastric pain after meal. She denies any other significant pain, cough, or other symptoms. No recent weight loss. She has been on phentermine for weight loss recently, and stopped a few days ago.  CURRENT THERAPY:  Surveillance  INTERIM HISTORY:  Lexy Meininger returns for follow up. She present to the clinic today alone. She reports she is still having diarrhea intermittently. She notes she is also have yellow loose stool intermittently. She recently joined a gym and is trying to be more active. She notes to feel some mild weakness in her hips and she thinks exercising will help this. She has not seen Dr. Marcello Moores in a year so she will follow up soon either in March or April.   She reports that her mother passed away recently after breaking her hip. She is feeling stressed and grieving from this. This caused her to miss her abdominal MRI.   On review of systems, pt denies bowel incontinence, blood in the stool, melena, nausea or any other complaints at this time. Pertinent positives are listed and detailed within the above HPI.   MEDICAL HISTORY:  Past Medical History:  Diagnosis Date  . Anemia    during pregnancy, not now.  . 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 Atypical syncope    1 episode -never had follow up with cardiology-never any further episodes, did go to urgent care.  . Colon cancer [facility])    rectal cancer Radiation and oral chemo with radiation.  . Complication of anesthesia   . Family history of breast cancer   . Family history of uterine cancer   . Genital lesion, female    at times  . Heart burn    occ.  Marland Kitchen Hx of seasonal allergies   . Motion sickness   . PONV (postoperative nausea and vomiting)   . Rectal cancer ([place])   . Umbilicus discharge     SURGICAL HISTORY: Past Surgical History:  Procedure Laterality Date  . ABDOMINAL HYSTERECTOMY      atypical cell, hx abnormal pap test  . CESAREAN SECTION    . COLON SURGERY    . DIAGNOSTIC LAPAROSCOPY     with D and C and Ovarian cystectomy, tubal ligation done at this time '00  . DILATION AND CURETTAGE OF UTERUS    . TUBAL LIGATION    . XI ROBOTIC ASSISTED LOWER ANTERIOR RESECTION N/A 03/21/2016   Procedure: XI ROBOTIC ASSISTED LOWER ANTERIOR RESECTION;  Surgeon: Leighton Ruff, MD;  Location: [facility];  Service: General;  Laterality: N/A;    SOCIAL HISTORY: Social History   Socioeconomic History  . Marital status: Legally Separated    Spouse name: Not on file  . Number of children: 3  . Years of education: Not on file  . Highest education level: Not on file  Social Needs  . Financial resource strain: Not on file  . Food insecurity - worry: Not on file  . Food insecurity -  inability: Not on file  . Transportation needs - medical: Not on file  . Transportation needs - non-medical: Not on file  Occupational History  . Not on file  Tobacco Use  . Smoking status: Never Smoker  . Smokeless tobacco: Never Used  Substance and Sexual Activity  . Alcohol use: No    Alcohol/week: 0.0 oz  . Drug use: No  . Sexual activity: Not on file  Other Topics Concern  . Not on file  Social History Narrative  . Not on file   She has 3 children, she lives with her twin sons who are 60. She works for [employer]   FAMILY HISTORY: Family History  Problem Relation Age of Onset  . Breast cancer Maternal Grandmother        dx in her 55s  . Cancer Paternal Grandmother 3       uterine cancer   . Hypertension Father   . COPD Father   . Hypertension Sister   . Hypertension Paternal Grandfather   . Heart attack Paternal Grandfather   . Hypertension Sister   . Skin cancer Cousin        maternal first cousin    ALLERGIES:  is allergic to codeine; morphine and related; sucralose; and zolpidem tartrate.  MEDICATIONS:  Current Outpatient Medications  Medication Sig Dispense Refill    . estradiol (ESTRACE) 2 MG tablet Take 2 mg by mouth daily.  0  . phentermine 37.5 MG capsule TK 1 C PO ONCE D IN THE MORNING  2  . polyethylene glycol (MIRALAX / GLYCOLAX) packet Take 17 g by mouth daily as needed for moderate constipation.     No current facility-administered medications for this visit.      REVIEW OF SYSTEMS:   Constitutional: Denies fevers, abnormal night sweats (+) fatigue, improved Eyes: Denies blurriness of vision, double vision or watery eyes Ears, nose, mouth, throat, and face: Denies mucositis or sore throat Respiratory: Denies cough, dyspnea or wheezes Cardiovascular: Denies palpitation, chest discomfort (+) Right lower extremity swelling resolved Gastrointestinal:  Denies vomiting, heartburn, or blood in stool. (+) Mild right upper quadrant tenderness (+) Irregular bowel pattern at baseline, no change (+) bloating  (+) urgent bowel movements.  Skin: Denies abnormal skin rashes Lymphatics: Denies new lymphadenopathy or easy bruising Neurological:Denies numbness, tingling or new weaknesses Musculoskeletal: Denies joint aches or pains. Behavioral/Psych: Mood is stable, no new changes  (+) anxiety and depression All other systems were reviewed with the patient and are negative.   PHYSICAL EXAMINATION: ECOG PERFORMANCE STATUS: 1 - Symptomatic but completely ambulatory  Vitals:   09/15/17 1318  BP: 110/73  Pulse: 74  Resp: 18  Temp: 98.3 F (36.8 C)  SpO2: 100%   Filed Weights   09/15/17 1318  Weight: 196 lb 9.6 oz (89.2 kg)   GENERAL:alert, no distress and comfortable SKIN: skin color, texture, turgor are normal, no rashes or significant lesions EYES: normal, conjunctiva are pink and non-injected, sclera clear OROPHARYNX:no exudate, no erythema and lips, buccal mucosa, and tongue normal  NECK: supple, thyroid normal size, non-tender, without nodularity LYMPH:  no palpable Cervical or supraclavicular lymphadenopathy  LUNGS: clear to auscultation  bilaterally, normal breathing effort HEART: regular rate & rhythm, S1 and S2 present, no murmurs, no lower extremity edema ABDOMEN: abdomen soft, round, non-tender; normal bowel sounds throughout. Incision site well healed. (+) Clear navel discharge, no erythema.  Musculoskeletal:no cyanosis of digits and no clubbing  PSYCH: alert & oriented x 3 with fluent  speech NEURO: no focal motor/sensory deficits RECTAL: Declined today    LABORATORY DATA:  I have reviewed the data as listed CBC Latest Ref Rng & Units 09/15/2017 04/19/2017 04/13/2017  WBC 3.9 - 10.3 K/uL 3.0(L) 3.5(L) 4.9  Hemoglobin 11.6 - 15.9 g/dL 12.6 13.0 13.2  Hematocrit 34.8 - 46.6 % 37.8 39.1 37.8  Platelets 145 - 400 K/uL 236 235 -    CMP Latest Ref Rng & Units 09/15/2017 04/19/2017 12/01/2016  Glucose 70 - 140 mg/dL 76 83 103(H)  BUN 7 - 26 mg/dL 9 9.7 18  Creatinine 0.60 - 1.10 mg/dL 0.81 0.8 0.76  Sodium 136 - 145 mmol/L 140 143 141  Potassium 3.5 - 5.1 mmol/L 4.0 4.3 3.8  Chloride 98 - 109 mmol/L 106 - 100  CO2 22 - 29 mmol/L '22 29 27  ' Calcium 8.4 - 10.4 mg/dL 8.9 9.9 9.2  Total Protein 6.4 - 8.3 g/dL 7.3 7.6 7.1  Total Bilirubin 0.2 - 1.2 mg/dL 1.1 0.80 0.6  Alkaline Phos 40 - 150 U/L 98 105 108  AST 5 - 34 U/L '18 19 17  ' ALT 0 - 55 U/L '9 11 11   ' CEA 12/18/2015: 0.9 04/27/2016: 0.6 09/13/2016: 0.8   PATHOLOGY REPORT Diagnosis 12/03/2015 Colon, sigmoid, mass, biopsy -Invasive adenocarcinoma, moderately differentiated. See comment.  Comment: due to the presence of adenocarcinoma, IHC for DNA mismatch repair proteins will be performed and reported in an addendum.  Diagnosis 03/21/2016 1. Colon, segmental resection for tumor, rectosigmoid - INVASIVE WELL DIFFERENTIATED ADENOCARCINOMA, SPANNING 5 CM IN GREATEST DIMENSION. - TUMOR INVADES THROUGH MUSCULARIS PROPRIA TO INVOLVE SUBSEROSAL SOFT TISSUES. - MARGINS ARE NEGATIVE. - ONE OF TWENTY-SEVEN LYMPH NODES IS POSITIVE FOR METASTATIC ADENOCARCINOMA (1/27). - SEE  ONCOLOGY TEMPLATE. 2. Colon, resection margin (donut), distal rectal, final margin - BENIGN COLORECTAL MUCOSA. - NO TUMOR SEEN. Microscopic Comment 1. COLON AND RECTUM (INCLUDING TRANS-ANAL RESECTION): Specimen: Rectosigmoid colon. Procedure: Robotic lower anterior resection. Tumor site: Rectum. Specimen integrity: Intact. Macroscopic intactness of mesorectum: Complete. Macroscopic tumor perforation: Not identified. Invasive tumor: Maximum size: 5 cm. Histologic type(s): Adenocarcinoma. Histologic grade and differentiation: G1: well differentiated/low grade. Type of polyp in which invasive carcinoma arose: Tubular adenoma with high grade dysplasia. Microscopic extension of invasive tumor: Tumor invades through muscularis propria to involve subserosal soft tissues. Lymph-Vascular invasion: Although definitive lymph/vascular invasion is not identified, there is lymph node positivity (see below). Peri-neural invasion: None identified. Tumor deposit(s) (discontinuous extramural extension): None identified. Resection margins: Proximal margin: Negative. Distal margin: Negative. Circumferential (radial) (posterior ascending, posterior descending; lateral and posterior mid-rectum; and entire lower 1/3 rectum): Negative. Mesenteric margin: Not applicable. Distance closest margin (if all above margins negative): .2.4 cm (distal margin) Trans-anal resection margins only: Not applicable. Deep margin: Not applicable. Mucosal Margin: Not applicable. Distance closest mucosal margin (if negative): Not applicable. Treatment effect (neo-adjuvant therapy): Minimal treatment effect is present in the primary tumor. Minimal treatment effect is also present in the positive lymph nodes with the other lymph nodes showing no degree of treatment effect. Additional polyp(s): No additional polyps identified. Non-neoplastic findings: Diverticulosis. Lymph nodes: number examined 27; number positive:  1. Pathologic Staging: ypT3, ypN1a. Ancillary studies: As the patient is status-post neoadjuvant therapy, MSI by PCR and MMR by [facility] will not be sent.   RADIOGRAPHIC STUDIES: I have personally reviewed the radiological images as listed and agreed with the findings in the report.  CT AP 12/12/16 IMPRESSION: 1. Status post resection of rectal carcinoma. No complications identified and no specific  features identified to suggest residual/recurrence of tumor along the suture line. 2. Stable tiny low-attenuation foci within the liver. This is too small to characterize. 3. No specific findings identified to suggest metastatic disease.  COLONOSCOPY DR. MEDOFF 12/03/2015 Impression Malignant partially obstructing tumor in the proximal rectum, its diameter measured 6 mm, biopsy. Distal margin at 15 cm.    ASSESSMENT & PLAN:  52 y.o. Caucasian female, without significant past medical history, presented with abdominal pain and rectal bleeding. Colonoscopy showed a obstructing tumor in the proximal rectum.  1. Rectal adenocarcinoma, proximal rectum, cT3N1M0, ypT3N1aM0, stage IIIB, moderately differentiated --I previously reviewed her surgical path findings with pt in details -she has significant residual disease after neoadjuvant chemo and radiaiton -I previously discussed the high risk of cancer recurrence after surgery, I recommend adjuvant chemo -we previously discussed the option of FOLFOX and CAPOX. She opted CAPOX  -She has some neurological symptoms after first oxaliplatin infusion, and refuses to try again with premedication. She states she is not going to regret even if she had cancer recurrence. Chemo was held.  -I encouraged her to consider single agent Xeloda, which she took with radiation before. She declined. -I previously reveiwed CT scan from 12/12/2016 results with her, which showed no evidence of recurrence. -Patient had a colonoscopy with Dr. Earlean Shawl in July, 2018 -She is clinically  doing well overall, but does report intermittent diarrhea and urgent bowel movements. I informed pt that this is normal after chemo and that her digestive system may have changed. She declined rectal exam today -Continue cancer surveillance, She will see Dr. Marcello Moores within 3 months. I'll see her in 6 months with a CT Chest and Abdominal MRI in 3 months.     2. Fatigue  -Possibly related to her surgery and chemotherapy, she is recovering slowly. -I have strongly encouraged her to gradually increase her activity level, and exercise regularly, to get her strength back. -Resolved now   3. Genetics -she was seen by Dietitian, and her genetic test was negative for lynch syndrome or the following 32 genes: APC, ATM, AXIN2, BARD1, BMPR1A, BRCA1, BRCA2, BRIP1, CDH1, CDK4, CDKN2A, CHEK2, EPCAM, FANCC, MLH1, MSH2, MSH6, MUTYH, NBN, PALB2, PMS2, POLD1, POLE, PTEN, RAD51C, RAD51D, SCG5/GREM1, SMAD4, STK11, TP53, VHL, and XRCC2.    4. Anxiety/Depression -She has high stress due to family issues -headaches may be related -I referred her to our social worker to start talking with someone.   5.  Peripheral neuropathy G1 -Continue chemotherapy, mild and stable.  Plan: F/u in in 6 months with lab CT Chest and Abdominal MRI in 3 months F/u in 3 months with Dr. Marcello Moores, I sent a message to her    All questions were answered. The patient knows to call the clinic with any problems, questions or concerns.  I spent 20 minutes counseling the patient face to face. The total time spent in the appointment was 25 minutes and more than 50% was on counseling.  This document serves as a record of services personally performed by Truitt Merle, MD. It was created on her behalf by Theresia Bough, a trained medical scribe. The creation of this record is based on the scribe's personal observations and the provider's statements to them.   I have reviewed the above documentation for accuracy and completeness, and I agree  with the above.    Truitt Merle, MD 09/15/2017 1:53 PM

## 2017-09-15 ENCOUNTER — Inpatient Hospital Stay: Payer: 59 | Attending: Hematology

## 2017-09-15 ENCOUNTER — Telehealth: Payer: Self-pay | Admitting: Hematology

## 2017-09-15 ENCOUNTER — Inpatient Hospital Stay (HOSPITAL_BASED_OUTPATIENT_CLINIC_OR_DEPARTMENT_OTHER): Payer: 59 | Admitting: Hematology

## 2017-09-15 ENCOUNTER — Ambulatory Visit: Payer: Self-pay | Admitting: Hematology

## 2017-09-15 ENCOUNTER — Encounter: Payer: Self-pay | Admitting: Hematology

## 2017-09-15 VITALS — BP 110/73 | HR 74 | Temp 98.3°F | Resp 18 | Ht 63.0 in | Wt 196.6 lb

## 2017-09-15 DIAGNOSIS — R5383 Other fatigue: Secondary | ICD-10-CM

## 2017-09-15 DIAGNOSIS — R197 Diarrhea, unspecified: Secondary | ICD-10-CM | POA: Insufficient documentation

## 2017-09-15 DIAGNOSIS — G629 Polyneuropathy, unspecified: Secondary | ICD-10-CM | POA: Insufficient documentation

## 2017-09-15 DIAGNOSIS — F418 Other specified anxiety disorders: Secondary | ICD-10-CM

## 2017-09-15 DIAGNOSIS — Z9221 Personal history of antineoplastic chemotherapy: Secondary | ICD-10-CM | POA: Insufficient documentation

## 2017-09-15 DIAGNOSIS — R51 Headache: Secondary | ICD-10-CM

## 2017-09-15 DIAGNOSIS — Z923 Personal history of irradiation: Secondary | ICD-10-CM | POA: Insufficient documentation

## 2017-09-15 DIAGNOSIS — C20 Malignant neoplasm of rectum: Secondary | ICD-10-CM | POA: Insufficient documentation

## 2017-09-15 LAB — CBC WITH DIFFERENTIAL/PLATELET
Basophils Absolute: 0 10*3/uL (ref 0.0–0.1)
Basophils Relative: 1 %
Eosinophils Absolute: 0 10*3/uL (ref 0.0–0.5)
Eosinophils Relative: 1 %
HCT: 37.8 % (ref 34.8–46.6)
Hemoglobin: 12.6 g/dL (ref 11.6–15.9)
Lymphocytes Relative: 26 %
Lymphs Abs: 0.8 10*3/uL — ABNORMAL LOW (ref 0.9–3.3)
MCH: 29.7 pg (ref 25.1–34.0)
MCHC: 33.4 g/dL (ref 31.5–36.0)
MCV: 89 fL (ref 79.5–101.0)
Monocytes Absolute: 0.2 10*3/uL (ref 0.1–0.9)
Monocytes Relative: 6 %
Neutro Abs: 1.9 10*3/uL (ref 1.5–6.5)
Neutrophils Relative %: 66 %
Platelets: 236 10*3/uL (ref 145–400)
RBC: 4.25 MIL/uL (ref 3.70–5.45)
RDW: 13.7 % (ref 11.2–14.5)
WBC: 3 10*3/uL — ABNORMAL LOW (ref 3.9–10.3)

## 2017-09-15 LAB — COMPREHENSIVE METABOLIC PANEL
ALT: 9 U/L (ref 0–55)
AST: 18 U/L (ref 5–34)
Albumin: 3.9 g/dL (ref 3.5–5.0)
Alkaline Phosphatase: 98 U/L (ref 40–150)
Anion gap: 12 — ABNORMAL HIGH (ref 3–11)
BUN: 9 mg/dL (ref 7–26)
CO2: 22 mmol/L (ref 22–29)
Calcium: 8.9 mg/dL (ref 8.4–10.4)
Chloride: 106 mmol/L (ref 98–109)
Creatinine, Ser: 0.81 mg/dL (ref 0.60–1.10)
GFR calc Af Amer: >60 mL/min (ref >60)
GFR calc non Af Amer: >60 mL/min (ref >60)
Glucose, Bld: 76 mg/dL (ref 70–140)
Potassium: 4 mmol/L (ref 3.5–5.1)
Sodium: 140 mmol/L (ref 136–145)
Total Bilirubin: 1.1 mg/dL (ref 0.2–1.2)
Total Protein: 7.3 g/dL (ref 6.4–8.3)

## 2017-09-15 LAB — CEA (IN HOUSE-CHCC): CEA (CHCC-In House): <1 ng/mL (ref 0.00–5.00)

## 2017-09-15 NOTE — Telephone Encounter (Signed)
Gave avs and calendar for may and august

## 2017-09-16 ENCOUNTER — Encounter: Payer: Self-pay | Admitting: Hematology

## 2017-09-21 ENCOUNTER — Telehealth: Payer: Self-pay | Admitting: *Deleted

## 2017-09-21 NOTE — Telephone Encounter (Signed)
Called pt and left message on voice mail of CEA normal as per Dr. Ernestina Penna instructions.

## 2017-09-21 NOTE — Telephone Encounter (Signed)
-----   Message from Truitt Merle, MD sent at 09/16/2017  3:00 PM EST ----- Please let her know the normal CEA, thanks   Truitt Merle  09/16/2017

## 2017-09-29 IMAGING — CT CT ABD-PELV W/ CM
2 of 5 series · 16 of 46 positions shown, 18 images · IV contrast (ISOVUE)
Comparison: 12/10/2015

CLINICAL DATA: Rectal carcinoma.

EXAM:
CT ABDOMEN AND PELVIS WITH CONTRAST
TECHNIQUE: Multidetector CT imaging of the abdomen and pelvis was performed
using the standard protocol following bolus administration of
intravenous contrast.
CONTRAST:  100mL Q8ZO1Y-OOO IOPAMIDOL (Q8ZO1Y-OOO) INJECTION 61%

[Series 2: rtn a/p with · axial · 0.74mm/px · z∈[-433,-48]mm · 13 of 89 slices shown, 15 images]
[im 6/89  soft-tissue]
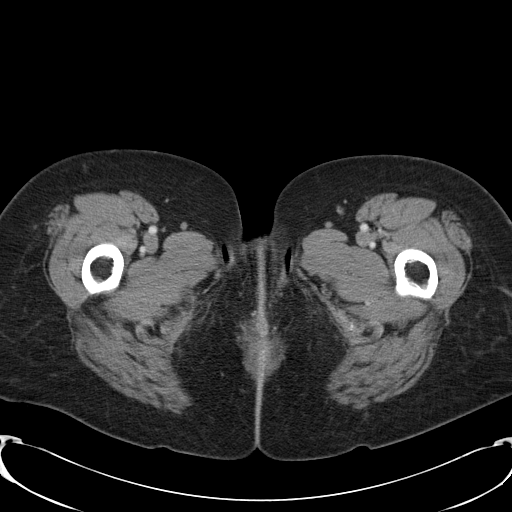
[im 6/89  bone]
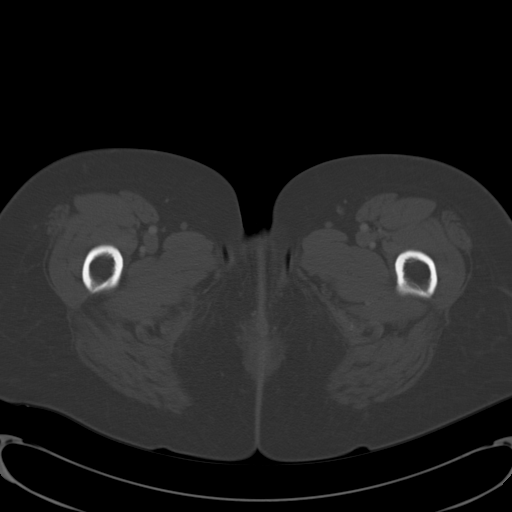
[im 12/89  soft-tissue]
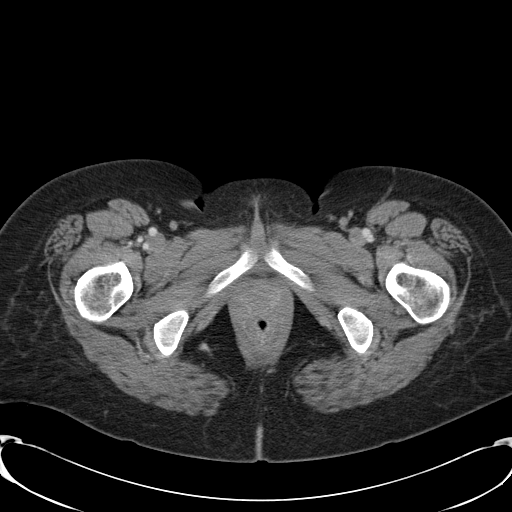
[im 17/89  soft-tissue]
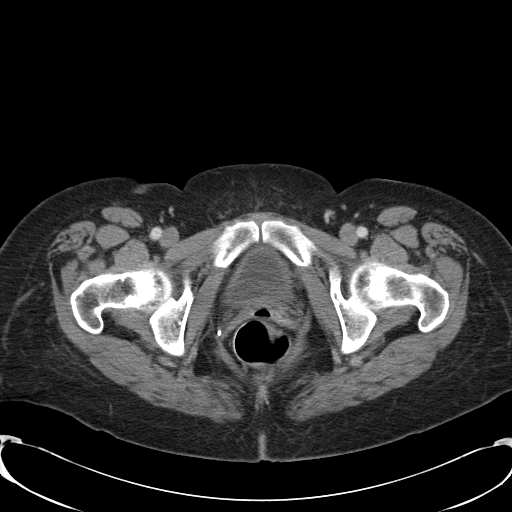
[im 28/89  soft-tissue]
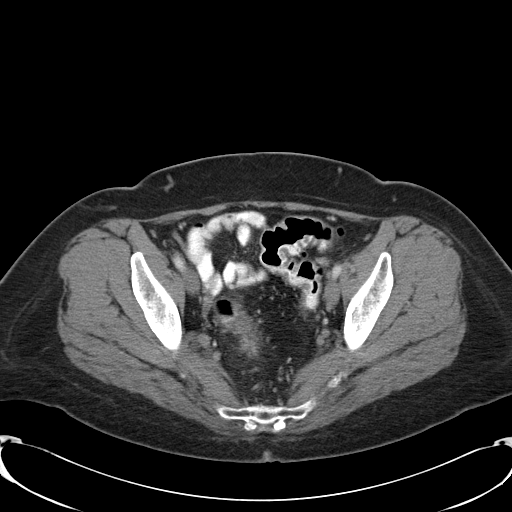
[im 34/89  soft-tissue]
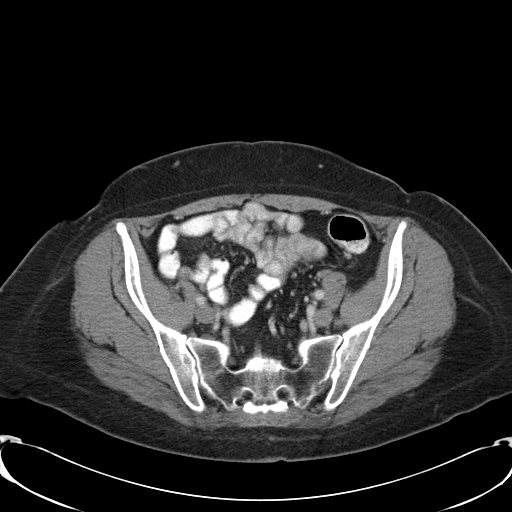
[im 39/89  soft-tissue]
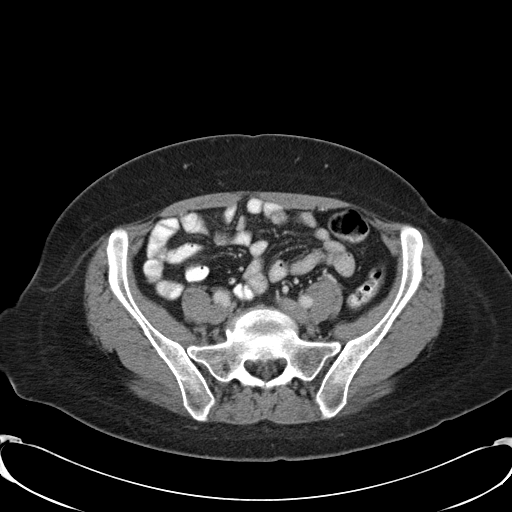
[im 45/89  soft-tissue]
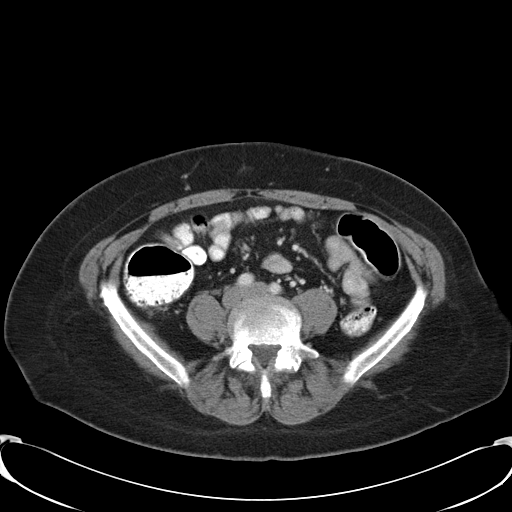
[im 50/89  soft-tissue]
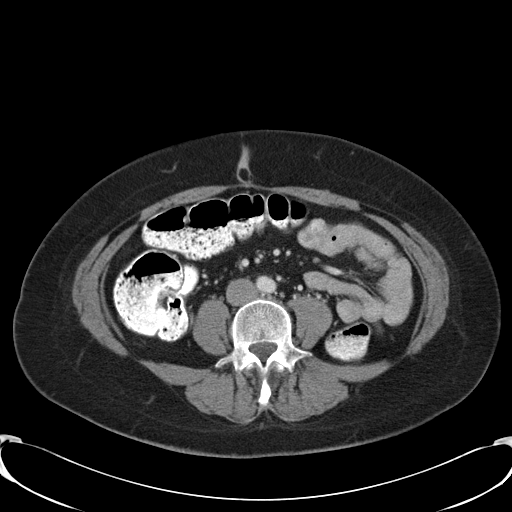
[im 56/89  soft-tissue]
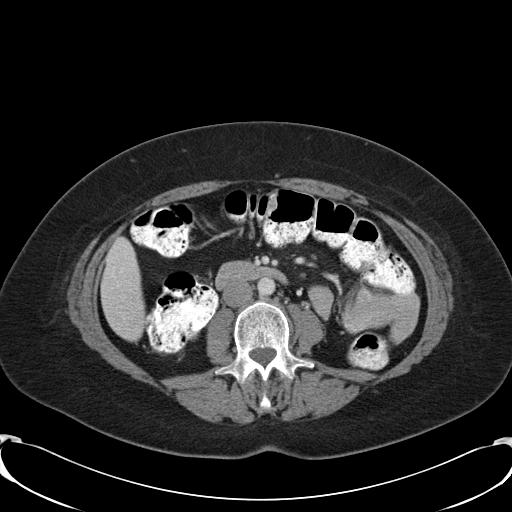
[im 56/89  bone]
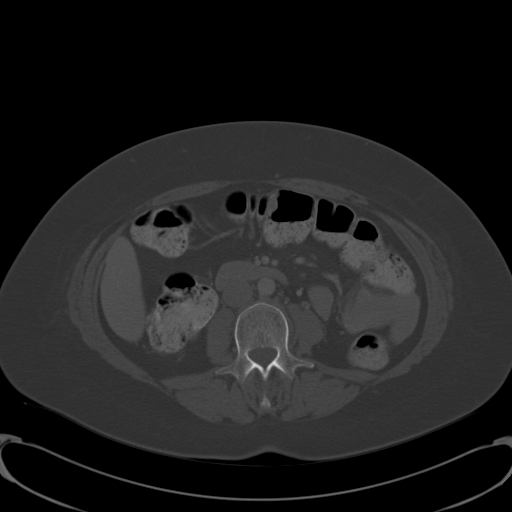
[im 61/89  soft-tissue]
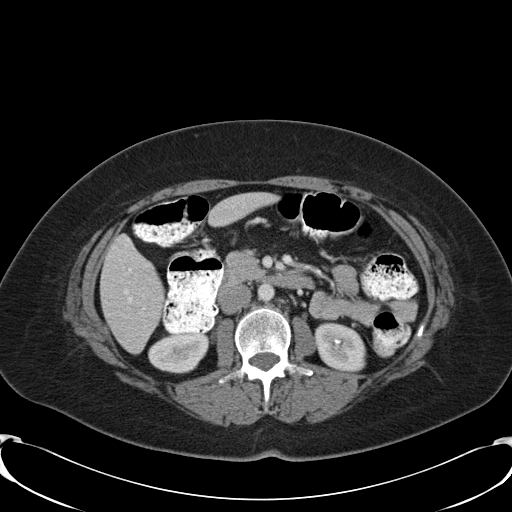
[im 72/89  soft-tissue]
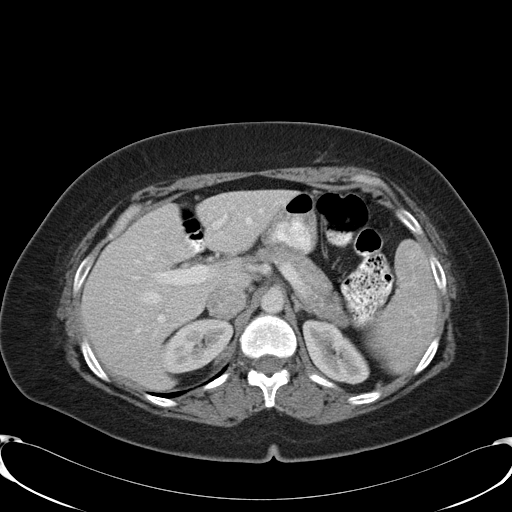
[im 78/89  soft-tissue]
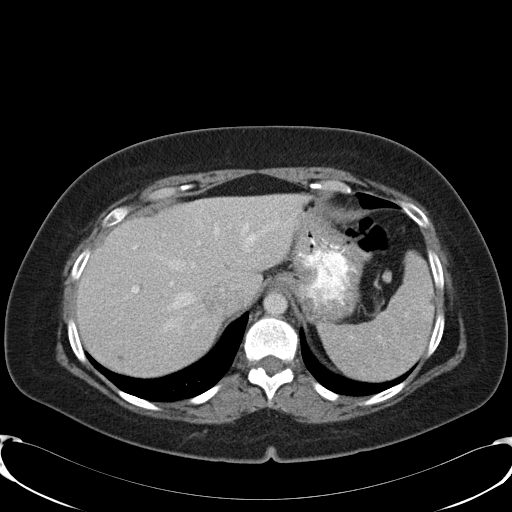
[im 83/89  soft-tissue]
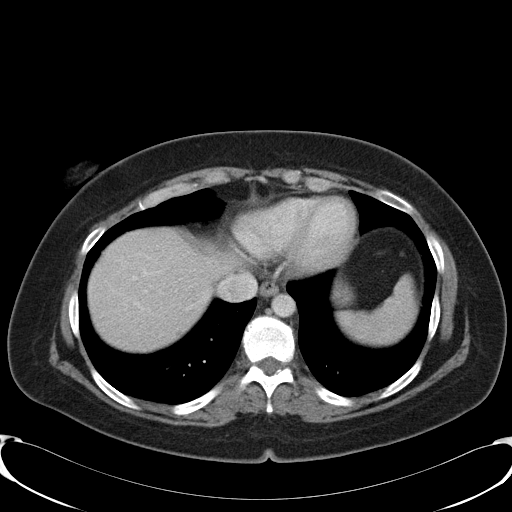

[Series 602: <mpr thick range> · coronal · 0.86mm/px · 3 of 129 slices shown]
[im 43/129  soft-tissue]
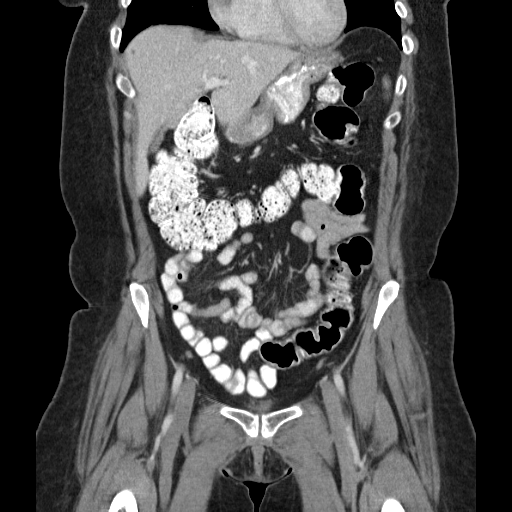
[im 57/129  soft-tissue]
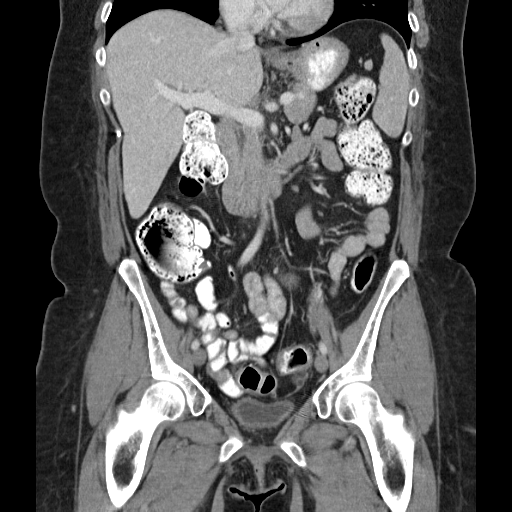
[im 72/129  soft-tissue]
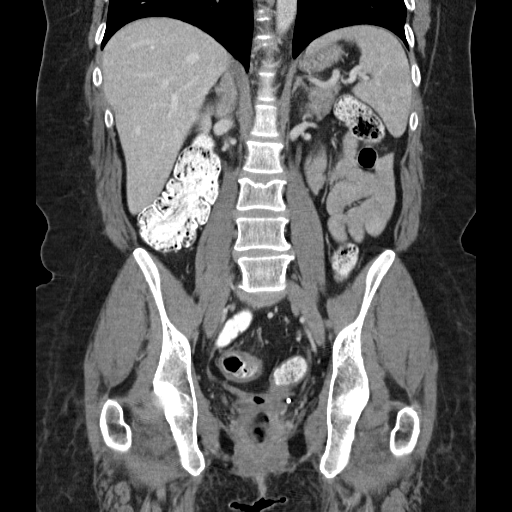

[16 of 46 positions shown; findings below may reference images not displayed]

FINDINGS: Lower chest: The lung bases are clear. No pleural or pericardial
effusion noted.

Hepatobiliary: There are several low-attenuation foci within the
liver which are too small to reliably characterize. These are
unchanged when compared with 12/18/2015 when a or characterized as
representing small cysts. The gallbladder is normal. There is no
biliary dilatation.

Pancreas: No mass, inflammatory changes, or other significant
abnormality.

Spleen: Within normal limits in size and appearance.

Adrenals/Urinary Tract: Normal appearance of the adrenal glands.
There are several small cysts within the inferior pole of the right
kidney. The left kidney appears normal. The urinary bladder appears
normal.

Stomach/Bowel: The stomach is within normal limits. The small bowel
loops have a normal course and caliber. No obstruction. Normal
appearance of the colon. The appendix is visualized and appears
normal. Mild nonspecific wall thickening involving the sigmoid colon
and rectum is again identified, image 64 of series 2.

Vascular/Lymphatic: Normal appearance of the abdominal aorta. No
enlarged retroperitoneal or mesenteric adenopathy. No enlarged
pelvic or inguinal lymph nodes.

Reproductive: The patient is status post hysterectomy. No adnexal
mass noted.

Other: None.

Musculoskeletal:  No suspicious bone lesions identified.
IMPRESSION: 1. No acute findings within the abdomen or pelvis. No evidence for
metastatic disease.
2. Mild wall thickening involving the distal sigmoid colon and
rectum is identified. Nonspecific and may reflect sequelae of
external beam radiation.

## 2017-11-22 ENCOUNTER — Encounter: Payer: Self-pay | Admitting: Physician Assistant

## 2017-12-12 ENCOUNTER — Telehealth: Payer: Self-pay | Admitting: Hematology

## 2017-12-12 ENCOUNTER — Telehealth: Payer: Self-pay

## 2017-12-12 NOTE — Telephone Encounter (Signed)
Patient called said nurse advise to keep 5/1 appointment

## 2017-12-12 NOTE — Telephone Encounter (Signed)
Spoke to patient regarding VM she left earlier to r/s lab appointment.

## 2017-12-12 NOTE — Telephone Encounter (Signed)
Patient called inquiring when her MRI/CT scan is going to be scheduled (thought around the 2 year mark). Wants to know if this will be done soon or closer to her appointment in August.   Her [phone]

## 2017-12-13 ENCOUNTER — Inpatient Hospital Stay: Payer: 59 | Attending: Hematology

## 2017-12-13 ENCOUNTER — Other Ambulatory Visit: Payer: Self-pay | Admitting: Hematology

## 2017-12-13 ENCOUNTER — Other Ambulatory Visit: Payer: 59

## 2017-12-13 DIAGNOSIS — Z85048 Personal history of other malignant neoplasm of rectum, rectosigmoid junction, and anus: Secondary | ICD-10-CM | POA: Diagnosis present

## 2017-12-13 DIAGNOSIS — C20 Malignant neoplasm of rectum: Secondary | ICD-10-CM

## 2017-12-13 LAB — CBC WITH DIFFERENTIAL/PLATELET
Basophils Absolute: 0.1 10*3/uL (ref 0.0–0.1)
Basophils Relative: 1 %
Eosinophils Absolute: 0.1 10*3/uL (ref 0.0–0.5)
Eosinophils Relative: 3 %
HCT: 38.5 % (ref 34.8–46.6)
Hemoglobin: 12.7 g/dL (ref 11.6–15.9)
Lymphocytes Relative: 21 %
Lymphs Abs: 1 10*3/uL (ref 0.9–3.3)
MCH: 29.5 pg (ref 25.1–34.0)
MCHC: 33.1 g/dL (ref 31.5–36.0)
MCV: 89 fL (ref 79.5–101.0)
Monocytes Absolute: 0.3 10*3/uL (ref 0.1–0.9)
Monocytes Relative: 6 %
Neutro Abs: 3.1 10*3/uL (ref 1.5–6.5)
Neutrophils Relative %: 69 %
Platelets: 238 10*3/uL (ref 145–400)
RBC: 4.33 MIL/uL (ref 3.70–5.45)
RDW: 14.1 % (ref 11.2–14.5)
WBC: 4.6 10*3/uL (ref 3.9–10.3)

## 2017-12-13 LAB — COMPREHENSIVE METABOLIC PANEL
ALT: 11 U/L (ref 0–55)
AST: 18 U/L (ref 5–34)
Albumin: 4.2 g/dL (ref 3.5–5.0)
Alkaline Phosphatase: 88 U/L (ref 40–150)
Anion gap: 9 (ref 3–11)
BUN: 11 mg/dL (ref 7–26)
CO2: 27 mmol/L (ref 22–29)
Calcium: 9.6 mg/dL (ref 8.4–10.4)
Chloride: 106 mmol/L (ref 98–109)
Creatinine, Ser: 0.83 mg/dL (ref 0.60–1.10)
GFR calc Af Amer: >60 mL/min (ref >60)
GFR calc non Af Amer: >60 mL/min (ref >60)
Glucose, Bld: 95 mg/dL (ref 70–140)
Potassium: 4.2 mmol/L (ref 3.5–5.1)
Sodium: 142 mmol/L (ref 136–145)
Total Bilirubin: 0.7 mg/dL (ref 0.2–1.2)
Total Protein: 7.5 g/dL (ref 6.4–8.3)

## 2017-12-13 NOTE — Telephone Encounter (Signed)
Her CT chest and MRI abd/pel are supposed to be done in early May before her visit with Dr. Marcello Moores. I have ordered it.  Tia, could you get pre-auth ASAP? Thanks much.   Truitt Merle MD

## 2017-12-13 NOTE — Telephone Encounter (Signed)
This RN called pt and informed her per Dr Ernestina Penna last visit note - scans are to be repeated prior to visit in August. Per visit pt is to have CT of chest and MRI of abdomen.  Per call pt states no new symptoms occurring " just the same minimal drainage from my navel but nothing new "   Noted no orders for scans in system - will forward note to Dr Burr Medico for appropriate orders for scans.

## 2017-12-14 LAB — CEA (IN HOUSE-CHCC): CEA (CHCC-In House): 1.02 ng/mL (ref 0.00–5.00)

## 2017-12-14 NOTE — Telephone Encounter (Signed)
CT and MRIs have now approved.

## 2017-12-15 ENCOUNTER — Telehealth: Payer: Self-pay | Admitting: *Deleted

## 2017-12-15 NOTE — Telephone Encounter (Signed)
Pt left vm asking about recent lab results.  Returned call & informed that labs were good & tumor marker was normal & OK to call back with any questions.

## 2017-12-18 ENCOUNTER — Telehealth: Payer: Self-pay

## 2017-12-18 ENCOUNTER — Other Ambulatory Visit: Payer: Self-pay

## 2017-12-18 DIAGNOSIS — C20 Malignant neoplasm of rectum: Secondary | ICD-10-CM

## 2017-12-18 NOTE — Telephone Encounter (Signed)
-----   Message from Truitt Merle, MD sent at 12/16/2017 10:22 PM EDT ----- Please let pt know her lab results, no concerns, thanks  Truitt Merle  12/16/2017

## 2017-12-18 NOTE — Telephone Encounter (Signed)
[facility] called stating that when they called patient to schedule her scans that she said she would prefer to have them done at [place] because she is claustrophobic.

## 2017-12-18 NOTE — Telephone Encounter (Signed)
Notified patient per Dr. Burr Medico lab results came back and there are no concerns.  Patient verbalized an understanding.

## 2017-12-19 ENCOUNTER — Telehealth: Payer: Self-pay

## 2017-12-19 NOTE — Telephone Encounter (Signed)
I was told it needed to be changed to with and without.  Malachy Mood

## 2017-12-19 NOTE — Telephone Encounter (Signed)
Called [employer] and changed cpt code from [phone] to [phone].

## 2017-12-19 NOTE — Telephone Encounter (Signed)
-----   Message from Carolan Clines sent at 12/19/2017 10:34 AM EDT ----- Regarding: Order Hi ladies,  For the MRI Pelvis, which order is correct? Dr. Burr Medico placed order for WITH CONTRAST and a few days later a order for WITH AND WITHOUT was placed. Please let me know which is correct.   Thanks, Tia

## 2017-12-28 ENCOUNTER — Other Ambulatory Visit: Payer: 59

## 2018-01-02 ENCOUNTER — Other Ambulatory Visit: Payer: Self-pay

## 2018-01-02 DIAGNOSIS — C20 Malignant neoplasm of rectum: Secondary | ICD-10-CM

## 2018-01-03 ENCOUNTER — Ambulatory Visit
Admission: RE | Admit: 2018-01-03 | Discharge: 2018-01-03 | Disposition: A | Discharge status: Home / Self Care | Payer: 59 | Source: Ambulatory Visit | Attending: Hematology | Admitting: Hematology

## 2018-01-03 ENCOUNTER — Other Ambulatory Visit: Payer: 59

## 2018-01-03 DIAGNOSIS — C20 Malignant neoplasm of rectum: Secondary | ICD-10-CM

## 2018-01-03 MED ORDER — GADOBENATE DIMEGLUMINE 529 MG/ML IV SOLN
18.0000 mL | Freq: Once | INTRAVENOUS | Status: AC | PRN
  Administered 2018-01-03: 18 mL via INTRAVENOUS

## 2018-01-05 ENCOUNTER — Telehealth: Payer: Self-pay | Admitting: *Deleted

## 2018-01-05 NOTE — Telephone Encounter (Signed)
Pt called wanting to know results of MRI and CT scan done 01/03/18. Pt's    Phone     [phone].

## 2018-01-05 NOTE — Telephone Encounter (Signed)
Spoke with pt and informed her of MRI and CT Chest results - Negative - as per Dr. Ernestina Penna instructions.   Pt stated she had seen the surgeon Dr. Marcello Moores a month ago.  Pt aware of follow up appt with Dr. Burr Medico on  03/15/18. Pt appreciated nurse call.

## 2018-03-08 NOTE — Progress Notes (Signed)
[place]  [phone] [phone]  Clinic follow Up Note   Patient Care Team: Joretta Bachelor, [place] as PCP - General (Physician Assistant) Truitt Merle, MD as Consulting Physician (Hematology) Kyung Rudd, MD as Consulting Physician (Radiation Oncology) Jackolyn Confer, MD as Consulting Physician (General Surgery) Richmond Campbell, MD as Consulting Physician (Gastroenterology) Milus Banister, MD as Attending Physician (Gastroenterology) Tania Ade, RN as Registered Nurse Leighton Ruff, MD as Consulting Physician (General Surgery) 03/15/2018   CHIEF COMPLAINTS:  Follow up rectal cancer  Oncology History   Rectal cancer [facility])   Staging form: Colon and Rectum, AJCC 7th Edition     Clinical stage from 12/03/2015: Stage IIIB (T3, N1, M0) - Signed by Truitt Merle, MD on 12/18/2015       Rectal cancer ([place])   12/03/2015 Initial Diagnosis    Rectal cancer ([place])      12/03/2015 Procedure    Colonoscopy showed a partially obstructing tumor in the proximal rectum, its diameter measured 6 mm, biopsed      12/03/2015 Initial Biopsy    Rectum mass biopsy showed adenocarcinoma, moderately differentiated      12/10/2015 Imaging    CT abdomen and pelvis with contrast showed luminal narrowing and mural thickening in the proximal to mid rectum. There is a 6 mm short axis mesorectal lymph node suspicious for metastasis. There are several tiny indeterminate liver lesions       12/17/2015 Imaging    MRI pelvis with and without contrast showed a advanced T3 rectal adenocarcinoma (7cm), N1 disease (at least 2 left-sided nasal rectal lymph nodes are seen measuring 6-7 mm), distance from tumor to the sphincter is 8 cm      12/17/2015 Imaging    CT chest with contrast showed no evidence of metastasis or other changes      12/30/2015 - 02/08/2016 Radiation Therapy    neoadjuvant radiation to rectal cancer       12/30/2015 - 02/08/2016 Chemotherapy    xeloda  1538m twice daily with concurent radiation, she tolerated well       03/15/2016 Imaging    CT AP IMPRESSION: 1. No acute findings within the abdomen or pelvis. No evidence for metastatic disease. 2. Mild wall thickening involving the distal sigmoid colon and rectum is identified. Nonspecific and may reflect sequelae of external beam radiation.      03/21/2016 Surgery    Rectosigmoid segmental resection       03/21/2016 Pathology Results    Invasive well differentiated adenocarcinoma, 5cm,  margins are negative, LVI (-), 1 out of 27 nodes were negative.        05/05/2016 - 05/05/2016 Adjuvant Chemotherapy    Adjuvant CAPOX, she had facial numbness and SOB after she completed the first oxaliplatin infusion, and refused more oxaliplatin       12/12/2016 Imaging    CT AP IMPRESSION: 1. Status post resection of rectal carcinoma. No complications identified and no specific features identified to suggest residual/recurrence of tumor along the suture line. 2. Stable tiny low-attenuation foci within the liver. This is too small to characterize. 3. No specific findings identified to suggest metastatic disease.      01/04/2018 Imaging    01/04/2018 CT Chest WO Contrast IMPRESSION: 1. Stable chest CT.  No findings of metastatic disease identified.  01/04/2018 MRI AP WO Contrast IMPRESSION: 1. Surgical changes within the high rectum. No evidence of metastatic disease. Mildly asymmetric soft tissue thickening in this area is likely treatment  related. No evidence of locally recurrent disease on the colonoscopy of 2018. 2.  Aortic Atherosclerosis (ICD10-I70.0). 3. Mildly heterogeneous enhancement involving the sacrum is most likely related to prior radiation therapy       HISTORY OF PRESENTING ILLNESS:  Kaitlyn Morgan 52 y.o. female is here because of her newly diagnosed rectal cancer. She is accompanied by her daughter and her mother to our multidisciplinary GI clinic today.  She has had  chronic abdominal pain for 2-3 years, in the epigastric area and low abdomen, which has been worse lately, and she noticed constipation, abdominal bloating and mild rectal bleeding for 2 months. She was referred to Dr. Earlean Shawl and underwent colonoscopy on 12/03/2015. A partial obstructive rectal mass was found in the proximal rectum, biopsy showed adenocarcinoma. She was referred to surgeon Dr. Zella Richer who referred her to [place] to consider new adjuvant chemoradiation.  She feels well overall, has mild fatigue, but able to tolerate her routine full-time job in daily activities without difficulties. She has good appetite, but eats less due to the epigastric pain after meal. She denies any other significant pain, cough, or other symptoms. No recent weight loss. She has been on phentermine for weight loss recently, and stopped a few days ago.  CURRENT THERAPY:  Surveillance  INTERIM HISTORY:  Kaitlyn Morgan returns for follow up. She present to the clinic today alone. She is complaining of swelling in her LLs and abdomen. She was not able to walk, wear closed shoes, or sleep flat at night. She also complains of difficulty urinating. She describes it at frequency at night with clear urine. No dysuria. She reports headaches as well. She tried ginger and turmeric for her headaches. She is not able to eat due to throat pain and SOB. She tries to read about her symptoms online. She also tries to go to the gym and workout to stay fit and sweat as she thinks sweating might help relief her symptoms.   She complains of loose stools and frequency with fecal incontinence. She also started having urinary incontinence after radiation. She is anxious about having lymphedema because a co-worker had similar cancer and developed lymphedema. Her job requires long hours of sitting, and she reports feeling better when she is moving and not sitting. She wears compression staking at work. She's currently looking for a PCP. She  saw Dr. Marcello Moores earlier this year.  MEDICAL HISTORY:  Past Medical History:  Diagnosis Date  . Anemia    during pregnancy, not now.  . 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 Atypical syncope    1 episode -never had follow up with cardiology-never any further episodes, did go to urgent care.  . Colon cancer [facility])    rectal cancer Radiation and oral chemo with radiation.  . Complication of anesthesia   . Family history of breast cancer   . Family history of uterine cancer   . Genital lesion, female    at times  . Heart burn    occ.  Marland Kitchen Hx of seasonal allergies   . Motion sickness   . PONV (postoperative nausea and vomiting)   . Rectal cancer ([place])   . Umbilicus discharge     SURGICAL HISTORY: Past Surgical History:  Procedure Laterality Date  . 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 CESAREAN SECTION    . COLON SURGERY    . DIAGNOSTIC LAPAROSCOPY     with D and C  and Ovarian cystectomy, tubal ligation done at this time '00  . DILATION AND CURETTAGE OF UTERUS    . TUBAL LIGATION    . XI ROBOTIC ASSISTED LOWER ANTERIOR RESECTION N/A 03/21/2016   Procedure: XI ROBOTIC ASSISTED LOWER ANTERIOR RESECTION;  Surgeon: Leighton Ruff, MD;  Location: [facility];  Service: General;  Laterality: N/A;    SOCIAL HISTORY: Social History   Socioeconomic History  . Marital status: Legally Separated    Spouse name: Not on file  . Number of children: 3  . Years of education: Not on file  . Highest education level: Not on file  Occupational History  . Not on file  Social Needs  . Financial resource strain: Not on file  . Food insecurity:    Worry: Not on file    Inability: Not on file  . Transportation needs:    Medical: Not on file    Non-medical: Not on file  Tobacco Use  . Smoking status: Never Smoker  . Smokeless tobacco: Never Used  Substance and Sexual Activity  . Alcohol use: No    Alcohol/week: 0.0 oz  . Drug use:  No  . Sexual activity: Not on file  Lifestyle  . Physical activity:    Days per week: Not on file    Minutes per session: Not on file  . Stress: Not on file  Relationships  . Social connections:    Talks on phone: Not on file    Gets together: Not on file    Attends religious service: Not on file    Active member of club or organization: Not on file    Attends meetings of clubs or organizations: Not on file    Relationship status: Not on file  . Intimate partner violence:    Fear of current or ex partner: Not on file    Emotionally abused: Not on file    Physically abused: Not on file    Forced sexual activity: Not on file  Other Topics Concern  . Not on file  Social History Narrative  . Not on file   She has 3 children, she lives with her twin sons who are 29. She works for [employer]   FAMILY HISTORY: Family History  Problem Relation Age of Onset  . Breast cancer Maternal Grandmother        dx in her 60s  . Cancer Paternal Grandmother 42       uterine cancer   . Hypertension Father   . COPD Father   . Hypertension Sister   . Hypertension Paternal Grandfather   . Heart attack Paternal Grandfather   . Hypertension Sister   . Skin cancer Cousin        maternal first cousin    ALLERGIES:  is allergic to codeine; morphine and related; sucralose; and zolpidem tartrate.  MEDICATIONS:  Current Outpatient Medications  Medication Sig Dispense Refill  . estradiol (ESTRACE) 2 MG tablet Take 2 mg by mouth daily.  0  . Multiple Vitamins-Minerals (ADULT GUMMY) CHEW Chew 4-6 capsules by mouth daily.    . NON FORMULARY Take 21 drops by mouth 2 (two) times daily. Ginger Roots solution    . phentermine 37.5 MG capsule TK 1 C PO ONCE D IN THE MORNING  2  . TURMERIC PO Take 1 capsule by mouth daily as needed.    . polyethylene glycol (MIRALAX / GLYCOLAX) packet Take 17 g by mouth daily as needed for moderate constipation.  No current facility-administered medications  for this visit.      REVIEW OF SYSTEMS:   Constitutional: Denies fevers, abnormal night sweats  Eyes: Denies blurriness of vision, double vision or watery eyes Ears, nose, mouth, throat, and face: Denies mucositis or sore throat Respiratory: Denies cough, dyspnea or wheezes Cardiovascular: Denies palpitation, chest discomfort (+) lower extremity swelling  Gastrointestinal:  Denies vomiting, heartburn, or blood in stool. (+) Mild right upper quadrant tenderness and swelling(+) Irregular bowel pattern, with fecal incontinence  (+) bloating  GU: (+) Urinary incontinence, mainly with urge Skin: Denies abnormal skin rashes Lymphatics: Denies new lymphadenopathy or easy bruising Neurological:Denies numbness, tingling or new weaknesses Musculoskeletal: Denies joint aches or pains. Behavioral/Psych: Mood is stable, no new changes  (+) anxiety and depression All other systems were reviewed with the patient and are negative.   PHYSICAL EXAMINATION: ECOG PERFORMANCE STATUS: 1 - Symptomatic but completely ambulatory  Vitals:   03/15/18 1545  BP: 124/82  Pulse: 72  Resp: 18  Temp: 98.2 F (36.8 C)  SpO2: 100%   Filed Weights   03/15/18 1545  Weight: 197 lb 8 oz (89.6 kg)   GENERAL:alert, no distress and comfortable SKIN: skin color, texture, turgor are normal, no rashes or significant lesions EYES: normal, conjunctiva are pink and non-injected, sclera clear OROPHARYNX:no exudate, no erythema and lips, buccal mucosa, and tongue normal  NECK: supple, thyroid normal size, non-tender, without nodularity LYMPH:  no palpable Cervical or supraclavicular lymphadenopathy  LUNGS: clear to auscultation bilaterally, normal breathing effort HEART: regular rate & rhythm, S1 and S2 present, no murmurs, no lower extremity edema ABDOMEN: abdomen soft, round; normal bowel sounds throughout. (+) Mild tenderness. Incision site well healed.  MSK:no cyanosis of digits and no clubbing (+) bilateral LL  edema PSYCH: alert & oriented x 3 with fluent speech (+) anxious NEURO: no focal motor/sensory deficits RECTAL: Declined today    LABORATORY DATA:  I have reviewed the data as listed CBC Latest Ref Rng & Units 03/15/2018 12/13/2017 09/15/2017  WBC 3.9 - 10.3 K/uL 3.5(L) 4.6 3.0(L)  Hemoglobin 11.6 - 15.9 g/dL 12.8 12.7 12.6  Hematocrit 34.8 - 46.6 % 38.1 38.5 37.8  Platelets 145 - 400 K/uL 212 238 236    CMP Latest Ref Rng & Units 03/15/2018 12/13/2017 09/15/2017  Glucose 70 - 99 mg/dL 83 95 76  BUN 6 - 20 mg/dL _0 Creatinine 0.44 - 1.00 mg/dL 0.82 0.83 0.81  Sodium 135 - 145 mmol/L 143 142 140  Potassium 3.5 - 5.1 mmol/L 4.2 4.2 4.0  Chloride 98 - 111 mmol/L 106 106 106  CO2 22 - 32 mmol/L _1 Calcium 8.9 - 10.3 mg/dL 9.4 9.6 8.9  Total Protein 6.5 - 8.1 g/dL 7.2 7.5 7.3  Total Bilirubin 0.3 - 1.2 mg/dL 0.7 0.7 1.1  Alkaline Phos 38 - 126 U/L 86 88 98  AST 15 - 41 U/L _2 ALT 0 - 44 U/L _3 CEA 12/18/2015: 0.9 04/27/2016: 0.6 09/13/2016: 0.8 Results for DATRA, CLARY (MRN [id number]) as of 03/08/2018 17:27  Ref. Range 04/27/2016 08:45 09/13/2016 15:20 04/19/2017 09:35 09/15/2017 12:42 12/13/2017 16:19  CEA Latest Ref Range: 0.0 - 4.7 ng/mL 0.6 0.8     CEA ([facility]) Latest Ref Range: 0.00 - 5.00 ng/mL <1.00 1.08 <1.00 <1.00 1.02    PATHOLOGY REPORT Diagnosis 12/03/2015 Colon, sigmoid, mass, biopsy -Invasive adenocarcinoma, moderately differentiated. See comment.  Comment: due to the presence of adenocarcinoma,  IHC for DNA mismatch repair proteins will be performed and reported in an addendum.  Diagnosis 03/21/2016 1. Colon, segmental resection for tumor, rectosigmoid - INVASIVE WELL DIFFERENTIATED ADENOCARCINOMA, SPANNING 5 CM IN GREATEST DIMENSION. - TUMOR INVADES THROUGH MUSCULARIS PROPRIA TO INVOLVE SUBSEROSAL SOFT TISSUES. - MARGINS ARE NEGATIVE. - ONE OF TWENTY-SEVEN LYMPH NODES IS POSITIVE FOR METASTATIC ADENOCARCINOMA (1/27). - SEE ONCOLOGY  TEMPLATE. 2. Colon, resection margin (donut), distal rectal, final margin - BENIGN COLORECTAL MUCOSA. - NO TUMOR SEEN. Microscopic Comment 1. COLON AND RECTUM (INCLUDING TRANS-ANAL RESECTION): Specimen: Rectosigmoid colon. Procedure: Robotic lower anterior resection. Tumor site: Rectum. Specimen integrity: Intact. Macroscopic intactness of mesorectum: Complete. Macroscopic tumor perforation: Not identified. Invasive tumor: Maximum size: 5 cm. Histologic type(s): Adenocarcinoma. Histologic grade and differentiation: G1: well differentiated/low grade. Type of polyp in which invasive carcinoma arose: Tubular adenoma with high grade dysplasia. Microscopic extension of invasive tumor: Tumor invades through muscularis propria to involve subserosal soft tissues. Lymph-Vascular invasion: Although definitive lymph/vascular invasion is not identified, there is lymph node positivity (see below). Peri-neural invasion: None identified. Tumor deposit(s) (discontinuous extramural extension): None identified. Resection margins: Proximal margin: Negative. Distal margin: Negative. Circumferential (radial) (posterior ascending, posterior descending; lateral and posterior mid-rectum; and entire lower 1/3 rectum): Negative. Mesenteric margin: Not applicable. Distance closest margin (if all above margins negative): .2.4 cm (distal margin) Trans-anal resection margins only: Not applicable. Deep margin: Not applicable. Mucosal Margin: Not applicable. Distance closest mucosal margin (if negative): Not applicable. Treatment effect (neo-adjuvant therapy): Minimal treatment effect is present in the primary tumor. Minimal treatment effect is also present in the positive lymph nodes with the other lymph nodes showing no degree of treatment effect. Additional polyp(s): No additional polyps identified. Non-neoplastic findings: Diverticulosis. Lymph nodes: number examined 27; number positive: 1. Pathologic  Staging: ypT3, ypN1a. Ancillary studies: As the patient is status-post neoadjuvant therapy, MSI by PCR and MMR by [name] will not be sent.   RADIOGRAPHIC STUDIES: I have personally reviewed the radiological images as listed and agreed with the findings in the report.  01/04/2018 CT Chest WO Contrast IMPRESSION: 1. Stable chest CT.  No findings of metastatic disease identified.  01/04/2018 MRI AP WO Contrast IMPRESSION: 1. Surgical changes within the high rectum. No evidence of metastatic disease. Mildly asymmetric soft tissue thickening in this area is likely treatment related. No evidence of locally recurrent disease on the colonoscopy of 2018. 2.  Aortic Atherosclerosis (ICD10-I70.0). 3. Mildly heterogeneous enhancement involving the sacrum is most likely related to prior radiation therapy  CT AP 12/12/16 IMPRESSION: 1. Status post resection of rectal carcinoma. No complications identified and no specific features identified to suggest residual/recurrence of tumor along the suture line. 2. Stable tiny low-attenuation foci within the liver. This is too small to characterize. 3. No specific findings identified to suggest metastatic disease.  COLONOSCOPY DR. MEDOFF 12/03/2015 Impression Malignant partially obstructing tumor in the proximal rectum, its diameter measured 6 mm, biopsy. Distal margin at 15 cm.    ASSESSMENT & PLAN:   51 y.o. Caucasian female, without significant past medical history, presented with abdominal pain and rectal bleeding. Colonoscopy showed a obstructing tumor in the proximal rectum.  1. Rectal adenocarcinoma, proximal rectum, cT3N1M0, ypT3N1aM0, stage IIIB, moderately differentiated --I previously reviewed her surgical path findings with pt in details -she has significant residual disease after neoadjuvant chemo and radiaiton -I previously discussed the high risk of cancer recurrence after surgery, I recommend adjuvant chemo -we previously discussed the option  of FOLFOX and CAPOX. She opted  CAPOX  -She has some neurological symptoms after first oxaliplatin infusion, and refuses to try again with premedication. She states she is not going to regret even if she had cancer recurrence. Chemo was held.  -I encouraged her to consider single agent Xeloda, which she took with radiation before. She declined. -I previously reveiwed CT scan from 12/12/2016 results with her, which showed no evidence of recurrence. -She is clinically doing well overall, but she complains of body swelling and intermittent dyspnea on exertion. Labs reviewed which are unremarkable.  Her exam was normal.  No clinical concern for recurrence. -I reviewed her CT of chest, abdominal and pelvic MRI which was done in May 2019, showed no evidence of recurrence. -Due to her swelling and dyspnea, and a prior history of chemotherapy, I will obtain an echocardiogram to rule out heart failure, so my suspicion is not high. -She is due colonoscopy with Dr. Earlean Shawl soon.  I encouraged her to call. -She lost her previous primary care physician, I encouraged her to find a new one.  I will give her the number of [place] [facility] to call for appointment  -Lab and f/u in 6 months, she will see Dr. Marcello Moores in the interim   2. Fatigue, leg edema and mild intermittent dyspnea -Nonspecific, significant leg edema on today's exam -I have strongly encouraged her to gradually increase her activity level, and exercise regularly, to get her strength back. -I encouraged her to watch her diet, and try to lose some weight. -Encouraged her to follow-up with her primary care physician. -I will get an ECHO   3. Genetics -she was seen by genetic counselor, and her genetic test was negative for lynch syndrome or the following 32 genes: APC, ATM, AXIN2, BARD1, BMPR1A, BRCA1, BRCA2, BRIP1, CDH1, CDK4, CDKN2A, CHEK2, EPCAM, FANCC, MLH1, MSH2, MSH6, MUTYH, NBN, PALB2, PMS2, POLD1, POLE, PTEN, RAD51C, RAD51D, SCG5/GREM1,  SMAD4, STK11, TP53, VHL, and XRCC2.    4. Anxiety/Depression -She has high stress due to family issues -headaches may be related -I referred her to our social worker to start talking with someone.   5.  Peripheral neuropathy G1 -Continue chemotherapy, mild and stable.  6. Fecal and urinary incontinence - Started mainly after radiation and come with urgency -She is coping well. She wears daily pads and prefers not to have PR exam to prevent leakage.  Plan: -She is due colonoscopy with Dr. Earlean Shawl soon. She will call for appointment  -I encouraged her to find a new primary care physician -Lab and f/u in 6 months  -Echo in 2-4 weeks   All questions were answered. The patient knows to call the clinic with any problems, questions or concerns.  I spent 25 minutes counseling the patient face to face. The total time spent in the appointment was 30 minutes and more than 50% was on counseling.  Dierdre Searles Dweik am acting as scribe for Dr. Truitt Merle.  I have reviewed the above documentation for accuracy and completeness, and I agree with the above.     Truitt Merle, MD 03/15/2018

## 2018-03-15 ENCOUNTER — Inpatient Hospital Stay: Payer: 59 | Attending: Hematology | Admitting: Hematology

## 2018-03-15 ENCOUNTER — Inpatient Hospital Stay: Payer: 59

## 2018-03-15 ENCOUNTER — Telehealth: Payer: Self-pay | Admitting: Hematology

## 2018-03-15 ENCOUNTER — Encounter: Payer: Self-pay | Admitting: Hematology

## 2018-03-15 VITALS — BP 124/82 | HR 72 | Temp 98.2°F | Resp 18 | Ht 63.0 in | Wt 197.5 lb

## 2018-03-15 DIAGNOSIS — R0600 Dyspnea, unspecified: Secondary | ICD-10-CM | POA: Diagnosis not present

## 2018-03-15 DIAGNOSIS — R5383 Other fatigue: Secondary | ICD-10-CM | POA: Diagnosis not present

## 2018-03-15 DIAGNOSIS — C20 Malignant neoplasm of rectum: Secondary | ICD-10-CM

## 2018-03-15 DIAGNOSIS — N3941 Urge incontinence: Secondary | ICD-10-CM

## 2018-03-15 DIAGNOSIS — Z9221 Personal history of antineoplastic chemotherapy: Secondary | ICD-10-CM | POA: Diagnosis not present

## 2018-03-15 DIAGNOSIS — R159 Full incontinence of feces: Secondary | ICD-10-CM | POA: Insufficient documentation

## 2018-03-15 DIAGNOSIS — Z85048 Personal history of other malignant neoplasm of rectum, rectosigmoid junction, and anus: Secondary | ICD-10-CM | POA: Insufficient documentation

## 2018-03-15 DIAGNOSIS — Z923 Personal history of irradiation: Secondary | ICD-10-CM | POA: Diagnosis not present

## 2018-03-15 DIAGNOSIS — F418 Other specified anxiety disorders: Secondary | ICD-10-CM | POA: Insufficient documentation

## 2018-03-15 DIAGNOSIS — R609 Edema, unspecified: Secondary | ICD-10-CM | POA: Diagnosis not present

## 2018-03-15 DIAGNOSIS — R600 Localized edema: Secondary | ICD-10-CM | POA: Insufficient documentation

## 2018-03-15 DIAGNOSIS — G620 Drug-induced polyneuropathy: Secondary | ICD-10-CM | POA: Diagnosis not present

## 2018-03-15 DIAGNOSIS — R51 Headache: Secondary | ICD-10-CM

## 2018-03-15 LAB — COMPREHENSIVE METABOLIC PANEL
ALT: 9 U/L (ref 0–44)
AST: 17 U/L (ref 15–41)
Albumin: 4.2 g/dL (ref 3.5–5.0)
Alkaline Phosphatase: 86 U/L (ref 38–126)
Anion gap: 7 (ref 5–15)
BUN: 14 mg/dL (ref 6–20)
CO2: 30 mmol/L (ref 22–32)
Calcium: 9.4 mg/dL (ref 8.9–10.3)
Chloride: 106 mmol/L (ref 98–111)
Creatinine, Ser: 0.82 mg/dL (ref 0.44–1.00)
GFR calc Af Amer: >60 mL/min (ref >60)
GFR calc non Af Amer: >60 mL/min (ref >60)
Glucose, Bld: 83 mg/dL (ref 70–99)
Potassium: 4.2 mmol/L (ref 3.5–5.1)
Sodium: 143 mmol/L (ref 135–145)
Total Bilirubin: 0.7 mg/dL (ref 0.3–1.2)
Total Protein: 7.2 g/dL (ref 6.5–8.1)

## 2018-03-15 LAB — CBC WITH DIFFERENTIAL/PLATELET
Basophils Absolute: 0 10*3/uL (ref 0.0–0.1)
Basophils Relative: 1 %
Eosinophils Absolute: 0.1 10*3/uL (ref 0.0–0.5)
Eosinophils Relative: 2 %
HCT: 38.1 % (ref 34.8–46.6)
Hemoglobin: 12.8 g/dL (ref 11.6–15.9)
Lymphocytes Relative: 28 %
Lymphs Abs: 1 10*3/uL (ref 0.9–3.3)
MCH: 29.8 pg (ref 25.1–34.0)
MCHC: 33.6 g/dL (ref 31.5–36.0)
MCV: 88.6 fL (ref 79.5–101.0)
Monocytes Absolute: 0.3 10*3/uL (ref 0.1–0.9)
Monocytes Relative: 9 %
Neutro Abs: 2.1 10*3/uL (ref 1.5–6.5)
Neutrophils Relative %: 60 %
Platelets: 212 10*3/uL (ref 145–400)
RBC: 4.3 MIL/uL (ref 3.70–5.45)
RDW: 13.8 % (ref 11.2–14.5)
WBC: 3.5 10*3/uL — ABNORMAL LOW (ref 3.9–10.3)

## 2018-03-15 NOTE — Telephone Encounter (Signed)
Appt scheduled AVS/ Calendar printed per 8/1 los

## 2018-03-16 LAB — CEA (IN HOUSE-CHCC): CEA (CHCC-In House): 1.07 ng/mL (ref 0.00–5.00)

## 2018-03-19 ENCOUNTER — Telehealth: Payer: Self-pay

## 2018-03-19 NOTE — Telephone Encounter (Signed)
Per Dr. Burr Medico notified patient CEA was normal, she verbalized an understanding.

## 2018-03-19 NOTE — Telephone Encounter (Signed)
-----   Message from Truitt Merle, MD sent at 03/17/2018  2:04 PM EDT ----- Please let pt know her CEA was normal. No concerns, thanks  Truitt Merle  03/17/2018

## 2018-04-23 ENCOUNTER — Other Ambulatory Visit (HOSPITAL_COMMUNITY): Payer: 59

## 2018-04-27 ENCOUNTER — Ambulatory Visit (HOSPITAL_COMMUNITY)
Admission: RE | Admit: 2018-04-27 | Discharge: 2018-04-27 | Disposition: A | Discharge status: Home / Self Care | Payer: 59 | Source: Ambulatory Visit | Attending: Hematology | Admitting: Hematology

## 2018-04-27 DIAGNOSIS — R0600 Dyspnea, unspecified: Secondary | ICD-10-CM | POA: Insufficient documentation

## 2018-04-27 DIAGNOSIS — C20 Malignant neoplasm of rectum: Secondary | ICD-10-CM | POA: Insufficient documentation

## 2018-04-27 DIAGNOSIS — R600 Localized edema: Secondary | ICD-10-CM | POA: Diagnosis not present

## 2018-04-27 DIAGNOSIS — Z9221 Personal history of antineoplastic chemotherapy: Secondary | ICD-10-CM | POA: Insufficient documentation

## 2018-04-27 NOTE — Progress Notes (Signed)
  Echocardiogram 2D Echocardiogram has been performed.  Kaitlyn Morgan F 04/27/2018, 12:13 PM

## 2018-05-01 ENCOUNTER — Telehealth: Payer: Self-pay

## 2018-05-01 NOTE — Telephone Encounter (Signed)
Spoke with patient per Dr. Burr Medico notified her echo was normal, no concerns.

## 2018-05-01 NOTE — Telephone Encounter (Signed)
-----   Message from Truitt Merle, MD sent at 04/28/2018 10:13 AM EDT ----- Please make sure pt know her echo results, WNL, no concerns, thanks   Truitt Merle  04/28/2018

## 2018-05-24 ENCOUNTER — Other Ambulatory Visit: Payer: Self-pay | Admitting: Nurse Practitioner

## 2018-08-31 ENCOUNTER — Ambulatory Visit: Payer: 59 | Admitting: Family Medicine

## 2018-09-14 ENCOUNTER — Other Ambulatory Visit: Payer: Self-pay

## 2018-09-14 DIAGNOSIS — C20 Malignant neoplasm of rectum: Secondary | ICD-10-CM

## 2018-09-17 ENCOUNTER — Inpatient Hospital Stay: Payer: 59 | Admitting: Hematology

## 2018-09-17 ENCOUNTER — Telehealth: Payer: Self-pay | Admitting: Hematology

## 2018-09-17 ENCOUNTER — Inpatient Hospital Stay: Payer: 59

## 2018-09-17 NOTE — Telephone Encounter (Signed)
R/s appt per pt request per 2/3 sch message - pt is aware of appt date and time

## 2018-09-25 NOTE — Progress Notes (Signed)
[name]   [phone] [phone]   Clinic Follow up Note   Patient Care Team: Patient, No Pcp Per as PCP - General ([place]) Truitt Merle, MD as Consulting Physician (Hematology) Kyung Rudd, MD as Consulting Physician (Radiation Oncology) Jackolyn Confer, MD as Consulting Physician (General Surgery) Richmond Campbell, MD as Consulting Physician (Gastroenterology) Milus Banister, MD as Attending Physician (Gastroenterology) Tania Ade, RN as Registered Nurse Leighton Ruff, MD as Consulting Physician (General Surgery) 09/27/2018  CHIEF COMPLAINT: F/u rectal cancer  SUMMARY OF ONCOLOGIC HISTORY: Oncology History   Rectal cancer [facility])   Staging form: Colon and Rectum, AJCC 7th Edition     Clinical stage from 12/03/2015: Stage IIIB (T3, N1, M0) - Signed by Truitt Merle, MD on 12/18/2015       Rectal cancer ([place])   12/03/2015 Initial Diagnosis    Rectal cancer ([place])    12/03/2015 Procedure    Colonoscopy showed a partially obstructing tumor in the proximal rectum, its diameter measured 6 mm, biopsed    12/03/2015 Initial Biopsy    Rectum mass biopsy showed adenocarcinoma, moderately differentiated    12/10/2015 Imaging    CT abdomen and pelvis with contrast showed luminal narrowing and mural thickening in the proximal to mid rectum. There is a 6 mm short axis mesorectal lymph node suspicious for metastasis. There are several tiny indeterminate liver lesions     12/17/2015 Imaging    MRI pelvis with and without contrast showed a advanced T3 rectal adenocarcinoma (7cm), N1 disease (at least 2 left-sided nasal rectal lymph nodes are seen measuring 6-7 mm), distance from tumor to the sphincter is 8 cm    12/17/2015 Imaging    CT chest with contrast showed no evidence of metastasis or other changes    12/30/2015 - 02/08/2016 Radiation Therapy    neoadjuvant radiation to rectal cancer     12/30/2015 - 02/08/2016 Chemotherapy    xeloda 1569m  twice daily with concurent radiation, she tolerated well     03/15/2016 Imaging    CT AP IMPRESSION: 1. No acute findings within the abdomen or pelvis. No evidence for metastatic disease. 2. Mild wall thickening involving the distal sigmoid colon and rectum is identified. Nonspecific and may reflect sequelae of external beam radiation.    03/21/2016 Surgery    Rectosigmoid segmental resection     03/21/2016 Pathology Results    Invasive well differentiated adenocarcinoma, 5cm,  margins are negative, LVI (-), 1 out of 27 nodes were negative.      05/05/2016 - 05/05/2016 Adjuvant Chemotherapy    Adjuvant CAPOX, she had facial numbness and SOB after she completed the first oxaliplatin infusion, and refused more oxaliplatin     12/12/2016 Imaging    CT AP IMPRESSION: 1. Status post resection of rectal carcinoma. No complications identified and no specific features identified to suggest residual/recurrence of tumor along the suture line. 2. Stable tiny low-attenuation foci within the liver. This is too small to characterize. 3. No specific findings identified to suggest metastatic disease.    01/04/2018 Imaging    01/04/2018 CT Chest WO Contrast IMPRESSION: 1. Stable chest CT.  No findings of metastatic disease identified.  01/04/2018 MRI AP WO Contrast IMPRESSION: 1. Surgical changes within the high rectum. No evidence of metastatic disease. Mildly asymmetric soft tissue thickening in this area is likely treatment related. No evidence of locally recurrent disease on the colonoscopy of 2018. 2.  Aortic Atherosclerosis (ICD10-I70.0). 3. Mildly heterogeneous enhancement involving the  sacrum is most likely related to prior radiation therapy     CURRENT THERAPY  Surveillance  INTERVAL HISTORY: Kaitlyn Morgan is a 53 y.o. female who is here for follow-up. She had an echocardiogram on 04/27/2018 and the results were WNL. Today, she is here alone and is doing well. This past winter she was  experiencing trouble breathing, had a headache, and sometimes felt like she couldn't walk. She is taking antibiotics and is feeling better now. She describes stomach bloating and denies stomach pain.   Pertinent positives and negatives of review of systems are listed and detailed within the above HPI.  REVIEW OF SYSTEMS:  Constitutional: Denies fevers, chills or abnormal weight loss Eyes: Denies blurriness of vision Ears, nose, mouth, throat, and face: Denies mucositis or sore throat Respiratory: Denies cough, dyspnea or wheezes Cardiovascular: Denies palpitation, chest discomfort or lower extremity swelling Gastrointestinal:  Denies nausea, heartburn or change in bowel habits, (+) stomach bloating  Skin: Denies abnormal skin rashes Lymphatics: Denies new lymphadenopathy or easy bruising Neurological:Denies numbness, tingling or new weaknesses Behavioral/Psych: Mood is stable, no new changes  All other systems were reviewed with the patient and are negative.  MEDICAL HISTORY:  Past Medical History:  Diagnosis Date  . Anemia    during pregnancy, not now.  . 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 Atypical syncope    1 episode -never had follow up with cardiology-never any further episodes, did go to urgent care.  . Colon cancer [facility])    rectal cancer Radiation and oral chemo with radiation.  . Complication of anesthesia   . Family history of breast cancer   . Family history of uterine cancer   . Genital lesion, female    at times  . Heart burn    occ.  Marland Kitchen Hx of seasonal allergies   . Motion sickness   . PONV (postoperative nausea and vomiting)   . Rectal cancer ([place])   . Umbilicus discharge     SURGICAL HISTORY: Past Surgical History:  Procedure Laterality Date  . 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 CESAREAN SECTION    . COLON SURGERY    . DIAGNOSTIC LAPAROSCOPY     with D and C and Ovarian cystectomy,  tubal ligation done at this time '00  . DILATION AND CURETTAGE OF UTERUS    . TUBAL LIGATION    . XI ROBOTIC ASSISTED LOWER ANTERIOR RESECTION N/A 03/21/2016   Procedure: XI ROBOTIC ASSISTED LOWER ANTERIOR RESECTION;  Surgeon: Leighton Ruff, MD;  Location: [facility];  Service: General;  Laterality: N/A;    I have reviewed the social history and family history with the patient and they are unchanged from previous note.  ALLERGIES:  is allergic to codeine; morphine and related; sucralose; and zolpidem tartrate.  MEDICATIONS:  Current Outpatient Medications  Medication Sig Dispense Refill  . estradiol (ESTRACE) 2 MG tablet Take 2 mg by mouth daily.  0  . Multiple Vitamins-Minerals (ADULT GUMMY) CHEW Chew 4-6 capsules by mouth daily.    . phentermine 37.5 MG capsule TK 1 C PO ONCE D IN THE MORNING  2  . NON FORMULARY Take 21 drops by mouth 2 (two) times daily. Ginger Roots solution    . polyethylene glycol (MIRALAX / GLYCOLAX) packet Take 17 g by mouth daily as needed for moderate constipation.    . TURMERIC PO Take 1 capsule by mouth daily as needed.  No current facility-administered medications for this visit.     PHYSICAL EXAMINATION: ECOG PERFORMANCE STATUS: 1 - Symptomatic but completely ambulatory  Vitals:   09/27/18 1534  BP: 119/85  Pulse: 73  Resp: 17  Temp: 98.5 F (36.9 C)  SpO2: 99%   Filed Weights   09/27/18 1534  Weight: 193 lb 11.2 oz (87.9 kg)   GENERAL:alert, no distress and comfortable SKIN: skin color, texture, turgor are normal, no rashes or significant lesions EYES: normal, Conjunctiva are pink and non-injected, sclera clear OROPHARYNX:no exudate, no erythema and lips, buccal mucosa, and tongue normal  NECK: supple, thyroid normal size, non-tender, without nodularity LYMPH:  no palpable lymphadenopathy in the cervical, axillary or inguinal LUNGS: clear to auscultation and percussion with normal breathing effort HEART: regular rate & rhythm and no murmurs  and no lower extremity edema ABDOMEN:abdomen soft, non-tender and normal bowel sounds Musculoskeletal:no cyanosis of digits and no clubbing  NEURO: alert & oriented x 3 with fluent speech, no focal motor/sensory deficits  LABORATORY DATA:  I have reviewed the data as listed CBC Latest Ref Rng & Units 03/15/2018 12/13/2017 09/15/2017  WBC 3.9 - 10.3 K/uL 3.5(L) 4.6 3.0(L)  Hemoglobin 11.6 - 15.9 g/dL 12.8 12.7 12.6  Hematocrit 34.8 - 46.6 % 38.1 38.5 37.8  Platelets 145 - 400 K/uL 212 238 236     CMP Latest Ref Rng & Units 03/15/2018 12/13/2017 09/15/2017  Glucose 70 - 99 mg/dL 83 95 76  BUN 6 - 20 mg/dL '14 11 9  ' Creatinine 0.44 - 1.00 mg/dL 0.82 0.83 0.81  Sodium 135 - 145 mmol/L 143 142 140  Potassium 3.5 - 5.1 mmol/L 4.2 4.2 4.0  Chloride 98 - 111 mmol/L 106 106 106  CO2 22 - 32 mmol/L '30 27 22  ' Calcium 8.9 - 10.3 mg/dL 9.4 9.6 8.9  Total Protein 6.5 - 8.1 g/dL 7.2 7.5 7.3  Total Bilirubin 0.3 - 1.2 mg/dL 0.7 0.7 1.1  Alkaline Phos 38 - 126 U/L 86 88 98  AST 15 - 41 U/L '17 18 18  ' ALT 0 - 44 U/L '9 11 9    ' PROCEDURES: 04/27/2018 Echocardiogram Study Conclusions: Left ventricle: The cavity size was normal. Systolic function was   normal. The estimated ejection fraction was in the range of 55%   to 60%. Wall motion was normal; there were no regional wall   motion abnormalities. Left ventricular diastolic function   parameters were normal.  RADIOGRAPHIC STUDIES: I have personally reviewed the radiological images as listed and agreed with the findings in the report. No results found.   ASSESSMENT & PLAN:  Kaitlyn Morgan is a 53 y.o. female with history of  1. Rectal adenocarcinoma, proximal rectum, cT3N1M0, ypT3N1aM0, stage IIIB, moderately differentiated - She was diagnosed on 12/03/2015. She received neoadjuvant chemo and radiation, finished 01/2016.  - Due to the high risk of cancer recurrence after surgery, I previously recommend adjuvant chemo but she declined.  -Her last  surveillance CT of chest, abdominal and pelvic MRI which was done in May 2019, showed no evidence of recurrence. -  She had an echocardiogram on 04/27/2018 and the results were WNL.  -She recently had an episode of febrile illness, likely infectious, is recovering well.  No particular concerning for cancer recurrence, exam was unremarkable. -It is almost 3 years since her initial diagnosis, her risk of recurrence has decreased significantly.  Continue surveillance for a total of 5 years. -Follow-up in 6 months with lab and CT chest, abdomen and  pelvis with contrast a few days before. This will be her last surveillance CT scan.  2. Fatigue, leg edema and mild intermittent dyspnea -I encouraged her to follow a healthy diet and exercise - follow-up with PCP   3. Genetics -she was seen by Dietitian, and her genetic test was negative for lynch syndrome or the following 32 genes: APC, ATM, AXIN2, BARD1, BMPR1A, BRCA1, BRCA2, BRIP1, CDH1, CDK4, CDKN2A, CHEK2, EPCAM, FANCC, MLH1, MSH2, MSH6, MUTYH, NBN, PALB2, PMS2, POLD1, POLE, PTEN, RAD51C, RAD51D, SCG5/GREM1, SMAD4, STK11, TP53, VHL, and XRCC2.   4.Anxiety/Depression -She has high stress due to family issues -headaches may be related -I previously eferred her to our social worker to start talking with someone.    Plan  -F/u in 6 months with lab and CT CAP with contrast a few days before    No problem-specific Assessment & Plan notes found for this encounter.   Orders Placed This Encounter  Procedures  . CT Abdomen Pelvis W Contrast    Standing Status:   Future    Standing Expiration Date:   09/27/2019    Order Specific Question:   If indicated for the ordered procedure, I authorize the administration of contrast media per Radiology protocol    Answer:   Yes    Order Specific Question:   Is patient pregnant?    Answer:   No    Order Specific Question:   Preferred imaging location?    Answer:   [facility]     Order Specific Question:   Is Oral Contrast requested for this exam?    Answer:   Yes, Per Radiology protocol    Order Specific Question:   Radiology Contrast Protocol - do NOT remove file path    Answer:   \\[email or web address]  . CT Chest W Contrast    Standing Status:   Future    Standing Expiration Date:   09/27/2019    Order Specific Question:   If indicated for the ordered procedure, I authorize the administration of contrast media per Radiology protocol    Answer:   Yes    Order Specific Question:   Is patient pregnant?    Answer:   No    Order Specific Question:   Preferred imaging location?    Answer:   [facility]    Order Specific Question:   Radiology Contrast Protocol - do NOT remove file path    Answer:   \\[email or web address]   All questions were answered. The patient knows to call the clinic with any problems, questions or concerns. No barriers to learning was detected. I spent 15 minutes counseling the patient face to face. The total time spent in the appointment was 20 minutes and more than 50% was on counseling and review of test results  I, Manson Allan am acting as scribe for Dr. Truitt Merle.  I have reviewed the above documentation for accuracy and completeness, and I agree with the above.     Truitt Merle, MD 09/27/2018

## 2018-09-27 ENCOUNTER — Inpatient Hospital Stay: Payer: 59 | Attending: Hematology | Admitting: Hematology

## 2018-09-27 ENCOUNTER — Other Ambulatory Visit: Payer: Self-pay

## 2018-09-27 ENCOUNTER — Inpatient Hospital Stay: Payer: 59

## 2018-09-27 VITALS — BP 119/85 | HR 73 | Temp 98.5°F | Resp 17 | Ht 62.0 in | Wt 193.7 lb

## 2018-09-27 DIAGNOSIS — R600 Localized edema: Secondary | ICD-10-CM | POA: Insufficient documentation

## 2018-09-27 DIAGNOSIS — R5383 Other fatigue: Secondary | ICD-10-CM | POA: Diagnosis not present

## 2018-09-27 DIAGNOSIS — C20 Malignant neoplasm of rectum: Secondary | ICD-10-CM

## 2018-09-27 DIAGNOSIS — Z85048 Personal history of other malignant neoplasm of rectum, rectosigmoid junction, and anus: Secondary | ICD-10-CM

## 2018-09-27 DIAGNOSIS — R0600 Dyspnea, unspecified: Secondary | ICD-10-CM | POA: Insufficient documentation

## 2018-09-27 LAB — CBC WITH DIFFERENTIAL (CANCER CENTER ONLY)
Abs Immature Granulocytes: 0.01 10*3/uL (ref 0.00–0.07)
Basophils Absolute: 0 10*3/uL (ref 0.0–0.1)
Basophils Relative: 1 %
Eosinophils Absolute: 0.1 10*3/uL (ref 0.0–0.5)
Eosinophils Relative: 1 %
HCT: 42.5 % (ref 36.0–46.0)
Hemoglobin: 13.6 g/dL (ref 12.0–15.0)
Immature Granulocytes: 0 %
Lymphocytes Relative: 25 %
Lymphs Abs: 1 10*3/uL (ref 0.7–4.0)
MCH: 29.8 pg (ref 26.0–34.0)
MCHC: 32 g/dL (ref 30.0–36.0)
MCV: 93 fL (ref 80.0–100.0)
Monocytes Absolute: 0.3 10*3/uL (ref 0.1–1.0)
Monocytes Relative: 8 %
Neutro Abs: 2.6 10*3/uL (ref 1.7–7.7)
Neutrophils Relative %: 65 %
Platelet Count: 251 10*3/uL (ref 150–400)
RBC: 4.57 MIL/uL (ref 3.87–5.11)
RDW: 13.7 % (ref 11.5–15.5)
WBC Count: 4 10*3/uL (ref 4.0–10.5)
nRBC: 0 % (ref 0.0–0.2)

## 2018-09-27 LAB — CMP (CANCER CENTER ONLY)
ALT: 13 U/L (ref 0–44)
AST: 22 U/L (ref 15–41)
Albumin: 4.5 g/dL (ref 3.5–5.0)
Alkaline Phosphatase: 96 U/L (ref 38–126)
Anion gap: 11 (ref 5–15)
BUN: 10 mg/dL (ref 6–20)
CO2: 26 mmol/L (ref 22–32)
Calcium: 9.6 mg/dL (ref 8.9–10.3)
Chloride: 104 mmol/L (ref 98–111)
Creatinine: 0.92 mg/dL (ref 0.44–1.00)
GFR, Est AFR Am: >60 mL/min (ref >60)
GFR, Estimated: >60 mL/min (ref >60)
Glucose, Bld: 83 mg/dL (ref 70–99)
Potassium: 4.3 mmol/L (ref 3.5–5.1)
Sodium: 141 mmol/L (ref 135–145)
Total Bilirubin: 1.2 mg/dL (ref 0.3–1.2)
Total Protein: 7.9 g/dL (ref 6.5–8.1)

## 2018-09-28 ENCOUNTER — Telehealth: Payer: Self-pay | Admitting: Hematology

## 2018-09-28 LAB — CEA (IN HOUSE-CHCC): CEA (CHCC-In House): 1.52 ng/mL (ref 0.00–5.00)

## 2018-09-28 NOTE — Telephone Encounter (Signed)
Called patient and scheduled appt per 02/13 los.  Gave patient the number to central radiology.  Patient will pick up contrast close to her appt.  Patient aware of appt date and time.

## 2018-09-29 ENCOUNTER — Encounter: Payer: Self-pay | Admitting: Hematology

## 2018-10-02 ENCOUNTER — Telehealth: Payer: Self-pay

## 2018-10-02 NOTE — Telephone Encounter (Signed)
Spoke with patient regarding lab results, per Dr. Burr Medico CBC, CMP and CEA are all normal, no concerns, patient verbalized an understanding.

## 2018-10-02 NOTE — Telephone Encounter (Signed)
-----   Message from Truitt Merle, MD sent at 09/30/2018 10:57 AM EST ----- Please let pt know her lab results, CBC, CMP CEA were all normal, no concerns, thanks   Truitt Merle  09/30/2018

## 2019-03-22 ENCOUNTER — Telehealth: Payer: Self-pay | Admitting: Hematology

## 2019-03-22 NOTE — Telephone Encounter (Signed)
R/s appt per 8/07 sch message- unable to reach pt and unable to leave pt - looks like pt is my chart active . appts to show up in my chart

## 2019-03-25 ENCOUNTER — Ambulatory Visit (HOSPITAL_COMMUNITY): Admission: RE | Admit: 2019-03-25 | Payer: 59 | Source: Ambulatory Visit

## 2019-03-25 ENCOUNTER — Inpatient Hospital Stay: Payer: 59

## 2019-03-28 ENCOUNTER — Ambulatory Visit: Payer: 59 | Admitting: Hematology

## 2019-04-25 NOTE — Progress Notes (Signed)
Kaitlyn Morgan   [phone] Fax:([phone]) [phone]   Clinic Follow up Note   Patient Care Team: Patient, No Pcp Per as PCP - General ([place]) Truitt Merle, MD as Consulting Physician (Hematology) Kyung Rudd, MD as Consulting Physician (Radiation Oncology) Jackolyn Confer, MD as Consulting Physician (General Surgery) Richmond Campbell, MD as Consulting Physician (Gastroenterology) Milus Banister, MD as Attending Physician (Gastroenterology) Tania Ade, RN as Registered Nurse Leighton Ruff, MD as Consulting Physician (General Surgery)  Date of Service:  04/29/2019  CHIEF COMPLAINT: F/u rectal cancer  SUMMARY OF ONCOLOGIC HISTORY: Oncology History Overview Note  Rectal cancer [facility])   Staging form: Colon and Rectum, AJCC 7th Edition     Clinical stage from 12/03/2015: Stage IIIB (T3, N1, M0) - Signed by Truitt Merle, MD on 12/18/2015     Rectal cancer ([place])  12/03/2015 Initial Diagnosis   Rectal cancer ([place])   12/03/2015 Procedure   Colonoscopy showed a partially obstructing tumor in the proximal rectum, its diameter measured 6 mm, biopsed   12/03/2015 Initial Biopsy   Rectum mass biopsy showed adenocarcinoma, moderately differentiated   12/10/2015 Imaging   CT abdomen and pelvis with contrast showed luminal narrowing and mural thickening in the proximal to mid rectum. There is a 6 mm short axis mesorectal lymph node suspicious for metastasis. There are several tiny indeterminate liver lesions    12/17/2015 Imaging   MRI pelvis with and without contrast showed a advanced T3 rectal adenocarcinoma (7cm), N1 disease (at least 2 left-sided nasal rectal lymph nodes are seen measuring 6-7 mm), distance from tumor to the sphincter is 8 cm   12/17/2015 Imaging   CT chest with contrast showed no evidence of metastasis or other changes   12/30/2015 - 02/08/2016 Radiation Therapy   neoadjuvant radiation to rectal cancer    12/30/2015 - 02/08/2016 Chemotherapy   xeloda 1500mg  twice daily with concurent radiation, she tolerated well    03/15/2016 Imaging   CT AP IMPRESSION: 1. No acute findings within the abdomen or pelvis. No evidence for metastatic disease. 2. Mild wall thickening involving the distal sigmoid colon and rectum is identified. Nonspecific and may reflect sequelae of external beam radiation.   03/21/2016 Surgery   Rectosigmoid segmental resection    03/21/2016 Pathology Results   Invasive well differentiated adenocarcinoma, 5cm,  margins are negative, LVI (-), 1 out of 27 nodes were negative.     05/05/2016 - 05/05/2016 Adjuvant Chemotherapy   Adjuvant CAPOX, she had facial numbness and SOB after she completed the first oxaliplatin infusion, and refused more oxaliplatin    12/12/2016 Imaging   CT AP IMPRESSION: 1. Status post resection of rectal carcinoma. No complications identified and no specific features identified to suggest residual/recurrence of tumor along the suture line. 2. Stable tiny low-attenuation foci within the liver. This is too small to characterize. 3. No specific findings identified to suggest metastatic disease.   01/04/2018 Imaging   01/04/2018 CT Chest WO Contrast IMPRESSION: 1. Stable chest CT.  No findings of metastatic disease identified.  01/04/2018 MRI AP WO Contrast IMPRESSION: 1. Surgical changes within the high rectum. No evidence of metastatic disease. Mildly asymmetric soft tissue thickening in this area is likely treatment related. No evidence of locally recurrent disease on the colonoscopy of 2018. 2.  Aortic Atherosclerosis (ICD10-I70.0). 3. Mildly heterogeneous enhancement involving the sacrum is most likely related to prior radiation therapy   04/26/2019 Imaging   CT CAP W Contrast  IMPRESSION: 1. Postoperative findings in  the rectum without evidence of active malignancy. 2. 2 hypodense lesions in the spleen are stable and likely benign. 3.  Aortic Atherosclerosis (ICD10-I70.0). 4.  Nonobstructive left nephrolithiasis.        CURRENT THERAPY:  Surveillance  INTERVAL HISTORY:  Kaitlyn Morgan is here for a follow up rectal cancer. She was last seen by me 7 months ago. She presents to the clinic alone. She notes she is overall doing well. She feels she was sick with COVID before the Pandemic started. She has not felt like that since. She notes she still has not found a PCP. She notes lately she has been constipated. She has not tried stool softener, but will try dulcolax. She notes her urgent care did culture of her naval which showed elevated levels of staph. She was given oral antibiotics but returned after completion. Topical antibiotics did not work. She was recommended to see dermatologist. She has not been yet. She also notes when urination she is able to empty bladder and denies dysuria.   REVIEW OF SYSTEMS:   Constitutional: Denies fevers, chills or abnormal weight loss Eyes: Denies blurriness of vision Ears, nose, mouth, throat, and face: Denies mucositis or sore throat Respiratory: Denies cough, dyspnea or wheezes Cardiovascular: Denies palpitation, chest discomfort or lower extremity swelling Gastrointestinal:  Denies nausea, heartburn (+) Constipation Skin: Denies abnormal skin rashes (+) Staph infection of naval.  Lymphatics: Denies new lymphadenopathy or easy bruising Neurological:Denies numbness, tingling or new weaknesses Behavioral/Psych: Mood is stable, no new changes  All other systems were reviewed with the patient and are negative.  MEDICAL HISTORY:  Past Medical History:  Diagnosis Date  . Anemia    during pregnancy, not now.  . 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 Atypical syncope    1 episode -never had follow up with cardiology-never any further episodes, did go to urgent care.  . Colon cancer [facility])    rectal cancer Radiation and oral chemo with radiation.  . Complication of anesthesia   . Family  history of breast cancer   . Family history of uterine cancer   . Genital lesion, female    at times  . Heart burn    occ.  Marland Kitchen Hx of seasonal allergies   . Motion sickness   . PONV (postoperative nausea and vomiting)   . Rectal cancer ([place])   . Umbilicus discharge     SURGICAL HISTORY: Past Surgical History:  Procedure Laterality Date  . 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 CESAREAN SECTION    . COLON SURGERY    . DIAGNOSTIC LAPAROSCOPY     with D and C and Ovarian cystectomy, tubal ligation done at this time '00  . DILATION AND CURETTAGE OF UTERUS    . TUBAL LIGATION    . XI ROBOTIC ASSISTED LOWER ANTERIOR RESECTION N/A 03/21/2016   Procedure: XI ROBOTIC ASSISTED LOWER ANTERIOR RESECTION;  Surgeon: Leighton Ruff, MD;  Location: [facility];  Service: General;  Laterality: N/A;    I have reviewed the social history and family history with the patient and they are unchanged from previous note.  ALLERGIES:  is allergic to codeine; morphine and related; sucralose; and zolpidem tartrate.  MEDICATIONS:  Current Outpatient Medications  Medication Sig Dispense Refill  . Multiple Vitamins-Minerals (ADULT GUMMY) CHEW Chew 4-6 capsules by mouth daily.    . NON FORMULARY Take 21 drops by mouth 2 (two) times daily. Ginger Roots solution    .  polyethylene glycol (MIRALAX / GLYCOLAX) packet Take 17 g by mouth daily as needed for moderate constipation.    . TURMERIC PO Take 1 capsule by mouth daily as needed.    Marland Kitchen estradiol (ESTRACE) 2 MG tablet Take 2 mg by mouth daily.  0  . phentermine 37.5 MG capsule TK 1 C PO ONCE D IN THE MORNING  2   No current facility-administered medications for this visit.     PHYSICAL EXAMINATION: ECOG PERFORMANCE STATUS: 0 - Asymptomatic  Vitals:   04/29/19 0937  BP: 115/78  Pulse: 71  Resp: 17  Temp: 98.5 F (36.9 C)  SpO2: 100%   Filed Weights   04/29/19 0937  Weight: 209 lb 6.4 oz (95 kg)    GENERAL:alert, no  distress and comfortable SKIN: skin color, texture, turgor are normal, no rashes or significant lesions EYES: normal, Conjunctiva are pink and non-injected, sclera clear  NECK: supple, thyroid normal size, non-tender, without nodularity LYMPH:  no palpable lymphadenopathy in the cervical, axillary  LUNGS: clear to auscultation and percussion with normal breathing effort HEART: regular rate & rhythm and no murmurs and no lower extremity edema ABDOMEN:abdomen soft, non-tender and normal bowel sounds Musculoskeletal:no cyanosis of digits and no clubbing  NEURO: alert & oriented x 3 with fluent speech, no focal motor/sensory deficits  LABORATORY DATA:  I have reviewed the data as listed CBC Latest Ref Rng & Units 04/26/2019 09/27/2018 03/15/2018  WBC 4.0 - 10.5 K/uL 4.6 4.0 3.5(L)  Hemoglobin 12.0 - 15.0 g/dL 13.3 13.6 12.8  Hematocrit 36.0 - 46.0 % 40.4 42.5 38.1  Platelets 150 - 400 K/uL 241 251 212     CMP Latest Ref Rng & Units 04/26/2019 09/27/2018 03/15/2018  Glucose 70 - 99 mg/dL 84 83 83  BUN 6 - 20 mg/dL 11 10 14   Creatinine 0.44 - 1.00 mg/dL 0.83 0.92 0.82  Sodium 135 - 145 mmol/L 141 141 143  Potassium 3.5 - 5.1 mmol/L 4.0 4.3 4.2  Chloride 98 - 111 mmol/L 105 104 106  CO2 22 - 32 mmol/L 27 26 30   Calcium 8.9 - 10.3 mg/dL 9.5 9.6 9.4  Total Protein 6.5 - 8.1 g/dL 7.5 7.9 7.2  Total Bilirubin 0.3 - 1.2 mg/dL 1.0 1.2 0.7  Alkaline Phos 38 - 126 U/L 90 96 86  AST 15 - 41 U/L 21 22 17   ALT 0 - 44 U/L 14 13 9       RADIOGRAPHIC STUDIES: I have personally reviewed the radiological images as listed and agreed with the findings in the report. No results found.   ASSESSMENT & PLAN:  Kaitlyn Morgan is a 53 y.o. female with   1. Rectal adenocarcinoma, proximal rectum, cT3N1M0, ypT3N1aM0, stage IIIB, moderately differentiated -She was diagnosed on 12/03/2015. She received neoadjuvant chemo and radiation, finished 01/2016.  -Due to the high risk of cancer recurrence after surgery, I  previously recommend adjuvant chemo but she declined.  -Her 02/2017 colonoscopy showed 1 benign colon polyp.  -We discussed her CT CAP from 04/26/19 shows NED and left kidney stone  -She is clinically doing well. Labs reviewed from last week, CEA, CBC and CMP WNL. Physical exam unremarkable.  -She is about 3.5 years since her diagnosis. Her risk of recurrence has significantly decreased. I do not plan to scan her again unless she develops concerning symptoms. Continue close surveillance for a total of 5 years.  -I encouraged her to find a PCP soon to monitor her overall health. She is agreeable.  -  F/u in 8 months then yearly.    2. Cancer Screenings, Genetic testing was negative for pathogenetic mutations -I encouraged her to stay up to date with age appropriate cancer screenings such as yearly mammograms with her Gyn and Pap smear every 2-3 years with her Gyn.  -Her last colonoscopy was in 02/2017.   3.Anxiety/Depression -She has high stress due to family issues -headaches may be related -I previously referred her to our social worker to start talking with someone.  4. Recent Staph infection of Naval  -She has been treated with oral antibiotics but infection returned. Topical ointment did not work. She was referred to dermatologist.  Kaitlyn Morgan appears clean on exam today (04/29/19)  5. Constipation  -Recent onset. She feels she cannot completely empty her bowels.  -She has not taken stool softener but plans to start with dulcolax. She did not tolerate Miralax well before.    Plan  -lab and CT CAP reviewed, NED  -Lab and f/u in 8 months    No problem-specific Assessment & Plan notes found for this encounter.   No orders of the defined types were placed in this encounter.  All questions were answered. The patient knows to call the clinic with any problems, questions or concerns. No barriers to learning was detected. I spent 15 minutes counseling the patient face to face. The total  time spent in the appointment was 25 minutes and more than 50% was on counseling and review of test results     Truitt Merle, MD 04/29/2019   I, Joslyn Devon, am acting as scribe for Truitt Merle, MD.   I have reviewed the above documentation for accuracy and completeness, and I agree with the above.

## 2019-04-26 ENCOUNTER — Other Ambulatory Visit: Payer: Self-pay

## 2019-04-26 ENCOUNTER — Inpatient Hospital Stay: Payer: 59 | Attending: Hematology

## 2019-04-26 ENCOUNTER — Ambulatory Visit (HOSPITAL_COMMUNITY)
Admission: RE | Admit: 2019-04-26 | Discharge: 2019-04-26 | Disposition: A | Discharge status: Home / Self Care | Payer: 59 | Source: Ambulatory Visit | Attending: Hematology | Admitting: Hematology

## 2019-04-26 ENCOUNTER — Encounter (HOSPITAL_COMMUNITY): Payer: Self-pay

## 2019-04-26 DIAGNOSIS — C20 Malignant neoplasm of rectum: Secondary | ICD-10-CM | POA: Insufficient documentation

## 2019-04-26 DIAGNOSIS — Z9221 Personal history of antineoplastic chemotherapy: Secondary | ICD-10-CM | POA: Insufficient documentation

## 2019-04-26 DIAGNOSIS — F418 Other specified anxiety disorders: Secondary | ICD-10-CM | POA: Diagnosis not present

## 2019-04-26 DIAGNOSIS — M1990 Unspecified osteoarthritis, unspecified site: Secondary | ICD-10-CM | POA: Insufficient documentation

## 2019-04-26 DIAGNOSIS — I700 Atherosclerosis of aorta: Secondary | ICD-10-CM | POA: Diagnosis not present

## 2019-04-26 DIAGNOSIS — K5900 Constipation, unspecified: Secondary | ICD-10-CM | POA: Insufficient documentation

## 2019-04-26 DIAGNOSIS — Z923 Personal history of irradiation: Secondary | ICD-10-CM | POA: Insufficient documentation

## 2019-04-26 LAB — CBC WITH DIFFERENTIAL/PLATELET
Abs Immature Granulocytes: 0.01 10*3/uL (ref 0.00–0.07)
Basophils Absolute: 0 10*3/uL (ref 0.0–0.1)
Basophils Relative: 1 %
Eosinophils Absolute: 0.1 10*3/uL (ref 0.0–0.5)
Eosinophils Relative: 2 %
HCT: 40.4 % (ref 36.0–46.0)
Hemoglobin: 13.3 g/dL (ref 12.0–15.0)
Immature Granulocytes: 0 %
Lymphocytes Relative: 26 %
Lymphs Abs: 1.2 10*3/uL (ref 0.7–4.0)
MCH: 29.2 pg (ref 26.0–34.0)
MCHC: 32.9 g/dL (ref 30.0–36.0)
MCV: 88.8 fL (ref 80.0–100.0)
Monocytes Absolute: 0.4 10*3/uL (ref 0.1–1.0)
Monocytes Relative: 9 %
Neutro Abs: 2.8 10*3/uL (ref 1.7–7.7)
Neutrophils Relative %: 62 %
Platelets: 241 10*3/uL (ref 150–400)
RBC: 4.55 MIL/uL (ref 3.87–5.11)
RDW: 14 % (ref 11.5–15.5)
WBC: 4.6 10*3/uL (ref 4.0–10.5)
nRBC: 0 % (ref 0.0–0.2)

## 2019-04-26 LAB — COMPREHENSIVE METABOLIC PANEL
ALT: 14 U/L (ref 0–44)
AST: 21 U/L (ref 15–41)
Albumin: 4.3 g/dL (ref 3.5–5.0)
Alkaline Phosphatase: 90 U/L (ref 38–126)
Anion gap: 9 (ref 5–15)
BUN: 11 mg/dL (ref 6–20)
CO2: 27 mmol/L (ref 22–32)
Calcium: 9.5 mg/dL (ref 8.9–10.3)
Chloride: 105 mmol/L (ref 98–111)
Creatinine, Ser: 0.83 mg/dL (ref 0.44–1.00)
GFR calc Af Amer: >60 mL/min (ref >60)
GFR calc non Af Amer: >60 mL/min (ref >60)
Glucose, Bld: 84 mg/dL (ref 70–99)
Potassium: 4 mmol/L (ref 3.5–5.1)
Sodium: 141 mmol/L (ref 135–145)
Total Bilirubin: 1 mg/dL (ref 0.3–1.2)
Total Protein: 7.5 g/dL (ref 6.5–8.1)

## 2019-04-26 LAB — CEA (IN HOUSE-CHCC): CEA (CHCC-In House): 1.24 ng/mL (ref 0.00–5.00)

## 2019-04-26 MED ORDER — SODIUM CHLORIDE (PF) 0.9 % IJ SOLN
INTRAMUSCULAR | Status: AC
  Filled 2019-04-26: qty 50

## 2019-04-26 MED ORDER — IOHEXOL 300 MG/ML  SOLN
100.0000 mL | Freq: Once | INTRAMUSCULAR | Status: AC | PRN
  Administered 2019-04-26: 100 mL via INTRAVENOUS

## 2019-04-29 ENCOUNTER — Encounter: Payer: Self-pay | Admitting: Hematology

## 2019-04-29 ENCOUNTER — Inpatient Hospital Stay (HOSPITAL_BASED_OUTPATIENT_CLINIC_OR_DEPARTMENT_OTHER): Payer: 59 | Admitting: Hematology

## 2019-04-29 ENCOUNTER — Telehealth: Payer: Self-pay | Admitting: Hematology

## 2019-04-29 ENCOUNTER — Other Ambulatory Visit: Payer: Self-pay

## 2019-04-29 VITALS — BP 115/78 | HR 71 | Temp 98.5°F | Resp 17 | Ht 62.0 in | Wt 209.4 lb

## 2019-04-29 DIAGNOSIS — C20 Malignant neoplasm of rectum: Secondary | ICD-10-CM

## 2019-04-29 DIAGNOSIS — F419 Anxiety disorder, unspecified: Secondary | ICD-10-CM | POA: Diagnosis not present

## 2019-04-29 NOTE — Telephone Encounter (Signed)
Scheduled per 09/14 los, patient received after visit summary and calender. °

## 2019-05-17 ENCOUNTER — Telehealth: Payer: Self-pay

## 2019-05-17 NOTE — Telephone Encounter (Signed)
Copied from [place] [id number]. Topic: Quick Communication - See Telephone Encounter >> May 16, 2019  5:16 PM Loma Boston wrote: CRM for notification. See Telephone encounter for: 05/16/19. This patient is a cancer pt and her cancer drs  within [facility] have advised her she really needs to get a PCP. I could not find anyone across the board for more timely appt as she said she needed for N PT. I scheduled this appt and after I had made the appt I realized [facility] was not taking any new pt.  On One Note. She does not care who the PCP is she just is needing a PCP within [facility]. The appt took on automated scheduling so, I 'm very sorry! She was so happy to get an appt  I hated to call her back.  If [facility] can not do this I am sure that the pt would take [facility]  or anyone that would be available. My apologies I normally would not reach out like this, would just cancel but she seems to be in a bad place.

## 2019-05-20 ENCOUNTER — Other Ambulatory Visit: Payer: Self-pay

## 2019-05-20 NOTE — Telephone Encounter (Signed)
FYI same patient Kaitlyn Morgan

## 2019-05-20 NOTE — Telephone Encounter (Signed)
Its ok--- keep it

## 2019-05-21 ENCOUNTER — Encounter: Payer: Self-pay | Admitting: Family Medicine

## 2019-05-21 ENCOUNTER — Ambulatory Visit (INDEPENDENT_AMBULATORY_CARE_PROVIDER_SITE_OTHER): Payer: 59 | Admitting: Family Medicine

## 2019-05-21 VITALS — BP 118/88 | HR 76 | Temp 98.0°F | Resp 18 | Ht 62.0 in | Wt 211.6 lb

## 2019-05-21 DIAGNOSIS — R5383 Other fatigue: Secondary | ICD-10-CM

## 2019-05-21 DIAGNOSIS — I7300 Raynaud's syndrome without gangrene: Secondary | ICD-10-CM | POA: Insufficient documentation

## 2019-05-21 DIAGNOSIS — K5901 Slow transit constipation: Secondary | ICD-10-CM

## 2019-05-21 DIAGNOSIS — R002 Palpitations: Secondary | ICD-10-CM

## 2019-05-21 DIAGNOSIS — C20 Malignant neoplasm of rectum: Secondary | ICD-10-CM

## 2019-05-21 DIAGNOSIS — I700 Atherosclerosis of aorta: Secondary | ICD-10-CM

## 2019-05-21 DIAGNOSIS — K219 Gastro-esophageal reflux disease without esophagitis: Secondary | ICD-10-CM

## 2019-05-21 LAB — COMPREHENSIVE METABOLIC PANEL
ALT: 12 U/L (ref 0–35)
AST: 18 U/L (ref 0–37)
Albumin: 4.4 g/dL (ref 3.5–5.2)
Alkaline Phosphatase: 87 U/L (ref 39–117)
BUN: 11 mg/dL (ref 6–23)
CO2: 29 mEq/L (ref 19–32)
Calcium: 9.5 mg/dL (ref 8.4–10.5)
Chloride: 104 mEq/L (ref 96–112)
Creatinine, Ser: 0.72 mg/dL (ref 0.40–1.20)
GFR: 85.08 mL/min (ref >60.00)
Glucose, Bld: 82 mg/dL (ref 70–99)
Potassium: 4 mEq/L (ref 3.5–5.1)
Sodium: 141 mEq/L (ref 135–145)
Total Bilirubin: 0.9 mg/dL (ref 0.2–1.2)
Total Protein: 7.1 g/dL (ref 6.0–8.3)

## 2019-05-21 LAB — LIPID PANEL
Cholesterol: 159 mg/dL (ref 0–200)
HDL: 45.2 mg/dL (ref >39.00)
LDL Cholesterol: 94 mg/dL (ref 0–99)
NonHDL: 113.91
Total CHOL/HDL Ratio: 4
Triglycerides: 100 mg/dL (ref 0.0–149.0)
VLDL: 20 mg/dL (ref 0.0–40.0)

## 2019-05-21 LAB — VITAMIN D 25 HYDROXY (VIT D DEFICIENCY, FRACTURES): VITD: 26.32 ng/mL — ABNORMAL LOW (ref 30.00–100.00)

## 2019-05-21 LAB — CBC WITH DIFFERENTIAL/PLATELET
Basophils Absolute: 0 10*3/uL (ref 0.0–0.1)
Basophils Relative: 0.9 % (ref 0.0–3.0)
Eosinophils Absolute: 0.1 10*3/uL (ref 0.0–0.7)
Eosinophils Relative: 2.3 % (ref 0.0–5.0)
HCT: 38.8 % (ref 36.0–46.0)
Hemoglobin: 12.7 g/dL (ref 12.0–15.0)
Lymphocytes Relative: 21.1 % (ref 12.0–46.0)
Lymphs Abs: 0.9 10*3/uL (ref 0.7–4.0)
MCHC: 32.8 g/dL (ref 30.0–36.0)
MCV: 89.3 fl (ref 78.0–100.0)
Monocytes Absolute: 0.3 10*3/uL (ref 0.1–1.0)
Monocytes Relative: 6.8 % (ref 3.0–12.0)
Neutro Abs: 3 10*3/uL (ref 1.4–7.7)
Neutrophils Relative %: 68.9 % (ref 43.0–77.0)
Platelets: 246 10*3/uL (ref 150.0–400.0)
RBC: 4.34 Mil/uL (ref 3.87–5.11)
RDW: 14.6 % (ref 11.5–15.5)
WBC: 4.4 10*3/uL (ref 4.0–10.5)

## 2019-05-21 LAB — SARS-COV-2 IGG: SARS-COV-2 IgG: 0.03

## 2019-05-21 LAB — VITAMIN B12: Vitamin B-12: 215 pg/mL (ref 211–911)

## 2019-05-21 LAB — TSH: TSH: 3.16 u[IU]/mL (ref 0.35–4.50)

## 2019-05-21 MED ORDER — OMEPRAZOLE 20 MG PO CPDR: 20.0000 mg | DELAYED_RELEASE_CAPSULE | Freq: Every day | ORAL | 3 refills | Status: DC

## 2019-05-21 NOTE — Progress Notes (Signed)
Patient ID: Kaitlyn Morgan, female    DOB: January 19, 1966  Age: 53 y.o. MRN: [id number]    Subjective:  Subjective  HPI Kaitlyn Morgan presents to establish and has multiple complaints   She has a hx of rectal cancer and sees Dr Burr Medico at the cancer center.   [facility]    She c/o extreme fatigue and palpitations for a while and she is concerned because her mom has tachycardia   She also c/o gerd --- and food feels like it may get stuck--- she taking ppi-- she has a gi dr but has never talked to them about her gerd.   She is  Review of Systems  Constitutional: Positive for fatigue. Negative for appetite change, diaphoresis and unexpected weight change.  Eyes: Negative for pain, redness and visual disturbance.  Respiratory: Negative for cough, chest tightness, shortness of breath and wheezing.   Cardiovascular: Positive for palpitations. Negative for chest pain and leg swelling.  Gastrointestinal: Positive for constipation. Negative for diarrhea, nausea and vomiting.  Endocrine: Negative for cold intolerance, heat intolerance, polydipsia, polyphagia and polyuria.  Genitourinary: Negative for difficulty urinating, dysuria and frequency.  Neurological: Negative for dizziness, light-headedness, numbness and headaches.    History Past Medical History:  Diagnosis Date   Anemia    during pregnancy, not now.   Arthritis    osteoarthritis-neck"some issues with cervical vertebrae"   Atypical syncope    1 episode -never had follow up with cardiology-never any further episodes, did go to urgent care.   Colon cancer [facility])    rectal cancer Radiation and oral chemo with radiation.   Complication of anesthesia    Family history of breast cancer    Family history of uterine cancer    Genital lesion, female    at times   Heart burn    occ.   Hx of seasonal allergies    Motion sickness    PONV (postoperative nausea and vomiting)    Rectal cancer ([place])     Umbilicus discharge     She has a past surgical history that includes Cesarean section; Tubal ligation; Abdominal hysterectomy; Dilation and curettage of uterus; Diagnostic laparoscopy; XI robotic assisted lower anterior resection (N/A, 03/21/2016); and Colon surgery.   Her family history includes Breast cancer in her maternal grandmother; COPD in her father; Cancer (age of onset: 72) in her paternal grandmother; Heart attack in her paternal grandfather; Hypertension in her father, paternal grandfather, sister, and sister; Skin cancer in her cousin; Supraventricular tachycardia in her mother.She reports that she has never smoked. She has never used smokeless tobacco. She reports that she does not drink alcohol or use drugs.  Current Outpatient Medications on File Prior to Visit  Medication Sig Dispense Refill   Multiple Vitamins-Minerals (ADULT GUMMY) CHEW Chew 4-6 capsules by mouth daily.     NON FORMULARY Take 21 drops by mouth 2 (two) times daily. Ginger Roots solution     phentermine 37.5 MG capsule TK 1 C PO ONCE D IN THE MORNING  2   TURMERIC PO Take 1 capsule by mouth daily as needed.     estradiol (ESTRACE) 2 MG tablet Take 2 mg by mouth daily.  0   polyethylene glycol (MIRALAX / GLYCOLAX) packet Take 17 g by mouth daily as needed for moderate constipation.     No current facility-administered medications on file prior to visit.      Objective:  Objective  Physical Exam Vitals signs  and nursing note reviewed.  Constitutional:      Appearance: She is well-developed.  HENT:     Head: Normocephalic and atraumatic.  Eyes:     Conjunctiva/sclera: Conjunctivae normal.  Neck:     Musculoskeletal: Normal range of motion and neck supple.     Thyroid: No thyromegaly.     Vascular: No carotid bruit or JVD.  Cardiovascular:     Rate and Rhythm: Normal rate and regular rhythm.     Heart sounds: Normal heart sounds. No murmur.  Pulmonary:     Effort: Pulmonary effort is normal. No  respiratory distress.     Breath sounds: Normal breath sounds. No wheezing or rales.  Chest:     Chest wall: No tenderness.  Neurological:     Mental Status: She is alert and oriented to person, place, and time.    BP 118/88 (BP Location: Left Arm, Patient Position: Sitting, Cuff Size: Normal)    Pulse 76    Temp 98 F (36.7 C) (Temporal)    Resp 18    Ht 5\' 2"  (1.575 m)    Wt 211 lb 9.6 oz (96 kg)    SpO2 96%    BMI 38.70 kg/m  Wt Readings from Last 3 Encounters:  05/21/19 211 lb 9.6 oz (96 kg)  04/29/19 209 lb 6.4 oz (95 kg)  09/27/18 193 lb 11.2 oz (87.9 kg)     Lab Results  Component Value Date   WBC 4.4 05/21/2019   HGB 12.7 05/21/2019   HCT 38.8 05/21/2019   PLT 246.0 05/21/2019   GLUCOSE 82 05/21/2019   CHOL 159 05/21/2019   TRIG 100.0 05/21/2019   HDL 45.20 05/21/2019   LDLCALC 94 05/21/2019   ALT 12 05/21/2019   AST 18 05/21/2019   NA 141 05/21/2019   K 4.0 05/21/2019   CL 104 05/21/2019   CREATININE 0.72 05/21/2019   BUN 11 05/21/2019   CO2 29 05/21/2019   TSH 3.16 05/21/2019   HGBA1C 5.0 03/14/2016    Ct Chest W Contrast  Result Date: 04/26/2019 CLINICAL DATA:  Restaging of rectal cancer EXAM: CT CHEST, ABDOMEN, AND PELVIS WITH CONTRAST TECHNIQUE: Multidetector CT imaging of the chest, abdomen and pelvis was performed following the standard protocol during bolus administration of intravenous contrast. CONTRAST:  131mL OMNIPAQUE IOHEXOL 300 MG/ML  SOLN COMPARISON:  Multiple exams, including 01/03/2017 FINDINGS: CT CHEST FINDINGS Cardiovascular: Unremarkable Mediastinum/Nodes: Unremarkable Lungs/Pleura: 2 mm left lower lobe nodule along the major fissure, image 70/6, unchanged. Musculoskeletal: Thoracic spondylosis. Mild dextroconvex thoracic scoliosis. CT ABDOMEN PELVIS FINDINGS Hepatobiliary: No significant patent lesions. Gallbladder unremarkable. Pancreas: Unremarkable Spleen: 1.7 cm hypodense lesion inferiorly in the spleen on image 59/2 0.7 cm hypodense  lesion on image 49/2. Both of these are similar to findings on the 01/03/2018 MRI, and likely benign. Adrenals/Urinary Tract: 5 mm left kidney lower pole nonobstructive renal calculus. Adrenal glands normal. Stomach/Bowel: Anastomotic staple line in the rectum. No surrounding tumor. Presacral density is likely therapy related. Vascular/Lymphatic: Aortoiliac atherosclerotic vascular disease. Reproductive: Uterus absent.  Adnexa unremarkable. Other: No supplemental non-categorized findings. Musculoskeletal: Schmorl's nodes along the superior endplates of L2 and L3. Right foraminal impingement at L5-S1 primarily from degenerative disc disease. IMPRESSION: 1. Postoperative findings in the rectum without evidence of active malignancy. 2. 2 hypodense lesions in the spleen are stable and likely benign. 3.  Aortic Atherosclerosis (ICD10-I70.0). 4. Nonobstructive left nephrolithiasis. Electronically Signed   By: Van Clines M.D.   On: 04/26/2019 10:41  Ct Abdomen Pelvis W Contrast  Result Date: 04/26/2019 CLINICAL DATA:  Restaging of rectal cancer EXAM: CT CHEST, ABDOMEN, AND PELVIS WITH CONTRAST TECHNIQUE: Multidetector CT imaging of the chest, abdomen and pelvis was performed following the standard protocol during bolus administration of intravenous contrast. CONTRAST:  157mL OMNIPAQUE IOHEXOL 300 MG/ML  SOLN COMPARISON:  Multiple exams, including 01/03/2017 FINDINGS: CT CHEST FINDINGS Cardiovascular: Unremarkable Mediastinum/Nodes: Unremarkable Lungs/Pleura: 2 mm left lower lobe nodule along the major fissure, image 70/6, unchanged. Musculoskeletal: Thoracic spondylosis. Mild dextroconvex thoracic scoliosis. CT ABDOMEN PELVIS FINDINGS Hepatobiliary: No significant patent lesions. Gallbladder unremarkable. Pancreas: Unremarkable Spleen: 1.7 cm hypodense lesion inferiorly in the spleen on image 59/2 0.7 cm hypodense lesion on image 49/2. Both of these are similar to findings on the 01/03/2018 MRI, and likely  benign. Adrenals/Urinary Tract: 5 mm left kidney lower pole nonobstructive renal calculus. Adrenal glands normal. Stomach/Bowel: Anastomotic staple line in the rectum. No surrounding tumor. Presacral density is likely therapy related. Vascular/Lymphatic: Aortoiliac atherosclerotic vascular disease. Reproductive: Uterus absent.  Adnexa unremarkable. Other: No supplemental non-categorized findings. Musculoskeletal: Schmorl's nodes along the superior endplates of L2 and L3. Right foraminal impingement at L5-S1 primarily from degenerative disc disease. IMPRESSION: 1. Postoperative findings in the rectum without evidence of active malignancy. 2. 2 hypodense lesions in the spleen are stable and likely benign. 3.  Aortic Atherosclerosis (ICD10-I70.0). 4. Nonobstructive left nephrolithiasis. Electronically Signed   By: Van Clines M.D.   On: 04/26/2019 10:41    EKG-- NSR , no abnormality Assessment & Plan:  Plan  I am having Westyn [place] start on omeprazole and aspirin EC. I am also having her maintain her polyethylene glycol, estradiol, phentermine, Adult Gummy, TURMERIC PO, and NON FORMULARY.  Meds ordered this encounter  Medications   omeprazole (PRILOSEC) 20 MG capsule    Sig: Take 1 capsule (20 mg total) by mouth daily.    Dispense:  30 capsule    Refill:  3   aspirin EC 81 MG tablet    Sig: Take 1 tablet (81 mg total) by mouth daily.    Dispense:  30 tablet    Refill:  0    Problem List Items Addressed This Visit      Unprioritized   Atherosclerosis of aorta (HCC) - Primary    Check labs Take baby aspirin daily       Relevant Medications   aspirin EC 81 MG tablet   Other Relevant Orders   Lipid panel (Completed)   CBC with Differential/Platelet (Completed)   Comprehensive metabolic panel (Completed)   TSH (Completed)   Vitamin B12 (Completed)   Vitamin D (25 hydroxy) (Completed)   Gastroesophageal reflux disease   Relevant Medications   omeprazole (PRILOSEC) 20 MG  capsule   Other Relevant Orders   Ambulatory referral to Gastroenterology   Amb Ref to Medical Weight Management   Morbid obesity ([facility])    Refer to healthy weight and wellness       Relevant Orders   Amb Ref to Medical Weight Management   Other fatigue    Check labs  Echo being done as well for hx irregular heart beat and palpitations       Relevant Orders   Lipid panel (Completed)   CBC with Differential/Platelet (Completed)   Comprehensive metabolic panel (Completed)   TSH (Completed)   Vitamin B12 (Completed)   Vitamin D (25 hydroxy) (Completed)   SARS-COV-2 IgG (Completed)   Amb Ref to Medical Weight Management   Palpitation  ekg normal Check labs, echo , event monitor  Pt was told in past she had an irregular heart beat      Relevant Orders   EKG 12-Lead (Completed)   ECHOCARDIOGRAM COMPLETE   Cardiac event monitor   Raynaud's disease    Stable Wears warm socks and gloves when needed       Relevant Medications   aspirin EC 81 MG tablet   Rectal cancer (HCC)    F/u onc/ GI /surgery       Relevant Medications   aspirin EC 81 MG tablet   Slow transit constipation    Pt is taking miralax or colace Pt will f/u GI      Relevant Orders   Lipid panel (Completed)   CBC with Differential/Platelet (Completed)   Comprehensive metabolic panel (Completed)   TSH (Completed)   Vitamin B12 (Completed)   Vitamin D (25 hydroxy) (Completed)   Ambulatory referral to Gastroenterology    Other Visit Diagnoses    Aortic atherosclerosis (HCC)       Relevant Medications   aspirin EC 81 MG tablet   Other Relevant Orders   Amb Ref to Medical Weight Management      Follow-up: Return in about 3 months (around 08/21/2019) for f/u palp and fatigue.  [name], DO

## 2019-05-21 NOTE — Patient Instructions (Signed)

## 2019-05-22 DIAGNOSIS — R002 Palpitations: Secondary | ICD-10-CM | POA: Insufficient documentation

## 2019-05-22 DIAGNOSIS — K5901 Slow transit constipation: Secondary | ICD-10-CM | POA: Insufficient documentation

## 2019-05-22 DIAGNOSIS — R5383 Other fatigue: Secondary | ICD-10-CM | POA: Insufficient documentation

## 2019-05-22 DIAGNOSIS — I700 Atherosclerosis of aorta: Secondary | ICD-10-CM | POA: Insufficient documentation

## 2019-05-22 DIAGNOSIS — K219 Gastro-esophageal reflux disease without esophagitis: Secondary | ICD-10-CM | POA: Insufficient documentation

## 2019-05-22 MED ORDER — ASPIRIN EC 81 MG PO TBEC: 81.0000 mg | DELAYED_RELEASE_TABLET | Freq: Every day | ORAL | 0 refills | Status: DC

## 2019-05-22 NOTE — Assessment & Plan Note (Signed)
Check labs  Echo being done as well for hx irregular heart beat and palpitations

## 2019-05-22 NOTE — Assessment & Plan Note (Signed)
Stable Wears warm socks and gloves when needed

## 2019-05-22 NOTE — Assessment & Plan Note (Signed)
-   Refer to healthy weight and wellness 

## 2019-05-22 NOTE — Assessment & Plan Note (Signed)
F/u onc/ GI Lenore Manner

## 2019-05-22 NOTE — Assessment & Plan Note (Signed)
Pt is taking miralax or colace Pt will f/u GI

## 2019-05-22 NOTE — Assessment & Plan Note (Signed)
Check labs Take baby aspirin daily

## 2019-05-22 NOTE — Assessment & Plan Note (Signed)
ekg normal Check labs, echo , event monitor  Pt was told in past she had an irregular heart beat

## 2019-05-28 ENCOUNTER — Other Ambulatory Visit: Payer: Self-pay

## 2019-05-28 ENCOUNTER — Ambulatory Visit (HOSPITAL_BASED_OUTPATIENT_CLINIC_OR_DEPARTMENT_OTHER)
Admission: RE | Admit: 2019-05-28 | Discharge: 2019-05-28 | Disposition: A | Discharge status: Home / Self Care | Payer: 59 | Source: Ambulatory Visit | Attending: Family Medicine | Admitting: Family Medicine

## 2019-05-28 DIAGNOSIS — R002 Palpitations: Secondary | ICD-10-CM | POA: Diagnosis not present

## 2019-05-28 NOTE — Progress Notes (Signed)
  Echocardiogram 2D Echocardiogram has been performed.  Kaitlyn Morgan 05/28/2019, 9:05 AM

## 2019-06-12 ENCOUNTER — Encounter: Payer: Self-pay | Admitting: Family Medicine

## 2019-06-12 ENCOUNTER — Ambulatory Visit (INDEPENDENT_AMBULATORY_CARE_PROVIDER_SITE_OTHER): Payer: 59 | Admitting: Family Medicine

## 2019-06-12 ENCOUNTER — Other Ambulatory Visit: Payer: Self-pay

## 2019-06-12 DIAGNOSIS — J40 Bronchitis, not specified as acute or chronic: Secondary | ICD-10-CM

## 2019-06-12 DIAGNOSIS — U071 COVID-19: Secondary | ICD-10-CM

## 2019-06-12 MED ORDER — AZITHROMYCIN 250 MG PO TABS: ORAL_TABLET | ORAL | 0 refills | Status: DC

## 2019-06-12 MED ORDER — PROMETHAZINE-DM 6.25-15 MG/5ML PO SYRP: 5.0000 mL | ORAL_SOLUTION | Freq: Four times a day (QID) | ORAL | 0 refills | Status: DC | PRN

## 2019-06-12 MED ORDER — PROAIR RESPICLICK 108 (90 BASE) MCG/ACT IN AEPB: 2.0000 | INHALATION_SPRAY | Freq: Four times a day (QID) | RESPIRATORY_TRACT | 5 refills | Status: DC | PRN

## 2019-06-12 NOTE — Progress Notes (Signed)
Virtual Visit via Video Note  I connected with Kaitlyn Morgan on 06/12/19 at 11:40 AM EDT by a video enabled telemedicine application and verified that I am speaking with the correct person using two identifiers.  Location: Patient: home  Provider: home    I discussed the limitations of evaluation and management by telemedicine and the availability of in person appointments. The patient expressed understanding and agreed to proceed.  History of Present Illness: Pt is home c/o cough/ headache/ congestion-- she tested positive for covid 10/17 at urgent care.  No fever or sob. .  + wheezing  +body aches  She is using mucinex but she is still coughing  Observations/Objective: No fevers now No other vitals obtained Pt in NAD No SOB   Assessment and Plan: 1. COVID-19 virus infection Dx 10/17 Quarantine ended yesterday but pt was still sick  Pt is working from home and self quarantining  2. Bronchitis abx and cough med per orders If sob worsens- - go to ER - azithromycin (ZITHROMAX Z-PAK) 250 MG tablet; As directed  Dispense: 6 each; Refill: 0 - Albuterol Sulfate (PROAIR RESPICLICK) [id number] (90 Base) MCG/ACT AEPB; Inhale 2 Inhalers into the lungs 4 (four) times daily as needed.  Dispense: 1 each; Refill: 5 - promethazine-dextromethorphan (PROMETHAZINE-DM) 6.25-15 MG/5ML syrup; Take 5 mLs by mouth 4 (four) times daily as needed.  Dispense: 118 mL; Refill: 0   Follow Up Instructions:    I discussed the assessment and treatment plan with the patient. The patient was provided an opportunity to ask questions and all were answered. The patient agreed with the plan and demonstrated an understanding of the instructions.   The patient was advised to call back or seek an in-person evaluation if the symptoms worsen or if the condition fails to improve as anticipated.  I provided 15 minutes of non-face-to-face time during this encounter.   [name], DO

## 2019-07-05 ENCOUNTER — Telehealth: Payer: Self-pay | Admitting: Family Medicine

## 2019-07-05 NOTE — Telephone Encounter (Signed)
Copied from [place] [id number]. Topic: General - Other >> Jul 05, 2019 12:28 PM Keene Breath wrote: Reason for CRM: Patient would like the nurse to call her regarding a spot on her skin that she thinks has changed over the last few months.  She is not sure if she should see a dermatologist.  Please advise and call patient to discuss at [phone]

## 2019-07-09 NOTE — Telephone Encounter (Signed)
Left VM. Advised patient if she already has a dermatologist to see them but if she doesn't we can see her and possibly place a referral if needed. Asked to call back to make a appointment.

## 2019-07-13 ENCOUNTER — Encounter (HOSPITAL_COMMUNITY): Payer: Self-pay

## 2019-07-13 ENCOUNTER — Ambulatory Visit (HOSPITAL_COMMUNITY)
Admission: EM | Admit: 2019-07-13 | Discharge: 2019-07-13 | Disposition: A | Discharge status: Home / Self Care | Payer: Self-pay

## 2019-07-13 ENCOUNTER — Ambulatory Visit (INDEPENDENT_AMBULATORY_CARE_PROVIDER_SITE_OTHER): Payer: Self-pay

## 2019-07-13 ENCOUNTER — Other Ambulatory Visit: Payer: Self-pay

## 2019-07-13 DIAGNOSIS — M546 Pain in thoracic spine: Secondary | ICD-10-CM

## 2019-07-13 DIAGNOSIS — S161XXA Strain of muscle, fascia and tendon at neck level, initial encounter: Secondary | ICD-10-CM

## 2019-07-13 DIAGNOSIS — M62838 Other muscle spasm: Secondary | ICD-10-CM

## 2019-07-13 MED ORDER — TRAMADOL HCL 50 MG PO TABS: 50.0000 mg | ORAL_TABLET | Freq: Three times a day (TID) | ORAL | 0 refills | Status: DC | PRN

## 2019-07-13 MED ORDER — NAPROXEN 500 MG PO TABS: 500.0000 mg | ORAL_TABLET | Freq: Two times a day (BID) | ORAL | 0 refills | Status: DC

## 2019-07-13 MED ORDER — CYCLOBENZAPRINE HCL 5 MG PO TABS: 5.0000 mg | ORAL_TABLET | Freq: Every day | ORAL | 0 refills | Status: DC

## 2019-07-13 NOTE — ED Notes (Signed)
Updated on wait time for XR.
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
## 2019-07-13 NOTE — ED Notes (Signed)
Updated on wait. Comfort measures offered.
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
## 2019-07-15 ENCOUNTER — Other Ambulatory Visit: Payer: Self-pay

## 2019-07-16 ENCOUNTER — Encounter: Payer: Self-pay | Admitting: Family Medicine

## 2019-07-16 ENCOUNTER — Ambulatory Visit (INDEPENDENT_AMBULATORY_CARE_PROVIDER_SITE_OTHER): Payer: 59 | Admitting: Family Medicine

## 2019-07-16 DIAGNOSIS — R0683 Snoring: Secondary | ICD-10-CM

## 2019-07-16 DIAGNOSIS — J40 Bronchitis, not specified as acute or chronic: Secondary | ICD-10-CM | POA: Diagnosis not present

## 2019-07-16 DIAGNOSIS — J4520 Mild intermittent asthma, uncomplicated: Secondary | ICD-10-CM | POA: Diagnosis not present

## 2019-07-16 MED ORDER — QVAR REDIHALER 40 MCG/ACT IN AERB: 2.0000 | INHALATION_SPRAY | Freq: Two times a day (BID) | RESPIRATORY_TRACT | 2 refills | Status: DC

## 2019-07-16 MED ORDER — PROMETHAZINE-DM 6.25-15 MG/5ML PO SYRP: 5.0000 mL | ORAL_SOLUTION | Freq: Four times a day (QID) | ORAL | 0 refills | Status: DC | PRN

## 2019-07-16 NOTE — Progress Notes (Signed)
Virtual Visit via Video Note  I connected with Kaitlyn Morgan on 07/16/19 at 10:40 AM EST by a video enabled telemedicine application and verified that I am speaking with the correct person using two identifiers.  Location: Patient: home alone Provider: office    I discussed the limitations of evaluation and management by telemedicine and the availability of in person appointments. The patient expressed understanding and agreed to proceed.  History of Present Illness: Pt is home c/o snoring and wheezing esp at night ---- it occurs sometimes during the day when she is wearing a mask.  She is using the abuterol  1-2 x a day  She states there is a family hx of asthma but she is concerned about sleep apnea too.  No other concerns    Observations/Objective: No vitals obtained  Pt is in NAD   Assessment and Plan: 1. Mild intermittent asthma, unspecified whether complicated con't proair Add qvar and refer to pulmonary Pt may need sleep study as well  - beclomethasone (QVAR REDIHALER) 40 MCG/ACT inhaler; Inhale 2 puffs into the lungs 2 (two) times daily.  Dispense: 10.6 g; Refill: 2 - Ambulatory referral to Pulmonology  2. Snoring Refer to pulm for sleep eval - Ambulatory referral to Pulmonology  3. Bronchitis Refill cough med  - promethazine-dextromethorphan (PROMETHAZINE-DM) 6.25-15 MG/5ML syrup; Take 5 mLs by mouth 4 (four) times daily as needed.  Dispense: 118 mL; Refill: 0   Follow Up Instructions:    I discussed the assessment and treatment plan with the patient. The patient was provided an opportunity to ask questions and all were answered. The patient agreed with the plan and demonstrated an understanding of the instructions.   The patient was advised to call back or seek an in-person evaluation if the symptoms worsen or if the condition fails to improve as anticipated.  I provided 15 minutes of non-face-to-face time during this encounter.   [name],  DO

## 2019-07-23 ENCOUNTER — Telehealth: Payer: Self-pay | Admitting: Family Medicine

## 2019-07-23 NOTE — Telephone Encounter (Signed)
Kaitlyn Morgan -- have you received anything via CoverMyMeds? Can you assist with this PA?  Received fax from [facility] dated 07/18/19 stating QVAR redihaler 48mcg is not covered by pt's insurance and to call [phone] to initiate a prior auth.  Attempted to notify pt by phone of RX status and left message to check mychart acct. Message sent via mychart.

## 2019-07-23 NOTE — Telephone Encounter (Signed)
Qvar Redihaler not covered: Preferred alternatives:   Arnuity Ellipta  Flovent Diskus  Flovent HFA  Pulmicort Flexhaler

## 2019-07-23 NOTE — Telephone Encounter (Signed)
Patient called and would like to speak with Dr. Etter Sjogren or her CMA about another inhaler that she was suppose to received. Please give a patient back, thanks.

## 2019-07-23 NOTE — Telephone Encounter (Signed)
flovent hfa 110 mcg 2 puffs bid #1   3 refills

## 2019-07-23 NOTE — Telephone Encounter (Signed)
Have not received anything from pharmacy informing PA was needed- will initiate via Covermymeds.

## 2019-07-24 ENCOUNTER — Encounter: Payer: Self-pay | Admitting: Pulmonary Disease

## 2019-07-24 ENCOUNTER — Encounter: Payer: Self-pay | Admitting: Sports Medicine

## 2019-07-24 ENCOUNTER — Other Ambulatory Visit: Payer: Self-pay

## 2019-07-24 ENCOUNTER — Ambulatory Visit: Payer: 59 | Admitting: Sports Medicine

## 2019-07-24 ENCOUNTER — Ambulatory Visit (INDEPENDENT_AMBULATORY_CARE_PROVIDER_SITE_OTHER): Payer: Self-pay | Admitting: Sports Medicine

## 2019-07-24 ENCOUNTER — Ambulatory Visit (INDEPENDENT_AMBULATORY_CARE_PROVIDER_SITE_OTHER): Payer: 59 | Admitting: Pulmonary Disease

## 2019-07-24 VITALS — BP 120/82 | Ht 62.0 in | Wt 220.0 lb

## 2019-07-24 VITALS — BP 128/74 | HR 84 | Temp 97.9°F | Ht 62.0 in | Wt 225.0 lb

## 2019-07-24 DIAGNOSIS — S46012A Strain of muscle(s) and tendon(s) of the rotator cuff of left shoulder, initial encounter: Secondary | ICD-10-CM

## 2019-07-24 DIAGNOSIS — G4733 Obstructive sleep apnea (adult) (pediatric): Secondary | ICD-10-CM | POA: Insufficient documentation

## 2019-07-24 DIAGNOSIS — R062 Wheezing: Secondary | ICD-10-CM | POA: Diagnosis not present

## 2019-07-24 DIAGNOSIS — M62830 Muscle spasm of back: Secondary | ICD-10-CM

## 2019-07-24 MED ORDER — FLOVENT HFA 110 MCG/ACT IN AERO: 2.0000 | INHALATION_SPRAY | Freq: Two times a day (BID) | RESPIRATORY_TRACT | 3 refills | Status: DC

## 2019-07-24 MED ORDER — MELOXICAM 15 MG PO TABS: 15.0000 mg | ORAL_TABLET | Freq: Every day | ORAL | 0 refills | Status: DC
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
## 2019-07-24 NOTE — Patient Instructions (Addendum)
Schedule home sleep test STart on flovent mDI
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
## 2019-07-24 NOTE — Assessment & Plan Note (Signed)
May be related to seasonal allergies or weight gain, doubt adult onset asthma This does not seem to be related to Covid infection.  Can start on Flovent MDI and see if this controls symptoms, if persistent will need spirometry by next visit

## 2019-07-24 NOTE — Assessment & Plan Note (Signed)
Given excessive daytime somnolence, narrow pharyngeal exam, witnessed apneas & loud snoring, obstructive sleep apnea is probable & an overnight polysomnogram will be scheduled as a split study. The pathophysiology of obstructive sleep apnea , it's cardiovascular consequences & modes of treatment including CPAP were discused with the patient in detail & they evidenced understanding.  Pretest probability is high, significant weight gain may be contributing

## 2019-07-24 NOTE — Telephone Encounter (Signed)
VM left regarding medication and new Rx sent

## 2019-07-25 ENCOUNTER — Ambulatory Visit: Payer: 59 | Admitting: Pediatrics

## 2019-08-06 ENCOUNTER — Telehealth: Payer: Self-pay | Admitting: Pulmonary Disease

## 2019-08-06 NOTE — Telephone Encounter (Signed)
Pt's order was placed on 12/9.  Made her aware we are backed up on hst's due to increase in covid cases.  Now we are scheduling pt's whose orders were placed 1st week in Nov.  She was hoping to get scheduled before 1st of year due to deductible.  I explained that has been request of several pt's and we will get to her in order that order was placed.  She states ok.  Nothing further needed.

## 2019-08-21 ENCOUNTER — Ambulatory Visit: Payer: 59 | Admitting: Sports Medicine

## 2019-08-23 ENCOUNTER — Ambulatory Visit (INDEPENDENT_AMBULATORY_CARE_PROVIDER_SITE_OTHER): Payer: 59 | Admitting: Family Medicine

## 2019-08-23 ENCOUNTER — Encounter: Payer: Self-pay | Admitting: Family Medicine

## 2019-08-23 ENCOUNTER — Other Ambulatory Visit: Payer: Self-pay

## 2019-08-23 DIAGNOSIS — K219 Gastro-esophageal reflux disease without esophagitis: Secondary | ICD-10-CM

## 2019-08-23 DIAGNOSIS — F419 Anxiety disorder, unspecified: Secondary | ICD-10-CM

## 2019-08-23 MED ORDER — ESCITALOPRAM OXALATE 10 MG PO TABS: 10.0000 mg | ORAL_TABLET | Freq: Every day | ORAL | 2 refills | Status: DC

## 2019-08-23 MED ORDER — OMEPRAZOLE 20 MG PO CPDR: 20.0000 mg | DELAYED_RELEASE_CAPSULE | Freq: Every day | ORAL | 3 refills | Status: DC

## 2019-08-23 NOTE — Progress Notes (Signed)
Virtual Visit via Video Note  I connected with Kaitlyn Morgan on 08/23/19 at 10:00 AM EST by a video enabled telemedicine application and verified that I am speaking with the correct person using two identifiers.  Location: Patient: in car  Provider: home    I discussed the limitations of evaluation and management by telemedicine and the availability of in person appointments. The patient expressed understanding and agreed to proceed.  History of Present Illness: Pt is in her car and c/o pulling in R forearm and upper arm --- it is not bothering her today   she was also nauseated this weekend but that feels better Pt also c/o severe anxiety--  It is affecting everything She is not suicidal   Past Medical History:  Diagnosis Date  . Anemia    during pregnancy, not now.  . 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 Atypical syncope    1 episode -never had follow up with cardiology-never any further episodes, did go to urgent care.  . Colon cancer [facility])    rectal cancer Radiation and oral chemo with radiation.  . Complication of anesthesia   . Family history of breast cancer   . Family history of uterine cancer   . Genital lesion, female    at times  . Heart burn    occ.  Marland Kitchen Hx of seasonal allergies   . Motion sickness   . PONV (postoperative nausea and vomiting)   . Rectal cancer ([place])   . Umbilicus discharge    Current Outpatient Medications on File Prior to Visit  Medication Sig Dispense Refill  . aspirin EC 81 MG tablet Take 1 tablet (81 mg total) by mouth daily. 30 tablet 0  . estradiol (ESTRACE) 2 MG tablet Take 2 mg by mouth daily.  0  . fluticasone (FLOVENT HFA) 110 MCG/ACT inhaler Inhale 2 puffs into the lungs 2 (two) times daily. 1 Inhaler 3  . ibuprofen (ADVIL) 200 MG tablet Take 200 mg by mouth every 6 (six) hours as needed.    . meloxicam (MOBIC) 15 MG tablet Take 1 tablet (15 mg total) by mouth daily. 30 tablet 0  . Multiple  Vitamins-Minerals (ADULT GUMMY) CHEW Chew 4-6 capsules by mouth daily.    . naproxen (NAPROSYN) 500 MG tablet Take 1 tablet (500 mg total) by mouth 2 (two) times daily. 30 tablet 0  . NON FORMULARY Take 21 drops by mouth 2 (two) times daily. Ginger Roots solution    . polyethylene glycol (MIRALAX / GLYCOLAX) packet Take 17 g by mouth daily as needed for moderate constipation.    . TURMERIC PO Take 1 capsule by mouth daily as needed.     No current facility-administered medications on file prior to visit.    Allergies  Allergen Reactions  . Codeine Nausea Only  . Morphine And Related Itching  . Sucralose Nausea And Vomiting    Fever  . Zolpidem Tartrate     Hallucinations     Observations/Objective: There were no vitals filed for this visit.  Pt is in her car  Assessment and Plan: 1. Gastroesophageal reflux disease, unspecified whether esophagitis present Stable--- refill meds - omeprazole (PRILOSEC) 20 MG capsule; Take 1 capsule (20 mg total) by mouth daily.  Dispense: 30 capsule; Refill: 3  2. Anxiety Start lexapro F/u 1 month or sooner prn  - escitalopram (LEXAPRO) 10 MG tablet; Take 1 tablet (10 mg total) by mouth at bedtime.  Dispense: 30 tablet; Refill: 2  Follow Up Instructions:    I discussed the assessment and treatment plan with the patient. The patient was provided an opportunity to ask questions and all were answered. The patient agreed with the plan and demonstrated an understanding of the instructions.   The patient was advised to call back or seek an in-person evaluation if the symptoms worsen or if the condition fails to improve as anticipated.     [name], DO

## 2019-08-27 ENCOUNTER — Telehealth: Payer: Self-pay | Admitting: Hematology

## 2019-08-27 NOTE — Telephone Encounter (Signed)
Scheduled per lab. Called and spoke with pt, confirmed 5/14 appt

## 2019-09-20 ENCOUNTER — Telehealth: Payer: Self-pay | Admitting: Pulmonary Disease

## 2019-09-20 NOTE — Telephone Encounter (Signed)
Home Sleep Study was ordered by Dr. Elsworth Soho on 07/24/19. When I finally got to speak with the patient today she stated that Kaitlyn Morgan breathing at night was much better. She wants to put off doing the home sleep study for now. I told Kaitlyn Morgan when she was ready to call the office and we could get Kaitlyn Morgan set up

## 2019-09-23 NOTE — Telephone Encounter (Signed)
OK 

## 2019-10-23 ENCOUNTER — Other Ambulatory Visit: Payer: Self-pay

## 2019-10-24 ENCOUNTER — Ambulatory Visit (INDEPENDENT_AMBULATORY_CARE_PROVIDER_SITE_OTHER): Payer: 59 | Admitting: Family Medicine

## 2019-10-24 ENCOUNTER — Encounter: Payer: Self-pay | Admitting: Family Medicine

## 2019-10-24 ENCOUNTER — Other Ambulatory Visit: Payer: Self-pay

## 2019-10-24 VITALS — BP 114/80 | HR 80 | Temp 97.3°F | Resp 18 | Ht 62.0 in | Wt 216.8 lb

## 2019-10-24 DIAGNOSIS — R600 Localized edema: Secondary | ICD-10-CM

## 2019-10-24 DIAGNOSIS — R231 Pallor: Secondary | ICD-10-CM | POA: Diagnosis not present

## 2019-10-24 MED ORDER — HYDROCHLOROTHIAZIDE 25 MG PO TABS: 25.0000 mg | ORAL_TABLET | Freq: Every day | ORAL | 3 refills | Status: DC
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
## 2019-10-24 NOTE — Patient Instructions (Signed)
Vasculitis  Vasculitis is inflammation of the blood vessels. With vasculitis, the blood vessels can become thick, narrow, scarred, or weak. Enough blood may not be able to flow through them. This can cause damage to the muscles, kidneys, lungs, brain, and other parts of the body. There are many types of vasculitis. The different types may affect different kinds of blood vessels or different areas of the body. Some types last only a short time, while others last a long time. What are the causes? The exact cause of this condition is not known. However, vasculitis can develop when the body's defense system (immune system) attacks its own blood vessels. This attack can be caused by:  An infection.  An immune system disease, such as lupus, rheumatoid arthritis, or scleroderma.  An allergic reaction to a medicine.  A cancer that affects blood cells, such as leukemia or lymphoma. What increases the risk? The following factors may make you more likely to develop this condition:  Being a smoker.  Being under stress.  Having a physical injury. What are the signs or symptoms? Symptoms of this condition depend on the type of vasculitis that you have. Symptoms that are common to all types of vasculitis include:  Fever.  Poor appetite.  Weight loss.  Feeling very tired (fatigue).  Having aches and pains.  Weakness.  Numbness in an area of your body. Symptoms for specific types of vasculitis include:  Skin problems, such as sores, spots, or rashes.  Trouble seeing.  Trouble breathing.  Coughing up blood.  Blood in your urine.  Headaches.  Stomach pain.  Stuffy or bloody nose. How is this diagnosed? This condition may be diagnosed based on:  Your symptoms.  A physical exam. You may also have tests, including:  Blood tests.  A urine test.  A biopsy of a blood vessel.  A test to measure the electrical signals moving through nerves (nerve conduction  study).  Imaging tests, such as: ? X-rays. ? CT scan. ? Ultrasound. ? MRI. ? Angiogram. How is this treated? Treatment for this condition will depend on the type of vasculitis that you have and how severe the symptoms are. Sometimes treatment is not needed. Treatment often includes:  Medicines.  Physical therapy or occupational therapy. This helps strengthen muscles that were weakened by the disease. You will need to see your health care provider while you are being treated. During follow-up visits, your health care provider may:  Perform blood tests and bone density tests.  Check your blood pressure and blood sugar.  Check for side effects of any medicines you are taking. Vasculitis cannot always be cured. Sometimes symptoms go away but the disease does not (the disease goes into remission). If symptoms return, increased treatment may be needed. Follow these instructions at home:  Take over-the-counter and prescription medicines only as told by your health care provider.  Exercise as directed. Talk with your health care provider about what exercises are okay for you to do. Exercises that increase your heart rate (aerobic exercise), such as walking, are usually recommended. Aerobic exercise helps control your blood pressure and prevent bone loss.  Follow a healthy diet. Make sure your diet includes fruits, vegetables, whole grains, and healthy sources of protein.  Learn as much as you can about vasculitis, and consider joining a support group. ? Talk to other people who have your condition. This may help you cope with the illness. ? Talk with your health care provider if you feel stressed, anxious, or   depressed.  Keep all follow-up visits as told by your health care provider. This is important. Contact a health care provider if:  Your symptoms return or you have new symptoms.  Your fever, fatigue, headache, or weight loss gets worse.  You have signs of infection, such as  redness, swelling, tenderness, warmth, or a new fever.  Your pain does not go away, even after you take pain medicine.  Your nose bleeds. Get help right away if:  Your vision gets worse.  You have chest pain or stomach pain.  You have trouble breathing.  One side of your face or body suddenly becomes weak or numb.  There is blood in your urine. Summary  Vasculitis is inflammation of the blood vessels that may cause them to become thick, narrow, scarred, or weak. Enough blood may not be able to flow through them. This can cause damage throughout your body.  The exact cause of this condition is not known. However, vasculitis can develop when the body's immune system attacks its own blood vessels. This attack may be caused by an infection, an immune system disease, an allergic reaction to a medicine, or a cancer that affects blood cells, such as leukemia or lymphoma.  Vasculitis cannot always be cured. Sometimes symptoms go away but the disease does not (the disease goes into remission). If symptoms return, increased treatment may be needed. This information is not intended to replace advice given to you by your health care provider. Make sure you discuss any questions you have with your health care provider. Document Revised: 09/01/2017 Document Reviewed: 08/22/2017 Elsevier Patient Education  2020 [employer]  

## 2019-10-25 ENCOUNTER — Other Ambulatory Visit (INDEPENDENT_AMBULATORY_CARE_PROVIDER_SITE_OTHER): Payer: 59

## 2019-10-25 ENCOUNTER — Encounter: Payer: Self-pay | Admitting: Family Medicine

## 2019-10-25 ENCOUNTER — Telehealth: Payer: Self-pay | Admitting: Family Medicine

## 2019-10-25 ENCOUNTER — Other Ambulatory Visit: Payer: Self-pay

## 2019-10-25 DIAGNOSIS — R231 Pallor: Secondary | ICD-10-CM | POA: Insufficient documentation

## 2019-10-25 DIAGNOSIS — R600 Localized edema: Secondary | ICD-10-CM | POA: Diagnosis not present

## 2019-10-25 NOTE — Telephone Encounter (Signed)
Pt came in office to do lab and forgot to ask during her visit with provider if she needs to do any special diet with salt since pt is having leg swollen. Please advise. [phone] or to please send message through mychart.

## 2019-10-25 NOTE — Addendum Note (Signed)
Addended by: Kelle Darting A on: 10/25/2019 02:44 PM   Modules accepted: Orders

## 2019-10-25 NOTE — Telephone Encounter (Signed)
DASH diet? Please advise

## 2019-10-25 NOTE — Telephone Encounter (Signed)
Yes DASH diet----- she can look it up or we can send her copy.

## 2019-10-25 NOTE — Telephone Encounter (Signed)
Attempted to call patient. Went to VM. VM was full

## 2019-10-25 NOTE — Assessment & Plan Note (Signed)
Check labs  Refer to vascular for w/u at pt request  It is better today per pt

## 2019-10-25 NOTE — Assessment & Plan Note (Signed)
Elevate legs hctz prn Compression stockings

## 2019-10-26 LAB — CBC WITH DIFFERENTIAL/PLATELET
Absolute Monocytes: 342 cells/uL (ref 200–950)
Basophils Absolute: 62 cells/uL (ref 0–200)
Basophils Relative: 1.1 %
Eosinophils Absolute: 90 cells/uL (ref 15–500)
Eosinophils Relative: 1.6 %
HCT: 39.9 % (ref 35.0–45.0)
Hemoglobin: 13.3 g/dL (ref 11.7–15.5)
Lymphs Abs: 1271 cells/uL (ref 850–3900)
MCH: 29.7 pg (ref 27.0–33.0)
MCHC: 33.3 g/dL (ref 32.0–36.0)
MCV: 89.1 fL (ref 80.0–100.0)
MPV: 10.2 fL (ref 7.5–12.5)
Monocytes Relative: 6.1 %
Neutro Abs: 3836 cells/uL (ref 1500–7800)
Neutrophils Relative %: 68.5 %
Platelets: 301 10*3/uL (ref 140–400)
RBC: 4.48 10*6/uL (ref 3.80–5.10)
RDW: 14 % (ref 11.0–15.0)
Total Lymphocyte: 22.7 %
WBC: 5.6 10*3/uL (ref 3.8–10.8)

## 2019-10-26 LAB — COMPREHENSIVE METABOLIC PANEL
AG Ratio: 1.6 (calc) (ref 1.0–2.5)
ALT: 13 U/L (ref 6–29)
AST: 17 U/L (ref 10–35)
Albumin: 4.4 g/dL (ref 3.6–5.1)
Alkaline phosphatase (APISO): 89 U/L (ref 37–153)
BUN: 10 mg/dL (ref 7–25)
CO2: 29 mmol/L (ref 20–32)
Calcium: 9.8 mg/dL (ref 8.6–10.4)
Chloride: 104 mmol/L (ref 98–110)
Creat: 0.96 mg/dL (ref 0.50–1.05)
Globulin: 2.7 g/dL (calc) (ref 1.9–3.7)
Glucose, Bld: 109 mg/dL — ABNORMAL HIGH (ref 65–99)
Potassium: 4.1 mmol/L (ref 3.5–5.3)
Sodium: 142 mmol/L (ref 135–146)
Total Bilirubin: 0.9 mg/dL (ref 0.2–1.2)
Total Protein: 7.1 g/dL (ref 6.1–8.1)

## 2019-10-26 LAB — SEDIMENTATION RATE: Sed Rate: 11 mm/h (ref 0–30)

## 2019-10-26 LAB — ANA: Anti Nuclear Antibody (ANA): NEGATIVE

## 2019-10-28 NOTE — Telephone Encounter (Signed)
She should still watch her salt and drink or eat something with potassium in it when she takes the fluid pill  ie oj or banana

## 2019-10-29 ENCOUNTER — Encounter (INDEPENDENT_AMBULATORY_CARE_PROVIDER_SITE_OTHER): Payer: Self-pay | Admitting: Family Medicine

## 2019-10-29 ENCOUNTER — Ambulatory Visit (INDEPENDENT_AMBULATORY_CARE_PROVIDER_SITE_OTHER): Payer: 59 | Admitting: Family Medicine

## 2019-10-29 ENCOUNTER — Other Ambulatory Visit: Payer: Self-pay

## 2019-10-29 VITALS — BP 128/74 | HR 68 | Temp 98.0°F | Ht 62.0 in | Wt 210.0 lb

## 2019-10-29 DIAGNOSIS — Z85048 Personal history of other malignant neoplasm of rectum, rectosigmoid junction, and anus: Secondary | ICD-10-CM

## 2019-10-29 DIAGNOSIS — G4733 Obstructive sleep apnea (adult) (pediatric): Secondary | ICD-10-CM

## 2019-10-29 DIAGNOSIS — E538 Deficiency of other specified B group vitamins: Secondary | ICD-10-CM

## 2019-10-29 DIAGNOSIS — R0602 Shortness of breath: Secondary | ICD-10-CM

## 2019-10-29 DIAGNOSIS — Z1331 Encounter for screening for depression: Secondary | ICD-10-CM

## 2019-10-29 DIAGNOSIS — R1319 Other dysphagia: Secondary | ICD-10-CM

## 2019-10-29 DIAGNOSIS — Z6838 Body mass index (BMI) 38.0-38.9, adult: Secondary | ICD-10-CM

## 2019-10-29 DIAGNOSIS — R5383 Other fatigue: Secondary | ICD-10-CM | POA: Diagnosis not present

## 2019-10-29 DIAGNOSIS — E559 Vitamin D deficiency, unspecified: Secondary | ICD-10-CM

## 2019-10-29 DIAGNOSIS — M25551 Pain in right hip: Secondary | ICD-10-CM

## 2019-10-29 DIAGNOSIS — R739 Hyperglycemia, unspecified: Secondary | ICD-10-CM

## 2019-10-29 DIAGNOSIS — I776 Arteritis, unspecified: Secondary | ICD-10-CM

## 2019-10-29 DIAGNOSIS — M25552 Pain in left hip: Secondary | ICD-10-CM

## 2019-10-29 DIAGNOSIS — Z0289 Encounter for other administrative examinations: Secondary | ICD-10-CM

## 2019-10-29 NOTE — Progress Notes (Signed)
Dear Dr. Etter Morgan,   Thank you for referring Kaitlyn Morgan to our clinic. The following note includes my evaluation and treatment recommendations.  Chief Complaint:   OBESITY Kaitlyn Morgan (MR# [id number]) is a 54 y.o. female who presents for evaluation and treatment of obesity and related comorbidities. Current BMI is Body mass index is 38.41 kg/m. Kaitlyn Morgan has been struggling with her weight for many years and has been unsuccessful in either losing weight, maintaining weight loss, or reaching her healthy weight goal.  Kaitlyn Morgan is currently in the action stage of change and ready to dedicate time achieving and maintaining a healthier weight. Kaitlyn Morgan is interested in becoming our patient and working on intensive lifestyle modifications including (but not limited to) diet and exercise for weight loss.  Kaitlyn Morgan has a history of rectal cancer with resection and radiation resulting in stool incontinence at times.  She has flu-like symptoms with Kaitlyn Morgan.  She reports that exercise really helped with weight loss in the past.  She loves [employer].  She has been unable to exercise due to fear of stool incontinence. She is drinking regular soda, juice, and sweet tea.  Kaitlyn Morgan's habits were reviewed today and are as follows: Her family eats meals together, her desired weight loss is 56 pounds, her heaviest weight ever was 216 pounds, she is somewhat of a picky eater, she craves pasta, she snacks frequently in the evenings, she skips breakfast frequently, she is frequently drinking liquids with calories, she frequently makes poor food choices and she struggles with emotional eating.  Depression Screen Kaitlyn Morgan's Food and Mood (modified PHQ-9) score was 21.  Depression screen PHQ 2/9 10/29/2019  Decreased Interest 1  Down, Depressed, Hopeless 3  PHQ - 2 Score 4  Altered sleeping 2  Tired, decreased energy 3  Change in appetite 3  Feeling bad or failure about yourself  3  Trouble concentrating 3   Moving slowly or fidgety/restless 3  Suicidal thoughts 0  PHQ-9 Score 21  Difficult doing work/chores Not difficult at all   Subjective:   1. Other fatigue Kaitlyn Morgan denies daytime somnolence and denies waking up still tired.  Kaitlyn Morgan generally gets 7 or 8 hours of sleep per night, and states that she has generally restful sleep. Snoring is present. Apneic episodes are not present. Epworth Sleepiness Score is 3.  2. SOB (shortness of breath) on exertion Kaitlyn Morgan notes increasing shortness of breath with exercising and seems to be worsening over time with weight gain. She notes getting out of breath sooner with activity than she used to. Kaitlyn Morgan denies shortness of breath at rest or orthopnea.  3. Vitamin D deficiency Kaitlyn Morgan's Vitamin D level was 26.32 on 05/21/2019. She is not currently taking vit D. She denies nausea, vomiting or muscle weakness.  4. Vitamin B12 deficiency She is not a vegetarian.  She does not have a previous diagnosis of pernicious anemia.  She does not have a history of weight loss surgery.   Lab Results  Component Value Date   VITAMINB12 215 05/21/2019   5. OSA (obstructive sleep apnea) Kaitlyn Morgan has a diagnosis of sleep apnea. She reports that she is not using a CPAP regularly.    6. Hyperglycemia Kaitlyn Morgan has a history of some elevated blood glucose readings without a diagnosis of diabetes. She denies polyphagia.  7. History of rectal cancer Kaitlyn Morgan has had resection and radiation.CT abd/pelvis (04/2019) Stomach/Bowel: Anastomotic staple line in the rectum. No surrounding tumor. Presacral density is likely therapy related. Musculoskeletal: Schmorl's nodes along  the superior endplates of L2 and L3. Right foraminal impingement at L5-S1 primarily from degenerative disc disease.  8. Bilateral hip pain This is chronic and worse with standing.  See above CT imaging information. Will recommend sending to Sports Medicine to see what options are available for exercise and  pain control.   9. 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 Already addressed by PCP and will be seeing Vascular. We will follow along.   10. Other dysphagia Kaitlyn Morgan has dysphagia along with dyspepsia.  63. Depression screening Kaitlyn Morgan was screened for depression as part of her new patient workup today.  Assessment/Plan:   1. Other fatigue Kaitlyn Morgan does feel that her weight is causing her energy to be lower than it should be. Fatigue may be related to obesity, depression or many other causes. Labs will be ordered, and in the meanwhile, Kaitlyn Morgan will focus on self care including making healthy food choices, increasing physical activity and focusing on stress reduction.  Orders - EKG 12-Lead - TSH - T4, free - T3 - Anemia panel  2. SOB (shortness of breath) on exertion Kaitlyn Morgan does feel that she gets out of breath more easily that she used to when she exercises. Kaitlyn Morgan's shortness of breath appears to be obesity related and exercise induced. She has agreed to work on weight loss and gradually increase exercise to treat her exercise induced shortness of breath. Will continue to monitor closely.  Orders - CBC with Differential/Platelet - Lipid panel  3. Vitamin D deficiency Low Vitamin D level contributes to fatigue and are associated with obesity, breast, and colon cancer.  Will check vitamin D level today.  Orders - VITAMIN D 25 Hydroxy (Vit-D Deficiency, Fractures)  4. Vitamin B12 deficiency We will continue to monitor. Orders and follow up as documented in patient record.  Counseling . The body needs vitamin B12: to make red blood cells; to make DNA; and to help the nerves work properly so they can carry messages from the brain to the body.  . The main causes of vitamin B12 deficiency include dietary deficiency, digestive diseases, pernicious anemia, and having a surgery in which part of the stomach or small intestine is removed.  . Certain medicines can make  it harder for the body to absorb vitamin B12. These medicines include: heartburn medications; some antibiotics; some medications used to treat diabetes, gout, and high cholesterol.  . In some cases, there are no symptoms of this condition. If the condition leads to anemia or nerve damage, various symptoms can occur, such as weakness or fatigue, shortness of breath, and numbness or tingling in your hands and feet.   . Treatment:  o May include taking vitamin B12 supplements.  o Avoid alcohol.  o Eat lots of healthy foods that contain vitamin B12: - Beef, pork, chicken, [place], and organ meats, such as liver.  - Seafood: This includes clams, rainbow trout, salmon, tuna, and haddock. Eggs.  - Cereal and dairy products that are fortified: This means that vitamin B12 has been added to the food.   5. OSA (obstructive sleep apnea) Intensive lifestyle modifications are the first line treatment for this issue. We discussed several lifestyle modifications today and she will continue to work on diet, exercise and weight loss efforts. We will continue to monitor. Orders and follow up as documented in patient record.  Jamel will be seeing Pulmonology and is planning on a repeat sleep study.  Counseling  Sleep apnea is a condition in which breathing pauses  or becomes shallow during sleep. This happens over and over during the night. This disrupts your sleep and keeps your body from getting the rest that it needs, which can cause tiredness and lack of energy (fatigue) during the day.  Sleep apnea treatment: If you were given a device to open your airway while you sleep, USE IT!  Sleep hygiene:   Limit or avoid alcohol, caffeinated beverages, and cigarettes, especially close to bedtime.   Do not eat a large meal or eat spicy foods right before bedtime. This can lead to digestive discomfort that can make it hard for you to sleep.  Keep a sleep diary to help you and your health care provider figure out what  could be causing your insomnia.  . Make your bedroom a dark, comfortable place where it is easy to fall asleep. ? Put up shades or blackout curtains to block light from outside. ? Use a white noise machine to block noise. ? Keep the temperature cool. . Limit screen use before bedtime. This includes: ? Watching TV. ? Using your smartphone, tablet, or computer. . Stick to a routine that includes going to bed and waking up at the same times every day and night. This can help you fall asleep faster. Consider making a quiet activity, such as reading, part of your nighttime routine. . Try to avoid taking naps during the day so that you sleep better at night. . Get out of bed if you are still awake after 15 minutes of trying to sleep. Keep the lights down, but try reading or doing a quiet activity. When you feel sleepy, go back to bed.  6. Hyperglycemia Fasting labs will be obtained and results with be discussed with Kaitlyn Morgan in 2 weeks at her follow up visit. In the meanwhile Danyetta was started on a lower simple carbohydrate diet and will work on weight loss efforts.  Orders - Comprehensive metabolic panel - Hemoglobin A1c - Insulin, random  7. History of rectal cancer Will monitor symptoms of incontinence re: barrier to exercise.   8. Bilateral hip pain Will follow because mobility and pain control are important for weight management.  Orders - Ambulatory referral to Sports Medicine  9. Vasculitis ([place]) Anah will be seeing Vascular soon. Red flags reviewed.  10. Other dysphagia Counseling: Intensive lifestyle modifications are the first line treatment for this issue. We discussed several lifestyle modifications today and she will continue to work on diet, exercise and weight loss efforts. We will continue to monitor.  11. Depression screening Ziyanna had a positive depression screening. Depression is commonly associated with obesity and often results in emotional eating behaviors.  We will monitor this closely and work on CBT to help improve the non-hunger eating patterns. Referral to Psychology may be required if no improvement is seen as she continues in our clinic.  PHQ-9 is 21 today.  12. Class 2 severe obesity with serious comorbidity and body mass index (BMI) of 38.0 to 38.9 in adult, unspecified obesity type (HCC) Debbe is currently in the action stage of change and her goal is to continue with weight loss efforts. I recommend Julissa begin the structured treatment plan as follows:  She has agreed to the Category 1 Plan.  Exercise goals: No exercise has been prescribed at this time.   Behavioral modification strategies: increasing lean protein intake, decreasing simple carbohydrates, increasing vegetables, increasing water intake, decreasing liquid calories and increasing high fiber foods.  She was informed of the importance of frequent follow-up  visits to maximize her success with intensive lifestyle modifications for her multiple health conditions. She was informed we would discuss her lab results at her next visit unless there is a critical issue that needs to be addressed sooner. Yides agreed to keep her next visit at the agreed upon time to discuss these results.  Objective:   Blood pressure 128/74, pulse 68, temperature 98 F (36.7 C), temperature source Oral, height 5\' 2"  (1.575 m), weight 210 lb (95.3 kg), SpO2 99 %. Body mass index is 38.41 kg/m.  EKG: Normal sinus rhythm, rate 83 bpm.  Indirect Calorimeter completed today shows a VO2 of 186 and a REE of 1297.  Her calculated basal metabolic rate is [id number] thus her basal metabolic rate is worse than expected.  General: Cooperative, alert, well developed, in no acute distress. HEENT: Conjunctivae and lids unremarkable. Cardiovascular: Regular rhythm.  Lungs: Normal work of breathing. Neurologic: No focal deficits.   Lab Results  Component Value Date   CREATININE 0.96 10/25/2019   BUN 10  10/25/2019   NA 142 10/25/2019   K 4.1 10/25/2019   CL 104 10/25/2019   CO2 29 10/25/2019   Lab Results  Component Value Date   ALT 13 10/25/2019   AST 17 10/25/2019   ALKPHOS 87 05/21/2019   BILITOT 0.9 10/25/2019   Lab Results  Component Value Date   HGBA1C 5.0 03/14/2016   Lab Results  Component Value Date   TSH 3.16 05/21/2019   Lab Results  Component Value Date   CHOL 159 05/21/2019   HDL 45.20 05/21/2019   LDLCALC 94 05/21/2019   TRIG 100.0 05/21/2019   CHOLHDL 4 05/21/2019   Lab Results  Component Value Date   WBC 5.6 10/25/2019   HGB 13.3 10/25/2019   HCT 39.9 10/25/2019   MCV 89.1 10/25/2019   PLT 301 10/25/2019   Lab Results  Component Value Date   FERRITIN 16 12/18/2015   Attestation Statements:   This is the patient's first visit at [facility]. The patient's NEW PATIENT PACKET was reviewed at length. Included in the packet: current and past health history, medications, allergies, ROS, gynecologic history (women only), surgical history, family history, social history, weight history, weight loss surgery history (for those that have had weight loss surgery), nutritional evaluation, mood and food questionnaire, PHQ9, Epworth questionnaire, sleep habits questionnaire, patient life and health improvement goals questionnaire. These will all be scanned into the patient's chart under media.   During the visit, I independently reviewed the patient's EKG, bioimpedance scale results, and indirect calorimeter results. I used this information to tailor a meal plan for the patient that will help her to lose weight and will improve her obesity-related conditions going forward. I performed a medically necessary appropriate examination and/or evaluation. I discussed the assessment and treatment plan with the patient. The patient was provided an opportunity to ask questions and all were answered. The patient agreed with the plan and demonstrated an  understanding of the instructions. Labs were ordered at this visit and will be reviewed at the next visit unless more critical results need to be addressed immediately. Clinical information was updated and documented in the EMR.   Time spent on visit including pre-visit chart review and post-visit charting and care was 60 minutes.   I, Water quality scientist, CMA, am acting as Location manager for [employer], DO.  I have reviewed the above documentation for accuracy and completeness, and I agree with the above. Briscoe Deutscher, DO

## 2019-10-30 LAB — COMPREHENSIVE METABOLIC PANEL
ALT: 11 IU/L (ref 0–32)
AST: 20 IU/L (ref 0–40)
Albumin/Globulin Ratio: 1.7 (ref 1.2–2.2)
Albumin: 4.6 g/dL (ref 3.8–4.9)
Alkaline Phosphatase: 101 IU/L (ref 39–117)
BUN/Creatinine Ratio: 16 (ref 9–23)
BUN: 13 mg/dL (ref 6–24)
Bilirubin Total: 0.7 mg/dL (ref 0.0–1.2)
CO2: 25 mmol/L (ref 20–29)
Calcium: 9.6 mg/dL (ref 8.7–10.2)
Chloride: 104 mmol/L (ref 96–106)
Creatinine, Ser: 0.81 mg/dL (ref 0.57–1.00)
GFR calc Af Amer: 97 mL/min/{1.73_m2} (ref >59)
GFR calc non Af Amer: 84 mL/min/{1.73_m2} (ref >59)
Globulin, Total: 2.7 g/dL (ref 1.5–4.5)
Glucose: 84 mg/dL (ref 65–99)
Potassium: 4.5 mmol/L (ref 3.5–5.2)
Sodium: 143 mmol/L (ref 134–144)
Total Protein: 7.3 g/dL (ref 6.0–8.5)

## 2019-10-30 LAB — CBC WITH DIFFERENTIAL/PLATELET
Basophils Absolute: 0.1 10*3/uL (ref 0.0–0.2)
Basos: 1 %
EOS (ABSOLUTE): 0.1 10*3/uL (ref 0.0–0.4)
Eos: 2 %
Hemoglobin: 14.1 g/dL (ref 11.1–15.9)
Immature Grans (Abs): 0 10*3/uL (ref 0.0–0.1)
Immature Granulocytes: 0 %
Lymphocytes Absolute: 1.1 10*3/uL (ref 0.7–3.1)
Lymphs: 26 %
MCH: 29.5 pg (ref 26.6–33.0)
MCHC: 33.3 g/dL (ref 31.5–35.7)
MCV: 89 fL (ref 79–97)
Monocytes Absolute: 0.4 10*3/uL (ref 0.1–0.9)
Monocytes: 8 %
Neutrophils Absolute: 2.8 10*3/uL (ref 1.4–7.0)
Neutrophils: 63 %
Platelets: 287 10*3/uL (ref 150–450)
RBC: 4.78 x10E6/uL (ref 3.77–5.28)
RDW: 14.2 % (ref 11.7–15.4)
WBC: 4.4 10*3/uL (ref 3.4–10.8)

## 2019-10-30 LAB — ANEMIA PANEL
Ferritin: 43 ng/mL (ref 15–150)
Folate, Hemolysate: 427 ng/mL
Folate, RBC: 1007 ng/mL (ref >498)
Hematocrit: 42.4 % (ref 34.0–46.6)
Iron Saturation: 14 % — ABNORMAL LOW (ref 15–55)
Iron: 50 ug/dL (ref 27–159)
Retic Ct Pct: 1.5 % (ref 0.6–2.6)
Total Iron Binding Capacity: 361 ug/dL (ref 250–450)
UIBC: 311 ug/dL (ref 131–425)
Vitamin B-12: 855 pg/mL (ref 232–1245)

## 2019-10-30 LAB — INSULIN, RANDOM: INSULIN: 14.7 u[IU]/mL (ref 2.6–24.9)

## 2019-10-30 LAB — LIPID PANEL
Chol/HDL Ratio: 3.6 ratio (ref 0.0–4.4)
Cholesterol, Total: 167 mg/dL (ref 100–199)
HDL: 47 mg/dL (ref >39)
LDL Chol Calc (NIH): 98 mg/dL (ref 0–99)
Triglycerides: 124 mg/dL (ref 0–149)
VLDL Cholesterol Cal: 22 mg/dL (ref 5–40)

## 2019-10-30 LAB — HEMOGLOBIN A1C
Est. average glucose Bld gHb Est-mCnc: 100 mg/dL
Hgb A1c MFr Bld: 5.1 % (ref 4.8–5.6)

## 2019-10-30 LAB — VITAMIN D 25 HYDROXY (VIT D DEFICIENCY, FRACTURES): Vit D, 25-Hydroxy: 35.9 ng/mL (ref 30.0–100.0)

## 2019-10-30 LAB — T3: T3, Total: 153 ng/dL (ref 71–180)

## 2019-10-30 LAB — TSH: TSH: 3.86 u[IU]/mL (ref 0.450–4.500)

## 2019-10-30 LAB — T4, FREE: Free T4: 1.09 ng/dL (ref 0.82–1.77)

## 2019-11-01 ENCOUNTER — Other Ambulatory Visit: Payer: Self-pay

## 2019-11-01 ENCOUNTER — Ambulatory Visit (INDEPENDENT_AMBULATORY_CARE_PROVIDER_SITE_OTHER)
Admission: RE | Admit: 2019-11-01 | Discharge: 2019-11-01 | Disposition: A | Discharge status: Home / Self Care | Payer: 59 | Source: Ambulatory Visit | Attending: Family Medicine | Admitting: Family Medicine

## 2019-11-01 ENCOUNTER — Encounter: Payer: Self-pay | Admitting: Family Medicine

## 2019-11-01 ENCOUNTER — Ambulatory Visit (INDEPENDENT_AMBULATORY_CARE_PROVIDER_SITE_OTHER): Payer: 59 | Admitting: Family Medicine

## 2019-11-01 VITALS — BP 122/80 | HR 71 | Ht 62.0 in | Wt 216.0 lb

## 2019-11-01 DIAGNOSIS — M7061 Trochanteric bursitis, right hip: Secondary | ICD-10-CM

## 2019-11-01 DIAGNOSIS — R29898 Other symptoms and signs involving the musculoskeletal system: Secondary | ICD-10-CM

## 2019-11-01 DIAGNOSIS — M7062 Trochanteric bursitis, left hip: Secondary | ICD-10-CM

## 2019-11-01 NOTE — Patient Instructions (Signed)
Thank you for coming in today. Get xray soon.  Attend PT.  Work on stretching and strength.  Recheck with me in 4-6 weeks.  Return or contact me sooner if not doing well.    Hip Bursitis Rehab Ask your health care provider which exercises are safe for you. Do exercises exactly as told by your health care provider and adjust them as directed. It is normal to feel mild stretching, pulling, tightness, or discomfort as you do these exercises. Stop right away if you feel sudden pain or your pain gets worse. Do not begin these exercises until told by your health care provider. Stretching exercise This exercise warms up your muscles and joints and improves the movement and flexibility of your hip. This exercise also helps to relieve pain and stiffness. Iliotibial band stretch An iliotibial band is a strong band of muscle tissue that runs from the outer side of your hip to the outer side of your thigh and knee. 1. Lie on your side with your left / right leg in the top position. 2. Bend your left / right knee and grab your ankle. Stretch out your bottom arm to help you balance. 3. Slowly bring your knee back so your thigh is behind your body. 4. Slowly lower your knee toward the floor until you feel a gentle stretch on the outside of your left / right thigh. If you do not feel a stretch and your knee will not fall farther, place the heel of your other foot on top of your knee and pull your knee down toward the floor with your foot. 5. Hold this position for __________ seconds. 6. Slowly return to the starting position. Repeat __________ times. Complete this exercise __________ times a day. Strengthening exercises These exercises build strength and endurance in your hip and pelvis. Endurance is the ability to use your muscles for a long time, even after they get tired. Bridge This exercise strengthens the muscles that move your thigh backward (hip extensors). 1. Lie on your back on a firm surface with  your knees bent and your feet flat on the floor. 2. Tighten your buttocks muscles and lift your buttocks off the floor until your trunk is level with your thighs. ? Do not arch your back. ? You should feel the muscles working in your buttocks and the back of your thighs. If you do not feel these muscles, slide your feet 1-2 inches (2.5-5 cm) farther away from your buttocks. ? If this exercise is too easy, try doing it with your arms crossed over your chest. 3. Hold this position for __________ seconds. 4. Slowly lower your hips to the starting position. 5. Let your muscles relax completely after each repetition. Repeat __________ times. Complete this exercise __________ times a day. Squats This exercise strengthens the muscles in front of your thigh and knee (quadriceps). 1. Stand in front of a table, with your feet and knees pointing straight ahead. You may rest your hands on the table for balance but not for support. 2. Slowly bend your knees and lower your hips like you are going to sit in a chair. ? Keep your weight over your heels, not over your toes. ? Keep your lower legs upright so they are parallel with the table legs. ? Do not let your hips go lower than your knees. ? Do not bend lower than told by your health care provider. ? If your hip pain increases, do not bend as low. 3. Hold the squat  position for __________ seconds. 4. Slowly push with your legs to return to standing. Do not use your hands to pull yourself to standing. Repeat __________ times. Complete this exercise __________ times a day. Hip hike 1. Stand sideways on a bottom step. Stand on your left / right leg with your other foot unsupported next to the step. You can hold on to the railing or wall for balance if needed. 2. Keep your knees straight and your torso square. Then lift your left / right hip up toward the ceiling. 3. Hold this position for __________ seconds. 4. Slowly let your left / right hip lower toward  the floor, past the starting position. Your foot should get closer to the floor. Do not lean or bend your knees. Repeat __________ times. Complete this exercise __________ times a day. Single leg stand 1. Without shoes, stand near a railing or in a doorway. You may hold on to the railing or door frame as needed for balance. 2. Squeeze your left / right buttock muscles, then lift up your other foot. ? Do not let your left / right hip push out to the side. ? It is helpful to stand in front of a mirror for this exercise so you can watch your hip. 3. Hold this position for __________ seconds. Repeat __________ times. Complete this exercise __________ times a day. This information is not intended to replace advice given to you by your health care provider. Make sure you discuss any questions you have with your health care provider. Document Revised: 11/26/2018 Document Reviewed: 11/26/2018 Elsevier Patient Education  [employer].

## 2019-11-01 NOTE — Progress Notes (Signed)
Subjective:    I'm seeing this patient as a consultation for:  Dr. Juleen China. Note will be routed back to referring provider/PCP.  CC: B hip pain  I, Judy Pimple, am serving as a scribe for Dr. Lynne Leader.  HPI: Pt is a 54 y/o female presenting w/ c/o B hip pain x1 year after her radiation.  She rates her pain at a 5/10 at its worse and describes her pain as dull aching just feels very weak.  Pain radiates to refers to lateral thighs.  She has no pain radiating below the level of the knee.  No weakness or numbness below her knee.  No new bowel or bladder dysfunction.  Pain is quite bothersome and interfering with her ability to exercise.  Radiating pain: Down both legs Mechanical hip symptoms: no Low back pain:  Aggravating factors: getting up, going down steps, and has to walk bent over sometimes  Treatments tried: massager  Past medical history, Surgical history, Family history, Social history, Allergies, and medications have been entered into the medical record, reviewed. Medical history is significant for rectal carcinoma with excision external beam radiation to pelvis and chemotherapy about 3 years ago.  Review of Systems: No new headache, visual changes, nausea, vomiting, diarrhea, constipation, dizziness, abdominal pain, skin rash, fevers, chills, night sweats, weight loss, swollen lymph nodes, body aches, joint swelling, muscle aches, chest pain, shortness of breath, mood changes, visual or auditory hallucinations.   Objective:    Vitals:   11/01/19 1019  BP: 122/80  Pulse: 71  SpO2: 97%   General: Well Developed, well nourished, and in no acute distress.   MSK:  L-spine: Nontender to spinal midline. Nontender paraspinal musculature. Normal lumbar motion. Lower extremity reflexes and sensation are intact throughout bilateral extremities.  Right hip: Normal-appearing Normal motion. Tender palpation greater trochanter. Hip abduction and external rotation strength  decreased 4/5 with pain.  Internal rotation adduction strength normal.  Left hip: Normal-appearing Normal motion. Tender palpation greater trochanter. Hip abduction and external rotation strength decreased 4/5 with pain.  Internal rotation and adduction strength slightly decreased without pain.  Lower extremity strength is intact beyond knees with normal foot dorsiflexion plantarflexion strength.    Lab and Radiology Results Pelvis CT scan from 2020 and MRI from 2019 images personally and independently reviewed today.  X-ray images hips bilaterally and pelvis ordered will be done today or at a later date off-site.  Impression and Recommendations:    Assessment and Plan: 54 y.o. female with  Bilateral lateral hip pain associated with hip abductor and external rotation weakness.  Certainly this could be partially due to her external beam radiation therapy a few years ago however I think it is more simple deconditioning and weakness.  Her symptoms today are consistent with trochanteric bursitis/hip abductor tendinopathy.  Fortunately she should do quite well with conservative management.  Plan to refer to physical therapy and do a little bit of home exercise teaching today.  Continue existing medications for pain control and recheck back in about 4 weeks.  Return sooner if needed.  Would consider steroid injection in the future if not improving however would like to avoid steroids as she is on a weight loss journey and these well impair weight loss..   Orders Placed This Encounter  Procedures  . DG HIPS BILAT WITH PELVIS 3-4 VIEWS    Standing Status:   Future    Standing Expiration Date:   12/31/2020    Order Specific Question:   Reason  for Exam (SYMPTOM  OR DIAGNOSIS REQUIRED)    Answer:   eval BL hip pain. Hx XRT    Order Specific Question:   Is patient pregnant?    Answer:   No    Order Specific Question:   Preferred imaging location?    Answer:   [facility]    Order Specific  Question:   Radiology Contrast Protocol - do NOT remove file path    Answer:   [email or web address]  . Ambulatory referral to Physical Therapy    Referral Priority:   Routine    Referral Type:   Physical Medicine    Referral Reason:   Specialty Services Required    Requested Specialty:   Physical Therapy   No orders of the defined types were placed in this encounter.   Discussed warning signs or symptoms. Please see discharge instructions. Patient expresses understanding.   The above documentation has been reviewed and is accurate and complete Lynne Leader

## 2019-11-04 ENCOUNTER — Telehealth: Payer: Self-pay | Admitting: Family Medicine

## 2019-11-04 DIAGNOSIS — M899 Disorder of bone, unspecified: Secondary | ICD-10-CM

## 2019-11-04 NOTE — Telephone Encounter (Signed)
Femur x-ray ordered to follow-up bone lesion seen on hip x-ray. See result note.  Arrange for time to get x-ray done.

## 2019-11-04 NOTE — Telephone Encounter (Signed)
Spoke to patient. She will plan to come by today to have this xray done

## 2019-11-04 NOTE — Telephone Encounter (Signed)
Please call pt to schedule her for a R femur XR at her convenience.  Thanks!

## 2019-11-04 NOTE — Progress Notes (Signed)
You do have some degenerative changes in the hip.However there is a irregularity in the shaft of the right femur that needs to be looked at in more detail.  We will get x-rays of the femur to look at it more detail and if radiology thinks it needs look with an MRI we can arrange for that.  X-ray femur ordered please call my office to schedule a time to get the x-ray done.

## 2019-11-05 ENCOUNTER — Ambulatory Visit (INDEPENDENT_AMBULATORY_CARE_PROVIDER_SITE_OTHER)
Admission: RE | Admit: 2019-11-05 | Discharge: 2019-11-05 | Disposition: A | Discharge status: Home / Self Care | Payer: 59 | Source: Ambulatory Visit | Attending: Family Medicine | Admitting: Family Medicine

## 2019-11-05 ENCOUNTER — Other Ambulatory Visit: Payer: Self-pay

## 2019-11-05 DIAGNOSIS — M899 Disorder of bone, unspecified: Secondary | ICD-10-CM

## 2019-11-06 NOTE — Progress Notes (Signed)
X-ray femur shows reassuring finding.  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

## 2019-11-08 ENCOUNTER — Encounter: Payer: Self-pay | Admitting: Family Medicine

## 2019-11-08 NOTE — Progress Notes (Signed)
Received documentation from [facility] in [place] Fax number [phone]  Initial evaluation was November 06, 2019.  PT agrees with trochanteric bursitis.  Strengthening protocol started. Documentation will be sent to scan.

## 2019-11-12 ENCOUNTER — Ambulatory Visit (INDEPENDENT_AMBULATORY_CARE_PROVIDER_SITE_OTHER): Payer: 59 | Admitting: Family Medicine

## 2019-11-12 ENCOUNTER — Other Ambulatory Visit: Payer: Self-pay

## 2019-11-12 ENCOUNTER — Encounter (INDEPENDENT_AMBULATORY_CARE_PROVIDER_SITE_OTHER): Payer: Self-pay | Admitting: Family Medicine

## 2019-11-12 VITALS — BP 113/75 | HR 68 | Temp 98.2°F | Ht 62.0 in | Wt 208.0 lb

## 2019-11-12 DIAGNOSIS — Z6838 Body mass index (BMI) 38.0-38.9, adult: Secondary | ICD-10-CM

## 2019-11-12 DIAGNOSIS — Z85048 Personal history of other malignant neoplasm of rectum, rectosigmoid junction, and anus: Secondary | ICD-10-CM | POA: Diagnosis not present

## 2019-11-12 DIAGNOSIS — M25552 Pain in left hip: Secondary | ICD-10-CM

## 2019-11-12 DIAGNOSIS — R198 Other specified symptoms and signs involving the digestive system and abdomen: Secondary | ICD-10-CM

## 2019-11-12 DIAGNOSIS — M25551 Pain in right hip: Secondary | ICD-10-CM

## 2019-11-12 DIAGNOSIS — E8881 Metabolic syndrome: Secondary | ICD-10-CM | POA: Diagnosis not present

## 2019-11-13 NOTE — Progress Notes (Signed)
Chief Complaint:   OBESITY Kaitlyn Morgan is here to discuss her progress with her obesity treatment plan along with follow-up of her obesity related diagnoses. Kaitlyn Morgan is on the Category 1 Plan and states she is following her eating plan approximately 90-95% of the time. Kaitlyn Morgan states she is doing PT and walking.  Today's visit was #: 2 Starting weight: 210 lbs Starting date: 10/29/2019 Today's weight: 208 lbs Today's date: 11/12/2019 Total lbs lost to date: 2 lbs Total lbs lost since last in-office visit: 2 lbs  Interim History: Kaitlyn Morgan provides records today that show she is getting closer to 1200 calories per day.  She reports that she feels too full with 4-6 ounces of protein.  Subjective:   1. Insulin resistance Kaitlyn Morgan has a diagnosis of insulin resistance based on her elevated fasting insulin level >5. She continues to work on diet and exercise to decrease her risk of diabetes.  Lab Results  Component Value Date   INSULIN 14.7 10/29/2019   Lab Results  Component Value Date   HGBA1C 5.1 10/29/2019   2. Bilateral hip pain Kaitlyn Morgan was evaluated by Sports Medicine on 11/01/2019.  I have reviewed that visit.  She was diagnosed with trochanteric bursitis of bilateral hips and deconditioning.  She has started PT.  She feels that she "overdid it" and has right sciatica today.  She also complains of a right-sided abdominal cramp intermittently, usually while sitting.  Description is that it is ike a Nurse, mental health.  No trigger noted.  3. History of rectal cancer Kaitlyn Morgan also has abdominal spasm (though possible psoas) and rectal incompetence.  4. Umbilical discharge This is chronic, intermittent, with erythema and blood drainage.  No flare today, but patient shows video.    Assessment/Plan:   1. Insulin resistance Kaitlyn Morgan will continue to work on weight loss, exercise, and decreasing simple carbohydrates to help decrease the risk of diabetes. Kaitlyn Morgan agreed to follow-up with [place]  as directed to closely monitor her progress.  2. Bilateral hip pain Will follow because mobility and pain control are important for weight management.  3. History of rectal cancer Will refer to GI, as below.   Orders - Ambulatory referral to Gastroenterology  4. Umbilical discharge Will consider [place] to evaluate for fistula/cyst when flares, but will ask GI to evaluate as well.  She has already seen Dermatology, who was concerned as well.  Orders - Ambulatory referral to Gastroenterology  5. Class 2 severe obesity with serious comorbidity and body mass index (BMI) of 38.0 to 38.9 in adult, unspecified obesity type (HCC) Kaitlyn Morgan is currently in the action stage of change. As such, her goal is to continue with weight loss efforts. She has agreed to the Category 1 Plan.   Exercise goals: As is.  Behavioral modification strategies: decreasing simple carbohydrates, increasing vegetables and increasing water intake.  Kaitlyn Morgan has agreed to follow-up with our clinic in 2 weeks. She was informed of the importance of frequent follow-up visits to maximize her success with intensive lifestyle modifications for her multiple health conditions.   Objective:   Blood pressure 113/75, pulse 68, temperature 98.2 F (36.8 C), temperature source Oral, height 5\' 2"  (1.575 m), weight 208 lb (94.3 kg), SpO2 99 %. Body mass index is 38.04 kg/m.  General: Cooperative, alert, well developed, in no acute distress. HEENT: Conjunctivae and lids unremarkable. Cardiovascular: Regular rhythm.  Lungs: Normal work of breathing. Neurologic: No focal deficits.   Lab Results  Component Value Date   CREATININE 0.81  10/29/2019   BUN 13 10/29/2019   NA 143 10/29/2019   K 4.5 10/29/2019   CL 104 10/29/2019   CO2 25 10/29/2019   Lab Results  Component Value Date   ALT 11 10/29/2019   AST 20 10/29/2019   ALKPHOS 101 10/29/2019   BILITOT 0.7 10/29/2019   Lab Results  Component Value Date   HGBA1C 5.1  10/29/2019   HGBA1C 5.0 03/14/2016   Lab Results  Component Value Date   INSULIN 14.7 10/29/2019   Lab Results  Component Value Date   TSH 3.860 10/29/2019   Lab Results  Component Value Date   CHOL 167 10/29/2019   HDL 47 10/29/2019   LDLCALC 98 10/29/2019   TRIG 124 10/29/2019   CHOLHDL 3.6 10/29/2019   Lab Results  Component Value Date   WBC 4.4 10/29/2019   HGB 14.1 10/29/2019   HCT 42.4 10/29/2019   MCV 89 10/29/2019   PLT 287 10/29/2019   Lab Results  Component Value Date   IRON 50 10/29/2019   TIBC 361 10/29/2019   FERRITIN 43 10/29/2019   Attestation Statements:   Reviewed by clinician on day of visit: allergies, medications, problem list, medical history, surgical history, family history, social history, and previous encounter notes.  I, Water quality scientist, CMA, am acting as Location manager for [employer], DO.  I have reviewed the above documentation for accuracy and completeness, and I agree with the above. Briscoe Deutscher, DO

## 2019-12-02 ENCOUNTER — Other Ambulatory Visit: Payer: Self-pay | Admitting: *Deleted

## 2019-12-02 DIAGNOSIS — I83893 Varicose veins of bilateral lower extremities with other complications: Secondary | ICD-10-CM

## 2019-12-05 ENCOUNTER — Telehealth (HOSPITAL_COMMUNITY): Payer: Self-pay

## 2019-12-05 NOTE — Telephone Encounter (Signed)

## 2019-12-06 ENCOUNTER — Ambulatory Visit (HOSPITAL_COMMUNITY)
Admission: RE | Admit: 2019-12-06 | Discharge: 2019-12-06 | Disposition: A | Discharge status: Home / Self Care | Payer: 59 | Source: Ambulatory Visit | Attending: Vascular Surgery | Admitting: Vascular Surgery

## 2019-12-06 ENCOUNTER — Ambulatory Visit (INDEPENDENT_AMBULATORY_CARE_PROVIDER_SITE_OTHER): Payer: 59 | Admitting: Physician Assistant

## 2019-12-06 ENCOUNTER — Other Ambulatory Visit: Payer: Self-pay

## 2019-12-06 VITALS — BP 116/78 | HR 63 | Resp 16 | Ht 62.0 in | Wt 205.4 lb

## 2019-12-06 DIAGNOSIS — I83893 Varicose veins of bilateral lower extremities with other complications: Secondary | ICD-10-CM | POA: Diagnosis not present

## 2019-12-06 DIAGNOSIS — R600 Localized edema: Secondary | ICD-10-CM

## 2019-12-06 DIAGNOSIS — R231 Pallor: Secondary | ICD-10-CM

## 2019-12-06 NOTE — Progress Notes (Signed)
Requested by:  [address] [place], [place] [address] [place],  [place] [address]  Reason for consultation: Lower extremity edema  History of Present Illness   Kaitlyn Morgan is a 54 y.o. (1966/08/10) female who presents for evaluation of chronic right lower extremity edema.  The patient states she first noticed right lower extremity edema after her diagnosis of rectal cancer in 2017.  She underwent exradiation therapy followed by oral and IV chemotherapy which she had to stop secondary to a reaction.  She also underwent robot-assisted resection of her tumor and had 3+ lymph nodes.  She has done well since surgery.  Today she primarily reports a web like pattern to the right proximal anterior lower leg.  She denies lower extremity pain.  She has no prior history of deep venous thrombosis, clotting disorders.  Her past medical history is significant also for Raynaud's syndrome.   Venous symptoms include: ( +aching,  +heavy,  +tired, - throbbing, -burning, -itching, +swelling, -bleeding,- ulcer)  Onset/duration:  3 months  Occupation:  Desk job.  Sits all day Aggravating factors: (+sitting, +standing) Alleviating factors: (elevation) Compression:  yes Helps:  yes Pain medications: NSAIDs Previous vein procedures:  none History of DVT:  none  Past Medical History:  Diagnosis Date  . Anemia    during pregnancy, not now.  . 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 Asthma   . Atypical syncope    1 episode -never had follow up with cardiology-never any further episodes, did go to urgent care.  . Back pain   . Colon cancer [facility])    rectal cancer Radiation and oral chemo with radiation.  . Complication of anesthesia   . Constipation   . Edema, lower extremity   . Family history of breast cancer   . Family history of uterine cancer   . Genital lesion, female    at times  . Heart burn    occ.  Marland Kitchen Hx of seasonal allergies   . Joint  pain   . Motion sickness   . PONV (postoperative nausea and vomiting)   . Rectal cancer ([facility])   . Shortness of breath   . Swallowing difficulty   . Umbilicus discharge     Past Surgical History:  Procedure Laterality Date  . 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 CESAREAN SECTION    . COLON SURGERY    . DIAGNOSTIC LAPAROSCOPY     with D and C and Ovarian cystectomy, tubal ligation done at this time '00  . DILATION AND CURETTAGE OF UTERUS    . TUBAL LIGATION    . XI ROBOTIC ASSISTED LOWER ANTERIOR RESECTION N/A 03/21/2016   Procedure: XI ROBOTIC ASSISTED LOWER ANTERIOR RESECTION;  Surgeon: Leighton Ruff, MD;  Location: [facility];  Service: General;  Laterality: N/A;    Social History   Socioeconomic History  . Marital status: Legally Separated    Spouse name: Not on file  . Number of children: 3  . Years of education: Not on file  . Highest education level: Not on file  Occupational History  . Occupation: Education officer, community  Tobacco Use  . Smoking status: Never Smoker  . Smokeless tobacco: Never Used  Substance and Sexual Activity  . Alcohol use: No    Alcohol/week: 0.0 standard drinks  . Drug use: No  . Sexual activity: Not on file  Other Topics Concern  .  Not on file  Social History Narrative  . Not on file   Social Determinants of Health   Financial Resource Strain:   . Difficulty of Paying Living Expenses:   Food Insecurity:   . Worried About Charity fundraiser in the Last Year:   . Arboriculturist in the Last Year:   Transportation Needs:   . Film/video editor (Medical):   Marland Kitchen Lack of Transportation (Non-Medical):   Physical Activity:   . Days of Exercise per Week:   . Minutes of Exercise per Session:   Stress:   . Feeling of Stress :   Social Connections:   . Frequency of Communication with Friends and Family:   . Frequency of Social Gatherings with Friends and Family:   . Attends Religious Services:   . Active Member  of Clubs or Organizations:   . Attends Archivist Meetings:   Marland Kitchen Marital Status:   Intimate Partner Violence:   . Fear of Current or Ex-Partner:   . Emotionally Abused:   Marland Kitchen Physically Abused:   . Sexually Abused:     Family History  Problem Relation Age of Onset  . Breast cancer Maternal Grandmother        dx in her 86s  . Cancer Paternal Grandmother 67       uterine cancer   . Hypertension Father   . COPD Father   . Alcoholism Father   . Hypertension Sister   . Hypertension Paternal Grandfather   . Heart attack Paternal Grandfather   . Hypertension Sister   . Supraventricular tachycardia Mother   . Skin cancer Cousin        maternal first cousin    Current Outpatient Medications  Medication Sig Dispense Refill  . aspirin EC 81 MG tablet Take 1 tablet (81 mg total) by mouth daily. 30 tablet 0  . hydrochlorothiazide (HYDRODIURIL) 25 MG tablet Take 1 tablet (25 mg total) by mouth daily. 90 tablet 3  . ibuprofen (ADVIL) 200 MG tablet Take 200 mg by mouth every 6 (six) hours as needed.    . meloxicam (MOBIC) 15 MG tablet Take 1 tablet (15 mg total) by mouth daily. 30 tablet 0  . Multiple Vitamins-Minerals (ADULT GUMMY) CHEW Chew 4-6 capsules by mouth daily.    . naproxen (NAPROSYN) 500 MG tablet Take 1 tablet (500 mg total) by mouth 2 (two) times daily. 30 tablet 0  . NON FORMULARY Take 21 drops by mouth 2 (two) times daily. Ginger Roots solution    . omeprazole (PRILOSEC) 20 MG capsule Take 1 capsule (20 mg total) by mouth daily. 30 capsule 3  . polyethylene glycol (MIRALAX / GLYCOLAX) packet Take 17 g by mouth daily as needed for moderate constipation.    . TURMERIC PO Take 1 capsule by mouth daily as needed.     No current facility-administered medications for this visit.    Allergies  Allergen Reactions  . Codeine Nausea Only  . Morphine And Related Itching  . Sucralose Nausea And Vomiting    Fever  . Zolpidem Tartrate     Hallucinations     REVIEW OF  SYSTEMS (negative unless checked):   Cardiac:  []  Chest pain or chest pressure? []  Shortness of breath upon activity? []  Shortness of breath when lying flat? []  Irregular heart rhythm?  Vascular:  []  Pain in calf, thigh, or hip brought on by walking? []  Pain in feet at night that wakes you up from your sleep? []   Blood clot in your veins? []  Leg swelling?  Pulmonary:  []  Oxygen at home? []  Productive cough? []  Wheezing?  Neurologic:  []  Sudden weakness in arms or legs? []  Sudden numbness in arms or legs? []  Sudden onset of difficult speaking or slurred speech? []  Temporary loss of vision in one eye? []  Problems with dizziness?  Gastrointestinal:  []  Blood in stool? []  Vomited blood?  Genitourinary:  []  Burning when urinating? []  Blood in urine?  Psychiatric:  []  Major depression  Hematologic:  []  Bleeding problems? []  Problems with blood clotting?  Dermatologic:  []  Rashes or ulcers?  Constitutional:  []  Fever or chills?  Ear/Nose/Throat:  []  Change in hearing? []  Nose bleeds? []  Sore throat?  Musculoskeletal:  []  Back pain? []  Joint pain? []  Muscle pain?   Physical Examination    There were no vitals filed for this visit. There is no height or weight on file to calculate BMI.  General:  WDWN in NAD; vital signs documented above Gait: Not observed HENT: WNL, normocephalic Pulmonary: normal non-labored breathing , without Rales, rhonchi,  wheezing Cardiac: regular HR, without  Murmurs without carotid bruits Skin: without rashes Vascular Exam/Pulses:  Right Left  Radial 1+ (weak) 1+ (weak)  Ulnar 1+ (weak) 1+ (weak)  Femoral 1+ (weak) 1+ (weak)  Popliteal absent absent  DP 2+ (normal) 2+ (normal)  PT trace trace   Extremities: without varicose veins, without reticular veins, without edema, without stasis pigmentation, without lipodermatosclerosis, without ulcers Musculoskeletal: no muscle wasting or atrophy  Neurologic: A&O X 3;  No focal  weakness or paresthesias are detected Psychiatric:  The pt has Normal affect.    Non-invasive Vascular Imaging   RLE Venous Insufficiency Duplex (12/06/2019):  Right:  - No evidence of deep vein thrombosis seen in the right lower extremity,  from the common femoral through the popliteal veins.  - No evidence of superficial venous thrombosis in the right lower  extremity.  - There is no evidence of venous reflux seen in the right lower extremity.   Medical Decision Making   Kaitlyn Morgan is a 54 y.o. female who presents with: RLE chronic edema; livedo reticularis of right lower leg; tired legs  Based on the patient's history and examination, I recommend: elevation, exercise and weight control.  I discussed with the patient the use of her 10-15 mm knee high compression stockings.  I discussed the case with Dr. Donzetta Matters. No treatment for LR.  Thank you for allowing [place] to participate in this patient's care.   Barbie Banner, PA-C [facility] Office: [phone]  12/06/2019, 2:07 PM  Clinic MD: Donzetta Matters

## 2019-12-09 ENCOUNTER — Ambulatory Visit (INDEPENDENT_AMBULATORY_CARE_PROVIDER_SITE_OTHER): Payer: 59 | Admitting: Family Medicine

## 2019-12-09 ENCOUNTER — Encounter (INDEPENDENT_AMBULATORY_CARE_PROVIDER_SITE_OTHER): Payer: Self-pay | Admitting: Family Medicine

## 2019-12-09 ENCOUNTER — Other Ambulatory Visit: Payer: Self-pay

## 2019-12-09 VITALS — BP 103/69 | HR 73 | Temp 97.6°F | Ht 62.0 in | Wt 206.0 lb

## 2019-12-09 DIAGNOSIS — R790 Abnormal level of blood mineral: Secondary | ICD-10-CM | POA: Diagnosis not present

## 2019-12-09 DIAGNOSIS — Z6837 Body mass index (BMI) 37.0-37.9, adult: Secondary | ICD-10-CM

## 2019-12-09 DIAGNOSIS — E559 Vitamin D deficiency, unspecified: Secondary | ICD-10-CM

## 2019-12-09 DIAGNOSIS — E8881 Metabolic syndrome: Secondary | ICD-10-CM | POA: Diagnosis not present

## 2019-12-09 DIAGNOSIS — S161XXA Strain of muscle, fascia and tendon at neck level, initial encounter: Secondary | ICD-10-CM

## 2019-12-10 NOTE — Progress Notes (Signed)
Chief Complaint:   OBESITY Kaitlyn Morgan is here to discuss her progress with her obesity treatment plan along with follow-up of her obesity related diagnoses. Kaitlyn Morgan is on the Category 1 Plan and states she is following her eating plan approximately 90% of the time. Kaitlyn Morgan states she is doing PT for 60 minutes 2 times per week and walking for 45 minutes 1 times per week.  Today's visit was #: 3 Starting weight: 210 lbs Starting date: 10/29/2019 Today's weight: 202 lbs Today's date: 12/09/2019 Total lbs lost to date: 8 lbs Total lbs lost since last in-office visit: 6 lbs  Interim History: Kaitlyn Morgan says she is doing well with the plan.  She is not tired of the meals yet.  Denies polyphagia.  Denies cravings.  Subjective:   1. Acute strain of neck muscle, initial encounter Kaitlyn Morgan says she began having pain after she painted her ceiling.  It inhibited her exercise.  2. Insulin resistance Kaitlyn Morgan has a diagnosis of insulin resistance based on her elevated fasting insulin level >5. She continues to work on diet and exercise to decrease her risk of diabetes.  Denies polyphagia.  Lab Results  Component Value Date   INSULIN 14.7 10/29/2019   Lab Results  Component Value Date   HGBA1C 5.1 10/29/2019   3. Low ferritin Kaitlyn Morgan has a history of rectal cancer.  Lab Results  Component Value Date   FERRITIN 43 10/29/2019   4. Vitamin D deficiency Kaitlyn Morgan's Vitamin D level was 35.9 on 10/29/2019. She is currently taking no vitamin D supplement. She denies nausea, vomiting or muscle weakness.  Assessment/Plan:   1. Acute strain of neck muscle, initial encounter Improved, so she will increase exercise.  2. Insulin resistance Kaitlyn Morgan will continue to work on weight loss, exercise, and decreasing simple carbohydrates to help decrease the risk of diabetes. Kaitlyn Morgan agreed to follow-up with [place] as directed to closely monitor her progress.  3. Low ferritin Will continue to monitor.  4.  Vitamin D deficiency Low Vitamin D level contributes to fatigue and are associated with obesity, breast, and colon cancer. She agrees to continue to take prescription Vitamin D @50 ,000 IU every week and will follow-up for routine testing of Vitamin D, at least 2-3 times per year to avoid over-replacement.  5. Class 2 severe obesity due to excess calories with serious comorbidity and body mass index (BMI) of 37.0 to 37.9 in adult Kaitlyn Morgan) Kaitlyn Morgan is currently in the action stage of change. As such, her goal is to continue with weight loss efforts. She has agreed to the Category 1 Plan.   Exercise goals: For substantial health benefits, adults should do at least 150 minutes (2 hours and 30 minutes) a week of moderate-intensity, or 75 minutes (1 hour and 15 minutes) a week of vigorous-intensity aerobic physical activity, or an equivalent combination of moderate- and vigorous-intensity aerobic activity. Aerobic activity should be performed in episodes of at least 10 minutes, and preferably, it should be spread throughout the week.  Behavioral modification strategies: increasing lean protein intake and increasing water intake.  Kaitlyn Morgan has agreed to follow-up with our clinic in 2-3 weeks. She was informed of the importance of frequent follow-up visits to maximize her success with intensive lifestyle modifications for her multiple health conditions.   Objective:   Blood pressure 103/69, pulse 73, temperature 97.6 F (36.4 C), temperature source Oral, height 5\' 2"  (1.575 m), weight 206 lb (93.4 kg), SpO2 99 %. Body mass index is 37.68 kg/m.  General:  Cooperative, alert, well developed, in no acute distress. HEENT: Conjunctivae and lids unremarkable. Cardiovascular: Regular rhythm.  Lungs: Normal work of breathing. Neurologic: No focal deficits.   Lab Results  Component Value Date   CREATININE 0.81 10/29/2019   BUN 13 10/29/2019   NA 143 10/29/2019   K 4.5 10/29/2019   CL 104 10/29/2019   CO2 25  10/29/2019   Lab Results  Component Value Date   ALT 11 10/29/2019   AST 20 10/29/2019   ALKPHOS 101 10/29/2019   BILITOT 0.7 10/29/2019   Lab Results  Component Value Date   HGBA1C 5.1 10/29/2019   HGBA1C 5.0 03/14/2016   Lab Results  Component Value Date   INSULIN 14.7 10/29/2019   Lab Results  Component Value Date   TSH 3.860 10/29/2019   Lab Results  Component Value Date   CHOL 167 10/29/2019   HDL 47 10/29/2019   LDLCALC 98 10/29/2019   TRIG 124 10/29/2019   CHOLHDL 3.6 10/29/2019   Lab Results  Component Value Date   WBC 4.4 10/29/2019   HGB 14.1 10/29/2019   HCT 42.4 10/29/2019   MCV 89 10/29/2019   PLT 287 10/29/2019   Lab Results  Component Value Date   IRON 50 10/29/2019   TIBC 361 10/29/2019   FERRITIN 43 10/29/2019   Attestation Statements:   Reviewed by clinician on day of visit: allergies, medications, problem list, medical history, surgical history, family history, social history, and previous encounter notes.  I, Water quality scientist, CMA, am acting as Location manager for [employer], DO.  I have reviewed the above documentation for accuracy and completeness, and I agree with the above. Briscoe Deutscher, DO

## 2019-12-13 ENCOUNTER — Ambulatory Visit: Payer: 59 | Admitting: Family Medicine

## 2019-12-26 NOTE — Progress Notes (Signed)
[place]   [phone] [phone]   Clinic Follow up Note   Patient Care Team: Carollee Herter, Alferd Apa, DO as PCP - General (Family Medicine) Truitt Merle, MD as Consulting Physician (Hematology) Kyung Rudd, MD as Consulting Physician (Radiation Oncology) Tania Ade, RN as Registered Nurse Leighton Ruff, MD as Consulting Physician (General Surgery) Richmond Campbell, MD as Consulting Physician (Gastroenterology) Maisie Fus, MD as Consulting Physician (Obstetrics and Gynecology)  Date of Service:  12/28/2019  CHIEF COMPLAINT: F/u rectal cancer  SUMMARY OF ONCOLOGIC HISTORY: Oncology History Overview Note  Rectal cancer [facility])   Staging form: Colon and Rectum, AJCC 7th Edition     Clinical stage from 12/03/2015: Stage IIIB (T3, N1, M0) - Signed by Truitt Merle, MD on 12/18/2015     Rectal cancer ([place])  12/03/2015 Initial Diagnosis   Rectal cancer ([place])   12/03/2015 Procedure   Colonoscopy showed a partially obstructing tumor in the proximal rectum, its diameter measured 6 mm, biopsed   12/03/2015 Initial Biopsy   Rectum mass biopsy showed adenocarcinoma, moderately differentiated   12/10/2015 Imaging   CT abdomen and pelvis with contrast showed luminal narrowing and mural thickening in the proximal to mid rectum. There is a 6 mm short axis mesorectal lymph node suspicious for metastasis. There are several tiny indeterminate liver lesions    12/17/2015 Imaging   MRI pelvis with and without contrast showed a advanced T3 rectal adenocarcinoma (7cm), N1 disease (at least 2 left-sided nasal rectal lymph nodes are seen measuring 6-7 mm), distance from tumor to the sphincter is 8 cm   12/17/2015 Imaging   CT chest with contrast showed no evidence of metastasis or other changes   12/30/2015 - 02/08/2016 Radiation Therapy   neoadjuvant radiation to rectal cancer    12/30/2015 - 02/08/2016 Chemotherapy   xeloda 1500mg  twice daily with concurent radiation,  she tolerated well    03/15/2016 Imaging   CT AP IMPRESSION: 1. No acute findings within the abdomen or pelvis. No evidence for metastatic disease. 2. Mild wall thickening involving the distal sigmoid colon and rectum is identified. Nonspecific and may reflect sequelae of external beam radiation.   03/21/2016 Surgery   Rectosigmoid segmental resection    03/21/2016 Pathology Results   Invasive well differentiated adenocarcinoma, 5cm,  margins are negative, LVI (-), 1 out of 27 nodes were negative.     05/05/2016 - 05/05/2016 Adjuvant Chemotherapy   Adjuvant CAPOX, she had facial numbness and SOB after she completed the first oxaliplatin infusion, and refused more oxaliplatin    12/12/2016 Imaging   CT AP IMPRESSION: 1. Status post resection of rectal carcinoma. No complications identified and no specific features identified to suggest residual/recurrence of tumor along the suture line. 2. Stable tiny low-attenuation foci within the liver. This is too small to characterize. 3. No specific findings identified to suggest metastatic disease.   01/04/2018 Imaging   01/04/2018 CT Chest WO Contrast IMPRESSION: 1. Stable chest CT.  No findings of metastatic disease identified.  01/04/2018 MRI AP WO Contrast IMPRESSION: 1. Surgical changes within the high rectum. No evidence of metastatic disease. Mildly asymmetric soft tissue thickening in this area is likely treatment related. No evidence of locally recurrent disease on the colonoscopy of 2018. 2.  Aortic Atherosclerosis (ICD10-I70.0). 3. Mildly heterogeneous enhancement involving the sacrum is most likely related to prior radiation therapy   04/26/2019 Imaging   CT CAP W Contrast  IMPRESSION: 1. Postoperative findings in the rectum without evidence  of active malignancy. 2. 2 hypodense lesions in the spleen are stable and likely benign. 3.  Aortic Atherosclerosis (ICD10-I70.0). 4. Nonobstructive left nephrolithiasis.        CURRENT  THERAPY:  Surveillance  INTERVAL HISTORY:  Kaitlyn Morgan is here for a follow up of rectal cancer. She was last seen by me 8 months ago. She presents to the clinic alone. She notes she recently started her [facility] weight and wellness clinic. She has been able to lose some weight and with PT her right leg pain has improved. She saw dermatologist about her naval and it was related to GI and not her skin alone. When she cleans her naval she may have crust with blood. She notes she plans to have next colonoscopy 07/2020. Her last was in 07/2018 at [facility] with benign polyps removed. She notes she does not take  Fluid pill daily.    REVIEW OF SYSTEMS:   Constitutional: Denies fevers, chills (+) Purposeful weight loss Eyes: Denies blurriness of vision Ears, nose, mouth, throat, and face: Denies mucositis or sore throat Respiratory: Denies cough, dyspnea or wheezes Cardiovascular: Denies palpitation, chest discomfort or lower extremity swelling Gastrointestinal:  Denies nausea, heartburn or change in bowel habits Skin: Denies abnormal skin rashes  MSK: (+) Improved right hip pain  Lymphatics: Denies new lymphadenopathy or easy bruising Neurological:Denies numbness, tingling or new weaknesses Behavioral/Psych: Mood is stable, no new changes  All other systems were reviewed with the patient and are negative.  MEDICAL HISTORY:  Past Medical History:  Diagnosis Date  . Anemia    during pregnancy, not now.  . 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 Asthma   . Atypical syncope    1 episode -never had follow up with cardiology-never any further episodes, did go to urgent care.  . Back pain   . Colon cancer [facility])    rectal cancer Radiation and oral chemo with radiation.  . Complication of anesthesia   . Constipation   . Edema, lower extremity   . Family history of breast cancer   . Family history of uterine cancer   . Genital lesion, female    at  times  . Heart burn    occ.  Marland Kitchen Hx of seasonal allergies   . Joint pain   . Motion sickness   . PONV (postoperative nausea and vomiting)   . Rectal cancer ([place])   . Shortness of breath   . Swallowing difficulty   . Umbilicus discharge     SURGICAL HISTORY: Past Surgical History:  Procedure Laterality Date  . 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 CESAREAN SECTION    . COLON SURGERY    . DIAGNOSTIC LAPAROSCOPY     with D and C and Ovarian cystectomy, tubal ligation done at this time '00  . DILATION AND CURETTAGE OF UTERUS    . TUBAL LIGATION    . XI ROBOTIC ASSISTED LOWER ANTERIOR RESECTION N/A 03/21/2016   Procedure: XI ROBOTIC ASSISTED LOWER ANTERIOR RESECTION;  Surgeon: Leighton Ruff, MD;  Location: [facility];  Service: General;  Laterality: N/A;    I have reviewed the social history and family history with the patient and they are unchanged from previous note.  ALLERGIES:  is allergic to codeine; morphine and related; sucralose; and zolpidem tartrate.  MEDICATIONS:  Current Outpatient Medications  Medication Sig Dispense Refill  . aspirin EC 81 MG tablet Take 1 tablet (81 mg  total) by mouth daily. 30 tablet 0  . hydrochlorothiazide (HYDRODIURIL) 25 MG tablet Take 1 tablet (25 mg total) by mouth daily. 90 tablet 3  . Multiple Vitamins-Minerals (ADULT GUMMY) CHEW Chew 4-6 capsules by mouth daily.    . naproxen (NAPROSYN) 500 MG tablet Take 1 tablet (500 mg total) by mouth 2 (two) times daily. 30 tablet 0   No current facility-administered medications for this visit.    PHYSICAL EXAMINATION: ECOG PERFORMANCE STATUS: 1 - Symptomatic but completely ambulatory  Vitals:   12/27/19 1457  BP: 125/80  Pulse: 76  Resp: 18  Temp: 98.2 F (36.8 C)  SpO2: 100%   Filed Weights   12/27/19 1457  Weight: 199 lb 11 oz (90.6 kg)    GENERAL:alert, no distress and comfortable SKIN: skin color, texture, turgor are normal, no rashes or significant lesions  (+) Mild discharge from [employer], surgical incision healed well with mild scar tissue EYES: normal, Conjunctiva are pink and non-injected, sclera clear  NECK: supple, thyroid normal size, non-tender, without nodularity LYMPH:  no palpable lymphadenopathy in the cervical, axillary  LUNGS: clear to auscultation and percussion with normal breathing effort HEART: regular rate & rhythm and no murmurs and no lower extremity edema ABDOMEN:abdomen soft, non-tender and normal bowel sounds Musculoskeletal:no cyanosis of digits and no clubbing  NEURO: alert & oriented x 3 with fluent speech, no focal motor/sensory deficits  LABORATORY DATA:  I have reviewed the data as listed CBC Latest Ref Rng & Units 12/27/2019 10/29/2019 10/25/2019  WBC 4.0 - 10.5 K/uL 4.5 4.4 5.6  Hemoglobin 12.0 - 15.0 g/dL 14.2 14.1 13.3  Hematocrit 36.0 - 46.0 % 42.8 42.4 39.9  Platelets 150 - 400 K/uL 295 287 301     CMP Latest Ref Rng & Units 12/27/2019 10/29/2019 10/25/2019  Glucose 70 - 99 mg/dL 81 84 109(H)  BUN 6 - 20 mg/dL 19 13 10   Creatinine 0.44 - 1.00 mg/dL 1.03(H) 0.81 0.96  Sodium 135 - 145 mmol/L 140 143 142  Potassium 3.5 - 5.1 mmol/L 4.0 4.5 4.1  Chloride 98 - 111 mmol/L 100 104 104  CO2 22 - 32 mmol/L 27 25 29   Calcium 8.9 - 10.3 mg/dL 9.9 9.6 9.8  Total Protein 6.5 - 8.1 g/dL 8.2(H) 7.3 7.1  Total Bilirubin 0.3 - 1.2 mg/dL 1.1 0.7 0.9  Alkaline Phos 38 - 126 U/L 92 101 -  AST 15 - 41 U/L 24 20 17   ALT 0 - 44 U/L 14 11 13       RADIOGRAPHIC STUDIES: I have personally reviewed the radiological images as listed and agreed with the findings in the report. No results found.   ASSESSMENT & PLAN:  Kaitlyn Morgan is a 54 y.o. female with    1. Rectal adenocarcinoma, proximal rectum, cT3N1M0, ypT3N1aM0, stage IIIB, moderately differentiated -She was diagnosed on 12/03/2015. Shereceivedneoadjuvant chemo andradiation, finished 01/2016.  -Due to thehigh risk of cancer recurrence after surgery, I  previouslyrecommend adjuvant chemo but she declined. -Her last colonoscopy was in 07/2018 with benign polyps removed. She will be due in 07/2020.  -From a rectal cancer standpoint, she is clinically doing well. Lab reviewed, her CBC and CMP are within normal limits except Cr 1.03, Protein 8.2. Her CEA is still pending. Her physical exam stable. There is no clinical concern for recurrence. -She is 4 years since her cancer diagnosis, her risk of recurrence has significantly decreased.  -F/u in 1 year.    2. Cancer Screenings, Genetic testing was negative for  pathogenetic mutations -I encouraged her to stay up to date with age appropriate cancer screenings such as yearly mammograms with her Gyn and Pap smear every 2-3 years with her Gyn.  -Her last colonoscopy was in 02/2017.   3.Anxiety/Depression -She has high stress due to family issues -headaches may be related -Ipreviouslyreferred her to our social worker to start talking with someone.  [place] Discharge  -She has been treated with oral antibiotics but infection returned. Topical ointment did not work. She has been seen by Dermatologist who referred her back to GI as they suspect this is related to GI and not skin alone.  -She still has crusting with bleeding of naval. She keeps area clean. Naval appears clean on exam today with mild discharge (12/27/19). I do not have high suspicion this is related to her colon cancer.   5. Constipation  -Recent onset. She feels she cannot completely empty her bowels.  -She has not taken stool softener but plans to start with dulcolax. She did not tolerate Miralax well before.   6. Weight Management, right leg pain  -She notes she recently started her Healthy weight and wellness clinic. She has been able to lose some weight. She was placed on fluid pill.  -S/p PT her right leg pain has improved. She also follows up with [place].    Plan -Lab and f/u in 1 year   No  problem-specific Assessment & Plan notes found for this encounter.   Orders Placed This Encounter  Procedures  . CEA (IN HOUSE-[facility])   All questions were answered. The patient knows to call the clinic with any problems, questions or concerns. No barriers to learning was detected. The total time spent in the appointment was 30 minutes.     Truitt Merle, MD 12/28/2019   I, Joslyn Devon, am acting as scribe for Truitt Merle, MD.   I have reviewed the above documentation for accuracy and completeness, and I agree with the above.

## 2019-12-27 ENCOUNTER — Ambulatory Visit: Payer: 59 | Admitting: Hematology

## 2019-12-27 ENCOUNTER — Inpatient Hospital Stay (HOSPITAL_BASED_OUTPATIENT_CLINIC_OR_DEPARTMENT_OTHER): Payer: 59 | Admitting: Hematology

## 2019-12-27 ENCOUNTER — Other Ambulatory Visit: Payer: Self-pay

## 2019-12-27 ENCOUNTER — Inpatient Hospital Stay: Payer: 59 | Attending: Hematology

## 2019-12-27 ENCOUNTER — Encounter: Payer: Self-pay | Admitting: Hematology

## 2019-12-27 ENCOUNTER — Other Ambulatory Visit: Payer: 59

## 2019-12-27 VITALS — BP 125/80 | HR 76 | Temp 98.2°F | Resp 18 | Ht 62.0 in | Wt 199.7 lb

## 2019-12-27 DIAGNOSIS — F419 Anxiety disorder, unspecified: Secondary | ICD-10-CM | POA: Diagnosis not present

## 2019-12-27 DIAGNOSIS — C20 Malignant neoplasm of rectum: Secondary | ICD-10-CM

## 2019-12-27 DIAGNOSIS — Z9221 Personal history of antineoplastic chemotherapy: Secondary | ICD-10-CM | POA: Insufficient documentation

## 2019-12-27 DIAGNOSIS — K5900 Constipation, unspecified: Secondary | ICD-10-CM | POA: Diagnosis not present

## 2019-12-27 DIAGNOSIS — F329 Major depressive disorder, single episode, unspecified: Secondary | ICD-10-CM | POA: Diagnosis not present

## 2019-12-27 DIAGNOSIS — Z923 Personal history of irradiation: Secondary | ICD-10-CM | POA: Diagnosis not present

## 2019-12-27 DIAGNOSIS — M79604 Pain in right leg: Secondary | ICD-10-CM | POA: Diagnosis not present

## 2019-12-27 LAB — CBC WITH DIFFERENTIAL/PLATELET
Abs Immature Granulocytes: 0 10*3/uL (ref 0.00–0.07)
Basophils Absolute: 0 10*3/uL (ref 0.0–0.1)
Basophils Relative: 1 %
Eosinophils Absolute: 0.1 10*3/uL (ref 0.0–0.5)
Eosinophils Relative: 2 %
HCT: 42.8 % (ref 36.0–46.0)
Hemoglobin: 14.2 g/dL (ref 12.0–15.0)
Immature Granulocytes: 0 %
Lymphocytes Relative: 29 %
Lymphs Abs: 1.3 10*3/uL (ref 0.7–4.0)
MCH: 29.3 pg (ref 26.0–34.0)
MCHC: 33.2 g/dL (ref 30.0–36.0)
MCV: 88.4 fL (ref 80.0–100.0)
Monocytes Absolute: 0.3 10*3/uL (ref 0.1–1.0)
Monocytes Relative: 7 %
Neutro Abs: 2.8 10*3/uL (ref 1.7–7.7)
Neutrophils Relative %: 61 %
Platelets: 295 10*3/uL (ref 150–400)
RBC: 4.84 MIL/uL (ref 3.87–5.11)
RDW: 13.4 % (ref 11.5–15.5)
WBC: 4.5 10*3/uL (ref 4.0–10.5)
nRBC: 0 % (ref 0.0–0.2)

## 2019-12-27 LAB — COMPREHENSIVE METABOLIC PANEL
ALT: 14 U/L (ref 0–44)
AST: 24 U/L (ref 15–41)
Albumin: 4.5 g/dL (ref 3.5–5.0)
Alkaline Phosphatase: 92 U/L (ref 38–126)
Anion gap: 13 (ref 5–15)
BUN: 19 mg/dL (ref 6–20)
CO2: 27 mmol/L (ref 22–32)
Calcium: 9.9 mg/dL (ref 8.9–10.3)
Chloride: 100 mmol/L (ref 98–111)
Creatinine, Ser: 1.03 mg/dL — ABNORMAL HIGH (ref 0.44–1.00)
GFR calc Af Amer: >60 mL/min (ref >60)
GFR calc non Af Amer: >60 mL/min (ref >60)
Glucose, Bld: 81 mg/dL (ref 70–99)
Potassium: 4 mmol/L (ref 3.5–5.1)
Sodium: 140 mmol/L (ref 135–145)
Total Bilirubin: 1.1 mg/dL (ref 0.3–1.2)
Total Protein: 8.2 g/dL — ABNORMAL HIGH (ref 6.5–8.1)

## 2019-12-30 ENCOUNTER — Telehealth: Payer: Self-pay | Admitting: Hematology

## 2019-12-30 ENCOUNTER — Ambulatory Visit (INDEPENDENT_AMBULATORY_CARE_PROVIDER_SITE_OTHER): Payer: 59 | Admitting: Family Medicine

## 2019-12-30 ENCOUNTER — Other Ambulatory Visit: Payer: Self-pay

## 2019-12-30 VITALS — BP 112/75 | HR 70 | Temp 98.0°F | Ht 62.0 in | Wt 195.0 lb

## 2019-12-30 DIAGNOSIS — Z9189 Other specified personal risk factors, not elsewhere classified: Secondary | ICD-10-CM | POA: Diagnosis not present

## 2019-12-30 DIAGNOSIS — E559 Vitamin D deficiency, unspecified: Secondary | ICD-10-CM

## 2019-12-30 DIAGNOSIS — E8881 Metabolic syndrome: Secondary | ICD-10-CM | POA: Diagnosis not present

## 2019-12-30 DIAGNOSIS — M7070 Other bursitis of hip, unspecified hip: Secondary | ICD-10-CM | POA: Diagnosis not present

## 2019-12-30 DIAGNOSIS — Z6835 Body mass index (BMI) 35.0-35.9, adult: Secondary | ICD-10-CM

## 2019-12-30 LAB — CEA (IN HOUSE-CHCC): CEA (CHCC-In House): <1 ng/mL (ref 0.00–5.00)

## 2019-12-30 NOTE — Telephone Encounter (Signed)
Scheduled appt per 5/14 los.  Pt voice mailbox was full.  A calendar will be mailed out.

## 2019-12-31 ENCOUNTER — Encounter (INDEPENDENT_AMBULATORY_CARE_PROVIDER_SITE_OTHER): Payer: Self-pay | Admitting: Family Medicine

## 2019-12-31 NOTE — Progress Notes (Signed)
Chief Complaint:   OBESITY Kaitlyn Morgan is here to discuss her progress with her obesity treatment plan along with follow-up of her obesity related diagnoses. Kaitlyn Morgan is on the Category 1 Plan and states she is following her eating plan approximately 92% of the time. Kaitlyn Morgan states she is walking for 30 minutes 1 time a week and going to physical therapy for 60 minutes 2 times per week.  Today's visit was #: 4 Starting weight: 210 lbs Starting date: 10/29/2019 Today's weight: 195 lbs Today's date: 12/30/2019 Total lbs lost to date: 15 lbs Total lbs lost since last in-office visit: 11 lbs  Interim History:  Kaitlyn Morgan is down 19 pounds.  She reports that she sometimes skips a meal but passes up a snack.  Kaitlyn Morgan provided the following food recall today:  Breakfast:  2 scrambled eggs and 2 pieces of toast. Lunch:  [place] sandwich and berries. Dinner:  [place] over salad. Drinks:  Diet tea, Diet Dr. Malachi Bonds.  Subjective:   1. Vitamin D deficiency Kaitlyn Morgan's Vitamin D level was 35.9 on 10/29/2019. She is currently taking prescription vitamin D 50,000 IU each week. She denies nausea, vomiting or muscle weakness.  2. Insulin resistance Kaitlyn Morgan has a diagnosis of insulin resistance based on her elevated fasting insulin level >5. She continues to work on diet and exercise to decrease her risk of diabetes.  Lab Results  Component Value Date   INSULIN 14.7 10/29/2019   Lab Results  Component Value Date   HGBA1C 5.1 10/29/2019   3. Bursitis of hip, unspecified bursa, unspecified laterality Kaitlyn Morgan will see Dr. Georgina Snell next week.  She is interested in an injection.  4. At risk for deficient intake of food The patient is at a higher than average risk of deficient intake of food.  Assessment/Plan:   1. Vitamin D deficiency Low Vitamin D level contributes to fatigue and are associated with obesity, breast, and colon cancer. She agrees to continue to take prescription Vitamin D @50 ,000 IU  every week and will follow-up for routine testing of Vitamin D, at least 2-3 times per year to avoid over-replacement.  2. Insulin resistance Kaitlyn Morgan will continue to work on weight loss, exercise, and decreasing simple carbohydrates to help decrease the risk of diabetes. Kaitlyn Morgan agreed to follow-up with [place] as directed to closely monitor her progress.  3. Bursitis of hip, unspecified bursa, unspecified laterality Continue PT.  4. At risk for deficient intake of food Kaitlyn Morgan was given approximately 15 minutes of deficit intake of food prevention counseling today. Kaitlyn Morgan is at risk for eating too few calories based on current food recall. She was encouraged to focus on meeting caloric and protein goals according to her recommended meal plan.   5. Class 2 severe obesity with serious comorbidity and body mass index (BMI) of 35.0 to 35.9 in adult, unspecified obesity type (HCC) Kaitlyn Morgan is currently in the action stage of change. As such, her goal is to continue with weight loss efforts. She has agreed to the Category 1 Plan.   Exercise goals: As is.  Behavioral modification strategies: increasing water intake.  Kaitlyn Morgan has agreed to follow-up with our clinic in 2 weeks. She was informed of the importance of frequent follow-up visits to maximize her success with intensive lifestyle modifications for her multiple health conditions.   Objective:   Blood pressure 112/75, pulse 70, temperature 98 F (36.7 C), height 5\' 2"  (1.575 m), weight 195 lb (88.5 kg), SpO2 100 %. Body mass index is 35.67 kg/m.  General: Cooperative, alert, well developed, in no acute distress. HEENT: Conjunctivae and lids unremarkable. Cardiovascular: Regular rhythm.  Lungs: Normal work of breathing. Neurologic: No focal deficits.   Lab Results  Component Value Date   CREATININE 1.03 (H) 12/27/2019   BUN 19 12/27/2019   NA 140 12/27/2019   K 4.0 12/27/2019   CL 100 12/27/2019   CO2 27 12/27/2019   Lab Results    Component Value Date   ALT 14 12/27/2019   AST 24 12/27/2019   ALKPHOS 92 12/27/2019   BILITOT 1.1 12/27/2019   Lab Results  Component Value Date   HGBA1C 5.1 10/29/2019   HGBA1C 5.0 03/14/2016   Lab Results  Component Value Date   INSULIN 14.7 10/29/2019   Lab Results  Component Value Date   TSH 3.860 10/29/2019   Lab Results  Component Value Date   CHOL 167 10/29/2019   HDL 47 10/29/2019   LDLCALC 98 10/29/2019   TRIG 124 10/29/2019   CHOLHDL 3.6 10/29/2019   Lab Results  Component Value Date   WBC 4.5 12/27/2019   HGB 14.2 12/27/2019   HCT 42.8 12/27/2019   MCV 88.4 12/27/2019   PLT 295 12/27/2019   Lab Results  Component Value Date   IRON 50 10/29/2019   TIBC 361 10/29/2019   FERRITIN 43 10/29/2019   Attestation Statements:   Reviewed by clinician on day of visit: allergies, medications, problem list, medical history, surgical history, family history, social history, and previous encounter notes.  I, Water quality scientist, CMA, am acting as Location manager for [employer], DO.  I have reviewed the above documentation for accuracy and completeness, and I agree with the above. Briscoe Deutscher, DO

## 2020-01-10 ENCOUNTER — Other Ambulatory Visit: Payer: Self-pay

## 2020-01-10 ENCOUNTER — Encounter: Payer: Self-pay | Admitting: Family Medicine

## 2020-01-10 ENCOUNTER — Ambulatory Visit (INDEPENDENT_AMBULATORY_CARE_PROVIDER_SITE_OTHER): Payer: 59 | Admitting: Family Medicine

## 2020-01-10 VITALS — BP 114/78 | HR 66 | Ht 62.0 in | Wt 198.0 lb

## 2020-01-10 DIAGNOSIS — R29898 Other symptoms and signs involving the musculoskeletal system: Secondary | ICD-10-CM

## 2020-01-10 DIAGNOSIS — M7062 Trochanteric bursitis, left hip: Secondary | ICD-10-CM | POA: Diagnosis not present

## 2020-01-10 DIAGNOSIS — M7061 Trochanteric bursitis, right hip: Secondary | ICD-10-CM | POA: Diagnosis not present

## 2020-01-10 DIAGNOSIS — M899 Disorder of bone, unspecified: Secondary | ICD-10-CM | POA: Diagnosis not present

## 2020-01-10 NOTE — Progress Notes (Signed)
I, Wendy Poet, LAT, ATC, am serving as scribe for Dr. Lynne Leader.  Kaitlyn Morgan is a 54 y.o. female who presents to [place] at [facility] today for f/u of B hip pain.  She was last seen by Dr. Georgina Snell on 11/01/19 after having pain in her hips for approximately one year that began after having radiation.  She was referred to outpatient PT and completed PT in [place], [place].  Since her last visit, pt reports that she is feeling better.  She notes that she started a diuretic.  She states that she's started going to the wellness clinic to help w/ her weight.  She states that she feels like her strength has improved in her B hips.  Diagnostic testing: B hip XR- 11/01/19; R femur XR- 11/05/19   Pertinent review of systems: No fevers or chills  Relevant historical information: History of rectal cancer with external beam radiation   Exam:  BP 114/78 (BP Location: Right Arm, Patient Position: Sitting, Cuff Size: Large)   Pulse 66   Ht 5\' 2"  (1.575 m)   Wt 198 lb (89.8 kg)   SpO2 98%   BMI 36.21 kg/m  General: Well Developed, well nourished, and in no acute distress.   MSK: Right hip normal motion. Right upper leg appearing nontender    Lab and Radiology Results DG FEMUR, MIN 2 VIEWS RIGHT  Result Date: 11/06/2019 CLINICAL DATA:  Bilateral hip and upper leg pain. Abnormal radiograph of the hips and pelvis on 11/01/2019. EXAM: RIGHT FEMUR 2 VIEWS COMPARISON:  Radiographs dated 11/01/2019 FINDINGS: There is an irregular sclerotic lesion in the mid right femoral shaft extending over a 7.6 cm area consistent with a benign bone infarct. The right femur otherwise appears normal. No significant abnormality of the right hip joint or right knee joint. 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 Otherwise, normal exam. Electronically Signed   By: Lorriane Shire M.D.   On: 11/06/2019 10:56   DG HIPS BILAT WITH PELVIS 3-4 VIEWS  Result Date:  11/01/2019 CLINICAL DATA:  Pain. History of rectal cancer and radiation therapy. EXAM: DG HIP (WITH OR WITHOUT PELVIS) 3-4V BILAT COMPARISON:  None. FINDINGS: There is no evidence of hip fracture or dislocation. Normal bilateral hip joints and pubic symphysis. Tiny degenerative bone spurs at the bilateral sacroiliac joints. Mild disc space narrowing and degenerative endplate changes at 075-GRM, more pronounced on the left along the lumbar dextrocurvature. Short right leg on weight-bearing view with forward tilt of the left hemipelvis, and the right iliac crest projecting a cm higher than the contralateral side. 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 Normal bilateral spherical femoral heads without geode formation or subchondral sclerosis. Partial imaging of an elongated mixed lytic sclerotic lesion in the right femur midshaft measuring 8.5 by 1.7 cm, probably a benign bone infarction. Low anterior pelvis surgical sutures. IMPRESSION: Negative for acute bony injury. Left protrusio acetabuli. 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 Confirmatory imaging with MR proximal femoral shaft without and with intravenous contrast could be considered given the history of previously treated malignancy. Dedicated right femur radiography recommended. Surveillance radiography of the right femur recommended in 6 months. Mild degenerative change at the L4-L5 lumbar spine with short right leg and lumbar dextrocurvature on weight-bearing view. Electronically Signed   By: Revonda Humphrey   On: 11/01/2019 15:53   VAS [place] LOWER EXTREMITY VENOUS REFLUX  Result Date: 12/06/2019  Lower Venous Reflux Study Indications: Swelling.  Performing Technologist: Caralee Ates BA, RVT, RDMS  Examination Guidelines: A complete evaluation includes B-mode imaging, spectral Doppler, color Doppler, and power Doppler as needed of all accessible portions of each vessel.  Bilateral testing is considered an integral part of a complete examination. Limited examinations for reoccurring indications may be performed as noted. The reflux portion of the exam is performed with the patient in reverse Trendelenburg. Significant venous reflux is defined as >500 ms in the superficial venous system, and >1 second in the deep venous system.  Venous Reflux Times +--------------+---------+------+-----------+------------+--------+ RIGHT         Reflux NoRefluxReflux TimeDiameter cmsComments                         Yes                                  +--------------+---------+------+-----------+------------+--------+ CFV           no                                             +--------------+---------+------+-----------+------------+--------+ FV mid        no                                             +--------------+---------+------+-----------+------------+--------+ Popliteal     no                                             +--------------+---------+------+-----------+------------+--------+ GSV at SFJ    no                           0.742             +--------------+---------+------+-----------+------------+--------+ GSV prox thighno                           0.493             +--------------+---------+------+-----------+------------+--------+ GSV mid thigh no                           0.451             +--------------+---------+------+-----------+------------+--------+ GSV dist thighno                           0.444             +--------------+---------+------+-----------+------------+--------+ GSV at knee   no                           0.506             +--------------+---------+------+-----------+------------+--------+ GSV prox calf no                           0.321             +--------------+---------+------+-----------+------------+--------+  GSV mid calf  no                           0.191              +--------------+---------+------+-----------+------------+--------+ GSV dist calf no                           0.203             +--------------+---------+------+-----------+------------+--------+ SSV Pop Fossa no                           0.400             +--------------+---------+------+-----------+------------+--------+ SSV prox calf no                           0.146             +--------------+---------+------+-----------+------------+--------+ SSV mid calf  no                           0.274             +--------------+---------+------+-----------+------------+--------+   Summary: Right: - No evidence of deep vein thrombosis seen in the right lower extremity, from the common femoral through the popliteal veins. - No evidence of superficial venous thrombosis in the right lower extremity. - There is no evidence of venous reflux seen in the right lower extremity.  *See table(s) above for measurements and observations. Electronically signed by Servando Snare MD on 12/06/2019 at 2:51:47 PM.    Final    I, Lynne Leader, personally (independently) visualized and performed the interpretation of the xray images attached in this note.     Assessment and Plan: 54 y.o. female with right hip and leg pain.  Predominantly due to hip abductor weakness improving with physical therapy.  However patient continues to experience thigh pain and dysfunction.  Recent x-ray does show abnormal change in mid femur concerning for bone infarct.  However given her relatively recent cancer history I think it is reasonable to proceed with MRI of the femur to further evaluate this.  We will proceed with and without contrast. Additionally patient likely does have some abnormal sensation in her leg secondary to radiation as well.  She describes lack of proprioception or abnormal proprioception.  Following MRI if not further characterize may consider nerve conduction study. Additionally happy to proceed with  trial of greater trochanter injection in the future as well.  Recheck after MRI.   PDMP not reviewed this encounter. Orders Placed This Encounter  Procedures  . MR FEMUR RIGHT W WO CONTRAST    Standing Status:   Future    Standing Expiration Date:   01/09/2021    Order Specific Question:   If indicated for the ordered procedure, I authorize the administration of contrast media per Radiology protocol    Answer:   Yes    Order Specific Question:   What is the patient's sedation requirement?    Answer:   No Sedation    Order Specific Question:   Does the patient have a pacemaker or implanted devices?    Answer:   No    Order Specific Question:   Radiology Contrast Protocol - do NOT remove file path    Answer:   [email or web address]  Order Specific Question:   Preferred imaging location?    Answer:   [facility] (table limit-550lbs)   No orders of the defined types were placed in this encounter.    Discussed warning signs or symptoms. Please see discharge instructions. Patient expresses understanding.   The above documentation has been reviewed and is accurate and complete Lynne Leader, M.D.

## 2020-01-10 NOTE — Patient Instructions (Signed)
Thank you for coming in today. Plan for MRI.  Recheck after MRI.  Let me know if you do not hear about scheduled.  Could do nerve test or injection in the future if needed.  Continue exercises.

## 2020-01-27 ENCOUNTER — Ambulatory Visit (INDEPENDENT_AMBULATORY_CARE_PROVIDER_SITE_OTHER): Payer: 59 | Admitting: Family Medicine

## 2020-02-10 ENCOUNTER — Ambulatory Visit
Admission: RE | Admit: 2020-02-10 | Discharge: 2020-02-10 | Disposition: A | Discharge status: Home / Self Care | Payer: 59 | Source: Ambulatory Visit | Attending: Family Medicine | Admitting: Family Medicine

## 2020-02-10 DIAGNOSIS — M899 Disorder of bone, unspecified: Secondary | ICD-10-CM

## 2020-02-10 MED ORDER — GADOBENATE DIMEGLUMINE 529 MG/ML IV SOLN
18.0000 mL | Freq: Once | INTRAVENOUS | Status: AC | PRN
  Administered 2020-02-10: 18 mL via INTRAVENOUS

## 2020-02-11 NOTE — Progress Notes (Signed)
MRI femur does not show cancer. It looks like a bone island which is a benign condition not causing your pain.  Return as needed for pain or to discuss this further.

## 2020-02-13 ENCOUNTER — Other Ambulatory Visit: Payer: Self-pay

## 2020-02-13 ENCOUNTER — Encounter (INDEPENDENT_AMBULATORY_CARE_PROVIDER_SITE_OTHER): Payer: Self-pay | Admitting: Family Medicine

## 2020-02-13 ENCOUNTER — Ambulatory Visit (INDEPENDENT_AMBULATORY_CARE_PROVIDER_SITE_OTHER): Payer: 59 | Admitting: Family Medicine

## 2020-02-13 VITALS — BP 113/75 | HR 70 | Temp 98.2°F | Ht 62.0 in | Wt 188.0 lb

## 2020-02-13 DIAGNOSIS — E8881 Metabolic syndrome: Secondary | ICD-10-CM

## 2020-02-13 DIAGNOSIS — Z9189 Other specified personal risk factors, not elsewhere classified: Secondary | ICD-10-CM | POA: Diagnosis not present

## 2020-02-13 DIAGNOSIS — E559 Vitamin D deficiency, unspecified: Secondary | ICD-10-CM | POA: Diagnosis not present

## 2020-02-13 DIAGNOSIS — M7061 Trochanteric bursitis, right hip: Secondary | ICD-10-CM | POA: Diagnosis not present

## 2020-02-13 DIAGNOSIS — E669 Obesity, unspecified: Secondary | ICD-10-CM

## 2020-02-13 DIAGNOSIS — M7062 Trochanteric bursitis, left hip: Secondary | ICD-10-CM

## 2020-02-13 DIAGNOSIS — Z6834 Body mass index (BMI) 34.0-34.9, adult: Secondary | ICD-10-CM

## 2020-02-19 NOTE — Progress Notes (Signed)
Chief Complaint:   OBESITY Kaitlyn Morgan is here to discuss her progress with her obesity treatment plan along with follow-up of her obesity related diagnoses. Kaitlyn Morgan is on the Category 1 Plan and states she is following her eating plan approximately 50-60% of the time. Kaitlyn Morgan states she is doing cardio and walking 2-3 times per week.  Today's visit was #: 5 Starting weight: 210 lbs Starting date: 10/29/2019 Today's weight: 188 lbs Today's date: 02/13/2020 Total lbs lost to date: 22 lbs Total lbs lost since last in-office visit: 7 lbs  Interim History: Kaitlyn Morgan says she has been enjoying PT for her hips.  She feels stronger and has decreased pain.  Subjective:   1. Insulin resistance Kaitlyn Morgan has a diagnosis of insulin resistance based on her elevated fasting insulin level >5. She continues to work on diet and exercise to decrease her risk of diabetes.  Lab Results  Component Value Date   INSULIN 14.7 10/29/2019   Lab Results  Component Value Date   HGBA1C 5.1 10/29/2019   2. Vitamin D deficiency Kaitlyn Morgan's Vitamin D level was 35.9 on 10/29/2019. She is currently taking prescription vitamin D 50,000 IU each week. She denies nausea, vomiting or muscle weakness.  3. Trochanteric bursitis of both hips Associated with weakness.  She is enjoying PT and her increased strength.  Assessment/Plan:   1. Insulin resistance Kaitlyn Morgan will continue to work on weight loss, exercise, and decreasing simple carbohydrates to help decrease the risk of diabetes. Kaitlyn Morgan agreed to follow-up with [place] as directed to closely monitor her progress.  2. Vitamin D deficiency Low Vitamin D level contributes to fatigue and are associated with obesity, breast, and colon cancer. She agrees to continue to take prescription Vitamin D @50 ,000 IU every week and will follow-up for routine testing of Vitamin D, at least 2-3 times per year to avoid over-replacement.  3. Trochanteric bursitis of both hips Will continue  to follow.  4. At risk for activity intolerance Kaitlyn Morgan was given approximately 15 minutes of exercise intolerance counseling today. She is 54 y.o. female and has risk factors exercise intolerance including obesity. We discussed intensive lifestyle modifications today with an emphasis on specific weight loss instructions and strategies. Kaitlyn Morgan will slowly increase activity as tolerated.  Repetitive spaced learning was employed today to elicit superior memory formation and behavioral change.  5. Class 1 obesity with serious comorbidity and body mass index (BMI) of 34.0 to 34.9 in adult, unspecified obesity type Kaitlyn Morgan is currently in the action stage of change. As such, her goal is to continue with weight loss efforts. She has agreed to the Category 1 Plan.   Exercise goals: For substantial health benefits, adults should do at least 150 minutes (2 hours and 30 minutes) a week of moderate-intensity, or 75 minutes (1 hour and 15 minutes) a week of vigorous-intensity aerobic physical activity, or an equivalent combination of moderate- and vigorous-intensity aerobic activity. Aerobic activity should be performed in episodes of at least 10 minutes, and preferably, it should be spread throughout the week.  Behavioral modification strategies: increasing lean protein intake.  Kaitlyn Morgan has agreed to follow-up with our clinic in 4 weeks. She was informed of the importance of frequent follow-up visits to maximize her success with intensive lifestyle modifications for her multiple health conditions.   Objective:   Blood pressure 113/75, pulse 70, temperature 98.2 F (36.8 C), temperature source Oral, height 5\' 2"  (1.575 m), weight 188 lb (85.3 kg), SpO2 97 %. Body mass index is  34.39 kg/m.  General: Cooperative, alert, well developed, in no acute distress. HEENT: Conjunctivae and lids unremarkable. Cardiovascular: Regular rhythm.  Lungs: Normal work of breathing. Neurologic: No focal deficits.   Lab  Results  Component Value Date   CREATININE 1.03 (H) 12/27/2019   BUN 19 12/27/2019   NA 140 12/27/2019   K 4.0 12/27/2019   CL 100 12/27/2019   CO2 27 12/27/2019   Lab Results  Component Value Date   ALT 14 12/27/2019   AST 24 12/27/2019   ALKPHOS 92 12/27/2019   BILITOT 1.1 12/27/2019   Lab Results  Component Value Date   HGBA1C 5.1 10/29/2019   HGBA1C 5.0 03/14/2016   Lab Results  Component Value Date   INSULIN 14.7 10/29/2019   Lab Results  Component Value Date   TSH 3.860 10/29/2019   Lab Results  Component Value Date   CHOL 167 10/29/2019   HDL 47 10/29/2019   LDLCALC 98 10/29/2019   TRIG 124 10/29/2019   CHOLHDL 3.6 10/29/2019   Lab Results  Component Value Date   WBC 4.5 12/27/2019   HGB 14.2 12/27/2019   HCT 42.8 12/27/2019   MCV 88.4 12/27/2019   PLT 295 12/27/2019   Lab Results  Component Value Date   IRON 50 10/29/2019   TIBC 361 10/29/2019   FERRITIN 43 10/29/2019   Attestation Statements:   Reviewed by clinician on day of visit: allergies, medications, problem list, medical history, surgical history, family history, social history, and previous encounter notes.  I, Water quality scientist, CMA, am acting as transcriptionist for Briscoe Deutscher, DO  I have reviewed the above documentation for accuracy and completeness, and I agree with the above. Briscoe Deutscher, DO

## 2020-02-27 ENCOUNTER — Encounter (INDEPENDENT_AMBULATORY_CARE_PROVIDER_SITE_OTHER): Payer: Self-pay | Admitting: Family Medicine

## 2020-03-11 ENCOUNTER — Ambulatory Visit (INDEPENDENT_AMBULATORY_CARE_PROVIDER_SITE_OTHER): Payer: 59 | Admitting: Family Medicine

## 2020-03-30 ENCOUNTER — Ambulatory Visit (INDEPENDENT_AMBULATORY_CARE_PROVIDER_SITE_OTHER): Payer: 59 | Admitting: Family Medicine

## 2020-03-30 ENCOUNTER — Other Ambulatory Visit: Payer: Self-pay

## 2020-03-30 VITALS — BP 108/74 | HR 76 | Temp 98.2°F | Ht 62.0 in | Wt 181.0 lb

## 2020-03-30 DIAGNOSIS — E66811 Obesity, class 1: Secondary | ICD-10-CM

## 2020-03-30 DIAGNOSIS — E8881 Metabolic syndrome: Secondary | ICD-10-CM

## 2020-03-30 DIAGNOSIS — Z85048 Personal history of other malignant neoplasm of rectum, rectosigmoid junction, and anus: Secondary | ICD-10-CM

## 2020-03-30 DIAGNOSIS — Z6833 Body mass index (BMI) 33.0-33.9, adult: Secondary | ICD-10-CM

## 2020-03-30 DIAGNOSIS — E88819 Insulin resistance, unspecified: Secondary | ICD-10-CM

## 2020-03-30 DIAGNOSIS — E559 Vitamin D deficiency, unspecified: Secondary | ICD-10-CM

## 2020-03-30 DIAGNOSIS — K5909 Other constipation: Secondary | ICD-10-CM

## 2020-03-30 DIAGNOSIS — E669 Obesity, unspecified: Secondary | ICD-10-CM

## 2020-03-31 ENCOUNTER — Encounter (INDEPENDENT_AMBULATORY_CARE_PROVIDER_SITE_OTHER): Payer: Self-pay | Admitting: Family Medicine

## 2020-03-31 NOTE — Progress Notes (Signed)
Chief Complaint:   OBESITY Nakiya is here to discuss her progress with her obesity treatment plan along with follow-up of her obesity related diagnoses. Kemiyah is on the Category 1 Plan and states she is following her eating plan approximately 50% of the time. Ileanna states she is doing PT for 15 minutes 2-3 times per week.  Today's visit was #: 6 Starting weight: 210 lbs Starting date: 10/29/2019 Today's weight: 181 lbs Today's date: 03/30/2020 Total lbs lost to date: 29 lbs Total lbs lost since last in-office visit: 7 lbs  Interim History: Cassiopeia was previously seen by my colleague, Dr. Juleen China.  Her last office visit with her was on 02/13/2020.  This is her first office visit with me.  She is down a total of 29 pounds.  She says that last week she went on an "egg diet".  She eats 2 eggs three times daily and string cheese in between.  She says her cravings and hunger are well-controlled and Category 1 is not difficult to follow.  Subjective:   1. Insulin resistance Vanita has a diagnosis of insulin resistance based on her elevated fasting insulin level >5. She continues to work on diet and exercise to decrease her risk of diabetes.  Fasting insulin was 14.7 about 5 months ago with Dr. Juleen China.  She has lost almost 30 pounds since then.  Lab Results  Component Value Date   INSULIN 14.7 10/29/2019   Lab Results  Component Value Date   HGBA1C 5.1 10/29/2019   2. Chronic constipation Brittain notes constipation.  She says she could not tolerate MiraLAX, but her PCP put her on Dulcolax.  She states this has been a problem since she was diagnosed with rectal cancer.  She has not been taking a stool softener regularly.  3. History of rectal cancer Diagnosed in 2017.  She is under the care of Oncology, Dr. Burr Medico.  Status post chemotherapy and radiation, which she finished 6/17.  4. Vitamin D deficiency Unique's Vitamin D level was 35.9 on 10/29/2019. She is currently taking no  vitamin D supplement. She denies nausea, vomiting or muscle weakness.  She was on an OTC supplement in the past.  Assessment/Plan:   1. Insulin resistance Lavonya will continue to work on weight loss, exercise, and decreasing simple carbohydrates to help decrease the risk of diabetes. Evonda agreed to follow-up with [place] as directed to closely monitor her progress.  She will need labs at her next visit to reassess after her significant (>10%) weight loss.  Continue prudent nutritional plan, weight loss.  2. Chronic constipation Arlys was informed that a decrease in bowel movement frequency is normal while losing weight, but stools should not be hard or painful. Orders and follow up as documented in patient record.  Symptoms are stable currently.  Recommend she take Dulcolax as instructed by past doctors that have seen her.  Increase water intake, increase activity, and follow-up with GI.  Counseling Getting to Good Bowel Health: Your goal is to have one soft bowel movement each day. Drink at least 8 glasses of water each day. Eat plenty of fiber (goal is over 25 grams each day). It is best to get most of your fiber from dietary sources which includes leafy green vegetables, fresh fruit, and whole grains. You may need to add fiber with the help of OTC fiber supplements. These include Metamucil, Citrucel, and Flaxseed. If you are still having trouble, try adding Miralax or Magnesium Citrate. If all of  these changes do not work, Cabin crew.  3. History of rectal cancer Aleane will follow-up with Dr. Burr Medico as instructed by Oncology.  4. Vitamin D deficiency Low Vitamin D level contributes to fatigue and are associated with obesity, breast, and colon cancer. She will take an OTC supplement as instructed in the past.  May consider rechecking in the near future since it has been 4+ months since the last value was drawn.  Continue prudent nutritional plan and weight loss.  5. Class 1 obesity  with serious comorbidity and body mass index (BMI) of 33.0 to 33.9 in adult, unspecified obesity type Greenley is currently in the action stage of change. As such, her goal is to continue with weight loss efforts. She has agreed to the Category 1 Plan.  No change in plan, but she is encouraged to get back on the plan for 2 weeks and follow-up with Dr. Juleen China.  She was given handouts.   Exercise goals: As is.  Behavioral modification strategies: increasing lean protein intake, decreasing simple carbohydrates, increasing vegetables, increasing water intake, no skipping meals and planning for success.  Blanchie has agreed to follow-up with our clinic in 2-3 weeks with Dr. Juleen China. She was informed of the importance of frequent follow-up visits to maximize her success with intensive lifestyle modifications for her multiple health conditions.   Objective:   Blood pressure 108/74, pulse 76, temperature 98.2 F (36.8 C), height 5\' 2"  (1.575 m), weight 181 lb (82.1 kg), SpO2 97 %. Body mass index is 33.11 kg/m.  General: Cooperative, alert, well developed, in no acute distress. HEENT: Conjunctivae and lids unremarkable. Cardiovascular: Regular rhythm.  Lungs: Normal work of breathing. Neurologic: No focal deficits.   Lab Results  Component Value Date   CREATININE 1.03 (H) 12/27/2019   BUN 19 12/27/2019   NA 140 12/27/2019   K 4.0 12/27/2019   CL 100 12/27/2019   CO2 27 12/27/2019   Lab Results  Component Value Date   ALT 14 12/27/2019   AST 24 12/27/2019   ALKPHOS 92 12/27/2019   BILITOT 1.1 12/27/2019   Lab Results  Component Value Date   HGBA1C 5.1 10/29/2019   HGBA1C 5.0 03/14/2016   Lab Results  Component Value Date   INSULIN 14.7 10/29/2019   Lab Results  Component Value Date   TSH 3.860 10/29/2019   Lab Results  Component Value Date   CHOL 167 10/29/2019   HDL 47 10/29/2019   LDLCALC 98 10/29/2019   TRIG 124 10/29/2019   CHOLHDL 3.6 10/29/2019   Lab Results    Component Value Date   WBC 4.5 12/27/2019   HGB 14.2 12/27/2019   HCT 42.8 12/27/2019   MCV 88.4 12/27/2019   PLT 295 12/27/2019   Lab Results  Component Value Date   IRON 50 10/29/2019   TIBC 361 10/29/2019   FERRITIN 43 10/29/2019   Attestation Statements:   Reviewed by clinician on day of visit: allergies, medications, problem list, medical history, surgical history, family history, social history, and previous encounter notes.  Time spent on visit including pre-visit chart review and post-visit care and charting was 25 minutes.   I, Water quality scientist, CMA, am acting as Location manager for [employer], DO.  I have reviewed the above documentation for accuracy and completeness, and I agree with the above. Mellody Dance, DO

## 2020-04-27 ENCOUNTER — Other Ambulatory Visit: Payer: Self-pay

## 2020-04-27 ENCOUNTER — Ambulatory Visit (INDEPENDENT_AMBULATORY_CARE_PROVIDER_SITE_OTHER): Payer: 59 | Admitting: Family Medicine

## 2020-04-27 ENCOUNTER — Encounter (INDEPENDENT_AMBULATORY_CARE_PROVIDER_SITE_OTHER): Payer: Self-pay | Admitting: Family Medicine

## 2020-04-27 VITALS — BP 108/73 | HR 62 | Temp 98.1°F | Ht 62.0 in | Wt 183.0 lb

## 2020-04-27 DIAGNOSIS — E669 Obesity, unspecified: Secondary | ICD-10-CM | POA: Diagnosis not present

## 2020-04-27 DIAGNOSIS — R600 Localized edema: Secondary | ICD-10-CM

## 2020-04-27 DIAGNOSIS — Z6833 Body mass index (BMI) 33.0-33.9, adult: Secondary | ICD-10-CM | POA: Diagnosis not present

## 2020-04-27 DIAGNOSIS — R198 Other specified symptoms and signs involving the digestive system and abdomen: Secondary | ICD-10-CM | POA: Diagnosis not present

## 2020-04-28 MED ORDER — FLUCONAZOLE 150 MG PO TABS: ORAL_TABLET | ORAL | 0 refills | Status: DC

## 2020-04-29 NOTE — Progress Notes (Signed)
Chief Complaint:   OBESITY Kaitlyn Morgan is here to discuss her progress with her obesity treatment plan along with follow-up of her obesity related diagnoses. Kaitlyn Morgan is on the Category 1 Plan and states she is following her eating plan approximately a small amount of the time.  Kaitlyn Morgan states she is exercising for 0 minutes 0 times per week.  Today's visit was #: 7 Starting weight: 210 lbs Starting date: 10/29/2019 Today's weight: 183 lbs Today's date: 04/27/2020 Total lbs lost to date: 27 lbs Total lbs lost since last in-office visit: 0 Total weight loss percentage to date: -12.86%  Interim History: Kaitlyn Morgan says she got a tooth implant last week.  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 She had a significant increase in edema.  She is now back on her medication. Umbilical DC has worsened, now white, without blood or itch/pain. No new systemic symptoms. We had previously discussed having her f/u with GI, but she has not called them yet. She is feeling more hip pain again, so we reviewed the importance of continuing PT recommendations.  Assessment/Plan:   1. Umbilical discharge Discharge is white in color.  History of rectal cancer.  Known chronic umbilical discharge.  She has been waiting to see her GI doctor. I will go ahead and provide diflucan to make sure that this isn't yeast. Will continue to monitor symptoms as they relate to her weight loss journey.  - fluconazole (DIFLUCAN) 150 MG tablet; Take 1 tablet by mouth today and 1 tablet by mouth in 7 days  Dispense: 2 tablet; Refill: 0  2. Lower extremity edema Improving. Continue HCTZ. Labs at next visit.   3. Class 1 obesity with serious comorbidity and body mass index (BMI) of 33.0 to 33.9 in adult, unspecified obesity type Kaitlyn Morgan is currently in the action stage of change. As such, her goal is to continue with weight loss efforts. She has agreed to the Category 1 Plan.   Exercise goals: For  substantial health benefits, adults should do at least 150 minutes (2 hours and 30 minutes) a week of moderate-intensity, or 75 minutes (1 hour and 15 minutes) a week of vigorous-intensity aerobic physical activity, or an equivalent combination of moderate- and vigorous-intensity aerobic activity. Aerobic activity should be performed in episodes of at least 10 minutes, and preferably, it should be spread throughout the week.  Behavioral modification strategies: increasing lean protein intake and increasing water intake.  Kaitlyn Morgan has agreed to follow-up with our clinic in 3 weeks. She was informed of the importance of frequent follow-up visits to maximize her success with intensive lifestyle modifications for her multiple health conditions.   Objective:   Blood pressure 108/73, pulse 62, temperature 98.1 F (36.7 C), temperature source Oral, height 5\' 2"  (1.575 m), weight 183 lb (83 kg), SpO2 98 %. Body mass index is 33.47 kg/m.  General: Cooperative, alert, well developed, in no acute distress. HEENT: Conjunctivae and lids unremarkable. Cardiovascular: Regular rhythm.  Lungs: Normal work of breathing. Neurologic: No focal deficits.   Lab Results  Component Value Date   CREATININE 1.03 (H) 12/27/2019   BUN 19 12/27/2019   NA 140 12/27/2019   K 4.0 12/27/2019   CL 100 12/27/2019   CO2 27 12/27/2019   Lab Results  Component Value Date   ALT 14 12/27/2019   AST 24 12/27/2019   ALKPHOS 92 12/27/2019   BILITOT 1.1 12/27/2019   Lab Results  Component Value  Date   HGBA1C 5.1 10/29/2019   HGBA1C 5.0 03/14/2016   Lab Results  Component Value Date   INSULIN 14.7 10/29/2019   Lab Results  Component Value Date   TSH 3.860 10/29/2019   Lab Results  Component Value Date   CHOL 167 10/29/2019   HDL 47 10/29/2019   LDLCALC 98 10/29/2019   TRIG 124 10/29/2019   CHOLHDL 3.6 10/29/2019   Lab Results  Component Value Date   WBC 4.5 12/27/2019   HGB 14.2 12/27/2019   HCT 42.8  12/27/2019   MCV 88.4 12/27/2019   PLT 295 12/27/2019   Lab Results  Component Value Date   IRON 50 10/29/2019   TIBC 361 10/29/2019   FERRITIN 43 10/29/2019   Attestation Statements:   Reviewed by clinician on day of visit: allergies, medications, problem list, medical history, surgical history, family history, social history, and previous encounter notes.  Time spent on visit including pre-visit chart review and post-visit care and charting was 30 minutes.   I, Water quality scientist, CMA, am acting as transcriptionist for Briscoe Deutscher, DO  I have reviewed the above documentation for accuracy and completeness, and I agree with the above. Briscoe Deutscher, DO

## 2020-05-18 ENCOUNTER — Ambulatory Visit (INDEPENDENT_AMBULATORY_CARE_PROVIDER_SITE_OTHER): Payer: 59 | Admitting: Family Medicine

## 2020-05-18 ENCOUNTER — Encounter (INDEPENDENT_AMBULATORY_CARE_PROVIDER_SITE_OTHER): Payer: Self-pay | Admitting: Family Medicine

## 2020-05-18 ENCOUNTER — Other Ambulatory Visit: Payer: Self-pay

## 2020-05-18 VITALS — BP 122/84 | HR 61 | Temp 97.5°F | Ht 62.0 in | Wt 180.0 lb

## 2020-05-18 DIAGNOSIS — R198 Other specified symptoms and signs involving the digestive system and abdomen: Secondary | ICD-10-CM | POA: Diagnosis not present

## 2020-05-18 DIAGNOSIS — R600 Localized edema: Secondary | ICD-10-CM

## 2020-05-18 DIAGNOSIS — Z6833 Body mass index (BMI) 33.0-33.9, adult: Secondary | ICD-10-CM

## 2020-05-18 DIAGNOSIS — E669 Obesity, unspecified: Secondary | ICD-10-CM

## 2020-05-18 DIAGNOSIS — E8881 Metabolic syndrome: Secondary | ICD-10-CM | POA: Diagnosis not present

## 2020-05-18 DIAGNOSIS — R5383 Other fatigue: Secondary | ICD-10-CM

## 2020-05-19 LAB — CBC WITH DIFFERENTIAL/PLATELET
Basophils Absolute: 0.1 10*3/uL (ref 0.0–0.2)
Basos: 1 %
EOS (ABSOLUTE): 0.1 10*3/uL (ref 0.0–0.4)
Eos: 3 %
Hemoglobin: 14.3 g/dL (ref 11.1–15.9)
Immature Grans (Abs): 0 10*3/uL (ref 0.0–0.1)
Immature Granulocytes: 0 %
Lymphocytes Absolute: 1.1 10*3/uL (ref 0.7–3.1)
Lymphs: 30 %
MCH: 30.2 pg (ref 26.6–33.0)
MCHC: 33.3 g/dL (ref 31.5–35.7)
MCV: 91 fL (ref 79–97)
Monocytes Absolute: 0.3 10*3/uL (ref 0.1–0.9)
Monocytes: 7 %
Neutrophils Absolute: 2.2 10*3/uL (ref 1.4–7.0)
Neutrophils: 59 %
Platelets: 263 10*3/uL (ref 150–450)
RBC: 4.74 x10E6/uL (ref 3.77–5.28)
RDW: 14.1 % (ref 11.7–15.4)
WBC: 3.7 10*3/uL (ref 3.4–10.8)

## 2020-05-19 LAB — COMPREHENSIVE METABOLIC PANEL
ALT: 12 IU/L (ref 0–32)
AST: 18 IU/L (ref 0–40)
Albumin/Globulin Ratio: 2.1 (ref 1.2–2.2)
Albumin: 5 g/dL — ABNORMAL HIGH (ref 3.8–4.9)
Alkaline Phosphatase: 88 IU/L (ref 44–121)
BUN/Creatinine Ratio: 21 (ref 9–23)
BUN: 17 mg/dL (ref 6–24)
Bilirubin Total: 0.8 mg/dL (ref 0.0–1.2)
CO2: 25 mmol/L (ref 20–29)
Calcium: 9.7 mg/dL (ref 8.7–10.2)
Chloride: 99 mmol/L (ref 96–106)
Creatinine, Ser: 0.81 mg/dL (ref 0.57–1.00)
GFR calc Af Amer: 97 mL/min/{1.73_m2} (ref >59)
GFR calc non Af Amer: 84 mL/min/{1.73_m2} (ref >59)
Globulin, Total: 2.4 g/dL (ref 1.5–4.5)
Glucose: 79 mg/dL (ref 65–99)
Potassium: 3.9 mmol/L (ref 3.5–5.2)
Sodium: 141 mmol/L (ref 134–144)
Total Protein: 7.4 g/dL (ref 6.0–8.5)

## 2020-05-19 LAB — ANEMIA PANEL
Ferritin: 70 ng/mL (ref 15–150)
Folate, Hemolysate: 481 ng/mL
Folate, RBC: 1121 ng/mL (ref >498)
Hematocrit: 42.9 % (ref 34.0–46.6)
Iron Saturation: 16 % (ref 15–55)
Iron: 59 ug/dL (ref 27–159)
Retic Ct Pct: 1.7 % (ref 0.6–2.6)
Total Iron Binding Capacity: 362 ug/dL (ref 250–450)
UIBC: 303 ug/dL (ref 131–425)
Vitamin B-12: >2000 pg/mL — ABNORMAL HIGH (ref 232–1245)

## 2020-05-19 LAB — HEMOGLOBIN A1C
Est. average glucose Bld gHb Est-mCnc: 94 mg/dL
Hgb A1c MFr Bld: 4.9 % (ref 4.8–5.6)

## 2020-05-19 LAB — LIPID PANEL WITH LDL/HDL RATIO
Cholesterol, Total: 187 mg/dL (ref 100–199)
HDL: 59 mg/dL (ref >39)
LDL Chol Calc (NIH): 117 mg/dL — ABNORMAL HIGH (ref 0–99)
LDL/HDL Ratio: 2 ratio (ref 0.0–3.2)
Triglycerides: 60 mg/dL (ref 0–149)
VLDL Cholesterol Cal: 11 mg/dL (ref 5–40)

## 2020-05-19 LAB — INSULIN, RANDOM: INSULIN: 7.5 u[IU]/mL (ref 2.6–24.9)

## 2020-05-19 NOTE — Progress Notes (Signed)
Chief Complaint:   OBESITY Kaitlyn Morgan is here to discuss her progress with her obesity treatment plan along with follow-up of her obesity related diagnoses. [name] is on the Category 1 Plan and states she is following her eating plan approximately 0% of the time. [name] states she is doing PT (home) 30 minutes 1 times per week.  Today's visit was #: 8 Starting weight: 210 lbs Starting date: 10/29/2019 Today's weight: 180 lbs Today's date: 05/18/2020 Total lbs lost to date: 30 lbs Total lbs lost since last in-office visit: 3 lbs Total weight loss percentage to date: -14.29%  Interim History: [name] developed sinusitis after her last visit and was on antibiotics.  She says she needs to take her HCTZ daily now or she develops lower extremity edema.  GI is recommending that she see a surgeon to address her umbilical discharge.  Assessment/Plan:   1. Umbilical discharge [name] has a history of rectal cancer.  She has had no improvement with treatment, diflucan.  Referral placed to General Surgery today.  - Comprehensive metabolic panel - Lipid Panel With LDL/HDL Ratio - Ambulatory referral to General Surgery  2. Lower extremity edema [name] is taking HCTZ 25 mg daily for her edema.  Continue.  Will check labs today.  - Comprehensive metabolic panel - Lipid Panel With LDL/HDL Ratio  3. Insulin resistance [name] has a diagnosis of insulin resistance based on her elevated fasting insulin level >5. She will continue to focus on protein-rich, low simple carbohydrate foods. We reviewed the importance of hydration, regular exercise for stress reduction, and restorative sleep.   Lab Results  Component Value Date   INSULIN 7.5 05/18/2020   INSULIN 14.7 10/29/2019   Lab Results  Component Value Date   HGBA1C 4.9 05/18/2020   - Hemoglobin A1c - Insulin, random  4. Other fatigue [name] complains of more fatigue than usual.  Will check labs today.  - CBC with  Differential/Platelet - Comprehensive metabolic panel - Vitamin I14 - Anemia panel  5. Class 1 obesity with serious comorbidity and body mass index (BMI) of 33.0 to 33.9 in adult, unspecified obesity type  [name] is currently in the action stage of change. As such, her goal is to continue with weight loss efforts. She has agreed to the Category 1 Plan.   Exercise goals: As tolerated.  Behavioral modification strategies: increasing lean protein intake, decreasing simple carbohydrates and increasing vegetables.  [name] has agreed to follow-up with our clinic in 3 weeks. She was informed of the importance of frequent follow-up visits to maximize her success with intensive lifestyle modifications for her multiple health conditions.   [name] was informed we would discuss her lab results at her next visit unless there is a critical issue that needs to be addressed sooner. [place] agreed to keep her next visit at the agreed upon time to discuss these results.  Objective:   Blood pressure 122/84, pulse 61, temperature (!) 97.5 F (36.4 C), height 5\' 2"  (1.575 m), weight 180 lb (81.6 kg), SpO2 100 %. Body mass index is 32.92 kg/m.  General: Cooperative, alert, well developed, in no acute distress. HEENT: Conjunctivae and lids unremarkable. Cardiovascular: Regular rhythm.  Lungs: Normal work of breathing. Neurologic: No focal deficits.   Lab Results  Component Value Date   CREATININE 0.81 05/18/2020   BUN 17 05/18/2020   NA 141 05/18/2020   K 3.9 05/18/2020   CL 99 05/18/2020   CO2 25 05/18/2020   Lab Results  Component Value  Date   ALT 12 05/18/2020   AST 18 05/18/2020   ALKPHOS 88 05/18/2020   BILITOT 0.8 05/18/2020   Lab Results  Component Value Date   HGBA1C 4.9 05/18/2020   HGBA1C 5.1 10/29/2019   HGBA1C 5.0 03/14/2016   Lab Results  Component Value Date   INSULIN 7.5 05/18/2020   INSULIN 14.7 10/29/2019   Lab Results  Component Value Date   TSH 3.860  10/29/2019   Lab Results  Component Value Date   CHOL 187 05/18/2020   HDL 59 05/18/2020   LDLCALC 117 (H) 05/18/2020   TRIG 60 05/18/2020   CHOLHDL 3.6 10/29/2019   Lab Results  Component Value Date   WBC 3.7 05/18/2020   HGB 14.3 05/18/2020   HCT 42.9 05/18/2020   MCV 91 05/18/2020   PLT 263 05/18/2020   Lab Results  Component Value Date   IRON 59 05/18/2020   TIBC 362 05/18/2020   FERRITIN 70 05/18/2020   Attestation Statements:   Reviewed by clinician on day of visit: allergies, medications, problem list, medical history, surgical history, family history, social history, and previous encounter notes.  Time spent on visit including pre-visit chart review and post-visit care and charting was 30 minutes.   I, Water quality scientist, CMA, am acting as transcriptionist for Briscoe Deutscher, DO  I have reviewed the above documentation for accuracy and completeness, and I agree with the above. Briscoe Deutscher, DO

## 2020-06-17 ENCOUNTER — Encounter (INDEPENDENT_AMBULATORY_CARE_PROVIDER_SITE_OTHER): Payer: Self-pay | Admitting: Family Medicine

## 2020-06-17 ENCOUNTER — Ambulatory Visit (INDEPENDENT_AMBULATORY_CARE_PROVIDER_SITE_OTHER): Payer: 59 | Admitting: Family Medicine

## 2020-06-17 ENCOUNTER — Other Ambulatory Visit: Payer: Self-pay

## 2020-06-17 VITALS — BP 112/73 | HR 61 | Temp 98.4°F | Ht 62.0 in | Wt 185.0 lb

## 2020-06-17 DIAGNOSIS — Z9189 Other specified personal risk factors, not elsewhere classified: Secondary | ICD-10-CM | POA: Diagnosis not present

## 2020-06-17 DIAGNOSIS — E66811 Obesity, class 1: Secondary | ICD-10-CM

## 2020-06-17 DIAGNOSIS — R600 Localized edema: Secondary | ICD-10-CM

## 2020-06-17 DIAGNOSIS — F418 Other specified anxiety disorders: Secondary | ICD-10-CM

## 2020-06-17 DIAGNOSIS — R1033 Periumbilical pain: Secondary | ICD-10-CM | POA: Diagnosis not present

## 2020-06-17 DIAGNOSIS — E669 Obesity, unspecified: Secondary | ICD-10-CM

## 2020-06-17 DIAGNOSIS — Z6834 Body mass index (BMI) 34.0-34.9, adult: Secondary | ICD-10-CM

## 2020-06-18 NOTE — Progress Notes (Signed)
Chief Complaint:   OBESITY Kaitlyn Morgan is here to discuss her progress with her obesity treatment plan along with follow-up of her obesity related diagnoses.   Today's visit was #: 9 Starting weight: 210 lbs Starting date: 10/29/2019 Today's weight: 185 lbs Today's date: 06/17/2020 Total lbs lost to date: 25 lbs Body mass index is 33.84 kg/m.  Total weight loss percentage to date: -11.90%  Interim History: Kaitlyn Morgan saw her GI provider and was told that she needed to see General Surgery to evaluate the umbilical discharge. See previous notes for more details. New symptom of acute periumbilical burning pain, intermittent, with lifting or intraabdominal pressure.  Nutrition Plan: the Category 1 Plan for 50% of the time.  Hunger is well controlled controlled. Cravings are moderately controlled controlled.  Activity: None. Sleep: Sleep is restful.   Assessment/Plan:   1. Umbilical pain With discharge. Chronic, worsening. Hx of GI cancer. No bowel changes.   Plan:  Referral to General Surgery placed today.  Abdomal CT ordered today as well to move along work-up.   - Ambulatory referral to General Surgery - CT Abdomen Pelvis W Contrast; Future  2. Bilateral lower extremity edema Worsened. She is on HCTZ, but says she skipped a few doses.    BP Readings from Last 3 Encounters:  06/17/20 112/73  05/18/20 122/84  04/27/20 108/73   Lab Results  Component Value Date   CREATININE 0.81 05/18/2020   Plan:  Decrease salt intake.  Increase water intake.  Consider starting triamterene-HCTZ.  3. Situational anxiety She is living with her boyfriend and his son. 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 She has considered moving out. She denies physical abuse. Plan: Discussed therapy. Offered resources if she needs to leave the home quickly. I will continue to monitor closely.   4. At risk for malnutrition Kaitlyn Morgan was given approximately 15 minutes of counseling today regarding  prevention of malnutrition and ways to meet macronutrient goals. She is at increased risk now due to abdominal pain.  5. Class 1 obesity with serious comorbidity and body mass index (BMI) of 34.0 to 34.9 in adult, unspecified obesity type  Course: Kaitlyn Morgan is currently in the action stage of change. As such, her goal is to continue with weight loss efforts.   Nutrition goals: She has agreed to the Category 1 Plan.   Exercise goals: For substantial health benefits, adults should do at least 150 minutes (2 hours and 30 minutes) a week of moderate-intensity, or 75 minutes (1 hour and 15 minutes) a week of vigorous-intensity aerobic physical activity, or an equivalent combination of moderate- and vigorous-intensity aerobic activity. Aerobic activity should be performed in episodes of at least 10 minutes, and preferably, it should be spread throughout the week.  Behavioral modification strategies: increasing lean protein intake, decreasing simple carbohydrates, increasing vegetables and increasing water intake.  Kaitlyn Morgan has agreed to follow-up with our clinic in 3 weeks. She was informed of the importance of frequent follow-up visits to maximize her success with intensive lifestyle modifications for her multiple health conditions.   Objective:   Blood pressure 112/73, pulse 61, temperature 98.4 F (36.9 C), temperature source Oral, height 5\' 2"  (1.575 m), weight 185 lb (83.9 kg), SpO2 99 %. Body mass index is 33.84 kg/m.  General: Cooperative, alert, well developed, in no acute distress. HEENT: Conjunctivae and lids unremarkable. Cardiovascular: Regular rhythm.  Lungs: Normal work of breathing. Neurologic: No focal deficits.   Lab Results  Component Value Date   CREATININE  0.81 05/18/2020   BUN 17 05/18/2020   NA 141 05/18/2020   K 3.9 05/18/2020   CL 99 05/18/2020   CO2 25 05/18/2020   Lab Results  Component Value Date   ALT 12 05/18/2020   AST 18 05/18/2020   ALKPHOS 88 05/18/2020    BILITOT 0.8 05/18/2020   Lab Results  Component Value Date   HGBA1C 4.9 05/18/2020   HGBA1C 5.1 10/29/2019   HGBA1C 5.0 03/14/2016   Lab Results  Component Value Date   INSULIN 7.5 05/18/2020   INSULIN 14.7 10/29/2019   Lab Results  Component Value Date   TSH 3.860 10/29/2019   Lab Results  Component Value Date   CHOL 187 05/18/2020   HDL 59 05/18/2020   LDLCALC 117 (H) 05/18/2020   TRIG 60 05/18/2020   CHOLHDL 3.6 10/29/2019   Lab Results  Component Value Date   WBC 3.7 05/18/2020   HGB 14.3 05/18/2020   HCT 42.9 05/18/2020   MCV 91 05/18/2020   PLT 263 05/18/2020   Lab Results  Component Value Date   IRON 59 05/18/2020   TIBC 362 05/18/2020   FERRITIN 70 05/18/2020   Attestation Statements:   Reviewed by clinician on day of visit: allergies, medications, problem list, medical history, surgical history, family history, social history, and previous encounter notes.  I, Water quality scientist, CMA, am acting as transcriptionist for Briscoe Deutscher, DO  I have reviewed the above documentation for accuracy and completeness, and I agree with the above. Briscoe Deutscher, DO

## 2020-06-30 ENCOUNTER — Encounter (INDEPENDENT_AMBULATORY_CARE_PROVIDER_SITE_OTHER): Payer: Self-pay | Admitting: Family Medicine

## 2020-06-30 NOTE — Telephone Encounter (Signed)
Pt. Wants to try a new medication.

## 2020-07-14 ENCOUNTER — Encounter (INDEPENDENT_AMBULATORY_CARE_PROVIDER_SITE_OTHER): Payer: Self-pay | Admitting: Family Medicine

## 2020-07-14 ENCOUNTER — Other Ambulatory Visit: Payer: Self-pay

## 2020-07-14 ENCOUNTER — Ambulatory Visit (INDEPENDENT_AMBULATORY_CARE_PROVIDER_SITE_OTHER): Payer: 59 | Admitting: Family Medicine

## 2020-07-14 VITALS — BP 107/69 | HR 73 | Temp 98.1°F | Ht 62.0 in | Wt 185.0 lb

## 2020-07-14 DIAGNOSIS — R1033 Periumbilical pain: Secondary | ICD-10-CM

## 2020-07-14 DIAGNOSIS — E669 Obesity, unspecified: Secondary | ICD-10-CM

## 2020-07-14 DIAGNOSIS — Z9189 Other specified personal risk factors, not elsewhere classified: Secondary | ICD-10-CM | POA: Diagnosis not present

## 2020-07-14 DIAGNOSIS — Z6833 Body mass index (BMI) 33.0-33.9, adult: Secondary | ICD-10-CM

## 2020-07-14 DIAGNOSIS — R600 Localized edema: Secondary | ICD-10-CM | POA: Diagnosis not present

## 2020-07-14 DIAGNOSIS — R632 Polyphagia: Secondary | ICD-10-CM | POA: Diagnosis not present

## 2020-07-14 MED ORDER — PHENTERMINE HCL 37.5 MG PO TABS: 37.5000 mg | ORAL_TABLET | Freq: Every day | ORAL | 0 refills | Status: DC

## 2020-07-15 ENCOUNTER — Ambulatory Visit
Admission: RE | Admit: 2020-07-15 | Discharge: 2020-07-15 | Disposition: A | Discharge status: Home / Self Care | Payer: 59 | Source: Ambulatory Visit | Attending: Family Medicine | Admitting: Family Medicine

## 2020-07-15 DIAGNOSIS — R1033 Periumbilical pain: Secondary | ICD-10-CM

## 2020-07-15 MED ORDER — IOPAMIDOL (ISOVUE-300) INJECTION 61%
100.0000 mL | Freq: Once | INTRAVENOUS | Status: AC | PRN
  Administered 2020-07-15: 100 mL via INTRAVENOUS

## 2020-07-15 NOTE — Progress Notes (Signed)
Chief Complaint:   OBESITY Kaitlyn Morgan is here to discuss her progress with her obesity treatment plan along with follow-up of her obesity related diagnoses.   Today's visit was #: 10 Starting weight: 210 lbs Starting date: 10/29/2019 Today's weight: 185 lbs Today's date: 07/14/2020 Total lbs lost to date: 25 lbs Body mass index is 33.84 kg/m.  Total weight loss percentage to date: -11.90%  Interim History: Kaitlyn Morgan continues to have chronic umbilical pain and discharge.  She is having a CT scan performed tomorrow and will be seeing the surgeon next week.    She says she struggles with hunger and cravings.  Her metabolism is lower than expected.  She is interested in starting phentermine.  Nutrition Plan: the Category 1 Plan for 91% of the time.  Hunger is poorly controlled.  Cravings are poorly controlled. Activity: None at this time.  Assessment/Plan:   1. Polyphagia Hyperphagia, also called polyphagia, refers to excessive feelings of hunger. This is more likely to be an issues for people that have diabetes, prediabetes, or insulin resistance. She will continue to focus on protein-rich, low simple carbohydrate foods. We reviewed the importance of hydration, regular exercise for stress reduction, and restorative sleep.  - Start phentermine (ADIPEX-P) 37.5 MG tablet; Take 1 tablet (37.5 mg total) by mouth daily before breakfast.  Dispense: 30 tablet; Refill: 0  2. Umbilical pain and discharge Chronic, worsening. Images tomorrow.  She will visit with the surgeon next week.  3. Lower extremity edema Continue HCTZ 25 mg daily. We will continue to monitor symptoms as they relate to her weight loss journey.  4. At risk for constipation Kaitlyn Morgan was informed that a decrease in bowel movement frequency is normal while losing weight, but stools should not be hard or painful. Orders and follow up as documented in patient record.   Counseling Getting to Good Bowel Health: Your goal is  to have one soft bowel movement each day. Drink at least 8 glasses of water each day. Eat plenty of fiber (goal is over 25 grams each day). It is best to get most of your fiber from dietary sources which includes leafy green vegetables, fresh fruit, and whole grains. You may need to add fiber with the help of OTC fiber supplements. These include Metamucil, Citrucel, and Flaxseed. If you are still having trouble, try adding Miralax or Magnesium Citrate. If all of these changes do not work, Cabin crew.  5. Class 1 obesity with serious comorbidity and body mass index (BMI) of 33.0 to 33.9 in adult, unspecified obesity type  Course: Kaitlyn Morgan is currently in the action stage of change. As such, her goal is to continue with weight loss efforts.   Nutrition goals: She has agreed to the Category 1 Plan.   Exercise goals: All adults should avoid inactivity. Some physical activity is better than none, and adults who participate in any amount of physical activity gain some health benefits.  Behavioral modification strategies: increasing lean protein intake, decreasing simple carbohydrates, increasing vegetables and increasing water intake.  Kaitlyn Morgan has agreed to follow-up with our clinic in 2 weeks. She was informed of the importance of frequent follow-up visits to maximize her success with intensive lifestyle modifications for her multiple health conditions.   Objective:   Blood pressure 107/69, pulse 73, temperature 98.1 F (36.7 C), temperature source Oral, height 5\' 2"  (1.575 m), weight 185 lb (83.9 kg), SpO2 100 %. Body mass index is 33.84 kg/m.  General: Cooperative, alert, well developed,  in no acute distress. HEENT: Conjunctivae and lids unremarkable. Cardiovascular: Regular rhythm.  Lungs: Normal work of breathing. Neurologic: No focal deficits.   Lab Results  Component Value Date   CREATININE 0.81 05/18/2020   BUN 17 05/18/2020   NA 141 05/18/2020   K 3.9 05/18/2020   CL 99  05/18/2020   CO2 25 05/18/2020   Lab Results  Component Value Date   ALT 12 05/18/2020   AST 18 05/18/2020   ALKPHOS 88 05/18/2020   BILITOT 0.8 05/18/2020   Lab Results  Component Value Date   HGBA1C 4.9 05/18/2020   HGBA1C 5.1 10/29/2019   HGBA1C 5.0 03/14/2016   Lab Results  Component Value Date   INSULIN 7.5 05/18/2020   INSULIN 14.7 10/29/2019   Lab Results  Component Value Date   TSH 3.860 10/29/2019   Lab Results  Component Value Date   CHOL 187 05/18/2020   HDL 59 05/18/2020   LDLCALC 117 (H) 05/18/2020   TRIG 60 05/18/2020   CHOLHDL 3.6 10/29/2019   Lab Results  Component Value Date   WBC 3.7 05/18/2020   HGB 14.3 05/18/2020   HCT 42.9 05/18/2020   MCV 91 05/18/2020   PLT 263 05/18/2020   Lab Results  Component Value Date   IRON 59 05/18/2020   TIBC 362 05/18/2020   FERRITIN 70 05/18/2020   Attestation Statements:   Reviewed by clinician on day of visit: allergies, medications, problem list, medical history, surgical history, family history, social history, and previous encounter notes.  I, Water quality scientist, CMA, am acting as transcriptionist for Briscoe Deutscher, DO  I have reviewed the above documentation for accuracy and completeness, and I agree with the above. Briscoe Deutscher, DO

## 2020-07-23 NOTE — Telephone Encounter (Signed)
FYI

## 2020-07-28 ENCOUNTER — Ambulatory Visit (INDEPENDENT_AMBULATORY_CARE_PROVIDER_SITE_OTHER): Payer: 59 | Admitting: Family Medicine

## 2020-07-28 ENCOUNTER — Encounter (INDEPENDENT_AMBULATORY_CARE_PROVIDER_SITE_OTHER): Payer: Self-pay | Admitting: Family Medicine

## 2020-07-28 ENCOUNTER — Other Ambulatory Visit: Payer: Self-pay

## 2020-07-28 VITALS — BP 113/74 | HR 71 | Temp 97.9°F | Ht 62.0 in | Wt 181.0 lb

## 2020-07-28 DIAGNOSIS — E669 Obesity, unspecified: Secondary | ICD-10-CM | POA: Diagnosis not present

## 2020-07-28 DIAGNOSIS — R632 Polyphagia: Secondary | ICD-10-CM

## 2020-07-28 DIAGNOSIS — Z9189 Other specified personal risk factors, not elsewhere classified: Secondary | ICD-10-CM

## 2020-07-28 DIAGNOSIS — R198 Other specified symptoms and signs involving the digestive system and abdomen: Secondary | ICD-10-CM | POA: Diagnosis not present

## 2020-07-28 DIAGNOSIS — Z6833 Body mass index (BMI) 33.0-33.9, adult: Secondary | ICD-10-CM | POA: Diagnosis not present

## 2020-07-28 MED ORDER — PHENTERMINE HCL 37.5 MG PO TABS: ORAL_TABLET | ORAL | 0 refills | Status: DC

## 2020-07-28 MED ORDER — NYSTATIN 100000 UNIT/GM EX OINT: 1.0000 "application " | TOPICAL_OINTMENT | Freq: Two times a day (BID) | CUTANEOUS | 0 refills | Status: DC

## 2020-07-28 NOTE — Progress Notes (Signed)
Chief Complaint:   OBESITY Kaitlyn Morgan is here to discuss her progress with her obesity treatment plan along with follow-up of her obesity related diagnoses.   Today's visit was #: 11 Starting weight: 210 lbs Starting date: 10/29/2019 Today's weight: 181 lbs Today's date: 07/28/2020 Total lbs lost to date: 29 lbs Body mass index is 33.11 kg/m.  Total weight loss percentage to date: -13.81%  Interim History: Kaitlyn Morgan and I reviewed her CT scan together.  She says she is tolerating phentermine with no side effects. Nutrition Plan: the Category 1 Plan.  Anti-obesity medications: phentermine 18.75 mg twice daily. Reported side effects: None. Activity: Increased activity.  Assessment/Plan:   1. Umbilical discharge CT was reviewed. No fistula seen. After discussion, patient would like to start below medication. Expectations, risks, and potential side effects reviewed.   Plan:  Will start nystatin ointment twice daily.  2. Polyphagia Improvement.  Hyperphagia, also called polyphagia, refers to excessive feelings of hunger. This is more likely to be an issues for people that have diabetes, prediabetes, or insulin resistance. She will continue to focus on protein-rich, low simple carbohydrate foods. We reviewed the importance of hydration, regular exercise for stress reduction, and restorative sleep.  - Refill phentermine (ADIPEX-P) 37.5 MG tablet; 1/2 tablet before breakfast, 1/2 tablet at noon  Dispense: 30 tablet; Refill: 0  3. At risk for constipation Kaitlyn Morgan was given approximately 9 minutes of counseling today regarding prevention of constipation and the importance of avoiding this in the setting of abdominal scar tissue. She was encouraged to increase water and fiber intake.   4. Class 1 obesity with serious comorbidity and body mass index (BMI) of 33.0 to 33.9 in adult, unspecified obesity type  Course: Kaitlyn Morgan is currently in the action stage of change. As such, her goal is to  continue with weight loss efforts.   Nutrition goals: She has agreed to the Category 1 Plan.   Exercise goals: For substantial health benefits, adults should do at least 150 minutes (2 hours and 30 minutes) a week of moderate-intensity, or 75 minutes (1 hour and 15 minutes) a week of vigorous-intensity aerobic physical activity, or an equivalent combination of moderate- and vigorous-intensity aerobic activity. Aerobic activity should be performed in episodes of at least 10 minutes, and preferably, it should be spread throughout the week.  Behavioral modification strategies: increasing lean protein intake, decreasing simple carbohydrates, increasing vegetables and increasing water intake.  Kaitlyn Morgan has agreed to follow-up with our clinic in 4 weeks. She was informed of the importance of frequent follow-up visits to maximize her success with intensive lifestyle modifications for her multiple health conditions.   Objective:   Blood pressure 113/74, pulse 71, temperature 97.9 F (36.6 C), temperature source Oral, height 5\' 2"  (1.575 m), weight 181 lb (82.1 kg), SpO2 98 %. Body mass index is 33.11 kg/m.  General: Cooperative, alert, well developed, in no acute distress. HEENT: Conjunctivae and lids unremarkable. Cardiovascular: Regular rhythm.  Lungs: Normal work of breathing. Neurologic: No focal deficits.   Lab Results  Component Value Date   CREATININE 0.81 05/18/2020   BUN 17 05/18/2020   NA 141 05/18/2020   K 3.9 05/18/2020   CL 99 05/18/2020   CO2 25 05/18/2020   Lab Results  Component Value Date   ALT 12 05/18/2020   AST 18 05/18/2020   ALKPHOS 88 05/18/2020   BILITOT 0.8 05/18/2020   Lab Results  Component Value Date   HGBA1C 4.9 05/18/2020   HGBA1C 5.1  10/29/2019   HGBA1C 5.0 03/14/2016   Lab Results  Component Value Date   INSULIN 7.5 05/18/2020   INSULIN 14.7 10/29/2019   Lab Results  Component Value Date   TSH 3.860 10/29/2019   Lab Results  Component  Value Date   CHOL 187 05/18/2020   HDL 59 05/18/2020   LDLCALC 117 (H) 05/18/2020   TRIG 60 05/18/2020   CHOLHDL 3.6 10/29/2019   Lab Results  Component Value Date   WBC 3.7 05/18/2020   HGB 14.3 05/18/2020   HCT 42.9 05/18/2020   MCV 91 05/18/2020   PLT 263 05/18/2020   Lab Results  Component Value Date   IRON 59 05/18/2020   TIBC 362 05/18/2020   FERRITIN 70 05/18/2020   Attestation Statements:   Reviewed by clinician on day of visit: allergies, medications, problem list, medical history, surgical history, family history, social history, and previous encounter notes.  I, Water quality scientist, CMA, am acting as transcriptionist for Briscoe Deutscher, DO  I have reviewed the above documentation for accuracy and completeness, and I agree with the above. Briscoe Deutscher, DO

## 2020-08-27 ENCOUNTER — Other Ambulatory Visit: Payer: Self-pay

## 2020-08-27 ENCOUNTER — Telehealth (INDEPENDENT_AMBULATORY_CARE_PROVIDER_SITE_OTHER): Payer: 59 | Admitting: Family Medicine

## 2020-08-27 ENCOUNTER — Encounter (INDEPENDENT_AMBULATORY_CARE_PROVIDER_SITE_OTHER): Payer: Self-pay | Admitting: Family Medicine

## 2020-08-27 DIAGNOSIS — R059 Cough, unspecified: Secondary | ICD-10-CM | POA: Diagnosis not present

## 2020-08-27 DIAGNOSIS — R632 Polyphagia: Secondary | ICD-10-CM

## 2020-08-27 DIAGNOSIS — U071 COVID-19: Secondary | ICD-10-CM

## 2020-08-27 DIAGNOSIS — E66811 Obesity, class 1: Secondary | ICD-10-CM

## 2020-08-27 DIAGNOSIS — E669 Obesity, unspecified: Secondary | ICD-10-CM

## 2020-08-27 DIAGNOSIS — Z6833 Body mass index (BMI) 33.0-33.9, adult: Secondary | ICD-10-CM

## 2020-08-27 NOTE — Progress Notes (Signed)
TeleHealth Visit:  Due to the COVID-19 pandemic, this visit was completed with telemedicine (audio/video) technology to reduce patient and provider exposure as well as to preserve personal protective equipment.   Kaitlyn Morgan has verbally consented to this TeleHealth visit. The patient is located at home, the provider is located at the [facility] office. The participants in this visit include the listed provider and patient. The visit was conducted today via MyChart video.  Chief Complaint: OBESITY Kaitlyn Morgan is here to discuss her progress with her obesity treatment plan along with follow-up of her obesity related diagnoses. Kaitlyn Morgan is on the Category 1 Plan and states she is following her eating plan approximately 50% of the time. Kaitlyn Morgan states she is not exercising regularly.  Today's visit was #: 12 Starting weight: 210 lbs Starting date: 10/29/2019  Interim History: Kaitlyn Morgan says that she has COVID.  Endorses loss of taste and smell and a cough that is worse at night.  Assessment/Plan:   1. Cough Kaitlyn Morgan has a cough due to COVID.  She says it is worse at night.  Will refill promethazine DM for her today.  We will continue to monitor symptoms as they relate to her weight loss journey.  2. Polyphagia Kaitlyn Morgan is taking phentermine 18.75 mg twice daily.  Will refill today but she will hold this medication until back to normal.  She will continue to focus on protein-rich, low simple carbohydrate foods. We reviewed the importance of hydration, regular exercise for stress reduction, and restorative sleep.  3. COVID We will continue to monitor. We will continue to monitor symptoms as they relate to her weight loss journey.  4. Class 1 obesity with serious comorbidity and body mass index (BMI) of 33.0 to 33.9 in adult, unspecified obesity type  Kaitlyn Morgan is currently in the action stage of change. As such, her goal is to continue with weight loss efforts. She has agreed to the  Category 1 Plan.   Exercise goals: No exercise has been prescribed at this time.  Behavioral modification strategies: increasing lean protein intake, decreasing simple carbohydrates, increasing vegetables, increasing water intake and decreasing liquid calories.  Kaitlyn Morgan has agreed to follow-up with our clinic in 3 weeks. She was informed of the importance of frequent follow-up visits to maximize her success with intensive lifestyle modifications for her multiple health conditions.  Objective:   VITALS: Per patient if applicable, see vitals. GENERAL: Alert and in no acute distress. CARDIOPULMONARY: No increased WOB. Speaking in clear sentences.  PSYCH: Pleasant and cooperative. Speech normal rate and rhythm. Affect is appropriate. Insight and judgement are appropriate. Attention is focused, linear, and appropriate.  NEURO: Oriented as arrived to appointment on time with no prompting.   Lab Results  Component Value Date   CREATININE 0.81 05/18/2020   BUN 17 05/18/2020   NA 141 05/18/2020   K 3.9 05/18/2020   CL 99 05/18/2020   CO2 25 05/18/2020   Lab Results  Component Value Date   ALT 12 05/18/2020   AST 18 05/18/2020   ALKPHOS 88 05/18/2020   BILITOT 0.8 05/18/2020   Lab Results  Component Value Date   HGBA1C 4.9 05/18/2020   HGBA1C 5.1 10/29/2019   HGBA1C 5.0 03/14/2016   Lab Results  Component Value Date   INSULIN 7.5 05/18/2020   INSULIN 14.7 10/29/2019   Lab Results  Component Value Date   TSH 3.860 10/29/2019   Lab Results  Component Value Date   CHOL 187 05/18/2020   HDL  59 05/18/2020   LDLCALC 117 (H) 05/18/2020   TRIG 60 05/18/2020   CHOLHDL 3.6 10/29/2019   Lab Results  Component Value Date   WBC 3.7 05/18/2020   HGB 14.3 05/18/2020   HCT 42.9 05/18/2020   MCV 91 05/18/2020   PLT 263 05/18/2020   Lab Results  Component Value Date   IRON 59 05/18/2020   TIBC 362 05/18/2020   FERRITIN 70 05/18/2020   Attestation Statements:   Reviewed by  clinician on day of visit: allergies, medications, problem list, medical history, surgical history, family history, social history, and previous encounter notes.  I, Water quality scientist, CMA, am acting as transcriptionist for Briscoe Deutscher, DO  I have reviewed the above documentation for accuracy and completeness, and I agree with the above. Briscoe Deutscher, DO

## 2020-09-08 MED ORDER — PHENTERMINE HCL 37.5 MG PO TABS: ORAL_TABLET | ORAL | 0 refills | Status: DC

## 2020-09-08 MED ORDER — PROMETHAZINE-DM 6.25-15 MG/5ML PO SYRP: 5.0000 mL | ORAL_SOLUTION | Freq: Four times a day (QID) | ORAL | 0 refills | Status: DC | PRN

## 2020-09-14 ENCOUNTER — Telehealth (INDEPENDENT_AMBULATORY_CARE_PROVIDER_SITE_OTHER): Payer: Self-pay

## 2020-09-14 NOTE — Telephone Encounter (Signed)
PA has been initiated via [email or web address] for Phentermine.  [name] Key: [id number] - PA Case ID: [id number] - Rx #: [id number] Need help? Call us at [phone] Status Sent to [facility] Phentermine HCl 37.5MG  tablets Form [facility] Electronic Prior Authorization Form (2017 NCPDP) Original Claim Info 75 PA Required call [phone] Requires Prior Authorization

## 2020-09-15 NOTE — Telephone Encounter (Signed)
Appro[place] January 31 Request Reference Number: [id number]. PHENTERMINE TAB 37.5MG  is approved through 03/14/2021. Your patient may now fill this prescription and it will be covered

## 2020-09-21 ENCOUNTER — Telehealth (INDEPENDENT_AMBULATORY_CARE_PROVIDER_SITE_OTHER): Payer: 59 | Admitting: Family Medicine

## 2020-09-21 ENCOUNTER — Other Ambulatory Visit: Payer: Self-pay

## 2020-09-21 ENCOUNTER — Encounter (INDEPENDENT_AMBULATORY_CARE_PROVIDER_SITE_OTHER): Payer: Self-pay | Admitting: Family Medicine

## 2020-09-21 DIAGNOSIS — Z6833 Body mass index (BMI) 33.0-33.9, adult: Secondary | ICD-10-CM

## 2020-09-21 DIAGNOSIS — E559 Vitamin D deficiency, unspecified: Secondary | ICD-10-CM | POA: Diagnosis not present

## 2020-09-21 DIAGNOSIS — E8881 Metabolic syndrome: Secondary | ICD-10-CM | POA: Diagnosis not present

## 2020-09-21 DIAGNOSIS — E669 Obesity, unspecified: Secondary | ICD-10-CM

## 2020-09-21 DIAGNOSIS — R519 Headache, unspecified: Secondary | ICD-10-CM

## 2020-09-23 NOTE — Progress Notes (Signed)
TeleHealth Visit:  Due to the COVID-19 pandemic, this visit was completed with telemedicine (audio/video) technology to reduce patient and provider exposure as well as to preserve personal protective equipment.   Kaitlyn Morgan has verbally consented to this TeleHealth visit. The patient is located at home, the provider is located at the [facility] office. The participants in this visit include the listed provider and patient and. The visit was conducted today via [place].  OBESITY Kaitlyn Morgan is here to discuss her progress with her obesity treatment plan along with follow-up of her obesity related diagnoses.   Today's visit was #: 13 Starting weight: 210 lbs Starting date: 10/29/2019 Today's weight: 184 lbs Today's date: 09/21/2020 Total lbs lost to date: 26 lbs Total weight loss percentage to date: -12.38%  Interim History: Kaitlyn Morgan says she is still feeling poorly.  She is having headaches, has no taste or smell, and endorses constipation.  She says she is not going to take her phentermine until she is better.  She has not been drinking water.  Plan:  Drink 4 (16 oz.) bottles of water per day.  Nutrition Plan: Category 1 Plan for 50% of the time. Anti-obesity medications: phentermine 18.75 mg daily (not taking at this time). Reported side effects: None. Activity: None at this time.  Assessment/Plan:   1. Nonintractable headache, unspecified chronicity pattern, unspecified headache type Likely due to dehydration.  Plan:  Increase fluids to at least 4 (16 oz.) bottles of water per day.  2. Vitamin D deficiency Improving, but not optimized. Current vitamin D is 35.9, tested on 10/29/2019. Optimal goal > 50 ng/dL. Plan:  Continue daily multivitamin.  Follow-up for routine testing of Vitamin D, at least 2-3 times per year to avoid over-replacement.  3. Insulin resistance Controlled. Goal is HgbA1c < 5.7, fasting insulin closer to 5.  Medication: None.   Plan:  She will  continue to focus on protein-rich, low simple carbohydrate foods. We reviewed the importance of hydration, regular exercise for stress reduction, and restorative sleep.   Lab Results  Component Value Date   HGBA1C 4.9 05/18/2020   Lab Results  Component Value Date   INSULIN 7.5 05/18/2020   INSULIN 14.7 10/29/2019   4. Class 1 obesity with serious comorbidity and body mass index (BMI) of 33.0 to 33.9 in adult, unspecified obesity type  Course: Kaitlyn Morgan is currently in the action stage of change. As such, her goal is to continue with weight loss efforts.   Nutrition goals: She has agreed to the Category 1 Plan.   Exercise goals: As tolerated.  Behavioral modification strategies: increasing lean protein intake and increasing water intake.  Kaitlyn Morgan has agreed to follow-up with our clinic in 3 weeks. She was informed of the importance of frequent follow-up visits to maximize her success with intensive lifestyle modifications for her multiple health conditions.   Objective:   There were no vitals taken for this visit. There is no height or weight on file to calculate BMI.  General: Cooperative, alert, well developed, in no acute distress. HEENT: Conjunctivae and lids unremarkable. Cardiovascular: Regular rhythm.  Lungs: Normal work of breathing. Neurologic: No focal deficits.   Lab Results  Component Value Date   CREATININE 0.81 05/18/2020   BUN 17 05/18/2020   NA 141 05/18/2020   K 3.9 05/18/2020   CL 99 05/18/2020   CO2 25 05/18/2020   Lab Results  Component Value Date   ALT 12 05/18/2020   AST 18 05/18/2020  ALKPHOS 88 05/18/2020   BILITOT 0.8 05/18/2020   Lab Results  Component Value Date   HGBA1C 4.9 05/18/2020   HGBA1C 5.1 10/29/2019   HGBA1C 5.0 03/14/2016   Lab Results  Component Value Date   INSULIN 7.5 05/18/2020   INSULIN 14.7 10/29/2019   Lab Results  Component Value Date   TSH 3.860 10/29/2019   Lab Results  Component Value Date   CHOL 187  05/18/2020   HDL 59 05/18/2020   LDLCALC 117 (H) 05/18/2020   TRIG 60 05/18/2020   CHOLHDL 3.6 10/29/2019   Lab Results  Component Value Date   WBC 3.7 05/18/2020   HGB 14.3 05/18/2020   HCT 42.9 05/18/2020   MCV 91 05/18/2020   PLT 263 05/18/2020   Lab Results  Component Value Date   IRON 59 05/18/2020   TIBC 362 05/18/2020   FERRITIN 70 05/18/2020   Attestation Statements:   Reviewed by clinician on day of visit: allergies, medications, problem list, medical history, surgical history, family history, social history, and previous encounter notes.  Time spent on visit including pre-visit chart review and post-visit care and charting was 22 minutes.   I, Water quality scientist, CMA, am acting as transcriptionist for Briscoe Deutscher, DO  I have reviewed the above documentation for accuracy and completeness, and I agree with the above. Briscoe Deutscher, DO

## 2020-10-15 ENCOUNTER — Encounter (INDEPENDENT_AMBULATORY_CARE_PROVIDER_SITE_OTHER): Payer: Self-pay | Admitting: Family Medicine

## 2020-10-22 ENCOUNTER — Other Ambulatory Visit: Payer: Self-pay

## 2020-10-22 ENCOUNTER — Encounter (INDEPENDENT_AMBULATORY_CARE_PROVIDER_SITE_OTHER): Payer: Self-pay | Admitting: Family Medicine

## 2020-10-22 ENCOUNTER — Ambulatory Visit (INDEPENDENT_AMBULATORY_CARE_PROVIDER_SITE_OTHER): Payer: 59 | Admitting: Family Medicine

## 2020-10-22 VITALS — BP 119/77 | HR 74 | Temp 97.6°F | Ht 62.0 in | Wt 187.0 lb

## 2020-10-22 DIAGNOSIS — Z9189 Other specified personal risk factors, not elsewhere classified: Secondary | ICD-10-CM

## 2020-10-22 DIAGNOSIS — E8881 Metabolic syndrome: Secondary | ICD-10-CM

## 2020-10-22 DIAGNOSIS — R600 Localized edema: Secondary | ICD-10-CM

## 2020-10-22 DIAGNOSIS — E88819 Insulin resistance, unspecified: Secondary | ICD-10-CM

## 2020-10-22 DIAGNOSIS — E669 Obesity, unspecified: Secondary | ICD-10-CM

## 2020-10-22 DIAGNOSIS — Z780 Asymptomatic menopausal state: Secondary | ICD-10-CM | POA: Diagnosis not present

## 2020-10-22 DIAGNOSIS — Z6834 Body mass index (BMI) 34.0-34.9, adult: Secondary | ICD-10-CM

## 2020-10-22 DIAGNOSIS — R1033 Periumbilical pain: Secondary | ICD-10-CM | POA: Diagnosis not present

## 2020-10-25 ENCOUNTER — Encounter (INDEPENDENT_AMBULATORY_CARE_PROVIDER_SITE_OTHER): Payer: Self-pay | Admitting: Family Medicine

## 2020-10-25 NOTE — Progress Notes (Signed)
Chief Complaint:   OBESITY Kaitlyn Morgan is here to discuss her progress with her obesity treatment plan along with follow-up of her obesity related diagnoses.   Today's visit was #: 14 Starting weight: 210 lbs Starting date: 10/29/2019 Today's weight: 187 lbs Today's date: 10/22/2020 Total lbs lost to date: 23 lbs Body mass index is 34.2 kg/m.  Total weight loss percentage to date: -10.95%  Interim History:  Kaitlyn Morgan complained of mid abdominal pain, constipation, and nausea last week (see MyChart message).  Concern for developing obstruction/pseudo obstruction.  Improving with bowel rest.  Appetite improving.  Current Meal Plan: the Category 1 Plan for 0% of the time.  Current Exercise Plan: None. Current Anti-Obesity Medications: phentermine 18.75 mg twice daily. Side effects: None.  Assessment/Plan:   1. Periumbilical abdominal pain Improving.  History of rectal cancer status post surgery, chemo (oral), radiation. CT abdomen/pelvis on 07/15/2020 reassuring. The patient understands monitoring parameters and red flags.   2. Insulin resistance with polyphagia Improving, but not optimized. Goal is HgbA1c < 5.7, fasting insulin closer to 5.  Medication: None.  Not taking phentermine regularly due to recent abdominal pain.  Plan:  She will continue to focus on protein-rich, low simple carbohydrate foods. We reviewed the importance of hydration, regular exercise for stress reduction, and restorative sleep.    Lab Results  Component Value Date   HGBA1C 4.9 05/18/2020   Lab Results  Component Value Date   INSULIN 7.5 05/18/2020   INSULIN 14.7 10/29/2019   3. Bilateral lower extremity edema Kaitlyn Morgan takes HCTZ 25 mg daily as needed. The current medical regimen is effective;  continue present plan and medications.  4. Postmenopausal She is on Vivelle-DOT.  Women with obesity may experience more severe vasomotor symptoms at menopause than women with overweight or normal weight.  We  will continue to monitor symptoms as they relate to her weight loss journey.  5. At risk for deficient intake of food Kaitlyn Morgan was given extensive education and counseling today of more than 8 minutes on risks associated with deficient food intake.  Counseled her on the importance of following our prescribed meal plan and eating adequate amounts of protein.  Discussed with Kaitlyn Morgan that inadequate food intake over longer periods of time can slow their metabolism down significantly.   6. Class 1 obesity with serious comorbidity and body mass index (BMI) of 34.0 to 34.9 in adult, unspecified obesity type  Course: Kaitlyn Morgan is currently in the action stage of change. As such, her goal is to continue with weight loss efforts.   Nutrition goals: She has agreed to keeping a food journal and adhering to recommended goals of 1000 calories and 85 grams of protein/soft/liquid/low residue.   Exercise goals: No exercise has been prescribed at this time.  Behavioral modification strategies: decreasing simple carbohydrates, increasing water intake and keeping a strict food journal.  Kaitlyn Morgan has agreed to follow-up with our clinic in 3 weeks. She was informed of the importance of frequent follow-up visits to maximize her success with intensive lifestyle modifications for her multiple health conditions.   Objective:   Blood pressure 119/77, pulse 74, temperature 97.6 F (36.4 C), temperature source Oral, height 5\' 2"  (1.575 m), weight 187 lb (84.8 kg), SpO2 99 %. Body mass index is 34.2 kg/m.  General: Cooperative, alert, well developed, in no acute distress. HEENT: Conjunctivae and lids unremarkable. Cardiovascular: Regular rhythm.  Lungs: Normal work of breathing. Neurologic: No focal deficits.   Lab Results  Component Value  Date   CREATININE 0.81 05/18/2020   BUN 17 05/18/2020   NA 141 05/18/2020   K 3.9 05/18/2020   CL 99 05/18/2020   CO2 25 05/18/2020   Lab Results  Component Value  Date   ALT 12 05/18/2020   AST 18 05/18/2020   ALKPHOS 88 05/18/2020   BILITOT 0.8 05/18/2020   Lab Results  Component Value Date   HGBA1C 4.9 05/18/2020   HGBA1C 5.1 10/29/2019   HGBA1C 5.0 03/14/2016   Lab Results  Component Value Date   INSULIN 7.5 05/18/2020   INSULIN 14.7 10/29/2019   Lab Results  Component Value Date   TSH 3.860 10/29/2019   Lab Results  Component Value Date   CHOL 187 05/18/2020   HDL 59 05/18/2020   LDLCALC 117 (H) 05/18/2020   TRIG 60 05/18/2020   CHOLHDL 3.6 10/29/2019   Lab Results  Component Value Date   WBC 3.7 05/18/2020   HGB 14.3 05/18/2020   HCT 42.9 05/18/2020   MCV 91 05/18/2020   PLT 263 05/18/2020   Lab Results  Component Value Date   IRON 59 05/18/2020   TIBC 362 05/18/2020   FERRITIN 70 05/18/2020   Attestation Statements:   Reviewed by clinician on day of visit: allergies, medications, problem list, medical history, surgical history, family history, social history, and previous encounter notes.  I, Water quality scientist, CMA, am acting as transcriptionist for Briscoe Deutscher, DO  I have reviewed the above documentation for accuracy and completeness, and I agree with the above. Briscoe Deutscher, DO

## 2020-11-11 ENCOUNTER — Encounter (INDEPENDENT_AMBULATORY_CARE_PROVIDER_SITE_OTHER): Payer: Self-pay

## 2020-11-11 ENCOUNTER — Ambulatory Visit (INDEPENDENT_AMBULATORY_CARE_PROVIDER_SITE_OTHER): Payer: 59 | Admitting: Family Medicine

## 2020-11-14 ENCOUNTER — Other Ambulatory Visit: Payer: Self-pay | Admitting: Family Medicine

## 2020-11-14 DIAGNOSIS — R600 Localized edema: Secondary | ICD-10-CM

## 2020-11-20 ENCOUNTER — Other Ambulatory Visit: Payer: Self-pay

## 2020-11-20 ENCOUNTER — Encounter: Payer: Self-pay | Admitting: Family Medicine

## 2020-11-20 ENCOUNTER — Ambulatory Visit (INDEPENDENT_AMBULATORY_CARE_PROVIDER_SITE_OTHER): Payer: 59 | Admitting: Family Medicine

## 2020-11-20 VITALS — BP 127/84 | HR 74 | Temp 98.0°F | Resp 18 | Ht 62.0 in | Wt 198.0 lb

## 2020-11-20 DIAGNOSIS — R198 Other specified symptoms and signs involving the digestive system and abdomen: Secondary | ICD-10-CM | POA: Diagnosis not present

## 2020-11-20 DIAGNOSIS — Z87442 Personal history of urinary calculi: Secondary | ICD-10-CM

## 2020-11-20 DIAGNOSIS — R1033 Periumbilical pain: Secondary | ICD-10-CM | POA: Insufficient documentation

## 2020-11-20 LAB — POC URINALSYSI DIPSTICK (AUTOMATED)
Bilirubin, UA: NEGATIVE
Blood, UA: NEGATIVE
Glucose, UA: NEGATIVE
Ketones, UA: NEGATIVE
Leukocytes, UA: NEGATIVE
Nitrite, UA: NEGATIVE
Protein, UA: NEGATIVE
Spec Grav, UA: 1.015 (ref 1.010–1.025)
Urobilinogen, UA: 0.2 E.U./dL
pH, UA: 6 (ref 5.0–8.0)

## 2020-11-20 NOTE — Assessment & Plan Note (Signed)
Strain urine  May have passed it already

## 2020-11-20 NOTE — Patient Instructions (Signed)
Kidney Stones  Kidney stones are solid, rock-like deposits that form inside of the kidneys. The kidneys are a pair of organs that make urine. A kidney stone may form in a kidney and move into other parts of the urinary tract, including the tubes that connect the kidneys to the bladder (ureters), the bladder, and the tube that carries urine out of the body (urethra). As the stone moves through these areas, it can cause intense pain and block the flow of urine. Kidney stones are created when high levels of certain minerals are found in the urine. The stones are usually passed out of the body through urination, but in some cases, medical treatment may be needed to remove them. What are the causes? Kidney stones may be caused by:  A condition in which certain glands produce too much parathyroid hormone (primary hyperparathyroidism), which causes too much calcium buildup in the blood.  A buildup of uric acid crystals in the bladder (hyperuricosuria). Uric acid is a chemical that the body produces when you eat certain foods. It usually exits the body in the urine.  Narrowing (stricture) of one or both of the ureters.  A kidney blockage that is present at birth (congenital obstruction).  Past surgery on the kidney or the ureters, such as gastric bypass surgery. What increases the risk? The following factors may make you more likely to develop this condition:  Having had a kidney stone in the past.  Having a family history of kidney stones.  Not drinking enough water.  Eating a diet that is high in protein, salt (sodium), or sugar.  Being overweight or obese. What are the signs or symptoms? Symptoms of a kidney stone may include:  Pain in the side of the abdomen, right below the ribs (flank pain). Pain usually spreads (radiates) to the groin.  Needing to urinate frequently or urgently.  Painful urination.  Blood in the urine (hematuria).  Nausea.  Vomiting.  Fever and chills. How  is this diagnosed? This condition may be diagnosed based on:  Your symptoms and medical history.  A physical exam.  Blood tests.  Urine tests. These may be done before and after the stone passes out of your body through urination.  Imaging tests, such as a CT scan, abdominal X-ray, or ultrasound.  A procedure to examine the inside of the bladder (cystoscopy). How is this treated? Treatment for kidney stones depends on the size, location, and makeup of the stones. Kidney stones will often pass out of the body through urination. You may need to:  Increase your fluid intake to help pass the stone. In some cases, you may be given fluids through an IV and may need to be monitored at the hospital.  Take medicine for pain.  Make changes in your diet to help prevent kidney stones from coming back. Sometimes, medical procedures are needed to remove a kidney stone. This may involve:  A procedure to break up kidney stones using: ? A focused beam of light (laser therapy). ? Shock waves (extracorporeal shock wave lithotripsy).  Surgery to remove kidney stones. This may be needed if you have severe pain or have stones that block your urinary tract. Follow these instructions at home: Medicines  Take over-the-counter and prescription medicines only as told by your health care provider.  Ask your health care provider if the medicine prescribed to you requires you to avoid driving or using heavy machinery. Eating and drinking  Drink enough fluid to keep your urine pale yellow.   You may be instructed to drink at least 8-10 glasses of water each day. This will help you pass the kidney stone.  If directed, change your diet. This may include: ? Limiting how much sodium you eat. ? Eating more fruits and vegetables. ? Limiting how much animal protein--such as red meat, poultry, fish, and eggs--you eat.  Follow instructions from your health care provider about eating or drinking  restrictions. General instructions  Collect urine samples as told by your health care provider. You may need to collect a urine sample: ? 24 hours after you pass the stone. ? 8-12 weeks after passing the kidney stone, and every 6-12 months after that.  Strain your urine every time you urinate, for as long as directed. Use the strainer that your health care provider recommends.  Do not throw out the kidney stone after passing it. Keep the stone so it can be tested by your health care provider. Testing the makeup of your kidney stone may help prevent you from getting kidney stones in the future.  Keep all follow-up visits as told by your health care provider. This is important. You may need follow-up X-rays or ultrasounds to make sure that your stone has passed. How is this prevented? To prevent another kidney stone:  Drink enough fluid to keep your urine pale yellow. This is the best way to prevent kidney stones.  Eat a healthy diet and follow recommendations from your health care provider about foods to avoid. You may be instructed to eat a low-protein diet. Recommendations vary depending on the type of kidney stone that you have.  Maintain a healthy weight.   Where to find more information  [place] ([facility]): www.[email or web address].[email or web address]): www.urologyhealth.org Contact a health care provider if:  You have pain that gets worse or does not get better with medicine. Get help right away if:  You have a fever or chills.  You develop severe pain.  You develop new abdominal pain.  You faint.  You are unable to urinate. Summary  Kidney stones are solid, rock-like deposits that form inside of the kidneys.  Kidney stones can cause nausea, vomiting, blood in the urine, abdominal pain, and the urge to urinate frequently.  Treatment for kidney stones depends on the size, location, and makeup of the stones. Kidney stones will often pass out of the body  through urination.  Kidney stones can be prevented by drinking enough fluids, eating a healthy diet, and maintaining a healthy weight. This information is not intended to replace advice given to you by your health care provider. Make sure you discuss any questions you have with your health care provider. Document Revised: 12/18/2018 Document Reviewed: 12/18/2018 Elsevier Patient Education  2021 [place]. Abdominal Pain, Adult Pain in the abdomen (abdominal pain) can be caused by many things. Often, abdominal pain is not serious and it gets better with no treatment or by being treated at home. However, sometimes abdominal pain is serious. Your health care provider will ask questions about your medical history and do a physical exam to try to determine the cause of your abdominal pain. Follow these instructions at home: Medicines  Take over-the-counter and prescription medicines only as told by your health care provider.  Do not take a laxative unless told by your health care provider. General instructions  Watch your condition for any changes.  Drink enough fluid to keep your urine pale yellow.  Keep all follow-up visits as told by your health  care provider. This is important.   Contact a health care provider if:  Your abdominal pain changes or gets worse.  You are not hungry or you lose weight without trying.  You are constipated or have diarrhea for more than 2-3 days.  You have pain when you urinate or have a bowel movement.  Your abdominal pain wakes you up at night.  Your pain gets worse with meals, after eating, or with certain foods.  You are vomiting and cannot keep anything down.  You have a fever.  You have blood in your urine. Get help right away if:  Your pain does not go away as soon as your health care provider told you to expect.  You cannot stop vomiting.  Your pain is only in areas of the abdomen, such as the right side or the left lower portion of  the abdomen. Pain on the right side could be caused by appendicitis.  You have bloody or black stools, or stools that look like tar.  You have severe pain, cramping, or bloating in your abdomen.  You have signs of dehydration, such as: ? Dark urine, very little urine, or no urine. ? Cracked lips. ? Dry mouth. ? Sunken eyes. ? Sleepiness. ? Weakness.  You have trouble breathing or chest pain. Summary  Often, abdominal pain is not serious and it gets better with no treatment or by being treated at home. However, sometimes abdominal pain is serious.  Watch your condition for any changes.  Take over-the-counter and prescription medicines only as told by your health care provider.  Contact a health care provider if your abdominal pain changes or gets worse.  Get help right away if you have severe pain, cramping, or bloating in your abdomen. This information is not intended to replace advice given to you by your health care provider. Make sure you discuss any questions you have with your health care provider. Document Revised: 09/20/2019 Document Reviewed: 12/10/2018 Elsevier Patient Education  [place].

## 2020-11-20 NOTE — Progress Notes (Signed)
Patient ID: Kaitlyn Morgan, female    DOB: 1966/07/01  Age: 55 y.o. MRN: [id number]    Subjective:  Subjective  HPI Kaitlyn Morgan presents for an office visit today. She complains of navel pain. She describe the pain as pitching and pulling that started 2 days ago. She reports that she has been using a stomach belt to relieve her pain, however it did not relieve her symptoms. She states she has been seeing drainage inside her belly button. She notes that after eating she experiences pressure, bloating, and headache. She also complains of passing a kidney stone. She endorses having mid to lower back pain secondary to passing kidney stone. She describes the pain as labor contractions and puling sensation, resulting in her being nauseated for 3 days. The following week after passing the stone. She still experiences back pain and nausea. However the pulling sensation is absent. She denies any chest pain, SOB, fever, cough, chills, sore throat, urinary incontinence, or V/D at this time.   Review of Systems  Constitutional: Negative for chills, fatigue and fever.  HENT: Negative for ear pain, sinus pressure, sinus pain and sore throat.   Eyes: Negative for pain.  Respiratory: Negative for cough and shortness of breath.   Cardiovascular: Negative for chest pain, palpitations and leg swelling.  Gastrointestinal: Positive for nausea (Secondary to passing kidney stone). Negative for blood in stool, constipation, diarrhea and vomiting.       (+) navel pain (+) Pressure on the upper abdominal (+) bloating (+) discharge in navel  Musculoskeletal: Positive for back pain (mid to lower back secondary to passing kidney stone ).  Neurological: Positive for headaches (Secondary to navel pain and eating ). Negative for dizziness and weakness.    History Past Medical History:  Diagnosis Date  . Anemia    during pregnancy, not now.  . Arthritis    osteoarthritis-neck"some issues with cervical vertebrae"   . Asthma   . Atypical syncope    1 episode -never had follow up with cardiology-never any further episodes, did go to urgent care.  . Back pain   . Colon cancer [facility])    rectal cancer Radiation and oral chemo with radiation.  . Complication of anesthesia   . Constipation   . Edema, lower extremity   . Family history of breast cancer   . Family history of uterine cancer   . Genital lesion, female    at times  . Heart burn    occ.  Marland Kitchen Hx of seasonal allergies   . Joint pain   . Motion sickness   . PONV (postoperative nausea and vomiting)   . Rectal cancer ([place])   . Shortness of breath   . Swallowing difficulty   . Umbilicus discharge     She has a past surgical history that includes Cesarean section; Tubal ligation; Abdominal hysterectomy; Dilation and curettage of uterus; Diagnostic laparoscopy; XI robotic assisted lower anterior resection (N/A, 03/21/2016); and Colon surgery.   Her family history includes Alcoholism in her father; Breast cancer in her maternal grandmother; COPD in her father; Cancer (age of onset: 49) in her paternal grandmother; Heart attack in her paternal grandfather; Hypertension in her father, paternal grandfather, sister, and sister; Skin cancer in her cousin; Supraventricular tachycardia in her mother.She reports that she has never smoked. She has never used smokeless tobacco. She reports that she does not drink alcohol and does not use drugs.  Current Outpatient Medications on File Prior to Visit  Medication Sig Dispense Refill  .  aspirin EC 81 MG tablet Take 1 tablet (81 mg total) by mouth daily. 30 tablet 0  . hydrochlorothiazide (HYDRODIURIL) 25 MG tablet TAKE 1 TABLET(25 MG) BY MOUTH DAILY 90 tablet 3  . Multiple Vitamins-Minerals (ADULT GUMMY) CHEW Chew 4-6 capsules by mouth daily.    . phentermine (ADIPEX-P) 37.5 MG tablet 1/2 tablet before breakfast, 1/2 tablet at noon 30 tablet 0  . VIVELLE-DOT 0.1 MG/24HR patch 1 patch 2 (two) times a week.     No  current facility-administered medications on file prior to visit.     Objective:  Objective  Physical Exam Vitals and nursing note reviewed.  Constitutional:      General: She is not in acute distress.    Appearance: Normal appearance. She is well-developed. She is not ill-appearing.  HENT:     Head: Normocephalic and atraumatic.     Right Ear: External ear normal.     Left Ear: External ear normal.     Nose: Nose normal.  Eyes:     Extraocular Movements: Extraocular movements intact.     Pupils: Pupils are equal, round, and reactive to light.  Cardiovascular:     Rate and Rhythm: Normal rate and regular rhythm.     Pulses: Normal pulses.     Heart sounds: Normal heart sounds. No murmur heard. No friction rub. No gallop.   Pulmonary:     Effort: Pulmonary effort is normal. No respiratory distress.     Breath sounds: Normal breath sounds. No wheezing, rhonchi or rales.  Abdominal:     General: Bowel sounds are normal. There is no distension.     Palpations: Abdomen is soft.     Tenderness: There is abdominal tenderness in the periumbilical area.     Hernia: No hernia is present.     Comments: There is abdominal tenderness present around the preiumbical region with discharge in the umbilicus. Swab done for culture  Musculoskeletal:        General: Normal range of motion.     Cervical back: Normal range of motion and neck supple.  Skin:    General: Skin is warm and dry.  Neurological:     Mental Status: She is alert and oriented to person, place, and time.  Psychiatric:        Behavior: Behavior normal.        Thought Content: Thought content normal.    BP 127/84 (BP Location: Left Arm, Patient Position: Sitting, Cuff Size: Normal)   Pulse 74   Temp 98 F (36.7 C) (Oral)   Resp 18   Ht 5\' 2"  (1.575 m)   Wt 198 lb (89.8 kg)   SpO2 100%   BMI 36.21 kg/m  Wt Readings from Last 3 Encounters:  11/20/20 198 lb (89.8 kg)  10/22/20 187 lb (84.8 kg)  07/28/20 181 lb  (82.1 kg)     Lab Results  Component Value Date   WBC 3.7 05/18/2020   HGB 14.3 05/18/2020   HCT 42.9 05/18/2020   PLT 263 05/18/2020   GLUCOSE 79 05/18/2020   CHOL 187 05/18/2020   TRIG 60 05/18/2020   HDL 59 05/18/2020   LDLCALC 117 (H) 05/18/2020   ALT 12 05/18/2020   AST 18 05/18/2020   NA 141 05/18/2020   K 3.9 05/18/2020   CL 99 05/18/2020   CREATININE 0.81 05/18/2020   BUN 17 05/18/2020   CO2 25 05/18/2020   TSH 3.860 10/29/2019   HGBA1C 4.9 05/18/2020    CT  Abdomen Pelvis W Contrast  Result Date: 07/15/2020 CLINICAL DATA:  55 year old female with umbilical pain and discharge. EXAM: CT ABDOMEN AND PELVIS WITH CONTRAST TECHNIQUE: Multidetector CT imaging of the abdomen and pelvis was performed using the standard protocol following bolus administration of intravenous contrast. CONTRAST:  156mL ISOVUE-300 IOPAMIDOL (ISOVUE-300) INJECTION 61% COMPARISON:  CT abdomen pelvis dated 04/26/2019. FINDINGS: Lower chest: The visualized lung bases are clear. No intra-abdominal free air or free fluid. Hepatobiliary: No focal liver abnormality is seen. No gallstones, gallbladder wall thickening, or biliary dilatation. Pancreas: Unremarkable. No pancreatic ductal dilatation or surrounding inflammatory changes. Spleen: There is a 2 cm hypodense lesion in the inferior pole of the spleen, likely a cyst or hemangioma. Adrenals/Urinary Tract: The adrenal glands unremarkable. There is a 4 mm left renal inferior pole nonobstructing stone. There is no hydronephrosis on either side. There is symmetric enhancement and excretion of contrast by both kidneys. The visualized ureters and urinary bladder appear unremarkable. Stomach/Bowel: Postsurgical changes of partial CT resection with anastomotic suture. There is no bowel obstruction or active inflammation. The appendix is normal. Vascular/Lymphatic: The abdominal aorta and IVC are unremarkable. No portal venous gas. There is no adenopathy. Reproductive:  Hysterectomy. No adnexal masses. Other: None Musculoskeletal: No acute or significant osseous findings. IMPRESSION: 1. No acute intra-abdominal or pelvic pathology. No bowel obstruction. Normal appendix. 2. A 4 mm nonobstructing left renal inferior pole stone. No hydronephrosis. Electronically Signed   By: Anner Crete M.D.   On: 07/15/2020 19:52     Assessment & Plan:  Plan    No orders of the defined types were placed in this encounter.   Problem List Items Addressed This Visit      Unprioritized   History of kidney stones    Strain urine  May have passed it already      Relevant Orders   POCT Urinalysis Dipstick (Automated) (Completed)   Periumbilical abdominal pain - Primary    Refer to surgery       Relevant Orders   Ambulatory referral to General Surgery   CBC with Differential/Platelet   Comprehensive metabolic panel   Amylase   Lipase   Umbilical discharge    Culture done      Relevant Orders   Ambulatory referral to General Surgery   CBC with Differential/Platelet   Comprehensive metabolic panel   Amylase   Lipase   WOUND CULTURE      Follow-up: Return if symptoms worsen or fail to improve.   I,Gordon Zheng,acting as a Education administrator for [employer], DO.,have documented all relevant documentation on the behalf of [name], DO,as directed by  [name], DO while in the presence of [place], DO, have reviewed all documentation for this visit. The documentation on 11/20/20 for the exam, diagnosis, procedures, and orders are all accurate and complete.

## 2020-11-20 NOTE — Assessment & Plan Note (Signed)
Culture done

## 2020-11-20 NOTE — Assessment & Plan Note (Signed)
Refer to surgery 

## 2020-11-21 LAB — CBC WITH DIFFERENTIAL/PLATELET
Absolute Monocytes: 389 cells/uL (ref 200–950)
Basophils Absolute: 48 cells/uL (ref 0–200)
Basophils Relative: 1 %
Eosinophils Absolute: 67 cells/uL (ref 15–500)
Eosinophils Relative: 1.4 %
HCT: 40.2 % (ref 35.0–45.0)
Hemoglobin: 14 g/dL (ref 11.7–15.5)
Lymphs Abs: 1450 cells/uL (ref 850–3900)
MCH: 31.2 pg (ref 27.0–33.0)
MCHC: 34.8 g/dL (ref 32.0–36.0)
MCV: 89.5 fL (ref 80.0–100.0)
MPV: 9.5 fL (ref 7.5–12.5)
Monocytes Relative: 8.1 %
Neutro Abs: 2846 cells/uL (ref 1500–7800)
Neutrophils Relative %: 59.3 %
Platelets: 281 10*3/uL (ref 140–400)
RBC: 4.49 10*6/uL (ref 3.80–5.10)
RDW: 13.3 % (ref 11.0–15.0)
Total Lymphocyte: 30.2 %
WBC: 4.8 10*3/uL (ref 3.8–10.8)

## 2020-11-21 LAB — COMPREHENSIVE METABOLIC PANEL
AG Ratio: 1.7 (calc) (ref 1.0–2.5)
ALT: 11 U/L (ref 6–29)
AST: 18 U/L (ref 10–35)
Albumin: 4.8 g/dL (ref 3.6–5.1)
Alkaline phosphatase (APISO): 72 U/L (ref 37–153)
BUN: 12 mg/dL (ref 7–25)
CO2: 25 mmol/L (ref 20–32)
Calcium: 10 mg/dL (ref 8.6–10.4)
Chloride: 104 mmol/L (ref 98–110)
Creat: 0.82 mg/dL (ref 0.50–1.05)
Globulin: 2.9 g/dL (calc) (ref 1.9–3.7)
Glucose, Bld: 78 mg/dL (ref 65–99)
Potassium: 4.1 mmol/L (ref 3.5–5.3)
Sodium: 145 mmol/L (ref 135–146)
Total Bilirubin: 0.8 mg/dL (ref 0.2–1.2)
Total Protein: 7.7 g/dL (ref 6.1–8.1)

## 2020-11-21 LAB — AMYLASE: Amylase: 41 U/L (ref 21–101)

## 2020-11-21 LAB — LIPASE: Lipase: 25 U/L (ref 7–60)

## 2020-11-23 ENCOUNTER — Other Ambulatory Visit: Payer: Self-pay

## 2020-11-23 DIAGNOSIS — R1033 Periumbilical pain: Secondary | ICD-10-CM

## 2020-11-23 DIAGNOSIS — R198 Other specified symptoms and signs involving the digestive system and abdomen: Secondary | ICD-10-CM

## 2020-11-23 LAB — WOUND CULTURE
MICRO NUMBER:: 11748575
SPECIMEN QUALITY:: ADEQUATE

## 2020-11-23 MED ORDER — CEPHALEXIN 500 MG PO CAPS: 500.0000 mg | ORAL_CAPSULE | Freq: Two times a day (BID) | ORAL | 0 refills | Status: AC

## 2020-12-07 ENCOUNTER — Other Ambulatory Visit: Payer: Self-pay

## 2020-12-07 ENCOUNTER — Telehealth (INDEPENDENT_AMBULATORY_CARE_PROVIDER_SITE_OTHER): Payer: 59 | Admitting: Family Medicine

## 2020-12-07 ENCOUNTER — Encounter (INDEPENDENT_AMBULATORY_CARE_PROVIDER_SITE_OTHER): Payer: Self-pay | Admitting: Family Medicine

## 2020-12-07 DIAGNOSIS — R1033 Periumbilical pain: Secondary | ICD-10-CM | POA: Diagnosis not present

## 2020-12-07 DIAGNOSIS — R632 Polyphagia: Secondary | ICD-10-CM

## 2020-12-07 DIAGNOSIS — R600 Localized edema: Secondary | ICD-10-CM

## 2020-12-07 DIAGNOSIS — Z6838 Body mass index (BMI) 38.0-38.9, adult: Secondary | ICD-10-CM

## 2020-12-08 MED ORDER — PHENTERMINE HCL 37.5 MG PO TABS: ORAL_TABLET | ORAL | 0 refills | Status: DC

## 2020-12-08 NOTE — Progress Notes (Signed)
TeleHealth Visit:  Due to the COVID-19 pandemic, this visit was completed with telemedicine (audio/video) technology to reduce patient and provider exposure as well as to preserve personal protective equipment.   Kaitlyn Morgan has verbally consented to this TeleHealth visit. The patient is located at home, the provider is located at the [facility] office. The participants in this visit include the listed provider and patient and. The visit was conducted today via MyChart video.  OBESITY Kaitlyn Morgan is here to discuss her progress with her obesity treatment plan along with follow-up of her obesity related diagnoses.   Today's visit was #: 15 Starting weight: 210 lbs Starting date: 10/29/2019 Today's date: 12/07/2020  Interim History: Kaitlyn Morgan has been having increasing periumbilical pain.  Reviewed note by Dr. Etter Sjogren.  Nutrition Plan: the Category 1 Plan most of the time.  Anti-obesity medications: phentermine 18.75 mg twice daily. Reported side effects: None. Activity: Increased activity.  Assessment/Plan:   1. Polyphagia Controlled. Current treatment: phentermine 18.75 mg twice daily. Polyphagia refers to excessive feelings of hunger. She will continue to focus on protein-rich, low simple carbohydrate foods. We reviewed the importance of hydration, regular exercise for stress reduction, and restorative sleep.  - Refill phentermine (ADIPEX-P) 37.5 MG tablet; 1/2 tablet before breakfast, 1/2 tablet at noon  Dispense: 30 tablet; Refill: 0  Having again reminded the patient of the "off label" use of Phentermine beyond three consecutive months, and again discussing the risks, benefits, contraindications, and limitations of it's use; given it's role in the successful treatment of obesity thus far and lack of adverse effect, patient has expressed desire and given informed verbal consent to continue use.   I have consulted the [place] Controlled Substances Registry for this patient, and feel  the risk/benefit ratio today is favorable for proceeding with this prescription for a controlled substance. The patient understands monitoring parameters and red flags.   2. Periumbilical abdominal pain Worsening. Reviewed recent visit with PCP. Encouraged appointment with General Surgery.  3. Bilateral lower extremity edema Continue HCTZ 25 mg daily.  4. Obesity, current BMI 34.2  Kaitlyn Morgan is currently in the action stage of change. As such, her goal is to continue with weight loss efforts. She has agreed to the Category 1 Plan.   Exercise goals: As tolerated.  Behavioral modification strategies: increasing lean protein intake, decreasing simple carbohydrates, increasing vegetables and increasing water intake.  Kaitlyn Morgan has agreed to follow-up with our clinic in 3 weeks for virtual visit. She was informed of the importance of frequent follow-up visits to maximize her success with intensive lifestyle modifications for her multiple health conditions.  Objective:   VITALS: Per patient if applicable, see vitals. GENERAL: Alert and in no acute distress. CARDIOPULMONARY: No increased WOB. Speaking in clear sentences.  PSYCH: Pleasant and cooperative. Speech normal rate and rhythm. Affect is appropriate. Insight and judgement are appropriate. Attention is focused, linear, and appropriate.  NEURO: Oriented as arrived to appointment on time with no prompting.   Lab Results  Component Value Date   CREATININE 0.82 11/20/2020   BUN 12 11/20/2020   NA 145 11/20/2020   K 4.1 11/20/2020   CL 104 11/20/2020   CO2 25 11/20/2020   Lab Results  Component Value Date   ALT 11 11/20/2020   AST 18 11/20/2020   ALKPHOS 88 05/18/2020   BILITOT 0.8 11/20/2020   Lab Results  Component Value Date   HGBA1C 4.9 05/18/2020   HGBA1C 5.1 10/29/2019   HGBA1C 5.0 03/14/2016  Lab Results  Component Value Date   INSULIN 7.5 05/18/2020   INSULIN 14.7 10/29/2019   Lab Results  Component Value Date    TSH 3.860 10/29/2019   Lab Results  Component Value Date   CHOL 187 05/18/2020   HDL 59 05/18/2020   LDLCALC 117 (H) 05/18/2020   TRIG 60 05/18/2020   CHOLHDL 3.6 10/29/2019   Lab Results  Component Value Date   WBC 4.8 11/20/2020   HGB 14.0 11/20/2020   HCT 40.2 11/20/2020   MCV 89.5 11/20/2020   PLT 281 11/20/2020   Lab Results  Component Value Date   IRON 59 05/18/2020   TIBC 362 05/18/2020   FERRITIN 70 05/18/2020    Attestation Statements:   Reviewed by clinician on day of visit: allergies, medications, problem list, medical history, surgical history, family history, social history, and previous encounter notes.  I, Water quality scientist, CMA, am acting as transcriptionist for Briscoe Deutscher, DO  I have reviewed the above documentation for accuracy and completeness, and I agree with the above. Briscoe Deutscher, DO

## 2020-12-25 ENCOUNTER — Encounter: Payer: Self-pay | Admitting: Family

## 2020-12-25 ENCOUNTER — Other Ambulatory Visit: Payer: Self-pay

## 2020-12-25 ENCOUNTER — Ambulatory Visit (INDEPENDENT_AMBULATORY_CARE_PROVIDER_SITE_OTHER): Payer: 59 | Admitting: Family

## 2020-12-25 VITALS — BP 122/78 | HR 67 | Temp 98.1°F | Ht 62.0 in | Wt 201.8 lb

## 2020-12-25 DIAGNOSIS — L03114 Cellulitis of left upper limb: Secondary | ICD-10-CM

## 2020-12-25 DIAGNOSIS — R519 Headache, unspecified: Secondary | ICD-10-CM | POA: Diagnosis not present

## 2020-12-25 LAB — COMPREHENSIVE METABOLIC PANEL
ALT: 12 U/L (ref 0–35)
AST: 18 U/L (ref 0–37)
Albumin: 4.5 g/dL (ref 3.5–5.2)
Alkaline Phosphatase: 74 U/L (ref 39–117)
BUN: 13 mg/dL (ref 6–23)
CO2: 32 mEq/L (ref 19–32)
Calcium: 9.5 mg/dL (ref 8.4–10.5)
Chloride: 104 mEq/L (ref 96–112)
Creatinine, Ser: 0.8 mg/dL (ref 0.40–1.20)
GFR: 84.06 mL/min (ref >60.00)
Glucose, Bld: 86 mg/dL (ref 70–99)
Potassium: 4.1 mEq/L (ref 3.5–5.1)
Sodium: 143 mEq/L (ref 135–145)
Total Bilirubin: 0.6 mg/dL (ref 0.2–1.2)
Total Protein: 7.1 g/dL (ref 6.0–8.3)

## 2020-12-25 LAB — CBC WITH DIFFERENTIAL/PLATELET
Basophils Absolute: 0 10*3/uL (ref 0.0–0.1)
Basophils Relative: 1.1 % (ref 0.0–3.0)
Eosinophils Absolute: 0.1 10*3/uL (ref 0.0–0.7)
Eosinophils Relative: 2.4 % (ref 0.0–5.0)
HCT: 39.6 % (ref 36.0–46.0)
Hemoglobin: 13.5 g/dL (ref 12.0–15.0)
Lymphocytes Relative: 30.8 % (ref 12.0–46.0)
Lymphs Abs: 1.3 10*3/uL (ref 0.7–4.0)
MCHC: 34 g/dL (ref 30.0–36.0)
MCV: 89.9 fl (ref 78.0–100.0)
Monocytes Absolute: 0.3 10*3/uL (ref 0.1–1.0)
Monocytes Relative: 8 % (ref 3.0–12.0)
Neutro Abs: 2.4 10*3/uL (ref 1.4–7.7)
Neutrophils Relative %: 57.7 % (ref 43.0–77.0)
Platelets: 261 10*3/uL (ref 150.0–400.0)
RBC: 4.4 Mil/uL (ref 3.87–5.11)
RDW: 13.5 % (ref 11.5–15.5)
WBC: 4.1 10*3/uL (ref 4.0–10.5)

## 2020-12-25 MED ORDER — DOXYCYCLINE HYCLATE 100 MG PO TABS: 100.0000 mg | ORAL_TABLET | Freq: Two times a day (BID) | ORAL | 0 refills | Status: DC

## 2020-12-25 NOTE — Progress Notes (Signed)
Kaitlyn Morgan is a 54 y.o. female with the following history as recorded in EpicCare:  Patient Active Problem List   Diagnosis Date Noted  . History of kidney stones 11/20/2020  . Umbilical discharge 11/20/2020  . Periumbilical abdominal pain 11/20/2020  . Lower extremity edema 10/25/2019  . 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 OSA (obstructive sleep apnea) 07/24/2019  . Wheezing 07/24/2019  . Atherosclerosis of aorta (HCC) 05/22/2019  . Other fatigue 05/22/2019  . Slow transit constipation 05/22/2019  . Palpitation 05/22/2019  . Gastroesophageal reflux disease 05/22/2019  . Morbid obesity (HCC) 05/22/2019  . Raynaud's disease 05/21/2019  . Diarrhea 05/11/2016  . Dehydration 05/11/2016  . Nausea with vomiting 05/11/2016  . Anxiety 05/11/2016  . Genetic testing 02/19/2016  . Family history of breast cancer   . Family history of uterine cancer   . Rectal cancer (HCC) 12/18/2015    Current Outpatient Medications  Medication Sig Dispense Refill  . hydrochlorothiazide (HYDRODIURIL) 25 MG tablet TAKE 1 TABLET(25 MG) BY MOUTH DAILY 90 tablet 3  . VIVELLE-DOT 0.1 MG/24HR patch 1 patch 2 (two) times a week.    . doxycycline (VIBRA-TABS) 100 MG tablet Take 1 tablet (100 mg total) by mouth 2 (two) times daily. 20 tablet 0   No current facility-administered medications for this visit.    Allergies: Codeine, Morphine and related, Sucralose, and Zolpidem tartrate  Past Medical History:  Diagnosis Date  . Anemia    during pregnancy, not now.  . 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 Asthma   . Atypical syncope    1 episode -never had follow up with cardiology-never any further episodes, did go to urgent care.  . Back pain   . Colon cancer (HCC)    rectal cancer Radiation and oral chemo with radiation.  . Complication of anesthesia   . Constipation   . Edema, lower extremity   . Family history of breast cancer   . Family history of uterine cancer    . Genital lesion, female    at times  . Heart burn    occ.  . Hx of seasonal allergies   . Joint pain   . Motion sickness   . PONV (postoperative nausea and vomiting)   . Rectal cancer (HCC)   . Shortness of breath   . Swallowing difficulty   . Umbilicus discharge     Past Surgical History:  Procedure Laterality Date  . 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 CESAREAN SECTION    . COLON SURGERY    . DIAGNOSTIC LAPAROSCOPY     with D and C and Ovarian cystectomy, tubal ligation done at this time '00  . DILATION AND CURETTAGE OF UTERUS    . TUBAL LIGATION    . XI ROBOTIC ASSISTED LOWER ANTERIOR RESECTION N/A 03/21/2016   Procedure: XI ROBOTIC ASSISTED LOWER ANTERIOR RESECTION;  Surgeon: Alicia Thomas, MD;  Location: [facility];  Service: General;  Laterality: N/A;    Family History  Problem Relation Age of Onset  . Breast cancer Maternal Grandmother        dx in her 60s  . Cancer Paternal Grandmother 60       uterine cancer   . Hypertension Father   . COPD Father   . Alcoholism Father   . Hypertension Sister   . Hypertension Paternal Grandfather   . Heart attack Paternal Grandfather   . Hypertension Sister   . Supraventricular   tachycardia Mother   . Skin cancer Cousin        maternal first cousin    Social History   Tobacco Use  . Smoking status: Never Smoker  . Smokeless tobacco: Never Used  Substance Use Topics  . Alcohol use: No    Alcohol/week: 0.0 standard drinks    Subjective:   1 week history of headache; concerned for insect bite as source of symptoms; localized area of redness noted on inner left arm; notes that headache has been "extremely painful" for the past week and is not prone to migraines; able to go to work this week; denies any vision changes or nausea or vomiting;   Objective:  Vitals:   12/25/20 1049  BP: 122/78  Pulse: 67  Temp: 98.1 F (36.7 C)  TempSrc: Oral  SpO2: 99%  Weight: 201 lb 12.8 oz (91.5 kg)   Height: 5' 2" (1.575 m)    General: Well developed, well nourished, in no acute distress  Skin : Warm and dry. Circular lesion noted on left inner arm with puncture wound noted in the center;  Head: Normocephalic and atraumatic  Eyes: Sclera and conjunctiva clear; pupils round and reactive to light; extraocular movements intact  Ears: External normal; canals clear; tympanic membranes normal  Oropharynx: Pink, supple. No suspicious lesions  Neck: Supple without thyromegaly, adenopathy  Lungs: Respirations unlabored; clear to auscultation bilaterally without wheeze, rales, rhonchi  CVS exam: normal rate and regular rhythm.  Neurologic: Alert and oriented; speech intact; face symmetrical; moves all extremities well; CNII-XII intact without focal deficit   Assessment:  1. Nonintractable headache, unspecified chronicity pattern, unspecified headache type   2. Cellulitis of left upper extremity     Plan:  ? If symptoms are all related to insect bite; check CBC, CMP today; Rx for Doxycycline 100 mg bid x 10 days; to consider head CT- unable to get scheduled prior to upcoming weekend; strict ER precautions discussed for upcoming weekend; follow up to be determined;  This visit occurred during the SARS-CoV-2 public health emergency.  Safety protocols were in place, including screening questions prior to the visit, additional usage of staff PPE, and extensive cleaning of exam room while observing appropriate contact time as indicated for disinfecting solutions.     No follow-ups on file.  Orders Placed This Encounter  Procedures  . CBC with Differential/Platelet  . Comp Met (CMET)    Requested Prescriptions   Signed Prescriptions Disp Refills  . doxycycline (VIBRA-TABS) 100 MG tablet 20 tablet 0    Sig: Take 1 tablet (100 mg total) by mouth 2 (two) times daily.

## 2020-12-28 ENCOUNTER — Other Ambulatory Visit: Payer: 59

## 2020-12-28 ENCOUNTER — Ambulatory Visit: Payer: 59 | Admitting: Hematology

## 2020-12-28 NOTE — Progress Notes (Incomplete)
Kaitlyn Morgan   [phone] [phone]   Clinic Follow up Note   Patient Care Team: Carollee Herter, Alferd Apa, DO as PCP - General (Family Medicine) Truitt Merle, MD as Consulting Physician (Hematology) Kyung Rudd, MD as Consulting Physician (Radiation Oncology) Tania Ade, RN as Registered Nurse Leighton Ruff, MD as Consulting Physician (General Surgery) Richmond Campbell, MD as Consulting Physician (Gastroenterology) Maisie Fus, MD as Consulting Physician (Obstetrics and Gynecology)  Date of Service:  12/28/2020  CHIEF COMPLAINT: F/u rectal cancer  SUMMARY OF ONCOLOGIC HISTORY: Oncology History Overview Note  Rectal cancer [facility])   Staging form: Colon and Rectum, AJCC 7th Edition     Clinical stage from 12/03/2015: Stage IIIB (T3, N1, M0) - Signed by Truitt Merle, MD on 12/18/2015     Rectal cancer ([place])  12/03/2015 Initial Diagnosis   Rectal cancer ([place])   12/03/2015 Procedure   Colonoscopy showed a partially obstructing tumor in the proximal rectum, its diameter measured 6 mm, biopsed   12/03/2015 Initial Biopsy   Rectum mass biopsy showed adenocarcinoma, moderately differentiated   12/10/2015 Imaging   CT abdomen and pelvis with contrast showed luminal narrowing and mural thickening in the proximal to mid rectum. There is a 6 mm short axis mesorectal lymph node suspicious for metastasis. There are several tiny indeterminate liver lesions    12/17/2015 Imaging   MRI pelvis with and without contrast showed a advanced T3 rectal adenocarcinoma (7cm), N1 disease (at least 2 left-sided nasal rectal lymph nodes are seen measuring 6-7 mm), distance from tumor to the sphincter is 8 cm   12/17/2015 Imaging   CT chest with contrast showed no evidence of metastasis or other changes   12/30/2015 - 02/08/2016 Radiation Therapy   neoadjuvant radiation to rectal cancer    12/30/2015 - 02/08/2016 Chemotherapy   xeloda 1500mg  twice daily with concurent radiation,  she tolerated well    03/15/2016 Imaging   CT AP IMPRESSION: 1. No acute findings within the abdomen or pelvis. No evidence for metastatic disease. 2. Mild wall thickening involving the distal sigmoid colon and rectum is identified. Nonspecific and may reflect sequelae of external beam radiation.   03/21/2016 Surgery   Rectosigmoid segmental resection    03/21/2016 Pathology Results   Invasive well differentiated adenocarcinoma, 5cm,  margins are negative, LVI (-), 1 out of 27 nodes were negative.     05/05/2016 - 05/05/2016 Adjuvant Chemotherapy   Adjuvant CAPOX, she had facial numbness and SOB after she completed the first oxaliplatin infusion, and refused more oxaliplatin    12/12/2016 Imaging   CT AP IMPRESSION: 1. Status post resection of rectal carcinoma. No complications identified and no specific features identified to suggest residual/recurrence of tumor along the suture line. 2. Stable tiny low-attenuation foci within the liver. This is too small to characterize. 3. No specific findings identified to suggest metastatic disease.   01/04/2018 Imaging   01/04/2018 CT Chest WO Contrast IMPRESSION: 1. Stable chest CT.  No findings of metastatic disease identified.  01/04/2018 MRI AP WO Contrast IMPRESSION: 1. Surgical changes within the high rectum. No evidence of metastatic disease. Mildly asymmetric soft tissue thickening in this area is likely treatment related. No evidence of locally recurrent disease on the colonoscopy of 2018. 2.  Aortic Atherosclerosis (ICD10-I70.0). 3. Mildly heterogeneous enhancement involving the sacrum is most likely related to prior radiation therapy   04/26/2019 Imaging   CT CAP W Contrast  IMPRESSION: 1. Postoperative findings in the rectum without evidence  of active malignancy. 2. 2 hypodense lesions in the spleen are stable and likely benign. 3.  Aortic Atherosclerosis (ICD10-I70.0). 4. Nonobstructive left nephrolithiasis.        CURRENT  THERAPY:  Surveillance  INTERVAL HISTORY: *** Kaitlyn Morgan is here for a follow up rectal cancer. She was last seen by me 1 year ago. She presents to the clinic alone.    REVIEW OF SYSTEMS:  *** Constitutional: Denies fevers, chills or abnormal weight loss Eyes: Denies blurriness of vision Ears, nose, mouth, throat, and face: Denies mucositis or sore throat Respiratory: Denies cough, dyspnea or wheezes Cardiovascular: Denies palpitation, chest discomfort or lower extremity swelling Gastrointestinal:  Denies nausea, heartburn or change in bowel habits Skin: Denies abnormal skin rashes Lymphatics: Denies new lymphadenopathy or easy bruising Neurological:Denies numbness, tingling or new weaknesses Behavioral/Psych: Mood is stable, no new changes  All other systems were reviewed with the patient and are negative.  MEDICAL HISTORY:  Past Medical History:  Diagnosis Date  . Anemia    during pregnancy, not now.  . 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 Asthma   . Atypical syncope    1 episode -never had follow up with cardiology-never any further episodes, did go to urgent care.  . Back pain   . Colon cancer [facility])    rectal cancer Radiation and oral chemo with radiation.  . Complication of anesthesia   . Constipation   . Edema, lower extremity   . Family history of breast cancer   . Family history of uterine cancer   . Genital lesion, female    at times  . Heart burn    occ.  Marland Kitchen Hx of seasonal allergies   . Joint pain   . Motion sickness   . PONV (postoperative nausea and vomiting)   . Rectal cancer ([place])   . Shortness of breath   . Swallowing difficulty   . Umbilicus discharge     SURGICAL HISTORY: Past Surgical History:  Procedure Laterality Date  . 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 CESAREAN SECTION    . COLON SURGERY    . DIAGNOSTIC LAPAROSCOPY     with D and C and Ovarian cystectomy, tubal  ligation done at this time '00  . DILATION AND CURETTAGE OF UTERUS    . TUBAL LIGATION    . XI ROBOTIC ASSISTED LOWER ANTERIOR RESECTION N/A 03/21/2016   Procedure: XI ROBOTIC ASSISTED LOWER ANTERIOR RESECTION;  Surgeon: Leighton Ruff, MD;  Location: [facility];  Service: General;  Laterality: N/A;    I have reviewed the social history and family history with the patient and they are unchanged from previous note.  ALLERGIES:  is allergic to codeine, morphine and related, sucralose, and zolpidem tartrate.  MEDICATIONS:  Current Outpatient Medications  Medication Sig Dispense Refill  . doxycycline (VIBRA-TABS) 100 MG tablet Take 1 tablet (100 mg total) by mouth 2 (two) times daily. 20 tablet 0  . hydrochlorothiazide (HYDRODIURIL) 25 MG tablet TAKE 1 TABLET(25 MG) BY MOUTH DAILY 90 tablet 3  . VIVELLE-DOT 0.1 MG/24HR patch 1 patch 2 (two) times a week.     No current facility-administered medications for this visit.    PHYSICAL EXAMINATION: ECOG PERFORMANCE STATUS: {CHL ONC ECOG [phone]}  There were no vitals filed for this visit. There were no vitals filed for this visit. *** GENERAL:alert, no distress and comfortable SKIN: skin color, texture, turgor are normal, no rashes or  significant lesions EYES: normal, Conjunctiva are pink and non-injected, sclera clear {OROPHARYNX:no exudate, no erythema and lips, buccal mucosa, and tongue normal}  NECK: supple, thyroid normal size, non-tender, without nodularity LYMPH:  no palpable lymphadenopathy in the cervical, axillary {or inguinal} LUNGS: clear to auscultation and percussion with normal breathing effort HEART: regular rate & rhythm and no murmurs and no lower extremity edema ABDOMEN:abdomen soft, non-tender and normal bowel sounds Musculoskeletal:no cyanosis of digits and no clubbing  NEURO: alert & oriented x 3 with fluent speech, no focal motor/sensory deficits  LABORATORY DATA:  I have reviewed the data as listed CBC Latest Ref  Rng & Units 12/25/2020 11/20/2020 05/18/2020  WBC 4.0 - 10.5 K/uL 4.1 4.8 3.7  Hemoglobin 12.0 - 15.0 g/dL 13.5 14.0 14.3  Hematocrit 36.0 - 46.0 % 39.6 40.2 42.9  Platelets 150.0 - 400.0 K/uL 261.0 281 263     CMP Latest Ref Rng & Units 12/25/2020 11/20/2020 05/18/2020  Glucose 70 - 99 mg/dL 86 78 79  BUN 6 - 23 mg/dL 13 12 17   Creatinine 0.40 - 1.20 mg/dL 0.80 0.82 0.81  Sodium 135 - 145 mEq/L 143 145 141  Potassium 3.5 - 5.1 mEq/L 4.1 4.1 3.9  Chloride 96 - 112 mEq/L 104 104 99  CO2 19 - 32 mEq/L 32 25 25  Calcium 8.4 - 10.5 mg/dL 9.5 10.0 9.7  Total Protein 6.0 - 8.3 g/dL 7.1 7.7 7.4  Total Bilirubin 0.2 - 1.2 mg/dL 0.6 0.8 0.8  Alkaline Phos 39 - 117 U/L 74 - 88  AST 0 - 37 U/L 18 18 18   ALT 0 - 35 U/L 12 11 12       RADIOGRAPHIC STUDIES: I have personally reviewed the radiological images as listed and agreed with the findings in the report. No results found.   ASSESSMENT & PLAN:  Jaonna Word is a 55 y.o. female with   1. Rectal adenocarcinoma, proximal rectum, cT3N1M0, ypT3N1aM0, stage IIIB, moderately differentiated -She was diagnosed on 12/03/2015. Shereceivedneoadjuvant chemo andradiation, finished 01/2016.  -Due to thehigh risk of cancer recurrence after surgery, I previouslyrecommend adjuvant chemo but she declined. -Her last colonoscopy was in 07/2018 with benign polyps removed. She will be due in 07/2020.  -From a rectal cancer standpoint, she is clinically doing well. Lab reviewed, her CBC and CMP are within normal limits except Cr 1.03, Protein 8.2. Her CEA is still pending. Her physical exam stable. There is no clinical concern for recurrence. -She is 4 years since her cancer diagnosis, her risk of recurrence has significantly decreased.  -F/u in 1 year.    2.Cancer Screenings,Genetictesting wasnegativefor pathogenetic mutations -I encouraged her to stay up to date with age appropriate cancer screenings such as yearly mammograms with her Gyn and Pap  smear every 2-3 years with her Gyn.  -Her last colonoscopy was in 02/2017.  3.Anxiety/Depression -She has high stress due to family issues -headaches may be related -Ipreviouslyreferredher to our social worker to start talking with someone.  Corn Discharge  -She has been treated with oral antibiotics but infection returned. Topical ointment did not work. She has been seen by Dermatologist who referred her back to GI as they suspect this is related to GI and not skin alone.  -She still has crusting with bleeding of naval. She keeps area clean. Naval appears clean on exam today with mild discharge (12/27/19). I do not have high suspicion this is related to her colon cancer.   5. Constipation  -Recent onset. She feels  she cannot completely empty her bowels.  -She has not taken stool softener but plans to start with dulcolax. She did not tolerate Miralax well before.  6. Weight Management, right leg pain  -She notes she recently started her [facility] weight [facility] wellness clinic. She has been able to lose some weight. She was placed on fluid pill.  -S/p PT her right leg pain has improved. She also follows up with [facility].    Plan -Lab and f/u in 1 year   No problem-specific Assessment & Plan notes found for this encounter.   No orders of the defined types were placed in this encounter.  All questions were answered. The patient knows to call the clinic with any problems, questions or concerns. No barriers to learning was detected. The total time spent in the appointment was {CHL ONC TIME VISIT - TLXBW:[id number]}.     Joslyn Devon 12/28/2020   Oneal Deputy, am acting as scribe for Truitt Merle, MD.   {Add scribe attestation statement}

## 2021-01-01 ENCOUNTER — Inpatient Hospital Stay: Payer: 59

## 2021-01-01 ENCOUNTER — Inpatient Hospital Stay: Payer: 59 | Admitting: Hematology

## 2021-01-05 ENCOUNTER — Ambulatory Visit (INDEPENDENT_AMBULATORY_CARE_PROVIDER_SITE_OTHER): Payer: Self-pay | Admitting: Family Medicine

## 2021-01-20 ENCOUNTER — Encounter (INDEPENDENT_AMBULATORY_CARE_PROVIDER_SITE_OTHER): Payer: Self-pay | Admitting: Family Medicine

## 2021-01-20 ENCOUNTER — Ambulatory Visit (INDEPENDENT_AMBULATORY_CARE_PROVIDER_SITE_OTHER): Payer: 59 | Admitting: Family Medicine

## 2021-01-20 ENCOUNTER — Other Ambulatory Visit: Payer: Self-pay

## 2021-01-20 VITALS — BP 124/85 | HR 78 | Temp 97.4°F | Ht 62.0 in | Wt 198.0 lb

## 2021-01-20 DIAGNOSIS — R632 Polyphagia: Secondary | ICD-10-CM

## 2021-01-20 DIAGNOSIS — Z9189 Other specified personal risk factors, not elsewhere classified: Secondary | ICD-10-CM | POA: Diagnosis not present

## 2021-01-20 DIAGNOSIS — R1033 Periumbilical pain: Secondary | ICD-10-CM

## 2021-01-20 DIAGNOSIS — R29898 Other symptoms and signs involving the musculoskeletal system: Secondary | ICD-10-CM

## 2021-01-20 DIAGNOSIS — Z6838 Body mass index (BMI) 38.0-38.9, adult: Secondary | ICD-10-CM

## 2021-01-22 ENCOUNTER — Telehealth: Payer: Self-pay | Admitting: Hematology

## 2021-01-22 ENCOUNTER — Other Ambulatory Visit: Payer: 59

## 2021-01-22 ENCOUNTER — Ambulatory Visit: Payer: 59 | Admitting: Hematology

## 2021-01-22 NOTE — Telephone Encounter (Signed)
Scheduled appointment per 06/10 sch msg. Patient is aware. 

## 2021-01-29 ENCOUNTER — Other Ambulatory Visit: Payer: Self-pay

## 2021-01-29 DIAGNOSIS — C20 Malignant neoplasm of rectum: Secondary | ICD-10-CM

## 2021-02-01 NOTE — Progress Notes (Signed)
Chief Complaint:   OBESITY Kaitlyn Morgan is here to discuss her progress with her obesity treatment plan along with follow-up of her obesity related diagnoses.   Today's visit was #: 85 Starting weight: 210 lbs Starting date: 10/29/2019 Today's weight: 198 lbs Today's date: 01/20/2021 Weight change since last visit: +11 lbs Total lbs lost to date: 12 lbs Body mass index is 36.21 kg/m.  Total weight loss percentage to date: -5.71%  Interim History:  [name] says she is feeling weak again.  She has been trying to do her own exercises.  She says she has "anxiety with steps".  She is still having a pinching sensation around her mid abdomen.   Current Meal Plan: the Category 1 Plan for 50% of the time.  Current Exercise Plan: Increased activity.   Assessment/Plan:   Orders Placed This Encounter  Procedures   Ambulatory referral to Physical Therapy   Ambulatory referral to General Surgery   1. Weakness of both hips Aleece has weakness in both hips and it is difficult for her to exercise on her own.  Will place a new referral to Physical Therapy today.  - Ambulatory referral to Physical Therapy  2. Periumbilical abdominal pain Improving.  History of rectal cancer status post surgery, chemo (oral), radiation. CT abdomen/pelvis on 07/15/2020 reassuring. The patient understands monitoring parameters and red flags.   - Ambulatory referral to General Surgery  3. Polyphagia Controlled.   Polyphagia refers to excessive feelings of hunger. She will continue to focus on protein-rich, low simple carbohydrate foods. We reviewed the importance of hydration, regular exercise for stress reduction, and restorative sleep.  4. At risk for activity intolerance [name] was given approximately 8 minutes of counseling today regarding her increased risk for exercise intolerance.  We discussed patient's specific personal and medical issues that raise our concern.  She was advised of strategies to prevent  injury and ways to improve her cardiopulmonary fitness levels slowly over time.    5. Obesity, current BMI 36.2  Course: [name] is currently in the action stage of change. As such, her goal is to continue with weight loss efforts.   Nutrition goals: She has agreed to the Category 1 Plan.   Exercise goals:  As tolerated.  Behavioral modification strategies: increasing lean protein intake, decreasing simple carbohydrates, increasing vegetables, and increasing water intake.  Kaitlyn Morgan has agreed to follow-up with our clinic in 3-4 weeks. She was informed of the importance of frequent follow-up visits to maximize her success with intensive lifestyle modifications for her multiple health conditions.   Objective:   Blood pressure 124/85, pulse 78, temperature (!) 97.4 F (36.3 C), temperature source Oral, height 5\' 2"  (1.575 m), weight 198 lb (89.8 kg), SpO2 99 %. Body mass index is 36.21 kg/m.  General: Cooperative, alert, well developed, in no acute distress. HEENT: Conjunctivae and lids unremarkable. Cardiovascular: Regular rhythm.  Lungs: Normal work of breathing. Neurologic: No focal deficits.   Lab Results  Component Value Date   CREATININE 0.80 12/25/2020   BUN 13 12/25/2020   NA 143 12/25/2020   K 4.1 12/25/2020   CL 104 12/25/2020   CO2 32 12/25/2020   Lab Results  Component Value Date   ALT 12 12/25/2020   AST 18 12/25/2020   ALKPHOS 74 12/25/2020   BILITOT 0.6 12/25/2020   Lab Results  Component Value Date   HGBA1C 4.9 05/18/2020   HGBA1C 5.1 10/29/2019   HGBA1C 5.0 03/14/2016   Lab Results  Component Value  Date   INSULIN 7.5 05/18/2020   INSULIN 14.7 10/29/2019   Lab Results  Component Value Date   TSH 3.860 10/29/2019   Lab Results  Component Value Date   CHOL 187 05/18/2020   HDL 59 05/18/2020   LDLCALC 117 (H) 05/18/2020   TRIG 60 05/18/2020   CHOLHDL 3.6 10/29/2019   Lab Results  Component Value Date   WBC 4.1 12/25/2020   HGB 13.5  12/25/2020   HCT 39.6 12/25/2020   MCV 89.9 12/25/2020   PLT 261.0 12/25/2020   Lab Results  Component Value Date   IRON 59 05/18/2020   TIBC 362 05/18/2020   FERRITIN 70 05/18/2020   Attestation Statements:   Reviewed by clinician on day of visit: allergies, medications, problem list, medical history, surgical history, family history, social history, and previous encounter notes.  I, Water quality scientist, CMA, am acting as transcriptionist for Briscoe Deutscher, DO  I have reviewed the above documentation for accuracy and completeness, and I agree with the above. Briscoe Deutscher, DO

## 2021-02-01 NOTE — Progress Notes (Signed)
[place]   [phone] [phone]   Clinic Follow up Note   Patient Care Team: Carollee Herter, Alferd Apa, DO as PCP - General (Family Medicine) Truitt Merle, MD as Consulting Physician (Hematology) Kyung Rudd, MD as Consulting Physician (Radiation Oncology) Tania Ade, RN as Registered Nurse Leighton Ruff, MD as Consulting Physician (General Surgery) Richmond Campbell, MD as Consulting Physician (Gastroenterology) Maisie Fus, MD as Consulting Physician (Obstetrics and Gynecology)  Date of Service:  02/05/2021  CHIEF COMPLAINT: F/u rectal cancer  SUMMARY OF ONCOLOGIC HISTORY: Oncology History Overview Note  Rectal cancer [facility])   Staging form: Colon and Rectum, AJCC 7th Edition     Clinical stage from 12/03/2015: Stage IIIB (T3, N1, M0) - Signed by Truitt Merle, MD on 12/18/2015      Rectal cancer ([place])  12/03/2015 Initial Diagnosis   Rectal cancer ([place])    12/03/2015 Procedure   Colonoscopy showed a partially obstructing tumor in the proximal rectum, its diameter measured 6 mm, biopsed    12/03/2015 Initial Biopsy   Rectum mass biopsy showed adenocarcinoma, moderately differentiated    12/10/2015 Imaging   CT abdomen and pelvis with contrast showed luminal narrowing and mural thickening in the proximal to mid rectum. There is a 6 mm short axis mesorectal lymph node suspicious for metastasis. There are several tiny indeterminate liver lesions     12/17/2015 Imaging   MRI pelvis with and without contrast showed a advanced T3 rectal adenocarcinoma (7cm), N1 disease (at least 2 left-sided nasal rectal lymph nodes are seen measuring 6-7 mm), distance from tumor to the sphincter is 8 cm    12/17/2015 Imaging   CT chest with contrast showed no evidence of metastasis or other changes    12/30/2015 - 02/08/2016 Radiation Therapy   neoadjuvant radiation to rectal cancer     12/30/2015 - 02/08/2016 Chemotherapy   xeloda 1500mg  twice daily with  concurent radiation, she tolerated well     03/15/2016 Imaging   CT AP IMPRESSION: 1. No acute findings within the abdomen or pelvis. No evidence for metastatic disease. 2. Mild wall thickening involving the distal sigmoid colon and rectum is identified. Nonspecific and may reflect sequelae of external beam radiation.   03/21/2016 Surgery   Rectosigmoid segmental resection     03/21/2016 Pathology Results   Invasive well differentiated adenocarcinoma, 5cm,  margins are negative, LVI (-), 1 out of 27 nodes were negative.      05/05/2016 - 05/05/2016 Adjuvant Chemotherapy   Adjuvant CAPOX, she had facial numbness and SOB after she completed the first oxaliplatin infusion, and refused more oxaliplatin     12/12/2016 Imaging   CT AP IMPRESSION: 1. Status post resection of rectal carcinoma. No complications identified and no specific features identified to suggest residual/recurrence of tumor along the suture line. 2. Stable tiny low-attenuation foci within the liver. This is too small to characterize. 3. No specific findings identified to suggest metastatic disease.   01/04/2018 Imaging   01/04/2018 CT Chest WO Contrast IMPRESSION: 1. Stable chest CT.  No findings of metastatic disease identified.  01/04/2018 MRI AP WO Contrast IMPRESSION: 1. Surgical changes within the high rectum. No evidence of metastatic disease. Mildly asymmetric soft tissue thickening in this area is likely treatment related. No evidence of locally recurrent disease on the colonoscopy of 2018. 2.  Aortic Atherosclerosis (ICD10-I70.0). 3. Mildly heterogeneous enhancement involving the sacrum is most likely related to prior radiation therapy   04/26/2019 Imaging   CT CAP  W Contrast  IMPRESSION: 1. Postoperative findings in the rectum without evidence of active malignancy. 2. 2 hypodense lesions in the spleen are stable and likely benign. 3.  Aortic Atherosclerosis (ICD10-I70.0). 4. Nonobstructive left  nephrolithiasis.     07/15/2020 Imaging   CT AP  IMPRESSION: 1. No acute intra-abdominal or pelvic pathology. No bowel obstruction. Normal appendix. 2. A 4 mm nonobstructing left renal inferior pole stone. No hydronephrosis.      CURRENT THERAPY:  Surveillance  INTERVAL HISTORY:  Kaitlyn Morgan is here for a follow up of rectal cancer. She was last seen by me 1 year ago. She presents to the clinic alone. She notes no new major changes in the past year. She notes no new concerns and she is overall stable. She notes her BM is regular and no incontinence. She notes she is walking more lately and altered her diet. She did have a Kidney stone in late 07/2020, which was likely passed. Given her right leg pain, she has not had sex. She does notes pain from Pap Smear and she will speak with Gyn about that.     REVIEW OF SYSTEMS:   Constitutional: Denies fevers, chills or abnormal weight loss Eyes: Denies blurriness of vision Ears, nose, mouth, throat, and face: Denies mucositis or sore throat Respiratory: Denies cough, dyspnea or wheezes Cardiovascular: Denies palpitation, chest discomfort or lower extremity swelling Gastrointestinal:  Denies nausea, heartburn or change in bowel habits Skin: Denies abnormal skin rashes Lymphatics: Denies new lymphadenopathy or easy bruising Neurological:Denies numbness, tingling or new weaknesses Behavioral/Psych: Mood is stable, no new changes  All other systems were reviewed with the patient and are negative.  MEDICAL HISTORY:  Past Medical History:  Diagnosis Date   Anemia    during pregnancy, not now.   Arthritis    osteoarthritis-neck"some issues with cervical vertebrae"   Asthma    Atypical syncope    1 episode -never had follow up with cardiology-never any further episodes, did go to urgent care.   Back pain    Colon cancer ([place])    rectal cancer Radiation and oral chemo with radiation.   Complication of anesthesia    Constipation     Edema, lower extremity    Family history of breast cancer    Family history of uterine cancer    Genital lesion, female    at times   Heart burn    occ.   Hx of seasonal allergies    Joint pain    Motion sickness    PONV (postoperative nausea and vomiting)    Rectal cancer ([facility])    Shortness of breath    Swallowing difficulty    Umbilicus discharge     SURGICAL HISTORY: Past Surgical History:  Procedure Laterality Date   ABDOMINAL HYSTERECTOMY     atypical cell, hx abnormal pap test   CESAREAN SECTION     COLON SURGERY     DIAGNOSTIC LAPAROSCOPY     with D and C and Ovarian cystectomy, tubal ligation done at this time '00   [place]     XI ROBOTIC ASSISTED LOWER ANTERIOR RESECTION N/A 03/21/2016   Procedure: XI ROBOTIC ASSISTED LOWER ANTERIOR RESECTION;  Surgeon: Leighton Ruff, MD;  Location: [facility];  Service: General;  Laterality: N/A;    I have reviewed the social history and family history with the patient and they are unchanged from previous note.  ALLERGIES:  is allergic to codeine,  morphine and related, sucralose, and zolpidem tartrate.  MEDICATIONS:  Current Outpatient Medications  Medication Sig Dispense Refill   hydrochlorothiazide (HYDRODIURIL) 25 MG tablet TAKE 1 TABLET(25 MG) BY MOUTH DAILY 90 tablet 3   VIVELLE-DOT 0.1 MG/24HR patch 1 patch 2 (two) times a week.     No current facility-administered medications for this visit.    PHYSICAL EXAMINATION: ECOG PERFORMANCE STATUS: 0 - Asymptomatic  Vitals:   02/05/21 1210  BP: 102/86  Pulse: 73  Resp: 18  Temp: 98 F (36.7 C)  SpO2: 100%   Filed Weights   02/05/21 1210  Weight: 198 lb 8 oz (90 kg)    GENERAL:alert, no distress and comfortable SKIN: skin color, texture, turgor are normal, no rashes or significant lesions EYES: normal, Conjunctiva are pink and non-injected, sclera clear  NECK: supple, thyroid normal size, non-tender, without  nodularity LYMPH:  no palpable lymphadenopathy in the cervical, axillary  LUNGS: clear to auscultation and percussion with normal breathing effort HEART: regular rate & rhythm and no murmurs and no lower extremity edema ABDOMEN:abdomen soft, non-tender and normal bowel sounds Musculoskeletal:no cyanosis of digits and no clubbing  NEURO: alert & oriented x 3 with fluent speech, no focal motor/sensory deficits She deferred rectal exam today   LABORATORY DATA:  I have reviewed the data as listed CBC Latest Ref Rng & Units 02/05/2021 12/25/2020 11/20/2020  WBC 4.0 - 10.5 K/uL 4.1 4.1 4.8  Hemoglobin 12.0 - 15.0 g/dL 13.4 13.5 14.0  Hematocrit 36.0 - 46.0 % 38.5 39.6 40.2  Platelets 150 - 400 K/uL 267 261.0 281     CMP Latest Ref Rng & Units 02/05/2021 12/25/2020 11/20/2020  Glucose 70 - 99 mg/dL 85 86 78  BUN 6 - 20 mg/dL 11 13 12   Creatinine 0.44 - 1.00 mg/dL 0.80 0.80 0.82  Sodium 135 - 145 mmol/L 143 143 145  Potassium 3.5 - 5.1 mmol/L 3.7 4.1 4.1  Chloride 98 - 111 mmol/L 103 104 104  CO2 22 - 32 mmol/L 29 32 25  Calcium 8.9 - 10.3 mg/dL 9.1 9.5 10.0  Total Protein 6.5 - 8.1 g/dL 7.2 7.1 7.7  Total Bilirubin 0.3 - 1.2 mg/dL 1.1 0.6 0.8  Alkaline Phos 38 - 126 U/L 79 74 -  AST 15 - 41 U/L 18 18 18   ALT 0 - 44 U/L 9 12 11       RADIOGRAPHIC STUDIES: I have personally reviewed the radiological images as listed and agreed with the findings in the report. No results found.   ASSESSMENT & PLAN:  Kemi Gell is a 55 y.o. female with    1. Rectal adenocarcinoma, proximal rectum, cT3N1M0, ypT3N1aM0, stage IIIB, moderately differentiated -She was diagnosed on 12/03/2015. She received neoadjuvant chemo and radiation, finished 01/2016. -Due to the high risk of cancer recurrence after surgery, I previously recommend adjuvant chemo but she declined.  -Her last colonoscopy was in 07/2018 with benign polyps removed. -She is clinically doing well. Labs reviewed, CBC and CMP WNL. Physical  exam unremarkable today.There is no clinical concern for recurrence.  -She had CT AP for kidney stone on 07/15/20 which was NED. She is 5 years since her cancer diagnosis. Her risk of recurrence is minimal now. She is fine to continue surveillance with her PCP. I will release her from my care. She is agreeable.  -F/u with me as needed in the future.     2. Cancer Screenings, Genetic testing was negative for pathogenetic mutations -I encouraged her to stay up  to date with age appropriate cancer screenings such as yearly mammograms with her Gyn and Pap smear every 2-3 years with her Gyn. -Her last colonoscopy was in 07/2018.    [place] Discharge  -She has been treated with oral antibiotics but infection returned. Topical ointment did not work. She has been seen by Dermatologist who referred her back to GI as they suspect this is related to GI and not skin alone. -She plans to have surgery for this in 2022.    4. Weight Management, right leg pain -[facility] and found [facility] helpful.  -S/p PT her right leg pain has improved. She also follows up with [facility].      Plan  -She has completed 5 years of surveillance.  F/u as needed in the future.    No problem-specific Assessment & Plan notes found for this encounter.   No orders of the defined types were placed in this encounter.  All questions were answered. The patient knows to call the clinic with any problems, questions or concerns. No barriers to learning was detected. The total time spent in the appointment was 25 minutes.     Truitt Merle, MD 02/05/2021   I, Joslyn Devon, am acting as scribe for Truitt Merle, MD.   I have reviewed the above documentation for accuracy and completeness, and I agree with the above.

## 2021-02-05 ENCOUNTER — Encounter: Payer: Self-pay | Admitting: Hematology

## 2021-02-05 ENCOUNTER — Inpatient Hospital Stay (HOSPITAL_BASED_OUTPATIENT_CLINIC_OR_DEPARTMENT_OTHER): Payer: 59 | Admitting: Hematology

## 2021-02-05 ENCOUNTER — Inpatient Hospital Stay: Payer: 59 | Attending: Hematology

## 2021-02-05 ENCOUNTER — Other Ambulatory Visit: Payer: Self-pay

## 2021-02-05 VITALS — BP 102/86 | HR 73 | Temp 98.0°F | Resp 18 | Ht 62.0 in | Wt 198.5 lb

## 2021-02-05 DIAGNOSIS — C20 Malignant neoplasm of rectum: Secondary | ICD-10-CM

## 2021-02-05 DIAGNOSIS — Z85048 Personal history of other malignant neoplasm of rectum, rectosigmoid junction, and anus: Secondary | ICD-10-CM | POA: Insufficient documentation

## 2021-02-05 DIAGNOSIS — Z79899 Other long term (current) drug therapy: Secondary | ICD-10-CM | POA: Diagnosis not present

## 2021-02-05 DIAGNOSIS — Z923 Personal history of irradiation: Secondary | ICD-10-CM | POA: Diagnosis not present

## 2021-02-05 DIAGNOSIS — J45909 Unspecified asthma, uncomplicated: Secondary | ICD-10-CM | POA: Diagnosis not present

## 2021-02-05 LAB — CBC WITH DIFFERENTIAL (CANCER CENTER ONLY)
Abs Immature Granulocytes: 0.01 10*3/uL (ref 0.00–0.07)
Basophils Absolute: 0 10*3/uL (ref 0.0–0.1)
Basophils Relative: 1 %
Eosinophils Absolute: 0.1 10*3/uL (ref 0.0–0.5)
Eosinophils Relative: 2 %
HCT: 38.5 % (ref 36.0–46.0)
Hemoglobin: 13.4 g/dL (ref 12.0–15.0)
Immature Granulocytes: 0 %
Lymphocytes Relative: 28 %
Lymphs Abs: 1.2 10*3/uL (ref 0.7–4.0)
MCH: 30.5 pg (ref 26.0–34.0)
MCHC: 34.8 g/dL (ref 30.0–36.0)
MCV: 87.7 fL (ref 80.0–100.0)
Monocytes Absolute: 0.3 10*3/uL (ref 0.1–1.0)
Monocytes Relative: 8 %
Neutro Abs: 2.5 10*3/uL (ref 1.7–7.7)
Neutrophils Relative %: 61 %
Platelet Count: 267 10*3/uL (ref 150–400)
RBC: 4.39 MIL/uL (ref 3.87–5.11)
RDW: 13.1 % (ref 11.5–15.5)
WBC Count: 4.1 10*3/uL (ref 4.0–10.5)
nRBC: 0 % (ref 0.0–0.2)

## 2021-02-05 LAB — CMP (CANCER CENTER ONLY)
ALT: 9 U/L (ref 0–44)
AST: 18 U/L (ref 15–41)
Albumin: 3.8 g/dL (ref 3.5–5.0)
Alkaline Phosphatase: 79 U/L (ref 38–126)
Anion gap: 11 (ref 5–15)
BUN: 11 mg/dL (ref 6–20)
CO2: 29 mmol/L (ref 22–32)
Calcium: 9.1 mg/dL (ref 8.9–10.3)
Chloride: 103 mmol/L (ref 98–111)
Creatinine: 0.8 mg/dL (ref 0.44–1.00)
GFR, Estimated: >60 mL/min (ref >60)
Glucose, Bld: 85 mg/dL (ref 70–99)
Potassium: 3.7 mmol/L (ref 3.5–5.1)
Sodium: 143 mmol/L (ref 135–145)
Total Bilirubin: 1.1 mg/dL (ref 0.3–1.2)
Total Protein: 7.2 g/dL (ref 6.5–8.1)

## 2021-02-05 MED ORDER — ACETAMINOPHEN 500 MG PO TABS
ORAL_TABLET | ORAL | Status: AC
  Filled 2021-02-05: qty 2

## 2021-02-18 ENCOUNTER — Ambulatory Visit (INDEPENDENT_AMBULATORY_CARE_PROVIDER_SITE_OTHER): Payer: 59 | Admitting: Family Medicine

## 2021-03-02 ENCOUNTER — Other Ambulatory Visit: Payer: Self-pay

## 2021-03-02 ENCOUNTER — Encounter (INDEPENDENT_AMBULATORY_CARE_PROVIDER_SITE_OTHER): Payer: Self-pay | Admitting: Family Medicine

## 2021-03-02 ENCOUNTER — Ambulatory Visit (INDEPENDENT_AMBULATORY_CARE_PROVIDER_SITE_OTHER): Payer: 59 | Admitting: Family Medicine

## 2021-03-02 VITALS — BP 110/75 | HR 74 | Temp 98.0°F | Ht 62.0 in | Wt 188.0 lb

## 2021-03-02 DIAGNOSIS — Z6838 Body mass index (BMI) 38.0-38.9, adult: Secondary | ICD-10-CM

## 2021-03-02 DIAGNOSIS — R632 Polyphagia: Secondary | ICD-10-CM

## 2021-03-02 DIAGNOSIS — Z9189 Other specified personal risk factors, not elsewhere classified: Secondary | ICD-10-CM | POA: Diagnosis not present

## 2021-03-02 MED ORDER — PHENTERMINE HCL 37.5 MG PO TABS
ORAL_TABLET | ORAL | 0 refills | Status: DC
Start: 2021-03-02 — End: 2021-09-03

## 2021-03-05 DIAGNOSIS — K429 Umbilical hernia without obstruction or gangrene: Secondary | ICD-10-CM | POA: Insufficient documentation

## 2021-03-12 NOTE — Progress Notes (Signed)
Chief Complaint:   OBESITY Kaitlyn Morgan is here to discuss her progress with her obesity treatment plan along with follow-up of her obesity related diagnoses.   Today's visit was #: 14 Starting weight: 210 lbs Starting date: 10/29/2019 Today's weight: 188 lbs Today's date: 03/02/2021 Weight change since last visit: 10 lbs Total lbs lost to date: 22 lbs Body mass index is 34.39 kg/m.  Total weight loss percentage to date: -10.48%  Interim History:  Kaitlyn Morgan started a step challenge (new Apple watch).  Will see the surgeon on Friday regarding abdominal pinching/umbilical DC.  Her duck died - 55 year old "sunshine in her life".  Current Meal Plan: the Category 1 Plan for 54-60% of the time.  Current Exercise Plan: Increased walking. Current Anti-Obesity Medications: phentermine 18.75 mg twice daily. Side effects: None.  Assessment/Plan:   Meds ordered this encounter  Medications   phentermine (ADIPEX-P) 37.5 MG tablet    Sig: 1/2 tablet before breakfast, 1/2 tablet at noon    Dispense:  90 tablet    Refill:  0    1. Polyphagia Controlled. Current treatment: phentermine 18.75 mg twice daily. Polyphagia refers to excessive feelings of hunger. She will continue to focus on protein-rich, low simple carbohydrate foods. We reviewed the importance of hydration, regular exercise for stress reduction, and restorative sleep.  - Refill phentermine (ADIPEX-P) 37.5 MG tablet; 1/2 tablet before breakfast, 1/2 tablet at noon  Dispense: 90 tablet; Refill: 0  Having again reminded the patient of the "off label" use of Phentermine beyond three consecutive months, and again discussing the risks, benefits, contraindications, and limitations of it's use; given it's role in the successful treatment of obesity thus far and lack of adverse effect, patient has expressed desire and given informed verbal consent to continue use.   I have consulted the [place] Controlled Substances Registry for this patient, and  feel the risk/benefit ratio today is favorable for proceeding with this prescription for a controlled substance. The patient understands monitoring parameters and red flags.   2. At risk for heart disease Due to Kaitlyn Morgan's current state of health and medical condition(s), she is at a higher risk for heart disease.  This puts the patient at much greater risk to subsequently develop cardiopulmonary conditions that can significantly affect patient's quality of life in a negative manner.    At least 8 minutes were spent on counseling Kaitlyn Morgan about these concerns today. Evidence-based interventions for health behavior change were utilized today including the discussion of self monitoring techniques, problem-solving barriers, and SMART goal setting techniques.  Specifically, regarding patient's less desirable eating habits and patterns, we employed the technique of small changes when Kaitlyn Morgan has not been able to fully commit to her prudent nutritional plan.  3. Obesity with current BMI of 34.4  Course: Kaitlyn Morgan is currently in the action stage of change. As such, her goal is to continue with weight loss efforts.   Nutrition goals: She has agreed to the Category 1 Plan.   Exercise goals:  As is.  Behavioral modification strategies: increasing lean protein intake, decreasing simple carbohydrates, increasing vegetables, and increasing water intake.  Kaitlyn Morgan has agreed to follow-up with our clinic in 4 weeks. She was informed of the importance of frequent follow-up visits to maximize her success with intensive lifestyle modifications for her multiple health conditions.   Objective:   Blood pressure 110/75, pulse 74, temperature 98 F (36.7 C), temperature source Oral, height '5\' 2"'$  (1.575 m), weight 188 lb (85.3 kg), SpO2 95 %.  Body mass index is 34.39 kg/m.  General: Cooperative, alert, well developed, in no acute distress. HEENT: Conjunctivae and lids unremarkable. Cardiovascular: Regular rhythm.   Lungs: Normal work of breathing. Neurologic: No focal deficits.   Lab Results  Component Value Date   CREATININE 0.80 02/05/2021   BUN 11 02/05/2021   NA 143 02/05/2021   K 3.7 02/05/2021   CL 103 02/05/2021   CO2 29 02/05/2021   Lab Results  Component Value Date   ALT 9 02/05/2021   AST 18 02/05/2021   ALKPHOS 79 02/05/2021   BILITOT 1.1 02/05/2021   Lab Results  Component Value Date   HGBA1C 4.9 05/18/2020   HGBA1C 5.1 10/29/2019   HGBA1C 5.0 03/14/2016   Lab Results  Component Value Date   INSULIN 7.5 05/18/2020   INSULIN 14.7 10/29/2019   Lab Results  Component Value Date   TSH 3.860 10/29/2019   Lab Results  Component Value Date   CHOL 187 05/18/2020   HDL 59 05/18/2020   LDLCALC 117 (H) 05/18/2020   TRIG 60 05/18/2020   CHOLHDL 3.6 10/29/2019   Lab Results  Component Value Date   VD25OH 35.9 10/29/2019   VD25OH 26.32 (L) 05/21/2019   Lab Results  Component Value Date   WBC 4.1 02/05/2021   HGB 13.4 02/05/2021   HCT 38.5 02/05/2021   MCV 87.7 02/05/2021   PLT 267 02/05/2021   Lab Results  Component Value Date   IRON 59 05/18/2020   TIBC 362 05/18/2020   FERRITIN 70 05/18/2020   Attestation Statements:   Reviewed by clinician on day of visit: allergies, medications, problem list, medical history, surgical history, family history, social history, and previous encounter notes.  I, Water quality scientist, CMA, am acting as transcriptionist for Briscoe Deutscher, DO  I have reviewed the above documentation for accuracy and completeness, and I agree with the above. Briscoe Deutscher, DO

## 2021-04-06 ENCOUNTER — Ambulatory Visit (INDEPENDENT_AMBULATORY_CARE_PROVIDER_SITE_OTHER): Payer: 59 | Admitting: Family Medicine

## 2021-05-10 ENCOUNTER — Encounter (INDEPENDENT_AMBULATORY_CARE_PROVIDER_SITE_OTHER): Payer: Self-pay | Admitting: Family Medicine

## 2021-05-10 ENCOUNTER — Ambulatory Visit (INDEPENDENT_AMBULATORY_CARE_PROVIDER_SITE_OTHER): Payer: 59 | Admitting: Family Medicine

## 2021-05-10 ENCOUNTER — Other Ambulatory Visit: Payer: Self-pay

## 2021-05-10 VITALS — BP 104/70 | HR 78 | Temp 97.7°F | Ht 62.0 in | Wt 186.0 lb

## 2021-05-10 DIAGNOSIS — K5909 Other constipation: Secondary | ICD-10-CM

## 2021-05-10 DIAGNOSIS — F4323 Adjustment disorder with mixed anxiety and depressed mood: Secondary | ICD-10-CM | POA: Diagnosis not present

## 2021-05-10 DIAGNOSIS — K429 Umbilical hernia without obstruction or gangrene: Secondary | ICD-10-CM

## 2021-05-10 DIAGNOSIS — Z6838 Body mass index (BMI) 38.0-38.9, adult: Secondary | ICD-10-CM
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
## 2021-06-18 ENCOUNTER — Other Ambulatory Visit: Payer: Self-pay

## 2021-06-18 ENCOUNTER — Encounter: Payer: Self-pay | Admitting: Family Medicine

## 2021-06-18 ENCOUNTER — Ambulatory Visit (INDEPENDENT_AMBULATORY_CARE_PROVIDER_SITE_OTHER): Payer: 59 | Admitting: Family Medicine

## 2021-06-18 VITALS — BP 123/73 | HR 73 | Temp 97.0°F | Ht 62.0 in | Wt 197.8 lb

## 2021-06-18 DIAGNOSIS — S90229A Contusion of unspecified lesser toe(s) with damage to nail, initial encounter: Secondary | ICD-10-CM

## 2021-06-18 DIAGNOSIS — B351 Tinea unguium: Secondary | ICD-10-CM

## 2021-06-18 NOTE — Progress Notes (Signed)
Chief Complaint  Patient presents with   Nail Problem    Pt c/o black discoloration underneath right long-toe nail     Kaitlyn Morgan is a 55 y.o. female here for a skin complaint.  Duration: 4 weeks Location: 2nd toenail on R Pruritic? No Painful? No Drainage? No Other associated symptoms: RLQ abd pain Great aunt had a AAA.  Therapies tried thus far: none  Past Medical History:  Diagnosis Date   Anemia    during pregnancy, not now.   Arthritis    osteoarthritis-neck"some issues with cervical vertebrae"   Asthma    Atypical syncope    1 episode -never had follow up with cardiology-never any further episodes, did go to urgent care.   Back pain    Colon cancer ([place])    rectal cancer Radiation and oral chemo with radiation.   Complication of anesthesia    Constipation    Edema, lower extremity    Family history of breast cancer    Family history of uterine cancer    Genital lesion, female    at times   Heart burn    occ.   Hx of seasonal allergies    Joint pain    Motion sickness    PONV (postoperative nausea and vomiting)    Rectal cancer ([facility])    Shortness of breath    Swallowing difficulty    Umbilicus discharge     BP 123/73   Pulse 73   Temp (!) 97 F (36.1 C) (Temporal)   Ht 5\' 2"  (1.575 m)   Wt 197 lb 12.8 oz (89.7 kg)   SpO2 100%   BMI 36.18 kg/m  Gen: awake, alert, appearing stated age Heart: Brisk cap refill Abd: BS+, S, ND, no pulsatile masses, +RLQ abd pain to moderate palpation Lungs: No accessory muscle use Skin: 2nd digit on R foot has thickened nail with hematoma in center with new nail growing in behind. No drainage, erythema, TTP, fluctuance, excoriation of nail and surrounding soft tissue Psych: Age appropriate judgment and insight  Subungual hematoma of second toe  Toenail fungus  Reassurance. Vick's or Penlac rec'd for fungus. Make sure to have good fitted shoes for exercise. Likely 2/2 trauma from going down hills at [facility]. Bowel issues/constipation 2/2 work done from rectal cancer dx. Rec'd fiber supp. Doubt acute abd issue.  F/u prn. The patient voiced understanding and agreement to the plan.  [place], DO 06/18/21 3:43 PM

## 2021-06-18 NOTE — Patient Instructions (Signed)
Consider Vick's Vaporub or Penlac for your nail fungus.   This should continue to grow out.  Consider taking Metamucil or Benefiber daily for bowels.  Stay hydrated.  Let us know if you need anything.

## 2021-06-21 ENCOUNTER — Ambulatory Visit (INDEPENDENT_AMBULATORY_CARE_PROVIDER_SITE_OTHER): Payer: 59 | Admitting: Family Medicine

## 2021-07-22 ENCOUNTER — Other Ambulatory Visit: Payer: Self-pay

## 2021-07-22 ENCOUNTER — Telehealth (INDEPENDENT_AMBULATORY_CARE_PROVIDER_SITE_OTHER): Payer: 59 | Admitting: Family Medicine

## 2021-07-22 ENCOUNTER — Encounter (INDEPENDENT_AMBULATORY_CARE_PROVIDER_SITE_OTHER): Payer: Self-pay | Admitting: Family Medicine

## 2021-07-22 ENCOUNTER — Telehealth: Payer: Self-pay | Admitting: *Deleted

## 2021-07-22 DIAGNOSIS — R635 Abnormal weight gain: Secondary | ICD-10-CM

## 2021-07-22 DIAGNOSIS — T7431XA Adult psychological abuse, confirmed, initial encounter: Secondary | ICD-10-CM

## 2021-07-22 DIAGNOSIS — K0889 Other specified disorders of teeth and supporting structures: Secondary | ICD-10-CM

## 2021-07-22 DIAGNOSIS — Z6838 Body mass index (BMI) 38.0-38.9, adult: Secondary | ICD-10-CM

## 2021-07-22 DIAGNOSIS — R632 Polyphagia: Secondary | ICD-10-CM

## 2021-07-22 DIAGNOSIS — Z789 Other specified health status: Secondary | ICD-10-CM

## 2021-07-22 DIAGNOSIS — M25559 Pain in unspecified hip: Secondary | ICD-10-CM

## 2021-07-22 MED ORDER — MELOXICAM 15 MG PO TABS: 15.0000 mg | ORAL_TABLET | Freq: Every day | ORAL | 2 refills | Status: DC

## 2021-07-22 NOTE — Chronic Care Management (AMB) (Signed)
  Care Management   Note  07/22/2021 Name: [name] MRN: [id number] DOB: Jun 30, 1966  Kaitlyn Morgan is a 55 y.o. year old female who is a primary care patient of [name], DO. I reached out to [employer] by phone today in response to a referral sent by Ms. Joua Franze's primary care provider.   Ms. Copelin was given information about care management services today including:  Care management services include personalized support from designated clinical staff supervised by her physician, including individualized plan of care and coordination with other care providers 24/7 contact phone numbers for assistance for urgent and routine care needs. The patient may stop care management services at any time by phone call to the office staff.  Patient did not agree to enrollment in care management services and does not wish to consider at this time.  Follow up plan: Patient declines further follow up and engagement by the care management team. Appropriate care team members and provider have been notified via electronic communication.   Julian Hy, [facility]  Direct Dial: [phone]
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
## 2021-08-02 ENCOUNTER — Encounter (INDEPENDENT_AMBULATORY_CARE_PROVIDER_SITE_OTHER): Payer: Self-pay

## 2021-09-03 ENCOUNTER — Encounter: Payer: Self-pay | Admitting: Family Medicine

## 2021-09-03 ENCOUNTER — Other Ambulatory Visit: Payer: Self-pay

## 2021-09-03 ENCOUNTER — Ambulatory Visit (INDEPENDENT_AMBULATORY_CARE_PROVIDER_SITE_OTHER): Payer: 59 | Admitting: Family Medicine

## 2021-09-03 ENCOUNTER — Ambulatory Visit (HOSPITAL_BASED_OUTPATIENT_CLINIC_OR_DEPARTMENT_OTHER)
Admission: RE | Admit: 2021-09-03 | Discharge: 2021-09-03 | Disposition: A | Discharge status: Home / Self Care | Payer: 59 | Source: Ambulatory Visit | Attending: Family Medicine | Admitting: Family Medicine

## 2021-09-03 VITALS — BP 119/68 | HR 68 | Temp 97.7°F | Resp 12 | Ht 62.0 in | Wt 204.0 lb

## 2021-09-03 DIAGNOSIS — R079 Chest pain, unspecified: Secondary | ICD-10-CM

## 2021-09-03 LAB — D-DIMER, QUANTITATIVE: D-Dimer, Quant: 0.23 mcg/mL FEU (ref <0.50)

## 2021-09-03 LAB — CBC WITH DIFFERENTIAL/PLATELET
Basophils Absolute: 0.1 10*3/uL (ref 0.0–0.1)
Basophils Relative: 1.2 % (ref 0.0–3.0)
Eosinophils Absolute: 0.1 10*3/uL (ref 0.0–0.7)
Eosinophils Relative: 2.6 % (ref 0.0–5.0)
HCT: 41 % (ref 36.0–46.0)
Hemoglobin: 13.6 g/dL (ref 12.0–15.0)
Lymphocytes Relative: 26.2 % (ref 12.0–46.0)
Lymphs Abs: 1.2 10*3/uL (ref 0.7–4.0)
MCHC: 33.1 g/dL (ref 30.0–36.0)
MCV: 89.9 fl (ref 78.0–100.0)
Monocytes Absolute: 0.3 10*3/uL (ref 0.1–1.0)
Monocytes Relative: 7.2 % (ref 3.0–12.0)
Neutro Abs: 2.8 10*3/uL (ref 1.4–7.7)
Neutrophils Relative %: 62.8 % (ref 43.0–77.0)
Platelets: 292 10*3/uL (ref 150.0–400.0)
RBC: 4.56 Mil/uL (ref 3.87–5.11)
RDW: 13.8 % (ref 11.5–15.5)
WBC: 4.5 10*3/uL (ref 4.0–10.5)

## 2021-09-03 LAB — COMPREHENSIVE METABOLIC PANEL
ALT: 13 U/L (ref 0–35)
AST: 17 U/L (ref 0–37)
Albumin: 4.3 g/dL (ref 3.5–5.2)
Alkaline Phosphatase: 67 U/L (ref 39–117)
BUN: 13 mg/dL (ref 6–23)
CO2: 30 mEq/L (ref 19–32)
Calcium: 9.1 mg/dL (ref 8.4–10.5)
Chloride: 104 mEq/L (ref 96–112)
Creatinine, Ser: 0.83 mg/dL (ref 0.40–1.20)
GFR: 80.04 mL/min (ref >60.00)
Glucose, Bld: 86 mg/dL (ref 70–99)
Potassium: 4 mEq/L (ref 3.5–5.1)
Sodium: 140 mEq/L (ref 135–145)
Total Bilirubin: 0.9 mg/dL (ref 0.2–1.2)
Total Protein: 7.3 g/dL (ref 6.0–8.3)

## 2021-09-03 LAB — TROPONIN I (HIGH SENSITIVITY): High Sens Troponin I: 4 ng/L (ref 2–17)

## 2021-09-03 NOTE — Patient Instructions (Signed)
EKG without signs of ischemia Blood work today Chest xray Cardiology referral placed

## 2021-09-03 NOTE — Progress Notes (Signed)
Acute Office Visit  Subjective:    Patient ID: Kaitlyn Morgan, female    DOB: 1966-07-13, 56 y.o.   MRN: [id number]  Chief Complaint  Patient presents with   Headache   BACK AND CHEST   LEFT SIDE JAW PAIN    HPI Patient is in today for chest pain.  Patient reports for the past 3 weeks or so she has been having intermittent upper back and chest pain.  However for the past week or so it has become more consistent.  She describes the back pain and chest pain as a dull pressure that is constant but can get worse at times.  She has not found any triggers for the worsening pain, sometimes happens at rest sometimes with activity.  She reports when chest pain becomes severe she has trouble catching her breath and will have intermittent nausea.  She is also been having intermittent pain down both arms, left worse than right with some intermittent numbness tingling and cramping of the left hand.  There have been a few instances when she has broke out in sweat after not doing anything to exert herself.  She is also having occasional dull headaches.  On Sunday she started having jaw pain that is a constant 6 out of 10 discomfort.  Reports that she had some dental work done about a week and a half ago so she is not sure if this is related or not.  Jaw pain is worse with opening her jaw all the way.  Pain is not reproduced with palpation.  Patient reports she is scared/anxious because she has never felt like this before and this feels like something is off.  She has been alternating Advil and Aleve for the discomfort without much relief.  Reports 1 day last week she woke up with severe chest pain and trouble breathing during the middle of the night.  She was able to fall back asleep and said that by the morning the pain was completely gone.  This occurred after having a physical therapy session earlier in the day (chronic leg pain) but she did not have any muscle soreness when she woke up which surprised  her.       Past Medical History:  Diagnosis Date   Anemia    during pregnancy, not now.   Arthritis    osteoarthritis-neck"some issues with cervical vertebrae"   Asthma    Atypical syncope    1 episode -never had follow up with cardiology-never any further episodes, did go to urgent care.   Back pain    Colon cancer ([place])    rectal cancer Radiation and oral chemo with radiation.   Complication of anesthesia    Constipation    Edema, lower extremity    Family history of breast cancer    Family history of uterine cancer    Genital lesion, female    at times   Heart burn    occ.   Hx of seasonal allergies    Joint pain    Motion sickness    PONV (postoperative nausea and vomiting)    Rectal cancer (HCC)    Shortness of breath    Swallowing difficulty    Umbilicus discharge     Past Surgical History:  Procedure Laterality Date   ABDOMINAL HYSTERECTOMY     atypical cell, hx abnormal pap test   CESAREAN SECTION     COLON SURGERY     DIAGNOSTIC LAPAROSCOPY     with D and  C and Ovarian cystectomy, tubal ligation done at this time '00   [place]     XI ROBOTIC ASSISTED LOWER ANTERIOR RESECTION N/A 03/21/2016   Procedure: XI ROBOTIC ASSISTED LOWER ANTERIOR RESECTION;  Surgeon: Leighton Ruff, MD;  Location: [facility];  Service: General;  Laterality: N/A;    Family History  Problem Relation Age of Onset   Breast cancer Maternal Grandmother        dx in her 64s   Cancer Paternal Grandmother 53       uterine cancer    Hypertension Father    COPD Father    Alcoholism Father    Hypertension Sister    Hypertension Paternal Grandfather    Heart attack Paternal Grandfather    Hypertension Sister    Supraventricular tachycardia Mother    Skin cancer Cousin        maternal first cousin    Social History   Socioeconomic History   Marital status: Legally Separated    Spouse name: Not on file   Number of children: 3   Years of  education: Not on file   Highest education level: Not on file  Occupational History   Occupation: Education officer, community  Tobacco Use   Smoking status: Never   Smokeless tobacco: Never  Substance and Sexual Activity   Alcohol use: No    Alcohol/week: 0.0 standard drinks   Drug use: No   Sexual activity: Not on file  Other Topics Concern   Not on file  Social History Narrative   Not on file   Social Determinants of Health   Financial Resource Strain: Not on file  Food Insecurity: Not on file  Transportation Needs: Not on file  Physical Activity: Not on file  Stress: Not on file  Social Connections: Not on file  Intimate Partner Violence: Not on file    Outpatient Medications Prior to Visit  Medication Sig Dispense Refill   hydrochlorothiazide (HYDRODIURIL) 25 MG tablet TAKE 1 TABLET(25 MG) BY MOUTH DAILY 90 tablet 3   VIVELLE-DOT 0.1 MG/24HR patch 1 patch 2 (two) times a week.     meloxicam (MOBIC) 15 MG tablet Take 1 tablet (15 mg total) by mouth daily. (Patient not taking: Reported on 09/03/2021) 30 tablet 2   OMEPRAZOLE PO Take by mouth. (Patient not taking: Reported on 09/03/2021)     phentermine (ADIPEX-P) 37.5 MG tablet 1/2 tablet before breakfast, 1/2 tablet at noon (Patient not taking: Reported on 07/22/2021) 90 tablet 0   No facility-administered medications prior to visit.    Allergies  Allergen Reactions   Codeine Nausea Only   Morphine And Related Itching   Sucralose Nausea And Vomiting    Fever   Zolpidem Tartrate     Hallucinations     Review of Systems All review of systems negative except what is listed in the HPI     Objective:    Physical Exam Vitals reviewed.  Constitutional:      Appearance: She is well-developed. She is obese.  HENT:     Head: Normocephalic and atraumatic.  Cardiovascular:     Rate and Rhythm: Normal rate and regular rhythm.  Pulmonary:     Effort: Pulmonary effort is normal.     Breath sounds: Normal breath sounds.   Abdominal:     Palpations: Abdomen is soft.  Musculoskeletal:        General: Normal range of motion.     Cervical back:  Normal range of motion and neck supple.     Comments: No pain on palpation of chest, back, jaw, arm  Skin:    General: Skin is warm and dry.  Neurological:     Mental Status: She is alert and oriented to person, place, and time.  Psychiatric:        Mood and Affect: Mood normal.        Speech: Speech normal.        Behavior: Behavior normal.    BP 119/68 (BP Location: Right Arm, Cuff Size: Large)    Pulse 68    Temp 97.7 F (36.5 C) (Oral)    Resp 12    Ht _0  (1.575 m)    Wt 204 lb (92.5 kg)    SpO2 100%    BMI 37.31 kg/m  Wt Readings from Last 3 Encounters:  09/03/21 204 lb (92.5 kg)  06/18/21 197 lb 12.8 oz (89.7 kg)  05/10/21 186 lb (84.4 kg)    Health Maintenance Due  Topic Date Due   COVID-19 Vaccine (1) Never done   HIV Screening  Never done   Hepatitis C Screening  Never done   TETANUS/TDAP  Never done   Zoster Vaccines- Shingrix (1 of 2) Never done   PAP SMEAR-Modifier  Never done   MAMMOGRAM  Never done   INFLUENZA VACCINE  Never done    There are no preventive care reminders to display for this patient.   Lab Results  Component Value Date   TSH 3.860 10/29/2019   Lab Results  Component Value Date   WBC 4.1 02/05/2021   HGB 13.4 02/05/2021   HCT 38.5 02/05/2021   MCV 87.7 02/05/2021   PLT 267 02/05/2021   Lab Results  Component Value Date   NA 143 02/05/2021   K 3.7 02/05/2021   CHLORIDE 106 04/19/2017   CO2 29 02/05/2021   GLUCOSE 85 02/05/2021   BUN 11 02/05/2021   CREATININE 0.80 02/05/2021   BILITOT 1.1 02/05/2021   ALKPHOS 79 02/05/2021   AST 18 02/05/2021   ALT 9 02/05/2021   PROT 7.2 02/05/2021   ALBUMIN 3.8 02/05/2021   CALCIUM 9.1 02/05/2021   ANIONGAP 11 02/05/2021   EGFR 82 (L) 04/19/2017   GFR 84.06 12/25/2020   Lab Results  Component Value Date   CHOL 187 05/18/2020   Lab Results  Component  Value Date   HDL 59 05/18/2020   Lab Results  Component Value Date   LDLCALC 117 (H) 05/18/2020   Lab Results  Component Value Date   TRIG 60 05/18/2020   Lab Results  Component Value Date   CHOLHDL 3.6 10/29/2019   Lab Results  Component Value Date   HGBA1C 4.9 05/18/2020       Assessment & Plan:    1. Chest pain, unspecified type EKG: NSR, HR 76, low voltage in precordial leads, no signs of ischemia. STAT labs today: troponin, d-dimer, cbc, cmp CXR today Given classic cardiac symptoms, prefer to go ahead and with cardiology referral to consider additional workup Will update her as results come in  Patient was given strict ED precautions   - EKG 12-Lead - CBC with Differential/Platelet - Comprehensive metabolic panel - D-dimer, quantitative - Troponin I (High Sensitivity) - Ambulatory referral to Cardiology - DG Chest 2 View   Follow-up with PCP to reassess if cardiac workup negative and symptoms persist.    Terrilyn Saver, NP

## 2021-09-08 ENCOUNTER — Ambulatory Visit (INDEPENDENT_AMBULATORY_CARE_PROVIDER_SITE_OTHER): Payer: 59 | Admitting: Family Medicine

## 2021-10-01 DIAGNOSIS — T8859XA Other complications of anesthesia, initial encounter: Secondary | ICD-10-CM | POA: Insufficient documentation

## 2021-10-01 DIAGNOSIS — M1990 Unspecified osteoarthritis, unspecified site: Secondary | ICD-10-CM | POA: Insufficient documentation

## 2021-10-01 DIAGNOSIS — O99019 Anemia complicating pregnancy, unspecified trimester: Secondary | ICD-10-CM | POA: Insufficient documentation

## 2021-10-01 DIAGNOSIS — M549 Dorsalgia, unspecified: Secondary | ICD-10-CM | POA: Insufficient documentation

## 2021-10-01 DIAGNOSIS — R1310 Dysphagia, unspecified: Secondary | ICD-10-CM | POA: Insufficient documentation

## 2021-10-01 DIAGNOSIS — J45909 Unspecified asthma, uncomplicated: Secondary | ICD-10-CM | POA: Insufficient documentation

## 2021-10-01 DIAGNOSIS — T753XXA Motion sickness, initial encounter: Secondary | ICD-10-CM | POA: Insufficient documentation

## 2021-10-01 DIAGNOSIS — M2550 Pain in unspecified joint: Secondary | ICD-10-CM | POA: Insufficient documentation

## 2021-10-01 DIAGNOSIS — R0602 Shortness of breath: Secondary | ICD-10-CM | POA: Insufficient documentation

## 2021-10-01 DIAGNOSIS — R55 Syncope and collapse: Secondary | ICD-10-CM | POA: Insufficient documentation

## 2021-10-01 DIAGNOSIS — C189 Malignant neoplasm of colon, unspecified: Secondary | ICD-10-CM | POA: Insufficient documentation

## 2021-10-01 DIAGNOSIS — N949 Unspecified condition associated with female genital organs and menstrual cycle: Secondary | ICD-10-CM | POA: Insufficient documentation

## 2021-10-01 DIAGNOSIS — Z889 Allergy status to unspecified drugs, medicaments and biological substances status: Secondary | ICD-10-CM | POA: Insufficient documentation

## 2021-10-01 DIAGNOSIS — R12 Heartburn: Secondary | ICD-10-CM | POA: Insufficient documentation

## 2021-10-01 DIAGNOSIS — D649 Anemia, unspecified: Secondary | ICD-10-CM | POA: Insufficient documentation

## 2021-10-05 ENCOUNTER — Other Ambulatory Visit: Payer: Self-pay

## 2021-10-05 ENCOUNTER — Encounter: Payer: Self-pay | Admitting: Cardiology

## 2021-10-05 ENCOUNTER — Ambulatory Visit (INDEPENDENT_AMBULATORY_CARE_PROVIDER_SITE_OTHER): Payer: 59 | Admitting: Cardiology

## 2021-10-05 VITALS — BP 120/76 | HR 75 | Ht 62.0 in | Wt 201.1 lb

## 2021-10-05 DIAGNOSIS — R0789 Other chest pain: Secondary | ICD-10-CM | POA: Diagnosis not present

## 2021-10-05 DIAGNOSIS — I7300 Raynaud's syndrome without gangrene: Secondary | ICD-10-CM

## 2021-10-05 DIAGNOSIS — R079 Chest pain, unspecified: Secondary | ICD-10-CM

## 2021-10-05 DIAGNOSIS — C20 Malignant neoplasm of rectum: Secondary | ICD-10-CM | POA: Diagnosis not present

## 2021-10-05 MED ORDER — ASPIRIN EC 81 MG PO TBEC: 81.0000 mg | DELAYED_RELEASE_TABLET | Freq: Every day | ORAL | 3 refills | Status: DC

## 2021-10-05 MED ORDER — METOPROLOL TARTRATE 100 MG PO TABS: 100.0000 mg | ORAL_TABLET | Freq: Once | ORAL | 0 refills | Status: DC

## 2021-10-05 NOTE — Progress Notes (Signed)
Cardiology Consultation:    Date:  10/05/2021   ID:  Kaitlyn Morgan, DOB 1966/01/10, MRN [id number]  PCP:  [name], DO  Cardiologist:  Jenne Campus, MD   Referring MD: Terrilyn Saver, NP   No chief complaint on file. Had a lot of problems  History of Present Illness:    Kaitlyn Morgan is a 56 y.o. female who is being seen today for the evaluation of chest pain and shortness of breath at the request of Terrilyn Saver, NP.  Past medical history significant for rectal cancer requiring surgery as well as chemotherapy and radiation therapy.  Also dyslipidemia, presented to [place] with numerous complaints for several she described over chest pain she point to the middle of the chest that this is sharp stabbing-like lasting only for few seconds not related to exercise.  Also complained of having some back pain not related to exercise this pain can radiate to both arms.  Also described to have some numbness in both arms also numbness in the face sometimes.  She is short of breath but this sensation typically happen when she sits she feels like she is not  However she can walk climb stairs and exercise with some fatigue tiredness but no symptomatology.  She does not have any typical features of coronary artery disease meaning there is no exertional chest pain tightness squeezing pressure mid chest.  She seems to be very anxious and she talks a lot during the visit describing very costly symptomatology.  She does have a, history of colon cancer with resection since that time she had a swelling of right lower extremities multiple DVT study has been performed which were negative.  Recently she went to her primary care physician with above-mentioned symptomatology, troponin has been performed which was normal, D-dimer was done which was normal EKG showed low voltage no acute ST segment changes.  She never had any CAD work-up in the past.  She did have multiple CTs done of the abdomen which  showed some calcification of the aorta. She does not smoke, never did.  She does not exercise on the regular basis She does have family history of coronary artery disease but not premature.  Past Medical History:  Diagnosis Date   Anemia    during pregnancy, not now.   Arthritis    osteoarthritis-neck"some issues with cervical vertebrae"   Asthma    Atypical syncope    1 episode -never had follow up with cardiology-never any further episodes, did go to urgent care.   Back pain    Colon cancer ([place])    rectal cancer Radiation and oral chemo with radiation.   Complication of anesthesia    Constipation    Edema, lower extremity    Family history of breast cancer    Family history of uterine cancer    Genital lesion, female    at times   Heart burn    occ.   Hx of seasonal allergies    Joint pain    Motion sickness    PONV (postoperative nausea and vomiting)    Rectal cancer (HCC)    Shortness of breath    Swallowing difficulty    Umbilicus discharge     Past Surgical History:  Procedure Laterality Date   ABDOMINAL HYSTERECTOMY     atypical cell, hx abnormal pap test   CESAREAN SECTION     COLON SURGERY     DIAGNOSTIC LAPAROSCOPY     with D and  C and Ovarian cystectomy, tubal ligation done at this time '[place]     XI ROBOTIC ASSISTED LOWER ANTERIOR RESECTION N/A 03/21/2016   Procedure: XI ROBOTIC ASSISTED LOWER ANTERIOR RESECTION;  Surgeon: Leighton Ruff, MD;  Location: [facility];  Service: General;  Laterality: N/A;    Current Medications: Current Meds  Medication Sig   hydrochlorothiazide (HYDRODIURIL) 25 MG tablet TAKE 1 TABLET(25 MG) BY MOUTH DAILY   OMEPRAZOLE PO Take by mouth.     Allergies:   Codeine, Morphine and related, Sucralose, Zolpidem tartrate, and Zolpidem tartrate   Social History   Socioeconomic History   Marital status: Legally Separated    Spouse name: Not on file   Number of children: 3    Years of education: Not on file   Highest education level: Not on file  Occupational History   Occupation: Education officer, community  Tobacco Use   Smoking status: Never    Passive exposure: Never   Smokeless tobacco: Never  Vaping Use   Vaping Use: Never used  Substance and Sexual Activity   Alcohol use: No    Alcohol/week: 0.0 standard drinks   Drug use: No   Sexual activity: Not on file  Other Topics Concern   Not on file  Social History Narrative   Not on file   Social Determinants of Health   Financial Resource Strain: Not on file  Food Insecurity: Not on file  Transportation Needs: Not on file  Physical Activity: Not on file  Stress: Not on file  Social Connections: Not on file     Family History: The patient's family history includes Alcoholism in her father; Breast cancer in her maternal grandmother; COPD in her father; Cancer (age of onset: 22) in her paternal grandmother; Heart attack in her paternal grandfather; Hypertension in her father, paternal grandfather, sister, and sister; Skin cancer in her cousin; Supraventricular tachycardia in her mother. ROS:   Please see the history of present illness.    All 14 point review of systems negative except as described per history of present illness.  EKGs/Labs/Other Studies Reviewed:    The following studies were reviewed today:   EKG:  EKG is  ordered today.  The ekg ordered today demonstrates normal sinus rhythm, normal P interval, nonspecific ST segment changes,  Recent Labs: 09/03/2021: ALT 13; BUN 13; Creatinine, Ser 0.83; Hemoglobin 13.6; Platelets 292.0; Potassium 4.0; Sodium 140  Recent Lipid Panel    Component Value Date/Time   CHOL 187 05/18/2020 1339   TRIG 60 05/18/2020 1339   HDL 59 05/18/2020 1339   CHOLHDL 3.6 10/29/2019 1148   CHOLHDL 4 05/21/2019 1124   VLDL 20.0 05/21/2019 1124   LDLCALC 117 (H) 05/18/2020 1339    Physical Exam:    VS:  BP 120/76 (BP Location: Left Arm)    Pulse 75    Ht 5'  2" (1.575 m)    Wt 201 lb 1.3 oz (91.2 kg)    SpO2 99%    BMI 36.78 kg/m     Wt Readings from Last 3 Encounters:  10/05/21 201 lb 1.3 oz (91.2 kg)  09/03/21 204 lb (92.5 kg)  06/18/21 197 lb 12.8 oz (89.7 kg)     GEN:  Well nourished, well developed in no acute distress HEENT: Normal NECK: No JVD; No carotid bruits LYMPHATICS: No lymphadenopathy CARDIAC: RRR, no murmurs, no rubs, no gallops RESPIRATORY:  Clear to auscultation without rales, wheezing  or rhonchi  ABDOMEN: Soft, non-tender, non-distended MUSCULOSKELETAL:  No edema; No deformity  SKIN: Warm and dry NEUROLOGIC:  Alert and oriented x 3 PSYCHIATRIC:  Normal affect   ASSESSMENT:    1. Raynaud's disease without gangrene   2. Atypical chest pain   3. Rectal cancer ([place])    PLAN:    In order of problems listed above:  Atypical chest pain, constellation of a lot of symptoms I think the one best test we can do to try to answer the question she had any significant heart pathology would be coronary CT angio which will also allow [place] to look at surrounding structures of her chest.  Until then asked to start taking 1 baby aspirin a day.  Overall his symptomatology is not too convincing for coronary disease but with her history and calcification of the aorta I think would be reasonable to perform coronary CT angio. Dyspnea on exertion: I will ask her to have an echocardiogram done to assess left ventricle ejection fraction. Dyslipidemia I did review her K PN which show me data from 2021 with LDL of 70 and HDL 59.  I will not initiate any treatment.  We will await results of this test before addressing that issues.   Medication Adjustments/Labs and Tests Ordered: Current medicines are reviewed at length with the patient today.  Concerns regarding medicines are outlined above.  No orders of the defined types were placed in this encounter.  No orders of the defined types were placed in this encounter.   Signed, Park Liter, MD, [facility]. 10/05/2021 4:25 PM    [facility]

## 2021-10-05 NOTE — Patient Instructions (Signed)
Medication Instructions:  Your physician has recommended you make the following change in your medication:   START: Aspirin 81 mg daily  *If you need a refill on your cardiac medications before your next appointment, please call your pharmacy*   Lab Work:  BMP 1 week before CT  If you have labs (blood work) drawn today and your tests are completely normal, you will receive your results only by: [place] (if you have MyChart) OR A paper copy in the mail If you have any lab test that is abnormal or we need to change your treatment, we will call you to review the results.   Testing/Procedures: Your physician has requested that you have an echocardiogram. Echocardiography is a painless test that uses sound waves to create images of your heart. It provides your doctor with information about the size and shape of your heart and how well your hearts chambers and valves are working. This procedure takes approximately one hour. There are no restrictions for this procedure.    Your cardiac CT will be scheduled at one of the below locations:   [facility] [address] [place], [place] [address] [phone]  [place] [address] [place], [place] [address] [phone]  If scheduled at [facility], please arrive at the [facility] main entrance (entrance A) of [facility] 30 minutes prior to test start time. You can use the FREE valet parking offered at the main entrance (encouraged to control the heart rate for the test) Proceed to the [facility] Radiology Department (first floor) to check-in and test prep.  If scheduled at [facility], please arrive 15 mins early for check-in and test prep.  Please follow these instructions carefully (unless otherwise directed):  On the Night Before the Test: Be sure to Drink plenty of water. Do not consume any  caffeinated/decaffeinated beverages or chocolate 12 hours prior to your test. Do not take any antihistamines 12 hours prior to your test.  On the Day of the Test: Drink plenty of water until 1 hour prior to the test. Do not eat any food 4 hours prior to the test. You may take your regular medications prior to the test.  Take metoprolol (Lopressor) two hours prior to test. HOLD Hydrochlorothiazide morning of the test. FEMALES- please wear underwire-free bra if available, avoid dresses & tight clothing       After the Test: Drink plenty of water. After receiving IV contrast, you may experience a mild flushed feeling. This is normal. On occasion, you may experience a mild rash up to 24 hours after the test. This is not dangerous. If this occurs, you can take Benadryl 25 mg and increase your fluid intake. If you experience trouble breathing, this can be serious. If it is severe call 911 IMMEDIATELY. If it is mild, please call our office..  We will call to schedule your test 2-4 weeks out understanding that some insurance companies will need an authorization prior to the service being performed.   For non-scheduling related questions, please contact the cardiac imaging nurse navigator should you have any questions/concerns: Marchia Bond, Cardiac Imaging Nurse Navigator Gordy Clement, Cardiac Imaging Nurse Navigator [facility] Direct Office Dial: [phone]   For scheduling needs, including cancellations and rescheduling, please call [place], [phone].    Follow-Up: At [facility], you and your health needs are our priority.  As part of our continuing  mission to provide you with exceptional heart care, we have created designated Provider Care Teams.  These Care Teams include your primary Cardiologist (physician) and Advanced Practice Providers (APPs -  Physician Assistants and Nurse Practitioners) who all work together to provide you with the care you  need, when you need it.  We recommend signing up for the patient portal called "MyChart".  Sign up information is provided on this After Visit Summary.  MyChart is used to connect with patients for Virtual Visits (Telemedicine).  Patients are able to view lab/test results, encounter notes, upcoming appointments, etc.  Non-urgent messages can be sent to your provider as well.   To learn more about what you can do with MyChart, go to [email or web address].    Your next appointment:   2 month(s)  The format for your next appointment:   In Person  Provider:   Jenne Campus, MD    Other Instructions None

## 2021-10-08 ENCOUNTER — Ambulatory Visit (HOSPITAL_BASED_OUTPATIENT_CLINIC_OR_DEPARTMENT_OTHER)
Admission: RE | Admit: 2021-10-08 | Discharge: 2021-10-08 | Disposition: A | Discharge status: Home / Self Care | Payer: 59 | Source: Ambulatory Visit | Attending: Cardiology | Admitting: Cardiology

## 2021-10-08 ENCOUNTER — Other Ambulatory Visit: Payer: Self-pay

## 2021-10-08 DIAGNOSIS — R0609 Other forms of dyspnea: Secondary | ICD-10-CM

## 2021-10-08 DIAGNOSIS — R079 Chest pain, unspecified: Secondary | ICD-10-CM | POA: Diagnosis present

## 2021-10-08 DIAGNOSIS — R0789 Other chest pain: Secondary | ICD-10-CM | POA: Insufficient documentation

## 2021-10-08 DIAGNOSIS — I7300 Raynaud's syndrome without gangrene: Secondary | ICD-10-CM | POA: Insufficient documentation

## 2021-10-08 DIAGNOSIS — C20 Malignant neoplasm of rectum: Secondary | ICD-10-CM | POA: Diagnosis present

## 2021-10-08 LAB — ECHOCARDIOGRAM COMPLETE
AR max vel: 2.89 cm2
AV Area VTI: 2.79 cm2
AV Area mean vel: 2.69 cm2
AV Mean grad: 2 mmHg
AV Peak grad: 4 mmHg
Ao pk vel: 1 m/s
Area-P 1/2: 4.33 cm2
S' Lateral: 3 cm

## 2021-10-08 NOTE — Progress Notes (Signed)
°  Echocardiogram 2D Echocardiogram has been performed.  Kaitlyn Morgan F 10/08/2021, 10:21 AM

## 2021-10-13 ENCOUNTER — Telehealth: Payer: Self-pay | Admitting: Family Medicine

## 2021-10-13 ENCOUNTER — Telehealth: Payer: Self-pay

## 2021-10-13 NOTE — Telephone Encounter (Signed)
Unable to reach the patient or LM (VM not set up. As a last attempt, a letter has been mailed requesting a call back. ?

## 2021-10-13 NOTE — Telephone Encounter (Signed)
Who Is Calling Patient / Member / Family / Caregiver ?Call Type Triage / Clinical ?Relationship To Patient Self ?Return Phone Number [phone] (Primary) ?Chief Complaint CHEST PAIN - pain, pressure, heaviness or ?tightness ?Reason for Call Symptomatic / Request for Health Information ?Initial Comment Caller states she is calling to make an appointment ?with her primary to see if she needs to take ?something for a panic attack. Caller states it ?feels like she's having a stroke but also could be ?symptoms of a panic attack. Caller states last night ?her leg wentr numb and her arm, along with the ?right side of the face felt numb. She had trouble ?breathing. Caller States an hour or so later it felt ?like her breathing was better, it lasted 2-3 hours. ?Caller states she gets weak and sick for days after. ?Caller states she doesn't want to have a stroke. ?Caller States her chest feels weird. Caller states ?she has a feeling in her chest, feels like she cant ?breathe, head feels numb/weird, eye hurts behind ?the eye. ?Translation No ?Nurse Assessment ?Nurse: Adrian Blackwater, RN, Claiborne Billings Date/Time Eilene Ghazi Time): 10/13/2021 12:36:07 PM ?Confirm and document reason for call. If ?symptomatic, describe symptoms. ?---Caller states last night she had tingling and ?weakness in the Right side of her body. This lasted 3 ?hours and pt did not call 911 or go to the ER. Pt also ?gets panic attacks so she does not know whether her ?feelings are physical or emotional. ? ?10/13/2021 12:44:24 PM Go to ED Now (or PCP triage) Yes Adrian Blackwater, RN, Claiborne Billings ? ?

## 2021-10-13 NOTE — Telephone Encounter (Signed)
Noted. Waiting for triage note ?

## 2021-10-13 NOTE — Telephone Encounter (Signed)
Pt states she has panic attacks often.  ? ?She's unsure if its stroke symps or just panic attack symps.  ? ?Transferred to triage  ?

## 2021-10-13 NOTE — Telephone Encounter (Signed)
-----   Message from Park Liter, MD sent at 10/11/2021  2:21 PM EST ----- ?Echocardiogram showed preserved left ventricle ejection fraction, mildly dilated left atrium, overall looks good ?

## 2021-10-14 NOTE — Telephone Encounter (Signed)
Spoke with patient. Pt states she did not go the ER because she didn't have the energy to drive and wait to be seen. Pt states still feeling overwhelmed and trouble catching her breathe but states the tingling and weakness has gone away. Pt reports no facial drooping or slurred speech. Pt was advised if sxs return or worsen to go to the ER. Pt was scheduled for tomorrow morning.  ?

## 2021-10-15 ENCOUNTER — Ambulatory Visit (INDEPENDENT_AMBULATORY_CARE_PROVIDER_SITE_OTHER): Payer: 59 | Admitting: Family Medicine

## 2021-10-15 ENCOUNTER — Telehealth: Payer: Self-pay | Admitting: Cardiology

## 2021-10-15 ENCOUNTER — Encounter: Payer: Self-pay | Admitting: Family Medicine

## 2021-10-15 VITALS — BP 110/80 | HR 78 | Temp 97.8°F | Resp 18 | Ht 62.0 in | Wt 201.0 lb

## 2021-10-15 DIAGNOSIS — F410 Panic disorder [episodic paroxysmal anxiety] without agoraphobia: Secondary | ICD-10-CM | POA: Diagnosis not present

## 2021-10-15 LAB — CBC WITH DIFFERENTIAL/PLATELET
Basophils Absolute: 0 10*3/uL (ref 0.0–0.1)
Basophils Relative: 1 % (ref 0.0–3.0)
Eosinophils Absolute: 0.1 10*3/uL (ref 0.0–0.7)
Eosinophils Relative: 1.6 % (ref 0.0–5.0)
HCT: 41.2 % (ref 36.0–46.0)
Hemoglobin: 14.1 g/dL (ref 12.0–15.0)
Lymphocytes Relative: 26.4 % (ref 12.0–46.0)
Lymphs Abs: 1.2 10*3/uL (ref 0.7–4.0)
MCHC: 34.2 g/dL (ref 30.0–36.0)
MCV: 88 fl (ref 78.0–100.0)
Monocytes Absolute: 0.4 10*3/uL (ref 0.1–1.0)
Monocytes Relative: 8.2 % (ref 3.0–12.0)
Neutro Abs: 3 10*3/uL (ref 1.4–7.7)
Neutrophils Relative %: 62.8 % (ref 43.0–77.0)
Platelets: 277 10*3/uL (ref 150.0–400.0)
RBC: 4.68 Mil/uL (ref 3.87–5.11)
RDW: 13.4 % (ref 11.5–15.5)
WBC: 4.7 10*3/uL (ref 4.0–10.5)

## 2021-10-15 LAB — COMPREHENSIVE METABOLIC PANEL
ALT: 9 U/L (ref 0–35)
AST: 15 U/L (ref 0–37)
Albumin: 4.7 g/dL (ref 3.5–5.2)
Alkaline Phosphatase: 74 U/L (ref 39–117)
BUN: 15 mg/dL (ref 6–23)
CO2: 30 mEq/L (ref 19–32)
Calcium: 9.8 mg/dL (ref 8.4–10.5)
Chloride: 100 mEq/L (ref 96–112)
Creatinine, Ser: 0.88 mg/dL (ref 0.40–1.20)
GFR: 74.55 mL/min (ref >60.00)
Glucose, Bld: 74 mg/dL (ref 70–99)
Potassium: 4 mEq/L (ref 3.5–5.1)
Sodium: 140 mEq/L (ref 135–145)
Total Bilirubin: 0.9 mg/dL (ref 0.2–1.2)
Total Protein: 7.6 g/dL (ref 6.0–8.3)

## 2021-10-15 MED ORDER — ALPRAZOLAM 0.25 MG PO TABS: 0.2500 mg | ORAL_TABLET | Freq: Two times a day (BID) | ORAL | 0 refills | Status: DC | PRN

## 2021-10-15 MED ORDER — ESCITALOPRAM OXALATE 10 MG PO TABS: 10.0000 mg | ORAL_TABLET | Freq: Every day | ORAL | 2 refills | Status: DC

## 2021-10-15 NOTE — Assessment & Plan Note (Signed)
Start lexapro qd and xanax prn  ?F/u 1 month or sooner prn  ?Refer to [facility] ? ?

## 2021-10-15 NOTE — Progress Notes (Signed)
Subjective:   By signing my name below, I, Zite Okoli, attest that this documentation has been prepared under the direction and in the presence of [name], DO. 10/15/2021     Patient ID: Kaitlyn Morgan, female    DOB: 1966/07/16, 56 y.o.   MRN: [id number]  Chief Complaint  Patient presents with   Fatigue    HPI Patient is in today for an office visit.  She reports she has been having panic attacks. She has a family history of panic attacks. She adds she was very anxious when she was younger because her father was an alcoholic. She reports she is under a lot of stress at this time due to her health and family issues and her boyfriend being an alcoholic. She finds it hard to breathe while at home and is feeling very overwhelmed at this time and has no strength to do anything. She went to the gym the past weekend and was very relaxed. She is requesting for medication that will help her calm down and give her the strength she needs to make decisions.   She reports she has been experiencing severe jaw pain since her dental implant. She has difficulty eating, yawning and brushing her teeth. The pain has spread down to her neck and is behind her eyes. She notes it is definitely adding to her stress.She is following up with a dentist.   Past Medical History:  Diagnosis Date   Anemia    during pregnancy, not now.   Arthritis    osteoarthritis-neck"some issues with cervical vertebrae"   Asthma    Atypical syncope    1 episode -never had follow up with cardiology-never any further episodes, did go to urgent care.   Back pain    Colon cancer ([facility])    rectal cancer Radiation and oral chemo with radiation.   Complication of anesthesia    Constipation    Edema, lower extremity    Family history of breast cancer    Family history of uterine cancer    Genital lesion, female    at times   Heart burn    occ.   Hx of seasonal allergies    Joint pain    Motion sickness    PONV  (postoperative nausea and vomiting)    Rectal cancer (H[facility])    Shortness of breath    Swallowing difficulty    Umbilicus discharge     Past Surgical History:  Procedure Laterality Date   ABDOMINAL HYSTERECTOMY     atypical cell, hx abnormal pap test   CESAREAN SECTION     COLON SURGERY     DIAGNOSTIC LAPAROSCOPY     with D and C and Ovarian cystectomy, tubal ligation done at this time '00   DILATION AND CURETTAGE OF UTERUS     TUBAL LIGATION     XI ROBOTIC ASSISTED LOWER ANTERIOR RESECTION N/A 03/21/2016   Procedure: XI ROBOTIC ASSISTED LOWER ANTERIOR RESECTION;  Surgeon: Leighton Ruff, MD;  Location: [facility];  Service: General;  Laterality: N/A;    Family History  Problem Relation Age of Onset   Breast cancer Maternal Grandmother        dx in her 107s   Cancer Paternal Grandmother 54       uterine cancer    Hypertension Father    COPD Father    Alcoholism Father    Hypertension Sister    Hypertension Paternal Grandfather    Heart attack Paternal Grandfather  Hypertension Sister    Supraventricular tachycardia Mother    Skin cancer Cousin        maternal first cousin    Social History   Socioeconomic History   Marital status: Legally Separated    Spouse name: Not on file   Number of children: 3   Years of education: Not on file   Highest education level: Not on file  Occupational History   Occupation: Education officer, community  Tobacco Use   Smoking status: Never    Passive exposure: Never   Smokeless tobacco: Never  Vaping Use   Vaping Use: Never used  Substance and Sexual Activity   Alcohol use: No    Alcohol/week: 0.0 standard drinks   Drug use: No   Sexual activity: Not on file  Other Topics Concern   Not on file  Social History Narrative   Not on file   Social Determinants of Health   Financial Resource Strain: Not on file  Food Insecurity: Not on file  Transportation Needs: Not on file  Physical Activity: Not on file  Stress: Not on file  Social  Connections: Not on file  Intimate Partner Violence: Not on file    Outpatient Medications Prior to Visit  Medication Sig Dispense Refill   aspirin EC 81 MG tablet Take 1 tablet (81 mg total) by mouth daily. Swallow whole. 90 tablet 3   hydrochlorothiazide (HYDRODIURIL) 25 MG tablet TAKE 1 TABLET(25 MG) BY MOUTH DAILY 90 tablet 3   OMEPRAZOLE PO Take by mouth.     metoprolol tartrate (LOPRESSOR) 100 MG tablet Take 1 tablet (100 mg total) by mouth once for 1 dose. Please take 2 hours before CT 1 tablet 0   meloxicam (MOBIC) 15 MG tablet Take 1 tablet (15 mg total) by mouth daily. (Patient not taking: Reported on 09/03/2021) 30 tablet 2   VIVELLE-DOT 0.1 MG/24HR patch 1 patch 2 (two) times a week. (Patient not taking: Reported on 10/05/2021)     No facility-administered medications prior to visit.    Allergies  Allergen Reactions   Codeine Nausea Only   Morphine And Related Itching and Nausea Only   Sucralose Nausea And Vomiting    Fever   Zolpidem Tartrate     Hallucinations    Zolpidem Tartrate     Other reaction(s): Other (See Comments) Hallucinations Hallucinations    Review of Systems  Constitutional:  Negative for fever.  HENT:  Negative for congestion, ear pain, hearing loss, sinus pain and sore throat.        (+) left jaw pain   Eyes:  Negative for blurred vision and pain.  Respiratory:  Negative for cough, sputum production, shortness of breath and wheezing.   Cardiovascular:  Negative for chest pain and palpitations.  Gastrointestinal:  Negative for blood in stool, constipation, diarrhea, nausea and vomiting.  Genitourinary:  Negative for dysuria, frequency, hematuria and urgency.  Musculoskeletal:  Negative for back pain, falls and myalgias.  Neurological:  Negative for dizziness, sensory change, loss of consciousness, weakness and headaches.  Endo/Heme/Allergies:  Negative for environmental allergies. Does not bruise/bleed easily.  Psychiatric/Behavioral:  Negative  for depression and suicidal ideas. The patient is nervous/anxious. The patient does not have insomnia.       Objective:    Physical Exam Constitutional:      General: She is not in acute distress.    Appearance: Normal appearance. She is not ill-appearing.  HENT:     Head: Normocephalic and atraumatic.  Right Ear: External ear normal.     Left Ear: External ear normal.  Eyes:     Extraocular Movements: Extraocular movements intact.     Pupils: Pupils are equal, round, and reactive to light.  Cardiovascular:     Rate and Rhythm: Normal rate and regular rhythm.     Pulses: Normal pulses.     Heart sounds: Normal heart sounds. No murmur heard.   No gallop.  Pulmonary:     Effort: Pulmonary effort is normal. No respiratory distress.     Breath sounds: Normal breath sounds. No wheezing, rhonchi or rales.  Abdominal:     General: Bowel sounds are normal. There is no distension.     Palpations: Abdomen is soft. There is no mass.     Tenderness: There is no abdominal tenderness. There is no guarding or rebound.     Hernia: No hernia is present.  Musculoskeletal:     Cervical back: Normal range of motion and neck supple.  Lymphadenopathy:     Cervical: No cervical adenopathy.  Skin:    General: Skin is warm and dry.  Neurological:     Mental Status: She is alert and oriented to person, place, and time.  Psychiatric:        Behavior: Behavior normal.    BP 110/80 (BP Location: Right Arm, Patient Position: Sitting, Cuff Size: Large)    Pulse 78    Temp 97.8 F (36.6 C) (Oral)    Resp 18    Ht '5\' 2"'  (1.575 m)    Wt 201 lb (91.2 kg)    SpO2 98%    BMI 36.76 kg/m  Wt Readings from Last 3 Encounters:  10/15/21 201 lb (91.2 kg)  10/05/21 201 lb 1.3 oz (91.2 kg)  09/03/21 204 lb (92.5 kg)    Diabetic Foot Exam - Simple   No data filed    Lab Results  Component Value Date   WBC 4.5 09/03/2021   HGB 13.6 09/03/2021   HCT 41.0 09/03/2021   PLT 292.0 09/03/2021   GLUCOSE 86  09/03/2021   CHOL 187 05/18/2020   TRIG 60 05/18/2020   HDL 59 05/18/2020   LDLCALC 117 (H) 05/18/2020   ALT 13 09/03/2021   AST 17 09/03/2021   NA 140 09/03/2021   K 4.0 09/03/2021   CL 104 09/03/2021   CREATININE 0.83 09/03/2021   BUN 13 09/03/2021   CO2 30 09/03/2021   TSH 3.860 10/29/2019   HGBA1C 4.9 05/18/2020    Lab Results  Component Value Date   TSH 3.860 10/29/2019   Lab Results  Component Value Date   WBC 4.5 09/03/2021   HGB 13.6 09/03/2021   HCT 41.0 09/03/2021   MCV 89.9 09/03/2021   PLT 292.0 09/03/2021   Lab Results  Component Value Date   NA 140 09/03/2021   K 4.0 09/03/2021   CHLORIDE 106 04/19/2017   CO2 30 09/03/2021   GLUCOSE 86 09/03/2021   BUN 13 09/03/2021   CREATININE 0.83 09/03/2021   BILITOT 0.9 09/03/2021   ALKPHOS 67 09/03/2021   AST 17 09/03/2021   ALT 13 09/03/2021   PROT 7.3 09/03/2021   ALBUMIN 4.3 09/03/2021   CALCIUM 9.1 09/03/2021   ANIONGAP 11 02/05/2021   EGFR 82 (L) 04/19/2017   GFR 80.04 09/03/2021   Lab Results  Component Value Date   CHOL 187 05/18/2020   Lab Results  Component Value Date   HDL 59 05/18/2020   Lab Results  Component Value Date   LDLCALC 117 (H) 05/18/2020   Lab Results  Component Value Date   TRIG 60 05/18/2020   Lab Results  Component Value Date   CHOLHDL 3.6 10/29/2019   Lab Results  Component Value Date   HGBA1C 4.9 05/18/2020       Assessment & Plan:   Problem List Items Addressed This Visit       Unprioritized   Severe anxiety with panic - Primary    Start lexapro qd and xanax prn  F/u 1 month or sooner prn  Refer to [facility]       Relevant Medications   escitalopram (LEXAPRO) 10 MG tablet   ALPRAZolam (XANAX) 0.25 MG tablet   Other Relevant Orders   Ambulatory referral to Psychology   CBC with Differential/Platelet   Comprehensive metabolic panel   Thyroid Panel With TSH    Meds ordered this encounter  Medications   escitalopram (LEXAPRO) 10 MG tablet     Sig: Take 1 tablet (10 mg total) by mouth daily.    Dispense:  30 tablet    Refill:  2   ALPRAZolam (XANAX) 0.25 MG tablet    Sig: Take 1 tablet (0.25 mg total) by mouth 2 (two) times daily as needed for anxiety.    Dispense:  20 tablet    Refill:  0    I,Zite Okoli,acting as a scribe for [employer], DO.,have documented all relevant documentation on the behalf of [name], DO,as directed by  [name], DO while in the presence of [name], [place], DO., personally preformed the services described in this documentation.  All medical record entries made by the scribe were at my direction and in my presence.  I have reviewed the chart and discharge instructions (if applicable) and agree that the record reflects my personal performance and is accurate and complete. 10/15/2021

## 2021-10-15 NOTE — Telephone Encounter (Signed)
Pt c/o medication issue: ? ?1. Name of Medication:  ?ALPRAZolam (XANAX) 0.25 MG tablet ?escitalopram (LEXAPRO) 10 MG tablet ? ?2. How are you currently taking this medication (dosage and times per day)? Has not taken ? ?3. Are you having a reaction (difficulty breathing--STAT)? no ? ?4. What is your medication issue? Patient states these medications were called in for her and she is not sure if she can take them prior to her CT. She also says something else was called in to take prior to the test and she was told not to take her fluid pill.  ?

## 2021-10-15 NOTE — Patient Instructions (Signed)
Managing Anxiety, Adult ?After being diagnosed with anxiety, you may be relieved to know why you have felt or behaved a certain way. You may also feel overwhelmed about the treatment ahead and what it will mean for your life. With care and support, you can manage this condition. ?How to manage lifestyle changes ?Managing stress and anxiety ?Stress is your body's reaction to life changes and events, both good and bad. Most stress will last just a few hours, but stress can be ongoing and can lead to more than just stress. Although stress can play a major role in anxiety, it is not the same as anxiety. Stress is usually caused by something external, such as a deadline, test, or competition. Stress normally passes after the triggering event has ended.  ?Anxiety is caused by something internal, such as imagining a terrible outcome or worrying that something will go wrong that will devastate you. Anxiety often does not go away even after the triggering event is over, and it can become long-term (chronic) worry. It is important to understand the differences between stress and anxiety and to manage your stress effectively so that it does not lead to an anxious response. ?Talk with your health care provider or a counselor to learn more about reducing anxiety and stress. He or she may suggest tension reduction techniques, such as: ?Music therapy. Spend time creating or listening to music that you enjoy and that inspires you. ?Mindfulness-based meditation. Practice being aware of your normal breaths while not trying to control your breathing. It can be done while sitting or walking. ?Centering prayer. This involves focusing on a word, phrase, or sacred image that means something to you and brings you peace. ?Deep breathing. To do this, expand your stomach and inhale slowly through your nose. Hold your breath for 3-5 seconds. Then exhale slowly, letting your stomach muscles relax. ?Self-talk. Learn to notice and identify  thought patterns that lead to anxiety reactions and change those patterns to thoughts that feel peaceful. ?Muscle relaxation. Taking time to tense muscles and then relax them. ?Choose a tension reduction technique that fits your lifestyle and personality. These techniques take time and practice. Set aside 5-15 minutes a day to do them. Therapists can offer counseling and training in these techniques. The training to help with anxiety may be covered by some insurance plans. ?Other things you can do to manage stress and anxiety include: ?Keeping a stress diary. This can help you learn what triggers your reaction and then learn ways to manage your response. ?Thinking about how you react to certain situations. You may not be able to control everything, but you can control your response. ?Making time for activities that help you relax and not feeling guilty about spending your time in this way. ?Doing visual imagery. This involves imagining or creating mental pictures to help you relax. ?Practicing yoga. Through yoga poses, you can lower tension and promote relaxation. ? ?Medicines ?Medicines can help ease symptoms. Medicines for anxiety include: ?Antidepressant medicines. These are usually prescribed for long-term daily control. ?Anti-anxiety medicines. These may be added in severe cases, especially when panic attacks occur. ?Medicines will be prescribed by a health care provider. When used together, medicines, psychotherapy, and tension reduction techniques may be the most effective treatment. ?Relationships ?Relationships can play a big part in helping you recover. Try to spend more time connecting with trusted friends and family members. ?Consider going to couples counseling if you have a partner, taking family education classes, or going to family   therapy. ?Therapy can help you and others better understand your condition. ?How to recognize changes in your anxiety ?Everyone responds differently to treatment for  anxiety. Recovery from anxiety happens when symptoms decrease and stop interfering with your daily activities at home or work. This may mean that you will start to: ?Have better concentration and focus. Worry will interfere less in your daily thinking. ?Sleep better. ?Be less irritable. ?Have more energy. ?Have improved memory. ?It is also important to recognize when your condition is getting worse. Contact your health care provider if your symptoms interfere with home or work and you feel like your condition is not improving. ?Follow these instructions at home: ?Activity ?Exercise. Adults should do the following: ?Exercise for at least 150 minutes each week. The exercise should increase your heart rate and make you sweat (moderate-intensity exercise). ?Strengthening exercises at least twice a week. ?Get the right amount and quality of sleep. Most adults need 7-9 hours of sleep each night. ?Lifestyle ? ?Eat a healthy diet that includes plenty of vegetables, fruits, whole grains, low-fat dairy products, and lean protein. ?Do not eat a lot of foods that are high in fats, added sugars, or salt (sodium). ?Make choices that simplify your life. ?Do not use any products that contain nicotine or tobacco. These products include cigarettes, chewing tobacco, and vaping devices, such as e-cigarettes. If you need help quitting, ask your health care provider. ?Avoid caffeine, alcohol, and certain over-the-counter cold medicines. These may make you feel worse. Ask your pharmacist which medicines to avoid. ?General instructions ?Take over-the-counter and prescription medicines only as told by your health care provider. ?Keep all follow-up visits. This is important. ?Where to find support ?You can get help and support from these sources: ?Self-help groups. ?Online and community organizations. ?A trusted spiritual leader. ?Couples counseling. ?Family education classes. ?Family therapy. ?Where to find more information ?You may find  that joining a support group helps you deal with your anxiety. The following sources can help you locate counselors or support groups near you: ?Mental Health America: www.mentalhealthamerica.net ?Anxiety and Depression Association of America (ADAA): www.adaa.org ?National Alliance on Mental Illness (NAMI): www.nami.org ?Contact a health care provider if: ?You have a hard time staying focused or finishing daily tasks. ?You spend many hours a day feeling worried about everyday life. ?You become exhausted by worry. ?You start to have headaches or frequently feel tense. ?You develop chronic nausea or diarrhea. ?Get help right away if: ?You have a racing heart and shortness of breath. ?You have thoughts of hurting yourself or others. ?If you ever feel like you may hurt yourself or others, or have thoughts about taking your own life, get help right away. Go to your nearest emergency department or: ?Call your local emergency services (911 in the [place] ?Call a suicide crisis helpline, such as the National Suicide Prevention Lifeline at 1-800-273-8255 or 988 in the [place] This is open 24 hours a day in the [place] ?Text the Crisis Text Line at 741741 (in the [place] ?Summary ?Taking steps to learn and use tension reduction techniques can help calm you and help prevent triggering an anxiety reaction. ?When used together, medicines, psychotherapy, and tension reduction techniques may be the most effective treatment. ?Family, friends, and partners can play a big part in supporting you. ?This information is not intended to replace advice given to you by your health care provider. Make sure you discuss any questions you have with your health care provider. ?Document Revised: 02/24/2021 Document Reviewed: 11/22/2020 ?Elsevier Patient   Education ? 2022 [employer] Inc. ? ?

## 2021-10-15 NOTE — Telephone Encounter (Signed)
Called patient to find out more information and she did not answer the phone and her voice mailbox was full so I was unable to leave a message. ?

## 2021-10-16 LAB — THYROID PANEL WITH TSH
Free Thyroxine Index: 2.4 (ref 1.4–3.8)
T3 Uptake: 25 % (ref 22–35)
T4, Total: 9.6 ug/dL (ref 5.1–11.9)
TSH: 3.51 mIU/L

## 2021-10-18 NOTE — Telephone Encounter (Signed)
Attempted to call the patient. ? ?No answer- I did leave a detailed message on her voice mail (ok per DPR), stating she: ?1) can take Xanax & Lexapro prior to her Cardiac CT ?2)  she will need to take metoprolol tartrate (100 mg) 2 hours prior to the scan, which has been called to the pharmacy ?-advised this was to lower her heart rate so we get really good pictures during the test ?3) she should hold HCTZ the morning of her test ? ?I asked her to call back with any further questions/ concerns. ? ? ? ?

## 2021-10-20 ENCOUNTER — Telehealth (HOSPITAL_COMMUNITY): Payer: Self-pay | Admitting: Emergency Medicine

## 2021-10-20 ENCOUNTER — Encounter (HOSPITAL_COMMUNITY): Payer: Self-pay

## 2021-10-21 NOTE — Telephone Encounter (Signed)
Reaching out to patient to offer assistance regarding upcoming cardiac imaging study; pt verbalizes understanding of appt date/time, parking situation and where to check in, pre-test NPO status and medications ordered, and verified current allergies; name and call back number provided for further questions should they arise ?Marchia Bond RN Navigator Cardiac Imaging ?[facility] ?[phone] office ?[phone] cell ? ?'100mg'$  metoprolol tart ?Hold HCTZ  ?Denies iv issues  ?Arrival 1030 ? ?

## 2021-10-22 ENCOUNTER — Ambulatory Visit (HOSPITAL_COMMUNITY)
Admission: RE | Admit: 2021-10-22 | Discharge: 2021-10-22 | Disposition: A | Discharge status: Home / Self Care | Payer: 59 | Source: Ambulatory Visit | Attending: Cardiology | Admitting: Cardiology

## 2021-10-22 ENCOUNTER — Other Ambulatory Visit: Payer: Self-pay

## 2021-10-22 DIAGNOSIS — R079 Chest pain, unspecified: Secondary | ICD-10-CM

## 2021-10-22 MED ORDER — NITROGLYCERIN 0.4 MG SL SUBL: 0.8000 mg | SUBLINGUAL_TABLET | Freq: Once | SUBLINGUAL | Status: DC

## 2021-10-22 MED ORDER — NITROGLYCERIN 0.4 MG SL SUBL
SUBLINGUAL_TABLET | SUBLINGUAL | Status: AC
  Administered 2021-10-22: 0.8 mg
  Filled 2021-10-22: qty 2

## 2021-10-22 MED ORDER — IOHEXOL 350 MG/ML SOLN
100.0000 mL | Freq: Once | INTRAVENOUS | Status: AC | PRN
  Administered 2021-10-22: 100 mL via INTRAVENOUS

## 2021-10-25 ENCOUNTER — Other Ambulatory Visit: Payer: Self-pay

## 2021-10-25 ENCOUNTER — Encounter (INDEPENDENT_AMBULATORY_CARE_PROVIDER_SITE_OTHER): Payer: Self-pay | Admitting: Family Medicine

## 2021-10-25 ENCOUNTER — Ambulatory Visit (INDEPENDENT_AMBULATORY_CARE_PROVIDER_SITE_OTHER): Payer: 59 | Admitting: Family Medicine

## 2021-10-25 VITALS — BP 120/74 | HR 60 | Temp 97.8°F | Ht 62.0 in | Wt 198.0 lb

## 2021-10-25 DIAGNOSIS — R109 Unspecified abdominal pain: Secondary | ICD-10-CM

## 2021-10-25 DIAGNOSIS — E8881 Metabolic syndrome: Secondary | ICD-10-CM | POA: Diagnosis not present

## 2021-10-25 DIAGNOSIS — E669 Obesity, unspecified: Secondary | ICD-10-CM

## 2021-10-25 DIAGNOSIS — G8929 Other chronic pain: Secondary | ICD-10-CM

## 2021-10-25 DIAGNOSIS — F4323 Adjustment disorder with mixed anxiety and depressed mood: Secondary | ICD-10-CM

## 2021-10-25 DIAGNOSIS — Z6836 Body mass index (BMI) 36.0-36.9, adult: Secondary | ICD-10-CM

## 2021-10-25 DIAGNOSIS — E88819 Insulin resistance, unspecified: Secondary | ICD-10-CM

## 2021-10-28 NOTE — Progress Notes (Signed)
Chief Complaint:   OBESITY Kaitlyn Morgan is here to discuss her progress with her obesity treatment plan along with follow-up of her obesity related diagnoses. See Medical Weight Management Flowsheet for complete bioelectrical impedance results.  Today's visit was #: 20 Starting weight: 210 lbs Starting date: 11/01/2019 Weight change since last visit: +12 lbs Total lbs lost to date: 12 lbs Total weight loss percentage to date: -5.71%  Nutrition Plan: Category 1 Plan for 0% of the time.  Activity: Increased activity.  Assessment/Plan:   1. Insulin resistance with polyphagia Not optimized. Current treatment: None.    Plan: She will continue to focus on protein-rich, low simple carbohydrate foods. We reviewed the importance of hydration, regular exercise for stress reduction, and restorative sleep.  2. Chronic abdominal pain Kaitlyn Morgan is considering umbilical hernia repair. We will continue to monitor symptoms as they relate to her weight loss journey.  3. Situational mixed anxiety and depressive disorder Kaitlyn Morgan is taking Lexapro 10 mg daily and alprazolam 0.25 mg twice daily as needed for anxiety. Motivational interviewing as well as evidence-based interventions for health behavior change were utilized today including the discussion of self monitoring techniques, problem-solving barriers and SMART goal setting techniques.  Specifically regarding patient's less desirable eating habits and patterns, we employed the technique of small changes when she cannot fully commit to her prudent nutritional plan. Discussed social challenges and choosing foods in social situations. Discussed assertive communication skills for coping with social relationships, relapse prevention, and possible challenges ahead and how to overcome.  4. Obesity with current BMI of 36.3  Course: Kaitlyn Morgan is currently in the action stage of change. As such, her goal is to continue with weight loss efforts.   Nutrition  goals: She has agreed to the Category 1 Plan.   Exercise goals:  As is.  Behavioral modification strategies: increasing lean protein intake, decreasing simple carbohydrates, increasing vegetables, and increasing water intake.  Kaitlyn Morgan has agreed to follow-up with our clinic in 4 weeks. She was informed of the importance of frequent follow-up visits to maximize her success with intensive lifestyle modifications for her multiple health conditions.   Objective:   Blood pressure 120/74, pulse 60, temperature 97.8 F (36.6 C), temperature source Oral, height '5\' 2"'$  (1.575 m), weight 198 lb (89.8 kg), SpO2 100 %. Body mass index is 36.21 kg/m.  General: Cooperative, alert, well developed, in no acute distress. HEENT: Conjunctivae and lids unremarkable. Cardiovascular: Regular rhythm.  Lungs: Normal work of breathing. Neurologic: No focal deficits.   Lab Results  Component Value Date   CREATININE 0.88 10/15/2021   BUN 15 10/15/2021   NA 140 10/15/2021   K 4.0 10/15/2021   CL 100 10/15/2021   CO2 30 10/15/2021   Lab Results  Component Value Date   ALT 9 10/15/2021   AST 15 10/15/2021   ALKPHOS 74 10/15/2021   BILITOT 0.9 10/15/2021   Lab Results  Component Value Date   HGBA1C 4.9 05/18/2020   HGBA1C 5.1 10/29/2019   HGBA1C 5.0 03/14/2016   Lab Results  Component Value Date   INSULIN 7.5 05/18/2020   INSULIN 14.7 10/29/2019   Lab Results  Component Value Date   TSH 3.51 10/15/2021   Lab Results  Component Value Date   CHOL 187 05/18/2020   HDL 59 05/18/2020   LDLCALC 117 (H) 05/18/2020   TRIG 60 05/18/2020   CHOLHDL 3.6 10/29/2019   Lab Results  Component Value Date   VD25OH 35.9 10/29/2019   VD25OH  26.32 (L) 05/21/2019   Lab Results  Component Value Date   WBC 4.7 10/15/2021   HGB 14.1 10/15/2021   HCT 41.2 10/15/2021   MCV 88.0 10/15/2021   PLT 277.0 10/15/2021   Lab Results  Component Value Date   IRON 59 05/18/2020   TIBC 362 05/18/2020    FERRITIN 70 05/18/2020   Attestation Statements:   Reviewed by clinician on day of visit: allergies, medications, problem list, medical history, surgical history, family history, social history, and previous encounter notes.  Time spent on visit including pre-visit chart review and post-visit care and documentation was 42 minutes. Time was spent on: Food choices and timing of food intake reviewed today. I discussed a personalized meal plan with the patient that will help her to lose weight and will improve her obesity-related conditions going forward. I performed a medically necessary appropriate examination and/or evaluation. I discussed the assessment and treatment plan with the patient. Motivational interviewing as well as evidence-based interventions for health behavior change were utilized today including the discussion of self monitoring techniques, problem-solving barriers and SMART goal setting techniques.  An exercise prescription was reviewed.  The patient was provided an opportunity to ask questions and all were answered. The patient agreed with the plan and demonstrated an understanding of the instructions. Clinical information was updated and documented in the EMR. I, Water quality scientist, CMA, am acting as transcriptionist for Briscoe Deutscher, DO  I have reviewed the above documentation for accuracy and completeness, and I agree with the above. -  Briscoe Deutscher, DO, MS, FAAFP, DABOM - Family and Bariatric Medicine.

## 2021-11-03 ENCOUNTER — Telehealth: Payer: Self-pay

## 2021-11-03 NOTE — Telephone Encounter (Signed)
Unable to reach the patient in multiple occasions. I mailed a ltter as a last attempt to reach the patient ?

## 2021-11-03 NOTE — Telephone Encounter (Signed)
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

## 2021-11-05 ENCOUNTER — Telehealth: Payer: Self-pay

## 2021-11-05 NOTE — Telephone Encounter (Signed)
Pt states she would like an alternative for lexapro because she has been taking it at night and waking up with headaches but last night she didn't fall asleep right away she got blurry vision and dizzy , patients states her eyes are still slightly blurry and wants recommendations on blurry vision as well ?

## 2021-11-05 NOTE — Telephone Encounter (Signed)
Pt notified and she will be keeping her appt for 11/26/21 ?

## 2021-11-22 ENCOUNTER — Telehealth: Payer: Self-pay | Admitting: Cardiology

## 2021-11-22 NOTE — Telephone Encounter (Signed)
Spoke with pt- per Dr. Wendy Poet note- Advised pt to follow up with her PCP for her symptoms. Pt agreed. ?

## 2021-11-22 NOTE — Telephone Encounter (Signed)
? ?  Pt is returning call, pt said, If she unable to answer phone call to leave her a detailed message  ?

## 2021-11-23 ENCOUNTER — Telehealth (INDEPENDENT_AMBULATORY_CARE_PROVIDER_SITE_OTHER): Payer: Self-pay | Admitting: Family Medicine

## 2021-11-23 ENCOUNTER — Encounter (INDEPENDENT_AMBULATORY_CARE_PROVIDER_SITE_OTHER): Payer: Self-pay | Admitting: Family Medicine

## 2021-11-23 ENCOUNTER — Telehealth (INDEPENDENT_AMBULATORY_CARE_PROVIDER_SITE_OTHER): Payer: 59 | Admitting: Family Medicine

## 2021-11-23 ENCOUNTER — Encounter (INDEPENDENT_AMBULATORY_CARE_PROVIDER_SITE_OTHER): Payer: Self-pay

## 2021-11-23 DIAGNOSIS — E8881 Metabolic syndrome: Secondary | ICD-10-CM

## 2021-11-23 DIAGNOSIS — E669 Obesity, unspecified: Secondary | ICD-10-CM

## 2021-11-23 DIAGNOSIS — F4323 Adjustment disorder with mixed anxiety and depressed mood: Secondary | ICD-10-CM | POA: Diagnosis not present

## 2021-11-23 DIAGNOSIS — E88819 Insulin resistance, unspecified: Secondary | ICD-10-CM

## 2021-11-23 DIAGNOSIS — K5909 Other constipation: Secondary | ICD-10-CM | POA: Diagnosis not present

## 2021-11-23 DIAGNOSIS — Z6836 Body mass index (BMI) 36.0-36.9, adult: Secondary | ICD-10-CM

## 2021-11-23 MED ORDER — WEGOVY 0.25 MG/0.5ML ~~LOC~~ SOAJ: 0.2500 mg | SUBCUTANEOUS | 0 refills | Status: DC

## 2021-11-23 NOTE — Telephone Encounter (Signed)
Prior authorization denied for [facility]. Per insurance: excluded from plan/not a covered benefit. Patient sent denial message via mychart. ?

## 2021-11-23 NOTE — Progress Notes (Signed)
TeleHealth Visit:  Due to the COVID-19 pandemic, this visit was completed with telemedicine (audio/video) technology to reduce patient and provider exposure as well as to preserve personal protective equipment.   Kaitlyn Morgan has verbally consented to this TeleHealth visit. The patient is located at home, the provider is located at home. The participants in this visit include the listed provider and patient. The visit was conducted today via MyChart video.  OBESITY Kaitlyn Morgan is here to discuss her progress with her obesity treatment plan along with follow-up of her obesity related diagnoses.   Today's visit was #: 21 Starting weight: 210 lbs Starting date: 11/01/2019 Today's date: 11/23/2021  Does not weight at home.  Interim History: Ready for a change. Spoke with her sister - on Mounjaro and BMI down 40 - 30. Kaitlyn Morgan has a history of chronic constipation, abdominal discomfort, reflux. We discussed risks v benefits of the medication at length and she would like to try it. Will see if Wegovy covered by her plan.  Nutrition Plan: the Category 1 Plan for 0% of the time.  Hunger is poorly controlled. Cravings are poorly controlled.  Activity: None at this time. Stress: stable.  Assessment/Plan:   Diagnoses and all orders for this visit:  Insulin resistance with polyphagia Improving, but not optimized. Goal is HgbA1c < 5.7, fasting insulin closer to 5.    Plan:  She will continue to focus on protein-rich, low simple carbohydrate foods. We reviewed the importance of hydration, regular exercise for stress reduction, and restorative sleep.   Lab Results  Component Value Date   HGBA1C 4.9 05/18/2020   Lab Results  Component Value Date   INSULIN 7.5 05/18/2020   INSULIN 14.7 10/29/2019   Situational mixed anxiety and depressive disorder Provided psychoeducation and facilitated discussion regarding eating as habit, self-monitoring, stimulus control, regulating eating pattern, coping  skills, and barriers to success.  Discussed cues and consequences, how thoughts affect eating, model of thoughts, feelings, and behaviors, and strategies for change by focusing on the cue. Discussed cognitive distortions, coping thoughts, and how to change your thoughts.  Chronic constipation Kaitlyn Morgan has a complicated bowel history, s/p bowel cancer and treatment. Previous surgeries and scans discussed. She is at risk for development of worsened constipation. We discussed this at length. Risk versus benefits of Wegovy reviewed. The patient understands monitoring parameters and red flags.   Obesity with current BMI of 36.3 -     Semaglutide-Weight Management (WEGOVY) 0.25 MG/0.5ML SOAJ; Inject 0.25 mg into the skin once a week.  Kaitlyn Morgan is currently in the action stage of change. As such, her goal is to continue with weight loss efforts. She has agreed to practicing portion control and making smarter food choices, such as increasing vegetables and decreasing simple carbohydrates.   Exercise goals: For substantial health benefits, adults should do at least 150 minutes (2 hours and 30 minutes) a week of moderate-intensity, or 75 minutes (1 hour and 15 minutes) a week of vigorous-intensity aerobic physical activity, or an equivalent combination of moderate- and vigorous-intensity aerobic activity. Aerobic activity should be performed in episodes of at least 10 minutes, and preferably, it should be spread throughout the week.  Behavioral modification strategies: increasing lean protein intake, increasing water intake, and increasing high fiber foods.  Kaitlyn Morgan has agreed to follow-up with our clinic in 4 weeks. She was informed of the importance of frequent follow-up visits to maximize her success with intensive lifestyle modifications for her multiple health conditions.  Objective:   VITALS:  Per patient if applicable, see vitals. GENERAL: Alert and in no acute distress. CARDIOPULMONARY: No increased  WOB. Speaking in clear sentences.  PSYCH: Pleasant and cooperative. Speech normal rate and rhythm. Affect is appropriate. Insight and judgement are appropriate. Attention is focused, linear, and appropriate.  NEURO: Oriented as arrived to appointment on time with no prompting.   Lab Results  Component Value Date   CREATININE 0.88 10/15/2021   BUN 15 10/15/2021   NA 140 10/15/2021   K 4.0 10/15/2021   CL 100 10/15/2021   CO2 30 10/15/2021   Lab Results  Component Value Date   ALT 9 10/15/2021   AST 15 10/15/2021   ALKPHOS 74 10/15/2021   BILITOT 0.9 10/15/2021   Lab Results  Component Value Date   HGBA1C 4.9 05/18/2020   HGBA1C 5.1 10/29/2019   HGBA1C 5.0 03/14/2016   Lab Results  Component Value Date   INSULIN 7.5 05/18/2020   INSULIN 14.7 10/29/2019   Lab Results  Component Value Date   TSH 3.51 10/15/2021   Lab Results  Component Value Date   CHOL 187 05/18/2020   HDL 59 05/18/2020   LDLCALC 117 (H) 05/18/2020   TRIG 60 05/18/2020   CHOLHDL 3.6 10/29/2019   Lab Results  Component Value Date   WBC 4.7 10/15/2021   HGB 14.1 10/15/2021   HCT 41.2 10/15/2021   MCV 88.0 10/15/2021   PLT 277.0 10/15/2021   Lab Results  Component Value Date   IRON 59 05/18/2020   TIBC 362 05/18/2020   FERRITIN 70 05/18/2020   Attestation Statements:   Reviewed by clinician on day of visit: allergies, medications, problem list, medical history, surgical history, family history, social history, and previous encounter notes.

## 2021-11-26 ENCOUNTER — Telehealth (INDEPENDENT_AMBULATORY_CARE_PROVIDER_SITE_OTHER): Payer: 59 | Admitting: Family Medicine

## 2021-11-26 ENCOUNTER — Encounter: Payer: Self-pay | Admitting: Family Medicine

## 2021-11-26 DIAGNOSIS — F419 Anxiety disorder, unspecified: Secondary | ICD-10-CM

## 2021-11-26 NOTE — Progress Notes (Signed)
? ? ?MyChart Video Visit ? ? ? ?Virtual Visit via Video Note  ? ?This visit type was conducted due to national recommendations for restrictions regarding the COVID-19 Pandemic (e.g. social distancing) in an effort to limit this patient's exposure and mitigate transmission in our community. This patient is at least at moderate risk for complications without adequate follow up. This format is felt to be most appropriate for this patient at this time. Physical exam was limited by quality of the video and audio technology used for the visit. Esau Grew. was able to get the patient set up on a video visit. ? ?Patient location: Car Patient and provider in visit ?Provider location: Office ? ?I discussed the limitations of evaluation and management by telemedicine and the availability of in person appointments. The patient expressed understanding and agreed to proceed. ? ?Visit Date: 11/26/2021 ? ?Today's healthcare provider: [name], DO  ? ?Subjective:  ? ? Patient ID: Kaitlyn Morgan, female    DOB: 01-Jun-1966, 56 y.o.   MRN: [id number] ? ?No chief complaint on file. ? ? ?HPI ?Patient is in today for a video visit and follow-up. ? ?She reports she has not been taking the xanax because she was feeling better and had been in a better mood. She has been getting more active and getting out of the house more. No recent panic attacks. ? ?She reports she has been having chest pain localized to the middle of the chest. Notes it is a "pinchy" feeling but sometimes it radiates to her back and her sides. Adds she is having problems sleeping and is always uncomfortable. She thinks it is due to her weight gain. She has a mammogram next week.  ? ?She notes she is still having hot flashes and would like to restart estradiol patches. ? ?Past Medical History:  ?Diagnosis Date  ? Anemia   ? during pregnancy, not now.  ? Arthritis   ? osteoarthritis-neck"some issues with cervical vertebrae"  ? Asthma   ? Atypical syncope   ? 1  episode -never had follow up with cardiology-never any further episodes, did go to urgent care.  ? Back pain   ? Colon cancer ([place])   ? rectal cancer Radiation and oral chemo with radiation.  ? Complication of anesthesia   ? Constipation   ? Edema, lower extremity   ? Family history of breast cancer   ? Family history of uterine cancer   ? Genital lesion, female   ? at times  ? Heart burn   ? occ.  ? Hx of seasonal allergies   ? Joint pain   ? Motion sickness   ? PONV (postoperative nausea and vomiting)   ? Rectal cancer ([place])   ? Shortness of breath   ? Swallowing difficulty   ? Umbilicus discharge   ? ? ?Past Surgical History:  ?Procedure Laterality Date  ? ABDOMINAL HYSTERECTOMY    ? atypical cell, hx abnormal pap test  ? CESAREAN SECTION    ? COLON SURGERY    ? DIAGNOSTIC LAPAROSCOPY    ? with D and C and Ovarian cystectomy, tubal ligation done at this time '00  ? DILATION AND CURETTAGE OF UTERUS    ? TUBAL LIGATION    ? XI ROBOTIC ASSISTED LOWER ANTERIOR RESECTION N/A 03/21/2016  ? Procedure: XI ROBOTIC ASSISTED LOWER ANTERIOR RESECTION;  Surgeon: Leighton Ruff, MD;  Location: [facility];  Service: General;  Laterality: N/A;  ? ? ?Family History  ?Problem Relation Age  of Onset  ? Breast cancer Maternal Grandmother   ?     dx in her 62s  ? Cancer Paternal Grandmother 59  ?     uterine cancer   ? Hypertension Father   ? COPD Father   ? Alcoholism Father   ? Hypertension Sister   ? Hypertension Paternal Grandfather   ? Heart attack Paternal Grandfather   ? Hypertension Sister   ? Supraventricular tachycardia Mother   ? Skin cancer Cousin   ?     maternal first cousin  ? ? ?Social History  ? ?Socioeconomic History  ? Marital status: Legally Separated  ?  Spouse name: Not on file  ? Number of children: 3  ? Years of education: Not on file  ? Highest education level: Not on file  ?Occupational History  ? Occupation: Education officer, community  ?Tobacco Use  ? Smoking status: Never  ?  Passive exposure: Never  ? Smokeless  tobacco: Never  ?Vaping Use  ? Vaping Use: Never used  ?Substance and Sexual Activity  ? Alcohol use: No  ?  Alcohol/week: 0.0 standard drinks  ? Drug use: No  ? Sexual activity: Not on file  ?Other Topics Concern  ? Not on file  ?Social History Narrative  ? Not on file  ? ?Social Determinants of Health  ? ?Financial Resource Strain: Not on file  ?Food Insecurity: Not on file  ?Transportation Needs: Not on file  ?Physical Activity: Not on file  ?Stress: Not on file  ?Social Connections: Not on file  ?Intimate Partner Violence: Not on file  ? ? ?Outpatient Medications Prior to Visit  ?Medication Sig Dispense Refill  ? ALPRAZolam (XANAX) 0.25 MG tablet Take 1 tablet (0.25 mg total) by mouth 2 (two) times daily as needed for anxiety. 20 tablet 0  ? aspirin EC 81 MG tablet Take 1 tablet (81 mg total) by mouth daily. Swallow whole. 90 tablet 3  ? hydrochlorothiazide (HYDRODIURIL) 25 MG tablet TAKE 1 TABLET(25 MG) BY MOUTH DAILY 90 tablet 3  ? OMEPRAZOLE PO Take by mouth.    ? Semaglutide-Weight Management (WEGOVY) 0.25 MG/0.5ML SOAJ Inject 0.25 mg into the skin once a week. 2 mL 0  ? ?No facility-administered medications prior to visit.  ? ? ?Allergies  ?Allergen Reactions  ? Codeine Nausea Only  ? Morphine And Related Itching and Nausea Only  ? Sucralose Nausea And Vomiting  ?  Fever  ? Zolpidem Tartrate   ?  Hallucinations ?  ? Zolpidem Tartrate   ?  Other reaction(s): Other (See Comments) ?Hallucinations ?Hallucinations  ? ? ?Review of Systems  ?Constitutional:  Negative for fever.  ?     (+) hot flashes   ?HENT:  Negative for congestion.   ?Respiratory:  Negative for cough.   ?Cardiovascular:  Positive for chest pain.  ?Gastrointestinal:  Negative for nausea.  ?Genitourinary:  Negative for dysuria.  ?Musculoskeletal:  Positive for back pain. Negative for myalgias.  ?Skin:  Negative for rash.  ?Neurological:  Negative for dizziness.  ?Psychiatric/Behavioral:  Negative for depression.   ? ?   ?Objective:  ?  ?Physical  Exam ?Constitutional:   ?   Appearance: Normal appearance. She is not ill-appearing or toxic-appearing.  ?Neurological:  ?   Mental Status: She is alert and oriented to person, place, and time.  ?Psychiatric:     ?   Mood and Affect: Mood normal.     ?   Behavior: Behavior normal.  ? ? ?There were  no vitals taken for this visit. ?Wt Readings from Last 3 Encounters:  ?10/25/21 198 lb (89.8 kg)  ?10/15/21 201 lb (91.2 kg)  ?10/05/21 201 lb 1.3 oz (91.2 kg)  ? ? ?Diabetic Foot Exam - Simple   ?No data filed ?  ? ?Lab Results  ?Component Value Date  ? WBC 4.7 10/15/2021  ? HGB 14.1 10/15/2021  ? HCT 41.2 10/15/2021  ? PLT 277.0 10/15/2021  ? GLUCOSE 74 10/15/2021  ? CHOL 187 05/18/2020  ? TRIG 60 05/18/2020  ? HDL 59 05/18/2020  ? LDLCALC 117 (H) 05/18/2020  ? ALT 9 10/15/2021  ? AST 15 10/15/2021  ? NA 140 10/15/2021  ? K 4.0 10/15/2021  ? CL 100 10/15/2021  ? CREATININE 0.88 10/15/2021  ? BUN 15 10/15/2021  ? CO2 30 10/15/2021  ? TSH 3.51 10/15/2021  ? HGBA1C 4.9 05/18/2020  ? ? ?Lab Results  ?Component Value Date  ? TSH 3.51 10/15/2021  ? ?Lab Results  ?Component Value Date  ? WBC 4.7 10/15/2021  ? HGB 14.1 10/15/2021  ? HCT 41.2 10/15/2021  ? MCV 88.0 10/15/2021  ? PLT 277.0 10/15/2021  ? ?Lab Results  ?Component Value Date  ? NA 140 10/15/2021  ? K 4.0 10/15/2021  ? CHLORIDE 106 04/19/2017  ? CO2 30 10/15/2021  ? GLUCOSE 74 10/15/2021  ? BUN 15 10/15/2021  ? CREATININE 0.88 10/15/2021  ? BILITOT 0.9 10/15/2021  ? ALKPHOS 74 10/15/2021  ? AST 15 10/15/2021  ? ALT 9 10/15/2021  ? PROT 7.6 10/15/2021  ? ALBUMIN 4.7 10/15/2021  ? CALCIUM 9.8 10/15/2021  ? ANIONGAP 11 02/05/2021  ? EGFR 82 (L) 04/19/2017  ? GFR 74.55 10/15/2021  ? ?Lab Results  ?Component Value Date  ? CHOL 187 05/18/2020  ? ?Lab Results  ?Component Value Date  ? HDL 59 05/18/2020  ? ?Lab Results  ?Component Value Date  ? LDLCALC 117 (H) 05/18/2020  ? ?Lab Results  ?Component Value Date  ? TRIG 60 05/18/2020  ? ?Lab Results  ?Component Value Date  ?  CHOLHDL 3.6 10/29/2019  ? ?Lab Results  ?Component Value Date  ? HGBA1C 4.9 05/18/2020  ? ? ?   ?Assessment & Plan:  ? ?Problem List Items Addressed This Visit   ? ?  ? Unprioritized  ? Anxiety  ?  Pt states it

## 2021-11-26 NOTE — Assessment & Plan Note (Signed)
Pt states it has improved  ?She is not taking the medication ?F/u prn ?

## 2021-12-16 ENCOUNTER — Other Ambulatory Visit: Payer: Self-pay | Admitting: Family Medicine

## 2021-12-16 DIAGNOSIS — R600 Localized edema: Secondary | ICD-10-CM

## 2021-12-24 ENCOUNTER — Encounter: Payer: 59 | Admitting: Family Medicine

## 2022-01-06 ENCOUNTER — Ambulatory Visit: Payer: 59 | Admitting: Cardiology

## 2022-01-17 LAB — RESULTS CONSOLE HPV: CHL HPV: NEGATIVE

## 2022-01-17 LAB — HM PAP SMEAR

## 2022-02-04 ENCOUNTER — Ambulatory Visit: Payer: 59 | Admitting: Psychology

## 2022-03-01 ENCOUNTER — Encounter: Payer: Self-pay | Admitting: Family Medicine

## 2022-03-01 ENCOUNTER — Ambulatory Visit (INDEPENDENT_AMBULATORY_CARE_PROVIDER_SITE_OTHER): Payer: 59 | Admitting: Family Medicine

## 2022-03-01 VITALS — BP 110/78 | HR 84 | Temp 97.6°F | Resp 18 | Ht 62.0 in | Wt 202.8 lb

## 2022-03-01 DIAGNOSIS — C189 Malignant neoplasm of colon, unspecified: Secondary | ICD-10-CM

## 2022-03-01 DIAGNOSIS — E8881 Metabolic syndrome: Secondary | ICD-10-CM | POA: Diagnosis not present

## 2022-03-01 DIAGNOSIS — E559 Vitamin D deficiency, unspecified: Secondary | ICD-10-CM

## 2022-03-01 DIAGNOSIS — Z6838 Body mass index (BMI) 38.0-38.9, adult: Secondary | ICD-10-CM

## 2022-03-01 DIAGNOSIS — R600 Localized edema: Secondary | ICD-10-CM | POA: Diagnosis not present

## 2022-03-01 DIAGNOSIS — Z23 Encounter for immunization: Secondary | ICD-10-CM

## 2022-03-01 DIAGNOSIS — F4323 Adjustment disorder with mixed anxiety and depressed mood: Secondary | ICD-10-CM | POA: Diagnosis not present

## 2022-03-01 DIAGNOSIS — Z0000 Encounter for general adult medical examination without abnormal findings: Secondary | ICD-10-CM | POA: Diagnosis not present

## 2022-03-01 DIAGNOSIS — F419 Anxiety disorder, unspecified: Secondary | ICD-10-CM

## 2022-03-01 DIAGNOSIS — J4520 Mild intermittent asthma, uncomplicated: Secondary | ICD-10-CM

## 2022-03-01 NOTE — Patient Instructions (Signed)
Preventive Care 40-55 Years Old, Female Preventive care refers to lifestyle choices and visits with your health care provider that can promote health and wellness. Preventive care visits are also called wellness exams. What can I expect for my preventive care visit? Counseling Your health care provider may ask you questions about your: Medical history, including: Past medical problems. Family medical history. Pregnancy history. Current health, including: Menstrual cycle. Method of birth control. Emotional well-being. Home life and relationship well-being. Sexual activity and sexual health. Lifestyle, including: Alcohol, nicotine or tobacco, and drug use. Access to firearms. Diet, exercise, and sleep habits. Work and work environment. Sunscreen use. Safety issues such as seatbelt and bike helmet use. Physical exam Your health care provider will check your: Height and weight. These may be used to calculate your BMI (body mass index). BMI is a measurement that tells if you are at a healthy weight. Waist circumference. This measures the distance around your waistline. This measurement also tells if you are at a healthy weight and may help predict your risk of certain diseases, such as type 2 diabetes and high blood pressure. Heart rate and blood pressure. Body temperature. Skin for abnormal spots. What immunizations do I need?  Vaccines are usually given at various ages, according to a schedule. Your health care provider will recommend vaccines for you based on your age, medical history, and lifestyle or other factors, such as travel or where you work. What tests do I need? Screening Your health care provider may recommend screening tests for certain conditions. This may include: Lipid and cholesterol levels. Diabetes screening. This is done by checking your blood sugar (glucose) after you have not eaten for a while (fasting). Pelvic exam and Pap test. Hepatitis B test. Hepatitis C  test. HIV (human immunodeficiency virus) test. STI (sexually transmitted infection) testing, if you are at risk. Lung cancer screening. Colorectal cancer screening. Mammogram. Talk with your health care provider about when you should start having regular mammograms. This may depend on whether you have a family history of breast cancer. BRCA-related cancer screening. This may be done if you have a family history of breast, ovarian, tubal, or peritoneal cancers. Bone density scan. This is done to screen for osteoporosis. Talk with your health care provider about your test results, treatment options, and if necessary, the need for more tests. Follow these instructions at home: Eating and drinking  Eat a diet that includes fresh fruits and vegetables, whole grains, lean protein, and low-fat dairy products. Take vitamin and mineral supplements as recommended by your health care provider. Do not drink alcohol if: Your health care provider tells you not to drink. You are pregnant, may be pregnant, or are planning to become pregnant. If you drink alcohol: Limit how much you have to 0-1 drink a day. Know how much alcohol is in your drink. In the [place] one drink equals one 12 oz bottle of beer (355 mL), one 5 oz glass of wine (148 mL), or one 1 oz glass of hard liquor (44 mL). Lifestyle Brush your teeth every morning and night with fluoride toothpaste. Floss one time each day. Exercise for at least 30 minutes 5 or more days each week. Do not use any products that contain nicotine or tobacco. These products include cigarettes, chewing tobacco, and vaping devices, such as e-cigarettes. If you need help quitting, ask your health care provider. Do not use drugs. If you are sexually active, practice safe sex. Use a condom or other form of protection to   prevent STIs. If you do not wish to become pregnant, use a form of birth control. If you plan to become pregnant, see your health care provider for a  prepregnancy visit. Take aspirin only as told by your health care provider. Make sure that you understand how much to take and what form to take. Work with your health care provider to find out whether it is safe and beneficial for you to take aspirin daily. Find healthy ways to manage stress, such as: Meditation, yoga, or listening to music. Journaling. Talking to a trusted person. Spending time with friends and family. Minimize exposure to UV radiation to reduce your risk of skin cancer. Safety Always wear your seat belt while driving or riding in a vehicle. Do not drive: If you have been drinking alcohol. Do not ride with someone who has been drinking. When you are tired or distracted. While texting. If you have been using any mind-altering substances or drugs. Wear a helmet and other protective equipment during sports activities. If you have firearms in your house, make sure you follow all gun safety procedures. Seek help if you have been physically or sexually abused. What's next? Visit your health care provider once a year for an annual wellness visit. Ask your health care provider how often you should have your eyes and teeth checked. Stay up to date on all vaccines. This information is not intended to replace advice given to you by your health care provider. Make sure you discuss any questions you have with your health care provider. Document Revised: 01/27/2021 Document Reviewed: 01/27/2021 Elsevier Patient Education  [place].

## 2022-03-01 NOTE — Progress Notes (Signed)
Subjective:   By signing my name below, I, Kaitlyn Morgan, attest that this documentation has been prepared under the direction and in the presence of [name], DO. 03/01/2022      Patient ID: Kaitlyn Morgan, female    DOB: 1966-02-18, 56 y.o.   MRN: [id number]  Chief Complaint  Patient presents with   Annual Exam    Pt states fasting     HPI Patient is in today for a comprehensive physical exam.   She does not have follow up appointments to manage her rectal cancer. She reports its been 6 years since her last treatment.  She has recently seen her GYN specialist.  She has tried taking phentermine but reports feeling fatigue while taking it.  She continues taking wegovy and reports no new issues while taking it.  She denies having any fever, new muscle pain, new joint pain, new moles, congestion, sinus pain, sore throat, chest pain, palpations, cough, SOB, wheezing, n/v/d, constipation, blood in stool, dysuria, frequency, hematuria, or headaches at this time. She has no new changes to her family medical history. Her mother has passed away 5 years ago from [place] at age 70. She has 1 daughter and 2 twin sons. She is not sexually active at this time. She is currently single.  She has no Covid-19 vaccines. She does not remember her last tetanus vaccine but is interested in receiving it if she is due. She is not interested in receiving the shingles vaccines.  She does not exercise regularly at this time.  She is improving her diet by increasing her protein intake.    Past Medical History:  Diagnosis Date   Anemia    during pregnancy, not now.   Arthritis    osteoarthritis-neck"some issues with cervical vertebrae"   Asthma    Atypical syncope    1 episode -never had follow up with cardiology-never any further episodes, did go to urgent care.   Back pain    Colon cancer ([place])    rectal cancer Radiation and oral chemo with radiation.   Complication of anesthesia     Constipation    Edema, lower extremity    Family history of breast cancer    Family history of uterine cancer    Genital lesion, female    at times   Heart burn    occ.   Hx of seasonal allergies    Joint pain    Motion sickness    PONV (postoperative nausea and vomiting)    Rectal cancer ([facility])    Shortness of breath    Swallowing difficulty    Umbilicus discharge     Past Surgical History:  Procedure Laterality Date   ABDOMINAL HYSTERECTOMY     atypical cell, hx abnormal pap test   CESAREAN SECTION     COLON SURGERY     DIAGNOSTIC LAPAROSCOPY     with D and C and Ovarian cystectomy, tubal ligation done at this time '00   [place]     XI ROBOTIC ASSISTED LOWER ANTERIOR RESECTION N/A 03/21/2016   Procedure: XI ROBOTIC ASSISTED LOWER ANTERIOR RESECTION;  Surgeon: Leighton Ruff, MD;  Location: [facility];  Service: General;  Laterality: N/A;    Family History  Problem Relation Age of Onset   Breast cancer Maternal Grandmother        dx in her 49s   Cancer Paternal Grandmother 50  uterine cancer    Hypertension Father    COPD Father    Alcoholism Father    Hypertension Sister    Hypertension Paternal Grandfather    Heart attack Paternal Grandfather    Hypertension Sister    Supraventricular tachycardia Mother    Skin cancer Cousin        maternal first cousin    Social History   Socioeconomic History   Marital status: Legally Separated    Spouse name: Not on file   Number of children: 3   Years of education: Not on file   Highest education level: Not on file  Occupational History   Occupation: Education officer, community  Tobacco Use   Smoking status: Never    Passive exposure: Never   Smokeless tobacco: Never  Vaping Use   Vaping Use: Never used  Substance and Sexual Activity   Alcohol use: No    Alcohol/week: 0.0 standard drinks of alcohol   Drug use: No   Sexual activity: Not on file  Other Topics Concern   Not  on file  Social History Narrative   Not on file   Social Determinants of Health   Financial Resource Strain: Not on file  Food Insecurity: Not on file  Transportation Needs: Not on file  Physical Activity: Not on file  Stress: Not on file  Social Connections: Not on file  Intimate Partner Violence: Not on file    Outpatient Medications Prior to Visit  Medication Sig Dispense Refill   ALPRAZolam (XANAX) 0.25 MG tablet Take 1 tablet (0.25 mg total) by mouth 2 (two) times daily as needed for anxiety. 20 tablet 0   aspirin EC 81 MG tablet Take 1 tablet (81 mg total) by mouth daily. Swallow whole. 90 tablet 3   estradiol (VIVELLE-DOT) 0.1 MG/24HR patch APPLY 1 PATCH TOPICALLY TO THE SKIN 2 TIMES A WEEK     hydrochlorothiazide (HYDRODIURIL) 25 MG tablet TAKE 1 TABLET(25 MG) BY MOUTH DAILY 90 tablet 3   OMEPRAZOLE PO Take by mouth.     phentermine (ADIPEX-P) 37.5 MG tablet Take 18.75-37.5 mg by mouth daily.     topiramate (TOPAMAX) 25 MG tablet Take 25 mg by mouth daily.     Semaglutide-Weight Management (WEGOVY) 0.25 MG/0.5ML SOAJ Inject 0.25 mg into the skin once a week. (Patient not taking: Reported on 03/01/2022) 2 mL 0   No facility-administered medications prior to visit.    Allergies  Allergen Reactions   Codeine Nausea Only   Morphine And Related Itching and Nausea Only   Sucralose Nausea And Vomiting    Fever   Zolpidem Tartrate     Hallucinations    Zolpidem Tartrate     Other reaction(s): Other (See Comments) Hallucinations Hallucinations    Review of Systems  Constitutional:  Negative for fever.  HENT:  Negative for congestion, sinus pain and sore throat.   Respiratory:  Negative for cough, shortness of breath and wheezing.   Cardiovascular:  Negative for chest pain and palpitations.  Gastrointestinal:  Negative for abdominal pain, constipation, diarrhea, nausea and vomiting.  Genitourinary:  Negative for dysuria, frequency and hematuria.  Musculoskeletal:         (-)new muscle pain (-)new joint pain  Skin:        (-)New moles  Neurological:  Negative for headaches.       Objective:    Physical Exam Constitutional:      General: She is not in acute distress.    Appearance: Normal appearance. She  is not ill-appearing.  HENT:     Head: Normocephalic and atraumatic.     Right Ear: Tympanic membrane, ear canal and external ear normal.     Left Ear: Tympanic membrane, ear canal and external ear normal.  Eyes:     Extraocular Movements: Extraocular movements intact.     Pupils: Pupils are equal, round, and reactive to light.  Cardiovascular:     Rate and Rhythm: Normal rate and regular rhythm.     Heart sounds: Normal heart sounds. No murmur heard.    No gallop.  Pulmonary:     Effort: Pulmonary effort is normal. No respiratory distress.     Breath sounds: Normal breath sounds. No wheezing or rales.  Abdominal:     General: Bowel sounds are normal. There is no distension.     Palpations: Abdomen is soft.     Tenderness: There is no abdominal tenderness. There is no guarding.  Skin:    General: Skin is warm and dry.  Neurological:     Mental Status: She is alert and oriented to person, place, and time.  Psychiatric:        Judgment: Judgment normal.     BP 110/78 (BP Location: Left Arm, Patient Position: Sitting, Cuff Size: Large)   Pulse 84   Temp 97.6 F (36.4 C) (Oral)   Resp 18   Ht _0  (1.575 m)   Wt 202 lb 12.8 oz (92 kg)   SpO2 98%   BMI 37.09 kg/m  Wt Readings from Last 3 Encounters:  03/01/22 202 lb 12.8 oz (92 kg)  10/25/21 198 lb (89.8 kg)  10/15/21 201 lb (91.2 kg)    Diabetic Foot Exam - Simple   No data filed    Lab Results  Component Value Date   WBC 4.7 10/15/2021   HGB 14.1 10/15/2021   HCT 41.2 10/15/2021   PLT 277.0 10/15/2021   GLUCOSE 74 10/15/2021   CHOL 187 05/18/2020   TRIG 60 05/18/2020   HDL 59 05/18/2020   LDLCALC 117 (H) 05/18/2020   ALT 9 10/15/2021   AST 15 10/15/2021   NA 140  10/15/2021   K 4.0 10/15/2021   CL 100 10/15/2021   CREATININE 0.88 10/15/2021   BUN 15 10/15/2021   CO2 30 10/15/2021   TSH 3.51 10/15/2021   HGBA1C 4.9 05/18/2020    Lab Results  Component Value Date   TSH 3.51 10/15/2021   Lab Results  Component Value Date   WBC 4.7 10/15/2021   HGB 14.1 10/15/2021   HCT 41.2 10/15/2021   MCV 88.0 10/15/2021   PLT 277.0 10/15/2021   Lab Results  Component Value Date   NA 140 10/15/2021   K 4.0 10/15/2021   CHLORIDE 106 04/19/2017   CO2 30 10/15/2021   GLUCOSE 74 10/15/2021   BUN 15 10/15/2021   CREATININE 0.88 10/15/2021   BILITOT 0.9 10/15/2021   ALKPHOS 74 10/15/2021   AST 15 10/15/2021   ALT 9 10/15/2021   PROT 7.6 10/15/2021   ALBUMIN 4.7 10/15/2021   CALCIUM 9.8 10/15/2021   ANIONGAP 11 02/05/2021   EGFR 82 (L) 04/19/2017   GFR 74.55 10/15/2021   Lab Results  Component Value Date   CHOL 187 05/18/2020   Lab Results  Component Value Date   HDL 59 05/18/2020   Lab Results  Component Value Date   LDLCALC 117 (H) 05/18/2020   Lab Results  Component Value Date   TRIG 60 05/18/2020  Lab Results  Component Value Date   CHOLHDL 3.6 10/29/2019   Lab Results  Component Value Date   HGBA1C 4.9 05/18/2020       Assessment & Plan:   Problem List Items Addressed This Visit   None    No orders of the defined types were placed in this encounter.   I, Kaitlyn [place], personally preformed the services described in this documentation.  All medical record entries made by the scribe were at my direction and in my presence.  I have reviewed the chart and discharge instructions (if applicable) and agree that the record reflects my personal performance and is accurate and complete. 03/01/2022   I,Kaitlyn Morgan,acting as a Education administrator for [employer], DO.,have documented all relevant documentation on the behalf of [name], DO,as directed by  [name], DO while in the presence of [name], DO.   Kaitlyn [employer]

## 2022-03-02 LAB — LIPID PANEL
Cholesterol: 155 mg/dL (ref 0–200)
HDL: 54.3 mg/dL (ref >39.00)
LDL Cholesterol: 88 mg/dL (ref 0–99)
NonHDL: 100.69
Total CHOL/HDL Ratio: 3
Triglycerides: 64 mg/dL (ref 0.0–149.0)
VLDL: 12.8 mg/dL (ref 0.0–40.0)

## 2022-03-02 LAB — CBC WITH DIFFERENTIAL/PLATELET
Basophils Absolute: 0.1 10*3/uL (ref 0.0–0.1)
Basophils Relative: 1.2 % (ref 0.0–3.0)
Eosinophils Absolute: 0.1 10*3/uL (ref 0.0–0.7)
Eosinophils Relative: 1 % (ref 0.0–5.0)
HCT: 40.3 % (ref 36.0–46.0)
Hemoglobin: 13.5 g/dL (ref 12.0–15.0)
Lymphocytes Relative: 23.1 % (ref 12.0–46.0)
Lymphs Abs: 1.3 10*3/uL (ref 0.7–4.0)
MCHC: 33.4 g/dL (ref 30.0–36.0)
MCV: 89.7 fl (ref 78.0–100.0)
Monocytes Absolute: 0.3 10*3/uL (ref 0.1–1.0)
Monocytes Relative: 6.3 % (ref 3.0–12.0)
Neutro Abs: 3.7 10*3/uL (ref 1.4–7.7)
Neutrophils Relative %: 68.4 % (ref 43.0–77.0)
Platelets: 282 10*3/uL (ref 150.0–400.0)
RBC: 4.49 Mil/uL (ref 3.87–5.11)
RDW: 14.4 % (ref 11.5–15.5)
WBC: 5.5 10*3/uL (ref 4.0–10.5)

## 2022-03-02 LAB — COMPREHENSIVE METABOLIC PANEL
ALT: 11 U/L (ref 0–35)
AST: 17 U/L (ref 0–37)
Albumin: 4.6 g/dL (ref 3.5–5.2)
Alkaline Phosphatase: 71 U/L (ref 39–117)
BUN: 13 mg/dL (ref 6–23)
CO2: 30 mEq/L (ref 19–32)
Calcium: 9.5 mg/dL (ref 8.4–10.5)
Chloride: 99 mEq/L (ref 96–112)
Creatinine, Ser: 0.82 mg/dL (ref 0.40–1.20)
GFR: 80.93 mL/min (ref >60.00)
Glucose, Bld: 78 mg/dL (ref 70–99)
Potassium: 4 mEq/L (ref 3.5–5.1)
Sodium: 139 mEq/L (ref 135–145)
Total Bilirubin: 1 mg/dL (ref 0.2–1.2)
Total Protein: 7.2 g/dL (ref 6.0–8.3)

## 2022-03-02 LAB — HEMOGLOBIN A1C: Hgb A1c MFr Bld: 5.2 % (ref 4.6–6.5)

## 2022-03-02 LAB — TSH: TSH: 4.82 u[IU]/mL (ref 0.35–5.50)

## 2022-03-02 LAB — VITAMIN D 25 HYDROXY (VIT D DEFICIENCY, FRACTURES): VITD: 30.9 ng/mL (ref 30.00–100.00)

## 2022-03-02 NOTE — Assessment & Plan Note (Signed)
Going to [employer]

## 2022-03-02 NOTE — Assessment & Plan Note (Signed)
Stable  Prn xanax

## 2022-03-02 NOTE — Assessment & Plan Note (Signed)
History of

## 2022-03-02 NOTE — Assessment & Plan Note (Signed)
Stable

## 2022-03-04 ENCOUNTER — Encounter: Payer: 59 | Admitting: Family Medicine

## 2022-03-23 ENCOUNTER — Encounter (INDEPENDENT_AMBULATORY_CARE_PROVIDER_SITE_OTHER): Payer: Self-pay

## 2022-08-05 ENCOUNTER — Telehealth (INDEPENDENT_AMBULATORY_CARE_PROVIDER_SITE_OTHER): Payer: 59 | Admitting: Family Medicine

## 2022-08-05 DIAGNOSIS — J40 Bronchitis, not specified as acute or chronic: Secondary | ICD-10-CM

## 2022-08-05 MED ORDER — PROMETHAZINE-DM 6.25-15 MG/5ML PO SYRP: 5.0000 mL | ORAL_SOLUTION | Freq: Four times a day (QID) | ORAL | 0 refills | Status: DC | PRN

## 2022-08-05 MED ORDER — AZITHROMYCIN 250 MG PO TABS: ORAL_TABLET | ORAL | 0 refills | Status: DC

## 2022-08-05 MED ORDER — PREDNISONE 10 MG PO TABS: ORAL_TABLET | ORAL | 0 refills | Status: DC

## 2022-08-05 NOTE — Progress Notes (Unsigned)
MyChart Video Visit    Virtual Visit via Video Note   This visit type was conducted due to national recommendations for restrictions regarding the COVID-19 Pandemic (e.g. social distancing) in an effort to limit this patient's exposure and mitigate transmission in our community. This patient is at least at moderate risk for complications without adequate follow up. This format is felt to be most appropriate for this patient at this time. Physical exam was limited by quality of the video and audio technology used for the visit. heather was able to get the patient set up on a video visit.  Patient location: home  Patient and provider in visit Provider location: Office  I discussed the limitations of evaluation and management by telemedicine and the availability of in person appointments. The patient expressed understanding and agreed to proceed.  Visit Date: 08/05/2022  Today's healthcare provider: [name], DO     Subjective:    Patient ID: Kaitlyn Morgan, female    DOB: December 03, 1965, 56 y.o.   MRN: [id number]  No chief complaint on file.   HPI Patient is in today for cough and congestion since Monday.   She had chills and bodyaches.   + cough and congestion productive.   + wheezing   Past Medical History:  Diagnosis Date   Anemia    during pregnancy, not now.   Arthritis    osteoarthritis-neck"some issues with cervical vertebrae"   Asthma    Atypical syncope    1 episode -never had follow up with cardiology-never any further episodes, did go to urgent care.   Back pain    Colon cancer ([place])    rectal cancer Radiation and oral chemo with radiation.   Complication of anesthesia    Constipation    Edema, lower extremity    Family history of breast cancer    Family history of uterine cancer    Genital lesion, female    at times   Heart burn    occ.   Hx of seasonal allergies    Joint pain    Motion sickness    PONV (postoperative nausea and vomiting)     Rectal cancer ([facility])    Shortness of breath    Swallowing difficulty    Umbilicus discharge     Past Surgical History:  Procedure Laterality Date   ABDOMINAL HYSTERECTOMY     atypical cell, hx abnormal pap test   CESAREAN SECTION     COLON SURGERY     DIAGNOSTIC LAPAROSCOPY     with D and C and Ovarian cystectomy, tubal ligation done at this time '00   DILATION AND CURETTAGE OF UTERUS     TUBAL LIGATION     XI ROBOTIC ASSISTED LOWER ANTERIOR RESECTION N/A 03/21/2016   Procedure: XI ROBOTIC ASSISTED LOWER ANTERIOR RESECTION;  Surgeon: Leighton Ruff, MD;  Location: [facility];  Service: General;  Laterality: N/A;    Family History  Problem Relation Age of Onset   Supraventricular tachycardia Mother    Alzheimer's disease Mother    Hypertension Father    COPD Father    Alcoholism Father    Hypertension Sister    Hypertension Sister    Breast cancer Maternal Grandmother        dx in her 8s   Cancer Paternal Grandmother 73       uterine cancer    Hypertension Paternal Grandfather    Heart attack Paternal Grandfather    Skin cancer Cousin  maternal first cousin    Social History   Socioeconomic History   Marital status: Legally Separated    Spouse name: Not on file   Number of children: 3   Years of education: Not on file   Highest education level: Not on file  Occupational History   Occupation: Education officer, community  Tobacco Use   Smoking status: Never    Passive exposure: Never   Smokeless tobacco: Never  Vaping Use   Vaping Use: Never used  Substance and Sexual Activity   Alcohol use: No    Alcohol/week: 0.0 standard drinks of alcohol   Drug use: No   Sexual activity: Not Currently    Birth control/protection: Abstinence  Other Topics Concern   Not on file  Social History Narrative   No exercise   Social Determinants of Health   Financial Resource Strain: Not on file  Food Insecurity: Not on file  Transportation Needs: Not on file  Physical  Activity: Not on file  Stress: Not on file  Social Connections: Not on file  Intimate Partner Violence: Not on file    Outpatient Medications Prior to Visit  Medication Sig Dispense Refill   ALPRAZolam (XANAX) 0.25 MG tablet Take 1 tablet (0.25 mg total) by mouth 2 (two) times daily as needed for anxiety. 20 tablet 0   aspirin EC 81 MG tablet Take 1 tablet (81 mg total) by mouth daily. Swallow whole. 90 tablet 3   estradiol (VIVELLE-DOT) 0.1 MG/24HR patch APPLY 1 PATCH TOPICALLY TO THE SKIN 2 TIMES A WEEK     hydrochlorothiazide (HYDRODIURIL) 25 MG tablet TAKE 1 TABLET(25 MG) BY MOUTH DAILY 90 tablet 3   OMEPRAZOLE PO Take by mouth.     phentermine (ADIPEX-P) 37.5 MG tablet Take 18.75-37.5 mg by mouth daily.     topiramate (TOPAMAX) 25 MG tablet Take 25 mg by mouth daily.     No facility-administered medications prior to visit.    Allergies  Allergen Reactions   Codeine Nausea Only   Morphine And Related Itching and Nausea Only   Sucralose Nausea And Vomiting    Fever   Zolpidem Tartrate     Hallucinations    Zolpidem Tartrate     Other reaction(s): Other (See Comments) Hallucinations Hallucinations    Review of Systems  Constitutional:  Negative for fever and malaise/fatigue.  HENT:  Negative for congestion.   Eyes:  Negative for blurred vision.  Respiratory:  Negative for shortness of breath.   Cardiovascular:  Negative for chest pain, palpitations and leg swelling.  Gastrointestinal:  Negative for abdominal pain, blood in stool and nausea.  Genitourinary:  Negative for dysuria and frequency.  Musculoskeletal:  Negative for falls.  Skin:  Negative for rash.  Neurological:  Negative for dizziness, loss of consciousness and headaches.  Endo/Heme/Allergies:  Negative for environmental allergies.  Psychiatric/Behavioral:  Negative for depression. The patient is not nervous/anxious.        Objective:    Physical Exam Vitals and nursing note reviewed.   Constitutional:      Appearance: She is well-developed. She is not ill-appearing.  Neck:     Thyroid: No thyromegaly.     Vascular: No JVD.  Pulmonary:     Effort: Pulmonary effort is normal.     Breath sounds: Wheezing present.  Neurological:     General: No focal deficit present.     Mental Status: She is alert and oriented to person, place, and time.     There were no  vitals taken for this visit. Wt Readings from Last 3 Encounters:  03/01/22 202 lb 12.8 oz (92 kg)  10/25/21 198 lb (89.8 kg)  10/15/21 201 lb (91.2 kg)       Assessment & Plan:  Bronchitis -     Azithromycin; As directed  Dispense: 6 each; Refill: 0 -     predniSONE; TAKE 3 TABLETS PO QD FOR 3 DAYS THEN TAKE 2 TABLETS PO QD FOR 3 DAYS THEN TAKE 1 TABLET PO QD FOR 3 DAYS THEN TAKE 1/2 TAB PO QD FOR 3 DAYS  Dispense: 20 tablet; Refill: 0 -     Promethazine-DM; Take 5 mLs by mouth 4 (four) times daily as needed.  Dispense: 118 mL; Refill: 0     I discussed the assessment and treatment plan with the patient. The patient was provided an opportunity to ask questions and all were answered. The patient agreed with the plan and demonstrated an understanding of the instructions.   The patient was advised to call back or seek an in-person evaluation if the symptoms worsen or if the condition fails to improve as anticipated.  [name], DO [place] [facility] [phone] (phone) [phone] (fax)  [place]

## 2022-08-05 NOTE — Patient Instructions (Signed)
Diarrhea, Adult Diarrhea is frequent loose and sometimes watery bowel movements. Diarrhea can make you feel weak and cause you to become dehydrated. Dehydration is a condition in which there is not enough water or other fluids in the body. Dehydration can make you tired and thirsty, cause you to have a dry mouth, and decrease how often you urinate. Diarrhea typically lasts 2-3 days. However, it can last longer if it is a sign of something more serious. It is important to treat your diarrhea as told by your health care provider. Follow these instructions at home: Eating and drinking     Follow these recommendations as told by your health care provider: Take an oral rehydration solution (ORS). This is an over-the-counter medicine that helps return your body to its normal balance of nutrients and water. It is found at pharmacies and retail stores. Drink enough fluid to keep your urine pale yellow. Drink fluids such as water, diluted fruit juice, and low-calorie sports drinks. You can drink milk also, if desired. Sucking on ice chips is another way to get fluids. Avoid drinking fluids that contain a lot of sugar or caffeine, such as soda, energy drinks, and regular sports drinks. Avoid alcohol. Eat bland, easy-to-digest foods in small amounts as you are able. These foods include bananas, applesauce, rice, lean meats, toast, and crackers. Avoid spicy or fatty foods.  Medicines Take over-the-counter and prescription medicines only as told by your health care provider. If you were prescribed antibiotics, take them as told by your health care provider. Do not stop using the antibiotic even if you start to feel better. General instructions  Wash your hands often using soap and water for at least 20 seconds. If soap and water are not available, use hand sanitizer. Others in the household should wash their hands as well. Hands should be washed: After using the toilet or changing a diaper. Before  preparing, cooking, or serving food. While caring for a sick person or while visiting someone in a hospital. Rest at home while you recover. Take a warm bath to relieve any burning or pain from frequent diarrhea episodes. Watch your condition for any changes. Contact a health care provider if: You have a fever. Your diarrhea gets worse. You have new symptoms. You vomit every time you eat or drink. You feel light-headed, dizzy, or have a headache. You have muscle cramps. You have signs of dehydration, such as: Dark urine, very little urine, or no urine. Cracked lips. Dry mouth. Sunken eyes. Sleepiness. Weakness. You have bloody or black stools or stools that look like tar. You have severe pain, cramping, or bloating in your abdomen. Your skin feels cold and clammy. You feel confused. Get help right away if: You have chest pain or your heart is beating very quickly. You have trouble breathing or you are breathing very quickly. You feel extremely weak or you faint. These symptoms may be an emergency. Get help right away. Call 911. Do not wait to see if the symptoms will go away. Do not drive yourself to the hospital. This information is not intended to replace advice given to you by your health care provider. Make sure you discuss any questions you have with your health care provider. Document Revised: 01/18/2022 Document Reviewed: 01/18/2022 Elsevier Patient Education  2023 [employer] Inc.  

## 2022-08-09 ENCOUNTER — Encounter: Payer: Self-pay | Admitting: Family Medicine

## 2022-08-10 ENCOUNTER — Encounter: Payer: Self-pay | Admitting: Family Medicine

## 2022-08-10 ENCOUNTER — Other Ambulatory Visit: Payer: Self-pay | Admitting: Family Medicine

## 2022-08-10 DIAGNOSIS — J40 Bronchitis, not specified as acute or chronic: Secondary | ICD-10-CM

## 2022-08-10 MED ORDER — ALBUTEROL SULFATE HFA 108 (90 BASE) MCG/ACT IN AERS: 2.0000 | INHALATION_SPRAY | Freq: Four times a day (QID) | RESPIRATORY_TRACT | 3 refills | Status: DC | PRN

## 2022-08-10 NOTE — Telephone Encounter (Signed)
Pt had virtual visit on 12/22 for concerns. Please advise

## 2022-10-10 ENCOUNTER — Other Ambulatory Visit: Payer: Self-pay | Admitting: Cardiology

## 2022-10-24 ENCOUNTER — Encounter: Payer: Self-pay | Admitting: Family Medicine

## 2022-10-24 ENCOUNTER — Ambulatory Visit (INDEPENDENT_AMBULATORY_CARE_PROVIDER_SITE_OTHER): Payer: 59 | Admitting: Family Medicine

## 2022-10-24 VITALS — BP 108/61 | Ht 62.0 in | Wt 209.0 lb

## 2022-10-24 DIAGNOSIS — M2550 Pain in unspecified joint: Secondary | ICD-10-CM

## 2022-10-24 DIAGNOSIS — R350 Frequency of micturition: Secondary | ICD-10-CM

## 2022-10-24 DIAGNOSIS — G4452 New daily persistent headache (NDPH): Secondary | ICD-10-CM

## 2022-10-24 DIAGNOSIS — H538 Other visual disturbances: Secondary | ICD-10-CM | POA: Diagnosis not present

## 2022-10-24 DIAGNOSIS — R768 Other specified abnormal immunological findings in serum: Secondary | ICD-10-CM

## 2022-10-24 LAB — POC URINALSYSI DIPSTICK (AUTOMATED)
Blood, UA: NEGATIVE
Glucose, UA: NEGATIVE
Ketones, UA: NEGATIVE
Leukocytes, UA: NEGATIVE
Nitrite, UA: NEGATIVE
Protein, UA: POSITIVE — AB
Spec Grav, UA: 1.025 (ref 1.010–1.025)
Urobilinogen, UA: 0.2 E.U./dL
pH, UA: 6 (ref 5.0–8.0)

## 2022-10-24 NOTE — Patient Instructions (Addendum)
For gradual vision changes - see your eye doctor.  Try gluten-free diet for at least 3 weeks or to see if any of your symptoms improve Neurology referral placed at your request for new daily, persistent headaches Recommend increasing hydration, at least 60 ounces of water daily.  Labs ordered for various joint pains.

## 2022-10-24 NOTE — Progress Notes (Signed)
Acute Office Visit  Subjective:     Patient ID: Kaitlyn Morgan, female    DOB: Sep 30, 1965, 57 y.o.   MRN: [id number]  Chief Complaint  Patient presents with   Headache     Patient is in today for various symptoms.   Patient states that she has had a mild dull headache, mostly frontal/bilateral daily for the past 4 weeks. Reports that headache tends to be worse first thing in the morning and later in the evening. Described as a dull pressure. States she typically has spring allergies, but they haven't seem to be bothering her yet. Additionally, she has noticed some progressive blurry vision gradually over the past month or so. She has had to change her readers - reports she has an appointment with her eye doctor later this week. She has not been seeing floaters or having any eye pain, redness, swelling. Reports she did have one episode last week of some "foggy" vision while working in the yard that resolved spontaneously within a few minutes. Additionally she reports she has been having some generalized joint aches (knees, wrists, ankles, along with some occasional bilateral lower back pain and just today, some urinary frequency. States she has though of several potential triggers for her various symptoms including her dietary choices and dehydration. Reports that she decided to attempt diet changes including gluten-free and low-sodium diets, but was not able to stick with them for more than a few days before feeling like she wanted to try something different. Additionally, she reports she does not drink much water during the day. Currently at time of appointment (approximately 4:30 pm) all she has to eat/drink all day is one cup of coffee. States it is not unusual for her to go all day without drinking water. Patient denies any chest pain, palpitations, dyspnea, wheezing, edema, slurred speech, new weakness, gait changes, confusion/memory changes, rhinorrhea, sneezing, fevers, joint swelling,  rashes.       All review of systems negative except what is listed in the HPI      Objective:    BP 108/61   Ht '5\' 2"'$  (1.575 m)   Wt 209 lb (94.8 kg)   BMI 38.23 kg/m    Physical Exam Vitals reviewed.  Constitutional:      Appearance: Normal appearance. She is well-developed.  HENT:     Head: Normocephalic and atraumatic.     Mouth/Throat:     Mouth: Mucous membranes are moist.  Eyes:     General: No visual field deficit.    Extraocular Movements: Extraocular movements intact.     Pupils: Pupils are equal, round, and reactive to light.  Cardiovascular:     Rate and Rhythm: Normal rate and regular rhythm.     Pulses: Normal pulses.     Heart sounds: Normal heart sounds.  Pulmonary:     Effort: Pulmonary effort is normal.     Breath sounds: Normal breath sounds.  Musculoskeletal:     Cervical back: Normal range of motion and neck supple. No rigidity.  Skin:    General: Skin is warm and dry.  Neurological:     Mental Status: She is alert and oriented to person, place, and time.     Cranial Nerves: No cranial nerve deficit, dysarthria or facial asymmetry.     Sensory: No sensory deficit.     Motor: No weakness.     Coordination: Coordination normal.     Gait: Gait normal.  Psychiatric:  Mood and Affect: Mood is anxious.        Behavior: Behavior normal.        Thought Content: Thought content normal.        Judgment: Judgment normal.       Results for orders placed or performed in visit on 10/24/22  POCT Urinalysis Dipstick (Automated)  Result Value Ref Range   Color, UA Yellow    Clarity, UA Clear    Glucose, UA Negative Negative   Bilirubin, UA Moderate    Ketones, UA Negative    Spec Grav, UA 1.025 1.010 - 1.025   Blood, UA Negative    pH, UA 6.0 5.0 - 8.0   Protein, UA Positive (A) Negative   Urobilinogen, UA 0.2 0.2 or 1.0 E.U./dL   Nitrite, UA Negative    Leukocytes, UA Negative Negative        Assessment & Plan:   Problem List  Items Addressed This Visit   None Visit Diagnoses     New daily persistent headache    -  Primary   Relevant Orders   Ambulatory referral to Neurology   CBC with Differential/Platelet   Comprehensive metabolic panel   TSH   Blurred vision       Urinary frequency       Relevant Orders   POCT Urinalysis Dipstick (Automated) (Completed)   Urine Culture   Urine Microscopic Only   Polyarthralgia       Relevant Orders   CBC with Differential/Platelet   Comprehensive metabolic panel   Rheumatoid Factor   TSH   Antinuclear Antib (ANA)      Various complaints today. No alarm findings on exam. She is scheduled to see her eye doctor this week. Adding labs. For headaches, consider dehydration, gluten intolerance, allergic rhinitis/sinus pressure - discussed trial of gluten free diet, increasing daily water intake, and starting her regular allergy meds (Flonase and Claritin). PRN Tylenol/NSAIDs - caution for rebound headaches. She would like me to go ahead and place a neurology referral in case these changes do not resolve headaches.  UA with some protein and bili - sending for culture and micro, adding CMP.  Patient aware of signs/symptoms requiring further/urgent evaluation.     No orders of the defined types were placed in this encounter.   Return if symptoms worsen or fail to improve.  Terrilyn Saver, NP

## 2022-10-25 ENCOUNTER — Other Ambulatory Visit (INDEPENDENT_AMBULATORY_CARE_PROVIDER_SITE_OTHER): Payer: 59

## 2022-10-25 DIAGNOSIS — M2550 Pain in unspecified joint: Secondary | ICD-10-CM | POA: Diagnosis not present

## 2022-10-25 DIAGNOSIS — G4452 New daily persistent headache (NDPH): Secondary | ICD-10-CM | POA: Diagnosis not present

## 2022-10-25 LAB — URINALYSIS, MICROSCOPIC ONLY

## 2022-10-26 ENCOUNTER — Encounter: Payer: Self-pay | Admitting: Family Medicine

## 2022-10-26 LAB — COMPREHENSIVE METABOLIC PANEL
ALT: 10 U/L (ref 0–35)
AST: 15 U/L (ref 0–37)
Albumin: 4.2 g/dL (ref 3.5–5.2)
Alkaline Phosphatase: 75 U/L (ref 39–117)
BUN: 11 mg/dL (ref 6–23)
CO2: 27 mEq/L (ref 19–32)
Calcium: 9.3 mg/dL (ref 8.4–10.5)
Chloride: 104 mEq/L (ref 96–112)
Creatinine, Ser: 0.81 mg/dL (ref 0.40–1.20)
GFR: 81.75 mL/min (ref >60.00)
Glucose, Bld: 82 mg/dL (ref 70–99)
Potassium: 4.1 mEq/L (ref 3.5–5.1)
Sodium: 141 mEq/L (ref 135–145)
Total Bilirubin: 0.9 mg/dL (ref 0.2–1.2)
Total Protein: 7 g/dL (ref 6.0–8.3)

## 2022-10-26 LAB — CBC WITH DIFFERENTIAL/PLATELET
Basophils Absolute: 0.1 10*3/uL (ref 0.0–0.1)
Basophils Relative: 1.2 % (ref 0.0–3.0)
Eosinophils Absolute: 0.1 10*3/uL (ref 0.0–0.7)
Eosinophils Relative: 1.4 % (ref 0.0–5.0)
HCT: 39.4 % (ref 36.0–46.0)
Hemoglobin: 13.5 g/dL (ref 12.0–15.0)
Lymphocytes Relative: 28.5 % (ref 12.0–46.0)
Lymphs Abs: 1.6 10*3/uL (ref 0.7–4.0)
MCHC: 34.3 g/dL (ref 30.0–36.0)
MCV: 89.1 fl (ref 78.0–100.0)
Monocytes Absolute: 0.3 10*3/uL (ref 0.1–1.0)
Monocytes Relative: 5.9 % (ref 3.0–12.0)
Neutro Abs: 3.5 10*3/uL (ref 1.4–7.7)
Neutrophils Relative %: 63 % (ref 43.0–77.0)
Platelets: 306 10*3/uL (ref 150.0–400.0)
RBC: 4.43 Mil/uL (ref 3.87–5.11)
RDW: 14.3 % (ref 11.5–15.5)
WBC: 5.6 10*3/uL (ref 4.0–10.5)

## 2022-10-26 LAB — URINE CULTURE
MICRO NUMBER:: 14681138
SPECIMEN QUALITY:: ADEQUATE

## 2022-10-26 LAB — TSH: TSH: 3.06 u[IU]/mL (ref 0.35–5.50)

## 2022-10-27 ENCOUNTER — Emergency Department (HOSPITAL_COMMUNITY): Payer: 59

## 2022-10-27 ENCOUNTER — Telehealth: Payer: Self-pay | Admitting: Family Medicine

## 2022-10-27 ENCOUNTER — Emergency Department (HOSPITAL_COMMUNITY)
Admission: EM | Admit: 2022-10-27 | Discharge: 2022-10-27 | Disposition: A | Discharge status: Home / Self Care | Payer: 59 | Attending: Emergency Medicine | Admitting: Emergency Medicine

## 2022-10-27 ENCOUNTER — Encounter (HOSPITAL_COMMUNITY): Payer: Self-pay

## 2022-10-27 DIAGNOSIS — R531 Weakness: Secondary | ICD-10-CM | POA: Insufficient documentation

## 2022-10-27 DIAGNOSIS — H538 Other visual disturbances: Secondary | ICD-10-CM | POA: Insufficient documentation

## 2022-10-27 DIAGNOSIS — R519 Headache, unspecified: Secondary | ICD-10-CM | POA: Diagnosis not present

## 2022-10-27 DIAGNOSIS — Z7982 Long term (current) use of aspirin: Secondary | ICD-10-CM | POA: Diagnosis not present

## 2022-10-27 DIAGNOSIS — H539 Unspecified visual disturbance: Secondary | ICD-10-CM

## 2022-10-27 LAB — URINALYSIS, ROUTINE W REFLEX MICROSCOPIC
Bacteria, UA: NONE SEEN
Bilirubin Urine: NEGATIVE
Glucose, UA: NEGATIVE mg/dL
Ketones, ur: NEGATIVE mg/dL
Nitrite: NEGATIVE
Protein, ur: NEGATIVE mg/dL
Specific Gravity, Urine: 1.018 (ref 1.005–1.030)
pH: 7 (ref 5.0–8.0)

## 2022-10-27 LAB — ANTI-NUCLEAR AB-TITER (ANA TITER): ANA Titer 1: 1:40 {titer} — ABNORMAL HIGH

## 2022-10-27 LAB — CBC
HCT: 40.4 % (ref 36.0–46.0)
Hemoglobin: 13.3 g/dL (ref 12.0–15.0)
MCH: 29.8 pg (ref 26.0–34.0)
MCHC: 32.9 g/dL (ref 30.0–36.0)
MCV: 90.4 fL (ref 80.0–100.0)
Platelets: 277 10*3/uL (ref 150–400)
RBC: 4.47 MIL/uL (ref 3.87–5.11)
RDW: 13.9 % (ref 11.5–15.5)
WBC: 5.2 10*3/uL (ref 4.0–10.5)
nRBC: 0 % (ref 0.0–0.2)

## 2022-10-27 LAB — BASIC METABOLIC PANEL
Anion gap: 8 (ref 5–15)
BUN: 12 mg/dL (ref 6–20)
CO2: 26 mmol/L (ref 22–32)
Calcium: 8.9 mg/dL (ref 8.9–10.3)
Chloride: 106 mmol/L (ref 98–111)
Creatinine, Ser: 0.79 mg/dL (ref 0.44–1.00)
GFR, Estimated: >60 mL/min (ref >60)
Glucose, Bld: 88 mg/dL (ref 70–99)
Potassium: 4 mmol/L (ref 3.5–5.1)
Sodium: 140 mmol/L (ref 135–145)

## 2022-10-27 LAB — ANA: Anti Nuclear Antibody (ANA): POSITIVE — AB

## 2022-10-27 LAB — CBG MONITORING, ED: Glucose-Capillary: 89 mg/dL (ref 70–99)

## 2022-10-27 LAB — RHEUMATOID FACTOR: Rheumatoid fact SerPl-aCnc: <14 IU/mL (ref <14)

## 2022-10-27 MED ORDER — METOCLOPRAMIDE HCL 5 MG/ML IJ SOLN
10.0000 mg | Freq: Once | INTRAMUSCULAR | Status: AC
  Administered 2022-10-27: 10 mg via INTRAVENOUS
  Filled 2022-10-27: qty 2

## 2022-10-27 MED ORDER — LORAZEPAM 1 MG PO TABS
2.0000 mg | ORAL_TABLET | Freq: Once | ORAL | Status: DC
  Filled 2022-10-27: qty 2

## 2022-10-27 MED ORDER — DIPHENHYDRAMINE HCL 25 MG PO CAPS
25.0000 mg | ORAL_CAPSULE | Freq: Once | ORAL | Status: AC
  Administered 2022-10-27: 25 mg via ORAL
  Filled 2022-10-27: qty 1

## 2022-10-27 NOTE — Telephone Encounter (Signed)
Initial Comment Caller states she is having low back pain, and a headache. Caller also states she has been getting headaches. Caller states she also is seeing floaters in her OD, and she is also having blurred vision. Translation No Nurse Assessment Nurse: Ysidro Evert, RN, Levada Dy Date/Time (Eastern Time): 10/27/2022 8:29:54 AM Confirm and document reason for call. If symptomatic, describe symptoms. ---Caller states she is having vision changes that started a couple weeks ago. She has had headaches and back pain Does the patient have any new or worsening symptoms? ---Yes Will a triage be completed? ---Yes Related visit to physician within the last 2 weeks? ---Yes Does the PT have any chronic conditions? (i.e. diabetes, asthma, this includes High risk factors for pregnancy, etc.) ---No Is this a behavioral health or substance abuse call? ---No Guidelines Guideline Title Affirmed Question Affirmed Notes Nurse Date/Time (Eastern Time) Vision Loss or Change [1] Blurred vision or visual changes AND [2] present now AND [3] sudden onset or new (e.g., minutes, hours, days) (Exception: Seeing floaters / black specks OR previously diagnosed migraine Ysidro Evert, RN, Levada Dy 10/27/2022 8:34:29 AM PLEASE NOTE: All timestamps contained within this report are represented as [place] Standard Time. CONFIDENTIALTY NOTICE: This fax transmission is intended only for the addressee. It contains information that is legally privileged, confidential or otherwise protected from use or disclosure. If you are not the intended recipient, you are strictly prohibited from reviewing, disclosing, copying using or disseminating any of this information or taking any action in reliance on or regarding this information. If you have received this fax in error, please notify us immediately by telephone so that we can arrange for its return to [place]. Phone: [phone], Toll-Free: [phone], Fax: [phone] Page: 2 of  2 Call Id: [id number] Guidelines Guideline Title Affirmed Question Affirmed Notes Nurse Date/Time Eilene Ghazi Time) headaches with same symptoms.) Disp. Time Eilene Ghazi Time) Disposition Final User 10/27/2022 8:38:53 AM Go to ED Now (or PCP triage) Yes Ysidro Evert, RN, Levada Dy Final Disposition 10/27/2022 8:38:53 AM Go to ED Now (or PCP triage) Yes Ysidro Evert, RN, Marin Shutter Disagree/Comply Comply Caller Understands Yes PreDisposition Did not know what to do Care Advice Given Per Guideline GO TO ED NOW (OR PCP TRIAGE): CARE ADVICE given per Vision Loss or Change (Adult) guideline. ANOTHER ADULT SHOULD DRIVE: * It is better and safer if another adult drives instead of you. Referrals [facility] - [facility]

## 2022-10-27 NOTE — Telephone Encounter (Signed)
Pt called in stating that her symptoms are still persisting. She is concerned because last night the pain intensified and the pain radiated through her body. She said that could barely breathe the pain was so intense. She said that she reported double vision when she came in on 3/11. Pt said her abdomen is swelling. She seemed to be anxious and worried that something is seriously wrong. Advised that I would transfer to triage and send message back to taylor. Pt verbalized understanding.

## 2022-10-27 NOTE — ED Provider Notes (Signed)
EMERGENCY DEPARTMENT AT [facility] Provider Note   CSN: [id number] Arrival date & time: 10/27/22  1048     History  Chief Complaint  Patient presents with   Weakness   Diplopia    Kaitlyn Morgan is a 57 y.o. female.   Weakness  Patient is a 57 year old female with past medical history significant for  Patient presented emergency room today with complaints of intermittent headaches over the past 3 weeks that are sometimes accompanied by double vision which she describes as almost a shadowing behind objects.  She also states that she occasionally has some blurry vision in her left eye but is mild and seems to be transitory.  She had an episode a week ago where she felt like she had darkening in both of her eyes but her vision nearly immediately returned to normal.  She did not have a curtain dropping over her vision but rather some loss of dropping of her vision sensation.  She denies any pain currently other than a mild posterior headache and some low back pain which has bothered her for a long time.  She denies any urinary frequency urgency dysuria hematuria.  No saddle anesthesia numbness or weakness of the lower extremities.  She tells me that she did have an episode within the past week but several days ago at least where she felt like her arms became quite weak.  She also tells me that she has some right knee pain and felt that her right leg was weaker than normal as well.     Home Medications Prior to Admission medications   Medication Sig Start Date End Date Taking? Authorizing Provider  albuterol (VENTOLIN HFA) 108 (90 Base) MCG/ACT inhaler Inhale 2 puffs into the lungs every 6 (six) hours as needed for wheezing or shortness of breath. 08/10/22   [name], DO  ALPRAZolam (XANAX) 0.25 MG tablet Take 1 tablet (0.25 mg total) by mouth 2 (two) times daily as needed for anxiety. 10/15/21   [name], DO  aspirin EC (ASPIRIN LOW  DOSE) 81 MG tablet TAKE 1 TABLET(81 MG) BY MOUTH DAILY. SWALLOW WHOLE 10/10/22   Park Liter, MD  azithromycin (ZITHROMAX Z-PAK) 250 MG tablet As directed 08/05/22   Carollee Herter, Kendrick Fries R, DO  estradiol (VIVELLE-DOT) 0.1 MG/24HR patch APPLY 1 PATCH TOPICALLY TO THE SKIN 2 TIMES A WEEK    [provider]  hydrochlorothiazide (HYDRODIURIL) 25 MG tablet TAKE 1 TABLET(25 MG) BY MOUTH DAILY 12/16/21   Carollee Herter, Yvonne R, DO  OMEPRAZOLE PO Take by mouth.    [provider]  phentermine (ADIPEX-P) 37.5 MG tablet Take 18.75-37.5 mg by mouth daily. 02/18/22   [provider]  predniSONE (DELTASONE) 10 MG tablet TAKE 3 TABLETS PO QD FOR 3 DAYS THEN TAKE 2 TABLETS PO QD FOR 3 DAYS THEN TAKE 1 TABLET PO QD FOR 3 DAYS THEN TAKE 1/2 TAB PO QD FOR 3 DAYS 08/05/22   [name], DO  promethazine-dextromethorphan (PROMETHAZINE-DM) 6.25-15 MG/5ML syrup Take 5 mLs by mouth 4 (four) times daily as needed. 08/05/22   [name], DO  topiramate (TOPAMAX) 25 MG tablet Take 25 mg by mouth daily. 02/17/22   [provider]      Allergies    Codeine, Morphine and related, Sucralose, Zolpidem tartrate, and Zolpidem tartrate    Review of Systems   Review of Systems  Neurological:  Positive for weakness.  Physical Exam Updated Vital Signs BP (!) 139/94 (BP Location: Right Arm)   Pulse 68   Temp 98.5 F (36.9 C) (Oral)   Resp 18   SpO2 98%  Physical Exam Vitals and nursing note reviewed.  Constitutional:      General: She is not in acute distress. HENT:     Head: Normocephalic and atraumatic.     Nose: Nose normal.  Eyes:     General: No scleral icterus. Cardiovascular:     Rate and Rhythm: Normal rate and regular rhythm.     Pulses: Normal pulses.     Heart sounds: Normal heart sounds.  Pulmonary:     Effort: Pulmonary effort is normal. No respiratory distress.     Breath sounds: No wheezing.  Abdominal:     Palpations: Abdomen is soft.      Tenderness: There is no abdominal tenderness.  Musculoskeletal:     Cervical back: Normal range of motion.     Right lower leg: No edema.     Left lower leg: No edema.  Skin:    General: Skin is warm and dry.     Capillary Refill: Capillary refill takes less than 2 seconds.  Neurological:     Mental Status: She is alert. Mental status is at baseline.     Comments: Alert and oriented to self, place, time and event.   Speech is fluent, clear without dysarthria or dysphasia.   Strength 5/5 in upper/lower extremities   Sensation intact in upper/lower extremities   Normal gait.  Normal finger-to-nose and feet tapping.  CN I not tested  CN II grossly intact visual fields bilaterally. Did not visualize posterior eye.  CN III, IV, VI PERRLA and EOMs intact bilaterally  CN V Intact sensation to sharp and light touch to the face  CN VII facial movements symmetric  CN VIII not tested  CN IX, X no uvula deviation, symmetric rise of soft palate  CN XI 5/5 SCM and trapezius strength bilaterally  CN XII Midline tongue protrusion, symmetric L/R movements     Psychiatric:        Mood and Affect: Mood normal.        Behavior: Behavior normal.     ED Results / Procedures / Treatments   Labs (all labs ordered are listed, but only abnormal results are displayed) Labs Reviewed  URINALYSIS, ROUTINE W REFLEX MICROSCOPIC - Abnormal; Notable for the following components:      Result Value   Hgb urine dipstick SMALL (*)    Leukocytes,Ua TRACE (*)    All other components within normal limits  BASIC METABOLIC PANEL  CBC  CBG MONITORING, ED    EKG EKG Interpretation  Date/Time:  Thursday October 27 2022 11:00:00 EDT Ventricular Rate:  76 PR Interval:  134 QRS Duration: 97 QT Interval:  402 QTC Calculation: 452 R Axis:   21 Text Interpretation: Sinus rhythm Low voltage, extremity and precordial leads Baseline wander in lead(s) V5 Confirmed by Lavenia Atlas [phone]) on 10/27/2022 5:00:58  PM  Radiology CT Head Wo Contrast  Result Date: 10/27/2022 CLINICAL DATA:  Provided history: Headache, new onset. Vision changes. History of rectal cancer. EXAM: CT HEAD WITHOUT CONTRAST TECHNIQUE: Contiguous axial images were obtained from the base of the skull through the vertex without intravenous contrast. RADIATION DOSE REDUCTION: This exam was performed according to the departmental dose-optimization program which includes automated exposure control, adjustment of the mA and/or kV according to patient size and/or use of iterative reconstruction  technique. COMPARISON:  No pertinent prior exams available for comparison. FINDINGS: Brain: Cerebral volume is normal. Partially empty sella turcica. There is no acute intracranial hemorrhage. No demarcated cortical infarct. No extra-axial fluid collection. No evidence of an intracranial mass. No midline shift. Vascular: No hyperdense vessel. Atherosclerotic calcifications. Skull: No fracture or aggressive osseous lesion. Sinuses/Orbits: No mass or acute finding within the imaged orbits. No significant paranasal sinus disease at the imaged levels. IMPRESSION: No evidence of acute intracranial hemorrhage, acute infarct or intracranial mass. Partially empty sella turcica. This finding can reflect incidental anatomic variation, or alternatively, it can be associated with idiopathic intracranial hypertension (pseudotumor cerebri). Electronically Signed   By: Kellie Simmering D.O.   On: 10/27/2022 12:16    Procedures Procedures    Medications Ordered in ED Medications  LORazepam (ATIVAN) tablet 2 mg (2 mg Oral Not Given 10/27/22 1950)  metoCLOPramide (REGLAN) injection 10 mg (10 mg Intravenous Given 10/27/22 1812)  diphenhydrAMINE (BENADRYL) capsule 25 mg (25 mg Oral Given 10/27/22 1812)    ED Course/ Medical Decision Making/ A&P Clinical Course as of 10/27/22 2127  Thu Oct 27, 2022  1505 CT Head Wo Contrast [CG]  1607 Arm/leg weakness for several weeks.   Posterior headache.   Diplopia during episodes of HA [WF]    Clinical Course User Index [CG] Azucena Cecil, PA-C [WF] Tedd Sias, [place]                             Medical Decision Making Amount and/or Complexity of Data Reviewed Labs: ordered.  Risk Prescription drug management.   This patient presents to the ED for concern of headache, this involves a number of treatment options, and is a complaint that carries with it a moderate to high risk of complications and morbidity. A differential diagnosis was considered for the patient's symptoms which is discussed below:   Emergent considerations for headache include subarachnoid hemorrhage, meningitis, temporal arteritis, glaucoma, cerebral ischemia, carotid/vertebral dissection, intracranial tumor, Venous sinus thrombosis, carbon monoxide poisoning, acute or chronic subdural hemorrhage.  Other considerations include: Migraine, Cluster headache, Hypertension, Caffeine, alcohol, or drug withdrawal, Pseudotumor cerebri, Arteriovenous malformation, Head injury, Neurocysticercosis, Post-lumbar puncture, Preeclampsia, Tension headache, Sinusitis, Cervical arthritis, Refractive error causing strain, Dental abscess, Otitis media, Temporomandibular joint syndrome, Depression, Somatoform disorder (eg, somatization) Trigeminal neuralgia, Glossopharyngeal neuralgia.    Co morbidities: Discussed in HPI   Brief History:  Patient is a 57 year old female with past medical history significant for  Patient presented emergency room today with complaints of intermittent headaches over the past 3 weeks that are sometimes accompanied by double vision which she describes as almost a shadowing behind objects.  She also states that she occasionally has some blurry vision in her left eye but is mild and seems to be transitory.  She had an episode a week ago where she felt like she had darkening in both of her eyes but her vision nearly immediately  returned to normal.  She did not have a curtain dropping over her vision but rather some loss of dropping of her vision sensation.  She denies any pain currently other than a mild posterior headache and some low back pain which has bothered her for a long time.  She denies any urinary frequency urgency dysuria hematuria.  No saddle anesthesia numbness or weakness of the lower extremities.    EMR reviewed including pt PMHx, past surgical history and past visits to ER.  See HPI for more details   Lab Tests:   I personally reviewed all laboratory work and imaging. Metabolic panel without any acute abnormality specifically kidney function within normal limits and no significant electrolyte abnormalities. CBC without leukocytosis or significant anemia.   Imaging Studies:  NAD. I personally reviewed all imaging studies and no acute abnormality found. I agree with radiology interpretation.    IMPRESSION:  No evidence of acute intracranial hemorrhage, acute infarct or  intracranial mass.    Partially empty sella turcica. This finding can reflect incidental  anatomic variation, or alternatively, it can be associated with  idiopathic intracranial hypertension (pseudotumor cerebri).      Electronically Signed    By: Kellie Simmering D.O.    On: 10/27/2022 12:16    Cardiac Monitoring:  NA EKG non-ischemic   Medicines ordered:  I ordered medication including Reglan Benadryl for headache Reevaluation of the patient after these medicines showed that the patient improved I have reviewed the patients home medicines and have made adjustments as needed   Critical Interventions:      Consults/Attending Physician   I discussed this case with my attending physician who cosigned this note including patient's presenting symptoms, physical exam, and planned diagnostics and interventions. Attending physician stated agreement with plan or made changes to plan which were  implemented.   Reevaluation:  After the interventions noted above I re-evaluated patient and found that they have :improved   Social Determinants of Health:      Problem List / ED Course:  Patient with intermittent abnormal neurologic function including at times vision changes and also headaches and some arm weakness that was very transient lasting only for moments.  Considered pseudotumor cerebri who for headaches.  Low suspicion for giant cell arteritis.  Low suspicion for stroke given the intermittent episodes of pain and symptoms.  MS certainly worth considering all of these episodes are quite quick.  She ultimately declined MRI because she felt improved and wanted to go home.  She will follow-up with neurology and return the emergency room if needed.   Dispostion:  After consideration of the diagnostic results and the patients response to treatment, I feel that the patent would benefit from MRI brain with and without contrast and MRI C-spine without and with contrast however patient would like to be discharged home at this time she feels much better after headache cocktail and currently has no neurologic symptoms.   She will follow-up with neurology.   Final Clinical Impression(s) / ED Diagnoses Final diagnoses:  Vision changes  Weakness    Rx / DC Orders ED Discharge Orders     None         Tedd Sias, [place] 10/27/22 2130    Lorelle Gibbs, DO 10/28/22 0021

## 2022-10-27 NOTE — Discharge Instructions (Addendum)
As we discussed I recommend staying for MRI of your brain and cervical spine.  Since you are deciding to leave I recommend [facility] up with a neurologist [facility]y group in [place].  If you develop any new or concerning symptoms please return the emergency room

## 2022-10-27 NOTE — Telephone Encounter (Signed)
See below

## 2022-10-27 NOTE — ED Triage Notes (Signed)
Pt presents with c/o double vision, weakness for several weeks. Pt's PCP sent her to the ER for evaluation.

## 2022-10-27 NOTE — Addendum Note (Signed)
Addended by: Caleen Jobs B on: 10/27/2022 10:09 PM   Modules accepted: Orders

## 2022-10-27 NOTE — ED Provider Triage Note (Signed)
Emergency Medicine Provider Triage Evaluation Note  Kaitlyn Morgan , a 57 y.o. female  was evaluated in triage.  Pt complains of multiple complaints. Intermittent headache with double vision, lower back pain ongoing for 2-3 weeks.  Sent here by PCP.  No fever, neck stiffness, focal numbness, dysuria  Review of Systems  Positive: As above Negative: As above  Physical Exam  BP (!) 138/90 (BP Location: Right Arm)   Pulse 83   Temp 97.9 F (36.6 C) (Oral)   Resp 18   SpO2 100%  Gen:   Awake, no distress   Resp:  Normal effort  MSK:   Moves extremities without difficulty  Other:    Medical Decision Making  Medically screening exam initiated at 11:25 AM.  Appropriate orders placed.  Kaitlyn Morgan was informed that the remainder of the evaluation will be completed by another provider, this initial triage assessment does not replace that evaluation, and the importance of remaining in the ED until their evaluation is complete.     Domenic Moras, PA-C 10/27/22 1128

## 2022-10-27 NOTE — ED Notes (Signed)
Kaitlyn Mounts, NP to speak with pt about MRI, pt wanting to go home and follow up with neurologist in am. Discharge papers and follow up reviewed with pt. Pt ambulatory from ED

## 2022-10-27 NOTE — Telephone Encounter (Signed)
Pt at ED.

## 2022-11-06 ENCOUNTER — Other Ambulatory Visit: Payer: Self-pay | Admitting: Cardiology

## 2022-11-10 ENCOUNTER — Telehealth: Payer: Self-pay | Admitting: Family Medicine

## 2022-11-10 NOTE — Telephone Encounter (Signed)
Pt said that the rheumatologist cannot see her until the end of July and she does not want to wait until then as she is still having symptoms. Pt would like to know if she can be referred to another place that could see her sooner. Please call to advise.

## 2022-11-10 NOTE — Telephone Encounter (Signed)
Do you know of another rheumatologist that can possible see her before June/July?

## 2022-11-18 ENCOUNTER — Telehealth: Payer: Self-pay | Admitting: Family Medicine

## 2022-11-18 NOTE — Telephone Encounter (Signed)
Labs faxed

## 2022-11-18 NOTE — Telephone Encounter (Signed)
[facility] is requesting pt's most recent labs faxed to [phone].

## 2022-11-22 ENCOUNTER — Ambulatory Visit (INDEPENDENT_AMBULATORY_CARE_PROVIDER_SITE_OTHER): Payer: 59 | Admitting: Diagnostic Neuroimaging

## 2022-11-22 ENCOUNTER — Encounter: Payer: Self-pay | Admitting: Diagnostic Neuroimaging

## 2022-11-22 VITALS — BP 108/68 | HR 73 | Ht 62.0 in | Wt 212.8 lb

## 2022-11-22 DIAGNOSIS — H538 Other visual disturbances: Secondary | ICD-10-CM

## 2022-11-22 DIAGNOSIS — R531 Weakness: Secondary | ICD-10-CM | POA: Diagnosis not present

## 2022-11-22 DIAGNOSIS — H532 Diplopia: Secondary | ICD-10-CM

## 2022-11-22 NOTE — Patient Instructions (Signed)
-   check MRI brain and labs 

## 2022-11-22 NOTE — Progress Notes (Signed)
[facility]  PATIENT: Kaitlyn Morgan DOB: 1966-07-07  REFERRING CLINICIAN: Clayborne Dana, NP HISTORY FROM: patient  REASON FOR VISIT: new consult    HISTORICAL  CHIEF COMPLAINT:  Chief Complaint  Patient presents with   New Patient (Initial Visit)    Patient in room #6 and alone. Patient states she having daily persistent headaches and abnormal lab work. Patient states she lightheaded and feel weak and dry at times.    HISTORY OF PRESENT ILLNESS:   57 year old female here for evaluation of headaches.  Since February 2024 patient has had a new onset of headache and pain in her eyes, back of her head and temples.  Symptoms associated with double vision blurry vision.  Also having brain fog, eye pain and nausea.  Having some sensitivity to light and sound.  Also having some issues with low back pain, right knee pain, joint pain.  She also had some episodes of weakness lasting for few days at a time.  She had episode of unilateral right-sided numbness in 2020 that lasted for several hours.  Has history of rectal cancer in 2017 status post treatment.  Has had some additional generalized issues since that time including weight gain brain fog.  Has been to eye doctor recently for evaluation and no disc edema was noted on 11/04/2022.   REVIEW OF SYSTEMS: Full 14 system review of systems performed and negative with exception of: as per HPI.  ALLERGIES: Allergies  Allergen Reactions   Codeine Nausea Only   Morphine And Related Itching and Nausea Only   Sucralose Nausea And Vomiting    Fever   Zolpidem Tartrate     Hallucinations    Zolpidem Tartrate     Other reaction(s): Other (See Comments) Hallucinations Hallucinations    HOME MEDICATIONS: Outpatient Medications Prior to Visit  Medication Sig Dispense Refill   aspirin EC (ASPIRIN LOW DOSE) 81 MG tablet Take 1 tablet (81 mg total) by mouth daily. Patient needs appointment for further refills. 1 st  attempt 30 tablet 0   estradiol (VIVELLE-DOT) 0.1 MG/24HR patch APPLY 1 PATCH TOPICALLY TO THE SKIN 2 TIMES A WEEK     hydrochlorothiazide (HYDRODIURIL) 25 MG tablet TAKE 1 TABLET(25 MG) BY MOUTH DAILY 90 tablet 3   Hyoscyamine Sulfate SL 0.125 MG SUBL Take 0.125 mg by mouth QID.     OMEPRAZOLE PO Take by mouth.     albuterol (VENTOLIN HFA) 108 (90 Base) MCG/ACT inhaler Inhale 2 puffs into the lungs every 6 (six) hours as needed for wheezing or shortness of breath. (Patient not taking: Reported on 11/22/2022) 1 each 3   ALPRAZolam (XANAX) 0.25 MG tablet Take 1 tablet (0.25 mg total) by mouth 2 (two) times daily as needed for anxiety. (Patient not taking: Reported on 11/22/2022) 20 tablet 0   azithromycin (ZITHROMAX Z-PAK) 250 MG tablet As directed (Patient not taking: Reported on 11/22/2022) 6 each 0   phentermine (ADIPEX-P) 37.5 MG tablet Take 18.75-37.5 mg by mouth daily. (Patient not taking: Reported on 11/22/2022)     predniSONE (DELTASONE) 10 MG tablet TAKE 3 TABLETS PO QD FOR 3 DAYS THEN TAKE 2 TABLETS PO QD FOR 3 DAYS THEN TAKE 1 TABLET PO QD FOR 3 DAYS THEN TAKE 1/2 TAB PO QD FOR 3 DAYS (Patient not taking: Reported on 11/22/2022) 20 tablet 0   promethazine-dextromethorphan (PROMETHAZINE-DM) 6.25-15 MG/5ML syrup Take 5 mLs by mouth 4 (four) times daily as needed. (Patient not taking: Reported on 11/22/2022) 118 mL 0  topiramate (TOPAMAX) 25 MG tablet Take 25 mg by mouth daily. (Patient not taking: Reported on 11/22/2022)     No facility-administered medications prior to visit.    PAST MEDICAL HISTORY: Past Medical History:  Diagnosis Date   Anemia    during pregnancy, not now.   Arthritis    osteoarthritis-neck"some issues with cervical vertebrae"   Asthma    Atypical syncope    1 episode -never had follow up with cardiology-never any further episodes, did go to urgent care.   Back pain    Colon cancer    rectal cancer Radiation and oral chemo with radiation.   Complication of anesthesia     Constipation    Edema, lower extremity    Family history of breast cancer    Family history of uterine cancer    Genital lesion, female    at times   Heart burn    occ.   Hx of seasonal allergies    Joint pain    Motion sickness    PONV (postoperative nausea and vomiting)    Rectal cancer    Shortness of breath    Swallowing difficulty    Umbilicus discharge     PAST SURGICAL HISTORY: Past Surgical History:  Procedure Laterality Date   ABDOMINAL HYSTERECTOMY     atypical cell, hx abnormal pap test   CESAREAN SECTION     COLON SURGERY     DIAGNOSTIC LAPAROSCOPY     with D and C and Ovarian cystectomy, tubal ligation done at this time '00   DILATION AND CURETTAGE OF UTERUS     TUBAL LIGATION     XI ROBOTIC ASSISTED LOWER ANTERIOR RESECTION N/A 03/21/2016   Procedure: XI ROBOTIC ASSISTED LOWER ANTERIOR RESECTION;  Surgeon: Romie LeveeAlicia Thomas, MD;  Location: [facility];  Service: General;  Laterality: N/A;    FAMILY HISTORY: Family History  Problem Relation Age of Onset   Supraventricular tachycardia Mother    Alzheimer's disease Mother    Hypertension Father    COPD Father    Alcoholism Father    Hypertension Sister    Hypertension Sister    Breast cancer Maternal Grandmother        dx in her 1960s   Cancer Paternal Grandmother 960       uterine cancer    Hypertension Paternal Grandfather    Heart attack Paternal Grandfather    Skin cancer Cousin        maternal first cousin    SOCIAL HISTORY: Social History   Socioeconomic History   Marital status: Legally Separated    Spouse name: Not on file   Number of children: 3   Years of education: Not on file   Highest education level: Not on file  Occupational History   Occupation: Data processing managertelecommuter auditor  Tobacco Use   Smoking status: Never    Passive exposure: Never   Smokeless tobacco: Never  Vaping Use   Vaping Use: Never used  Substance and Sexual Activity   Alcohol use: No    Alcohol/week: 0.0 standard drinks of  alcohol   Drug use: No   Sexual activity: Not Currently    Birth control/protection: Abstinence  Other Topics Concern   Not on file  Social History Narrative   No exercise   Social Determinants of Health   Financial Resource Strain: Not on file  Food Insecurity: Not on file  Transportation Needs: Not on file  Physical Activity: Not on file  Stress: Not on file  Social  Connections: Not on file  Intimate Partner Violence: Not on file     PHYSICAL EXAM  GENERAL EXAM/CONSTITUTIONAL: Vitals:  Vitals:   11/22/22 1139  BP: 108/68  Pulse: 73  Weight: 212 lb 12.8 oz (96.5 kg)  Height: 5\' 2"  (1.575 m)   Body mass index is 38.92 kg/m. Wt Readings from Last 3 Encounters:  11/22/22 212 lb 12.8 oz (96.5 kg)  10/24/22 209 lb (94.8 kg)  03/01/22 202 lb 12.8 oz (92 kg)   Patient is in no distress; well developed, nourished and groomed; neck is supple  CARDIOVASCULAR: Examination of carotid arteries is normal; no carotid bruits Regular rate and rhythm, no murmurs Examination of peripheral vascular system by observation and palpation is normal  EYES: Ophthalmoscopic exam of optic discs and posterior segments is normal; no papilledema or hemorrhages No results found.  MUSCULOSKELETAL: Gait, strength, tone, movements noted in Neurologic exam below  NEUROLOGIC: MENTAL STATUS:      No data to display         awake, alert, oriented to person, place and time recent and remote memory intact normal attention and concentration language fluent, comprehension intact, naming intact fund of knowledge appropriate  CRANIAL NERVE:  2nd - no papilledema on fundoscopic exam 2nd, 3rd, 4th, 6th - pupils equal and reactive to light, visual fields full to confrontation, extraocular muscles intact, no nystagmus 5th - facial sensation symmetric 7th - facial strength symmetric 8th - hearing intact 9th - palate elevates symmetrically, uvula midline 11th - shoulder shrug symmetric 12th -  tongue protrusion midline  MOTOR:  normal bulk and tone, full strength in the BUE, BLE  SENSORY:  normal and symmetric to light touch, temperature, vibration  COORDINATION:  finger-nose-finger, fine finger movements normal  REFLEXES:  deep tendon reflexes TRACE and symmetric  GAIT/STATION:  narrow based gait     DIAGNOSTIC DATA (LABS, IMAGING, TESTING) - I reviewed patient records, labs, notes, testing and imaging myself where available.  Lab Results  Component Value Date   WBC 5.2 10/27/2022   HGB 13.3 10/27/2022   HCT 40.4 10/27/2022   MCV 90.4 10/27/2022   PLT 277 10/27/2022      Component Value Date/Time   NA 140 10/27/2022 1134   NA 141 05/18/2020 1339   NA 143 04/19/2017 0935   K 4.0 10/27/2022 1134   K 4.3 04/19/2017 0935   CL 106 10/27/2022 1134   CO2 26 10/27/2022 1134   CO2 29 04/19/2017 0935   GLUCOSE 88 10/27/2022 1134   GLUCOSE 83 04/19/2017 0935   BUN 12 10/27/2022 1134   BUN 17 05/18/2020 1339   BUN 9.7 04/19/2017 0935   CREATININE 0.79 10/27/2022 1134   CREATININE 0.80 02/05/2021 1150   CREATININE 0.82 11/20/2020 1546   CREATININE 0.8 04/19/2017 0935   CALCIUM 8.9 10/27/2022 1134   CALCIUM 9.9 04/19/2017 0935   PROT 7.0 10/25/2022 1544   PROT 7.4 05/18/2020 1339   PROT 7.6 04/19/2017 0935   ALBUMIN 4.2 10/25/2022 1544   ALBUMIN 5.0 (H) 05/18/2020 1339   ALBUMIN 3.9 04/19/2017 0935   AST 15 10/25/2022 1544   AST 18 02/05/2021 1150   AST 19 04/19/2017 0935   ALT 10 10/25/2022 1544   ALT 9 02/05/2021 1150   ALT 11 04/19/2017 0935   ALKPHOS 75 10/25/2022 1544   ALKPHOS 105 04/19/2017 0935   BILITOT 0.9 10/25/2022 1544   BILITOT 1.1 02/05/2021 1150   BILITOT 0.80 04/19/2017 0935   GFRNONAA >60 10/27/2022 1134  GFRNONAA >60 02/05/2021 1150   GFRAA 97 05/18/2020 1339   GFRAA >60 09/27/2018 1612   Lab Results  Component Value Date   CHOL 155 03/01/2022   HDL 54.30 03/01/2022   LDLCALC 88 03/01/2022   TRIG 64.0 03/01/2022    CHOLHDL 3 03/01/2022   Lab Results  Component Value Date   HGBA1C 5.2 03/01/2022   Lab Results  Component Value Date   VITAMINB12 >2000 (H) 05/18/2020   Lab Results  Component Value Date   TSH 3.06 10/25/2022    10/27/22 CT head - No evidence of acute intracranial hemorrhage, acute infarct or intracranial mass. - Partially empty sella turcica. This finding can reflect incidental anatomic variation, or alternatively,  it can be associated with idiopathic intracranial hypertension (pseudotumor cerebri).    ASSESSMENT AND PLAN  57 y.o. year old female here with:  Meds tried: topiramate (side effects)  Dx:  1. Double vision   2. Blurred vision   3. Weakness      PLAN:  NEW ONSET HEADACHES, BLURRED VISION, DOUBLE VISION , BRAIN FOG, WEAKNESS (since Feb / March 2024; other intermittent symptoms going back to 2020-2022) - check MRI brain (rule out demyelinating dz; may need to consider LP for opening pressure and OCB testing; needs IIH eval) - check B12, ANA, ANCA, AchR - request notes from Dr. Dione Booze  Orders Placed This Encounter  Procedures   MR BRAIN W WO CONTRAST   Vitamin B12   ANA,IFA RA Diag Pnl w/rflx Tit/Patn   ANCA Profile   AChR Abs with Reflex to MuSK   Return for pending if symptoms worsen or fail to improve, pending test results.    Suanne Marker, MD 11/22/2022, 12:21 PM Certified in Neurology, Neurophysiology and Neuroimaging  [facility] [address], [address] [place], [place] [address] [phone]

## 2022-11-24 ENCOUNTER — Telehealth: Payer: Self-pay | Admitting: Diagnostic Neuroimaging

## 2022-11-24 NOTE — Telephone Encounter (Signed)
vm is full sent mychart message to call me back to schedule

## 2022-11-29 LAB — ACHR ABS WITH REFLEX TO MUSK: AChR Binding Ab, Serum: <0.03 nmol/L (ref 0.00–0.24)

## 2022-11-29 LAB — MUSK ANTIBODIES: MuSK Antibodies: <1 U/mL

## 2022-11-29 LAB — ANCA PROFILE
Anti-MPO Antibodies: <0.2 units (ref 0.0–0.9)
Anti-PR3 Antibodies: <0.2 units (ref 0.0–0.9)
Atypical pANCA: <1:20 {titer}
C-ANCA: <1:20 {titer}
P-ANCA: <1:20 {titer}

## 2022-11-29 LAB — VITAMIN B12: Vitamin B-12: 754 pg/mL (ref 232–1245)

## 2022-11-29 LAB — ANA,IFA RA DIAG PNL W/RFLX TIT/PATN
ANA Titer 1: NEGATIVE
Cyclic Citrullin Peptide Ab: 11 units (ref 0–19)
Rheumatoid fact SerPl-aCnc: <10 IU/mL (ref <14.0)

## 2022-11-30 ENCOUNTER — Ambulatory Visit (INDEPENDENT_AMBULATORY_CARE_PROVIDER_SITE_OTHER): Payer: 59

## 2022-11-30 DIAGNOSIS — H538 Other visual disturbances: Secondary | ICD-10-CM

## 2022-11-30 DIAGNOSIS — R531 Weakness: Secondary | ICD-10-CM

## 2022-11-30 DIAGNOSIS — H532 Diplopia: Secondary | ICD-10-CM

## 2022-11-30 MED ORDER — GADOBENATE DIMEGLUMINE 529 MG/ML IV SOLN
20.0000 mL | Freq: Once | INTRAVENOUS | Status: AC | PRN
  Administered 2022-11-30: 20 mL via INTRAVENOUS

## 2022-12-06 ENCOUNTER — Encounter: Payer: Self-pay | Admitting: Neurology

## 2022-12-22 ENCOUNTER — Ambulatory Visit: Payer: 59 | Admitting: Diagnostic Neuroimaging

## 2023-01-05 ENCOUNTER — Other Ambulatory Visit: Payer: Self-pay | Admitting: Family Medicine

## 2023-01-05 DIAGNOSIS — R600 Localized edema: Secondary | ICD-10-CM

## 2023-02-20 ENCOUNTER — Encounter: Payer: 59 | Admitting: Internal Medicine

## 2023-02-20 NOTE — Progress Notes (Deleted)
Office Visit Note  Patient: Kaitlyn Morgan             Date of Birth: Aug 30, 1965           MRN: [id number]             PCP: Donato Schultz, DO Referring: Clayborne Dana, NP Visit Date: 02/20/2023 Occupation: @GUAROCC @  Subjective:  No chief complaint on file.   History of Present Illness: Dealie Zwieg is a 57 y.o. female here for evaluation of positive ANA checked in association with multiple joint pains also developed recent onset new vision change and severe headaches. Neurology visit in May was negative on repeat antibody testing.***   Labs reviewed 11/2022 ANA neg RF neg CCP neg  10/2022 ANA 1:40 fine speckled AChR neg ANCAs neg RF neg  Activities of Daily Living:  Patient reports morning stiffness for *** {minute/hour:19697}.   Patient {ACTIONS;DENIES/REPORTS:21021675::"Denies"} nocturnal pain.  Difficulty dressing/grooming: {ACTIONS;DENIES/REPORTS:21021675::"Denies"} Difficulty climbing stairs: {ACTIONS;DENIES/REPORTS:21021675::"Denies"} Difficulty getting out of chair: {ACTIONS;DENIES/REPORTS:21021675::"Denies"} Difficulty using hands for taps, buttons, cutlery, and/or writing: {ACTIONS;DENIES/REPORTS:21021675::"Denies"}  No Rheumatology ROS completed.   PMFS History:  Patient Active Problem List   Diagnosis Date Noted   Severe anxiety with panic 10/15/2021   Atypical chest pain 10/05/2021   Swallowing difficulty 10/01/2021   Shortness of breath 10/01/2021   Motion sickness 10/01/2021   Joint pain 10/01/2021   Hx of seasonal allergies 10/01/2021   Heart burn 10/01/2021   Genital lesion, female 10/01/2021   Complication of anesthesia 10/01/2021   Colon cancer (HCC) 10/01/2021   Back pain 10/01/2021   Atypical syncope 10/01/2021   Asthma 10/01/2021   Arthritis 10/01/2021   Anemia in pregnancy 10/01/2021   History of kidney stones 11/20/2020   Umbilical discharge 11/20/2020   Periumbilical abdominal pain 11/20/2020   Lower extremity edema  10/25/2019   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   OSA (obstructive sleep apnea) 07/24/2019   Wheezing 07/24/2019   Atherosclerosis of aorta (HCC) 05/22/2019   Other fatigue 05/22/2019   Slow transit constipation 05/22/2019   Palpitation 05/22/2019   Gastroesophageal reflux disease 05/22/2019   Morbid obesity (HCC) 05/22/2019   Raynaud's disease 05/21/2019   Diarrhea 05/11/2016   Dehydration 05/11/2016   Nausea with vomiting 05/11/2016   Anxiety 05/11/2016   Genetic testing 02/19/2016   Family history of breast cancer    Family history of uterine cancer    Rectal cancer (HCC) 12/18/2015    Past Medical History:  Diagnosis Date   Anemia    during pregnancy, not now.   Arthritis    osteoarthritis-neck"some issues with cervical vertebrae"   Asthma    Atypical syncope    1 episode -never had follow up with cardiology-never any further episodes, did go to urgent care.   Back pain    Colon cancer (HCC)    rectal cancer Radiation and oral chemo with radiation.   Complication of anesthesia    Constipation    Edema, lower extremity    Family history of breast cancer    Family history of uterine cancer    Genital lesion, female    at times   Heart burn    occ.   Hx of seasonal allergies    Joint pain    Motion sickness    PONV (postoperative nausea and vomiting)    Rectal cancer (HCC)    Shortness of breath    Swallowing difficulty    Umbilicus discharge     Family History  Problem Relation Age  of Onset   Supraventricular tachycardia Mother    Alzheimer's disease Mother    Hypertension Father    COPD Father    Alcoholism Father    Hypertension Sister    Hypertension Sister    Breast cancer Maternal Grandmother        dx in her 21s   Cancer Paternal Grandmother 18       uterine cancer    Hypertension Paternal Grandfather    Heart attack Paternal Grandfather    Skin cancer Cousin        maternal first cousin   Past Surgical History:  Procedure Laterality Date    ABDOMINAL HYSTERECTOMY     atypical cell, hx abnormal pap test   CESAREAN SECTION     COLON SURGERY     DIAGNOSTIC LAPAROSCOPY     with D and C and Ovarian cystectomy, tubal ligation done at this time '00   DILATION AND CURETTAGE OF UTERUS     TUBAL LIGATION     XI ROBOTIC ASSISTED LOWER ANTERIOR RESECTION N/A 03/21/2016   Procedure: XI ROBOTIC ASSISTED LOWER ANTERIOR RESECTION;  Surgeon: Romie Levee, MD;  Location: [facility];  Service: General;  Laterality: N/A;   Social History   Social History Narrative   No exercise   Immunization History  Administered Date(s) Administered   Tdap 03/01/2022     Objective: Vital Signs: There were no vitals taken for this visit.   Physical Exam   Musculoskeletal Exam: ***  CDAI Exam: CDAI Score: -- Patient Global: --; Provider Global: -- Swollen: --; Tender: -- Joint Exam 02/20/2023   No joint exam has been documented for this visit   There is currently no information documented on the homunculus. Go to the Rheumatology activity and complete the homunculus joint exam.  Investigation: No additional findings.  Imaging: No results found.  Recent Labs: Lab Results  Component Value Date   WBC 5.2 10/27/2022   HGB 13.3 10/27/2022   PLT 277 10/27/2022   NA 140 10/27/2022   K 4.0 10/27/2022   CL 106 10/27/2022   CO2 26 10/27/2022   GLUCOSE 88 10/27/2022   BUN 12 10/27/2022   CREATININE 0.79 10/27/2022   BILITOT 0.9 10/25/2022   ALKPHOS 75 10/25/2022   AST 15 10/25/2022   ALT 10 10/25/2022   PROT 7.0 10/25/2022   ALBUMIN 4.2 10/25/2022   CALCIUM 8.9 10/27/2022   GFRAA 97 05/18/2020    Speciality Comments: No specialty comments available.  Procedures:  No procedures performed Allergies: Codeine, Morphine and codeine, Sucralose, Zolpidem tartrate, and Zolpidem tartrate   Assessment / Plan:     Visit Diagnoses: No diagnosis found.  Orders: No orders of the defined types were placed in this encounter.  No orders of the  defined types were placed in this encounter.   Face-to-face time spent with patient was *** minutes. Greater than 50% of time was spent in counseling and coordination of care.  Follow-Up Instructions: No follow-ups on file.   Fuller Plan, MD  Note - This record has been created using [employer].  Chart creation errors have been sought, but may not always  have been located. Such creation errors do not reflect on  the standard of medical care.

## 2023-03-06 ENCOUNTER — Other Ambulatory Visit: Payer: Self-pay | Admitting: Cardiology

## 2023-03-23 ENCOUNTER — Ambulatory Visit: Payer: 59 | Admitting: Family Medicine

## 2023-03-24 ENCOUNTER — Encounter: Payer: Self-pay | Admitting: Family Medicine

## 2023-03-24 ENCOUNTER — Ambulatory Visit: Payer: 59 | Admitting: Family Medicine

## 2023-03-24 VITALS — BP 110/70 | HR 78 | Temp 97.7°F | Resp 18 | Ht 62.0 in | Wt 214.4 lb

## 2023-03-24 DIAGNOSIS — R350 Frequency of micturition: Secondary | ICD-10-CM | POA: Diagnosis not present

## 2023-03-24 DIAGNOSIS — Z85048 Personal history of other malignant neoplasm of rectum, rectosigmoid junction, and anus: Secondary | ICD-10-CM

## 2023-03-24 DIAGNOSIS — R768 Other specified abnormal immunological findings in serum: Secondary | ICD-10-CM

## 2023-03-24 DIAGNOSIS — R300 Dysuria: Secondary | ICD-10-CM | POA: Diagnosis not present

## 2023-03-24 DIAGNOSIS — C20 Malignant neoplasm of rectum: Secondary | ICD-10-CM

## 2023-03-24 DIAGNOSIS — K5901 Slow transit constipation: Secondary | ICD-10-CM

## 2023-03-24 LAB — CBC WITH DIFFERENTIAL/PLATELET
Absolute Monocytes: 563 cells/uL (ref 200–950)
Basophils Absolute: 8 cells/uL (ref 0–200)
Basophils Relative: 0.1 %
Eosinophils Absolute: 113 cells/uL (ref 15–500)
Eosinophils Relative: 1.5 %
HCT: 40.3 % (ref 35.0–45.0)
Hemoglobin: 13.6 g/dL (ref 11.7–15.5)
Lymphs Abs: 2100 cells/uL (ref 850–3900)
MCH: 29.8 pg (ref 27.0–33.0)
MCHC: 33.7 g/dL (ref 32.0–36.0)
MCV: 88.2 fL (ref 80.0–100.0)
MPV: 9.5 fL (ref 7.5–12.5)
Monocytes Relative: 7.5 %
Neutro Abs: 4718 cells/uL (ref 1500–7800)
Neutrophils Relative %: 62.9 %
Platelets: 245 10*3/uL (ref 140–400)
RBC: 4.57 10*6/uL (ref 3.80–5.10)
RDW: 13.8 % (ref 11.0–15.0)
Total Lymphocyte: 28 %
WBC: 7.5 10*3/uL (ref 3.8–10.8)

## 2023-03-24 LAB — POC URINALSYSI DIPSTICK (AUTOMATED)
Blood, UA: NEGATIVE
Glucose, UA: NEGATIVE
Ketones, UA: NEGATIVE
Nitrite, UA: NEGATIVE
Protein, UA: NEGATIVE
Spec Grav, UA: 1.01 (ref 1.010–1.025)
Urobilinogen, UA: 2 E.U./dL — AB
pH, UA: 7 (ref 5.0–8.0)

## 2023-03-24 MED ORDER — NITROFURANTOIN MONOHYD MACRO 100 MG PO CAPS: 100.0000 mg | ORAL_CAPSULE | Freq: Two times a day (BID) | ORAL | 0 refills | Status: DC

## 2023-03-24 NOTE — Progress Notes (Signed)
Established Patient Office Visit  Subjective   Patient ID: Kaitlyn Morgan, female    DOB: 1965-12-20  Age: 57 y.o. MRN: [id number]  No chief complaint on file.   HPI Discussed the use of AI scribe software for clinical note transcription with the patient, who gave verbal consent to proceed.  History of Present Illness   The patient, with a history of rectal cancer, presents with persistent joint pain, vision changes, and urinary symptoms. She reports episodes of sudden, throbbing pain in her ankles, knees, and wrists that are so severe she is unable to walk. These episodes are accompanied by changes in vision, described as "off" and sometimes double, which fluctuate in severity. She also reports urinary frequency and pressure, but denies dysuria.  The patient was recently started on prednisone by a rheumatologist for suspected giant cell arteritis or lupus, but she reports a negative response to the medication, including feeling "awful" and "dry." She also reports constipation and a sensation of dehydration despite adequate fluid intake.  The patient has seen a neurologist, who found no abnormalities on testing. She is awaiting an appointment with a vascular specialist and has a follow-up with the rheumatologist scheduled for the end of the month. She expresses concern about her ongoing symptoms and is seeking further advice on potential causes and treatments.      Patient Active Problem List   Diagnosis Date Noted   Severe anxiety with panic 10/15/2021   Atypical chest pain 10/05/2021   Swallowing difficulty 10/01/2021   Shortness of breath 10/01/2021   Motion sickness 10/01/2021   Joint pain 10/01/2021   Hx of seasonal allergies 10/01/2021   Heart burn 10/01/2021   Genital lesion, female 10/01/2021   Complication of anesthesia 10/01/2021   Colon cancer (HCC) 10/01/2021   Back pain 10/01/2021   Atypical syncope 10/01/2021   Asthma 10/01/2021   Arthritis 10/01/2021   Anemia in  pregnancy 10/01/2021   History of kidney stones 11/20/2020   Umbilical discharge 11/20/2020   Periumbilical abdominal pain 11/20/2020   Lower extremity edema 10/25/2019   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   OSA (obstructive sleep apnea) 07/24/2019   Wheezing 07/24/2019   Atherosclerosis of aorta (HCC) 05/22/2019   Other fatigue 05/22/2019   Slow transit constipation 05/22/2019   Palpitation 05/22/2019   Gastroesophageal reflux disease 05/22/2019   Morbid obesity (HCC) 05/22/2019   Raynaud's disease 05/21/2019   Diarrhea 05/11/2016   Dehydration 05/11/2016   Nausea with vomiting 05/11/2016   Anxiety 05/11/2016   Genetic testing 02/19/2016   Family history of breast cancer    Family history of uterine cancer    Rectal cancer (HCC) 12/18/2015   Past Medical History:  Diagnosis Date   Anemia    during pregnancy, not now.   Arthritis    osteoarthritis-neck"some issues with cervical vertebrae"   Asthma    Atypical syncope    1 episode -never had follow up with cardiology-never any further episodes, did go to urgent care.   Back pain    Colon cancer (HCC)    rectal cancer Radiation and oral chemo with radiation.   Complication of anesthesia    Constipation    Edema, lower extremity    Family history of breast cancer    Family history of uterine cancer    Genital lesion, female    at times   Heart burn    occ.   Hx of seasonal allergies    Joint pain    Motion sickness  PONV (postoperative nausea and vomiting)    Rectal cancer (HCC)    Shortness of breath    Swallowing difficulty    Umbilicus discharge    Past Surgical History:  Procedure Laterality Date   ABDOMINAL HYSTERECTOMY     atypical cell, hx abnormal pap test   CESAREAN SECTION     COLON SURGERY     DIAGNOSTIC LAPAROSCOPY     with D and C and Ovarian cystectomy, tubal ligation done at this time '00   DILATION AND CURETTAGE OF UTERUS     TUBAL LIGATION     XI ROBOTIC ASSISTED LOWER ANTERIOR RESECTION N/A  03/21/2016   Procedure: XI ROBOTIC ASSISTED LOWER ANTERIOR RESECTION;  Surgeon: Romie Levee, MD;  Location: [facility];  Service: General;  Laterality: N/A;   Social History   Tobacco Use   Smoking status: Never    Passive exposure: Never   Smokeless tobacco: Never  Vaping Use   Vaping status: Never Used  Substance Use Topics   Alcohol use: No    Alcohol/week: 0.0 standard drinks of alcohol   Drug use: No   Social History   Socioeconomic History   Marital status: Legally Separated    Spouse name: Not on file   Number of children: 3   Years of education: Not on file   Highest education level: Not on file  Occupational History   Occupation: Data processing manager  Tobacco Use   Smoking status: Never    Passive exposure: Never   Smokeless tobacco: Never  Vaping Use   Vaping status: Never Used  Substance and Sexual Activity   Alcohol use: No    Alcohol/week: 0.0 standard drinks of alcohol   Drug use: No   Sexual activity: Not Currently    Birth control/protection: Abstinence  Other Topics Concern   Not on file  Social History Narrative   No exercise   Social Determinants of Health   Financial Resource Strain: Not on file  Food Insecurity: Not on file  Transportation Needs: Not on file  Physical Activity: Not on file  Stress: Not on file  Social Connections: Not on file  Intimate Partner Violence: Not on file   Family Status  Relation Name Status   Mother  Deceased at age 54       alz dementia   Father  Deceased at age 56   Sister  Alive   Sister  Alive   MGM  Deceased at age 31   MGF  Deceased   PGM  Deceased   PGF  Deceased   Mat Uncle  Alive   Cousin  Alive  No partnership data on file   Family History  Problem Relation Age of Onset   Supraventricular tachycardia Mother    Alzheimer's disease Mother    Hypertension Father    COPD Father    Alcoholism Father    Hypertension Sister    Hypertension Sister    Breast cancer Maternal Grandmother         dx in her 50s   Cancer Paternal Grandmother 64       uterine cancer    Hypertension Paternal Grandfather    Heart attack Paternal Grandfather    Skin cancer Cousin        maternal first cousin   Allergies  Allergen Reactions   Codeine Nausea Only   Morphine And Codeine Itching and Nausea Only   Sucralose Nausea And Vomiting    Fever   Zolpidem Tartrate  Hallucinations    Zolpidem Tartrate     Other reaction(s): Other (See Comments) Hallucinations Hallucinations      Review of Systems  Constitutional:  Negative for fever and malaise/fatigue.  HENT:  Negative for congestion.   Eyes:  Negative for blurred vision.  Respiratory:  Negative for cough and shortness of breath.   Cardiovascular:  Negative for chest pain, palpitations and leg swelling.  Gastrointestinal:  Negative for abdominal pain, blood in stool, nausea and vomiting.  Genitourinary:  Negative for dysuria and frequency.  Musculoskeletal:  Negative for back pain and falls.  Skin:  Negative for rash.  Neurological:  Negative for dizziness, loss of consciousness and headaches.  Endo/Heme/Allergies:  Negative for environmental allergies.  Psychiatric/Behavioral:  Negative for depression. The patient is not nervous/anxious.       Objective:     BP 110/70 (BP Location: Left Arm, Patient Position: Sitting, Cuff Size: Large)   Pulse 78   Temp 97.7 F (36.5 C) (Oral)   Resp 18   Ht 5\' 2"  (1.575 m)   Wt 214 lb 6.4 oz (97.3 kg)   SpO2 99%   BMI 39.21 kg/m  BP Readings from Last 3 Encounters:  03/24/23 110/70  11/22/22 108/68  10/27/22 (!) 139/94   Wt Readings from Last 3 Encounters:  03/24/23 214 lb 6.4 oz (97.3 kg)  11/22/22 212 lb 12.8 oz (96.5 kg)  10/24/22 209 lb (94.8 kg)   SpO2 Readings from Last 3 Encounters:  03/24/23 99%  10/27/22 98%  03/01/22 98%      Physical Exam Vitals and nursing note reviewed.  Constitutional:      General: She is not in acute distress.    Appearance: Normal  appearance. She is well-developed.  HENT:     Head: Normocephalic and atraumatic.  Eyes:     General: No scleral icterus.       Right eye: No discharge.        Left eye: No discharge.  Cardiovascular:     Rate and Rhythm: Normal rate and regular rhythm.     Heart sounds: No murmur heard. Pulmonary:     Effort: Pulmonary effort is normal. No respiratory distress.     Breath sounds: Normal breath sounds.  Abdominal:     General: Abdomen is flat.     Palpations: Abdomen is soft.     Tenderness: There is no abdominal tenderness.  Musculoskeletal:        General: Normal range of motion.     Cervical back: Normal range of motion and neck supple.     Right lower leg: No edema.     Left lower leg: No edema.  Skin:    General: Skin is warm and dry.  Neurological:     Mental Status: She is alert and oriented to person, place, and time.  Psychiatric:        Mood and Affect: Mood normal.        Behavior: Behavior normal.        Thought Content: Thought content normal.        Judgment: Judgment normal.      Results for orders placed or performed in visit on 03/24/23  POCT Urinalysis Dipstick (Automated)  Result Value Ref Range   Color, UA yellow    Clarity, UA hazy    Glucose, UA Negative Negative   Bilirubin, UA small    Ketones, UA negative    Spec Grav, UA 1.010 1.010 - 1.025   Blood, UA  negative    pH, UA 7.0 5.0 - 8.0   Protein, UA Negative Negative   Urobilinogen, UA 2.0 (A) 0.2 or 1.0 E.U./dL   Nitrite, UA negative    Leukocytes, UA Small (1+) (A) Negative    Last CBC Lab Results  Component Value Date   WBC 5.2 10/27/2022   HGB 13.3 10/27/2022   HCT 40.4 10/27/2022   MCV 90.4 10/27/2022   MCH 29.8 10/27/2022   RDW 13.9 10/27/2022   PLT 277 10/27/2022   Last metabolic panel Lab Results  Component Value Date   GLUCOSE 88 10/27/2022   NA 140 10/27/2022   K 4.0 10/27/2022   CL 106 10/27/2022   CO2 26 10/27/2022   BUN 12 10/27/2022   CREATININE 0.79  10/27/2022   GFRNONAA >60 10/27/2022   CALCIUM 8.9 10/27/2022   PROT 7.0 10/25/2022   ALBUMIN 4.2 10/25/2022   LABGLOB 2.4 05/18/2020   AGRATIO 2.1 05/18/2020   BILITOT 0.9 10/25/2022   ALKPHOS 75 10/25/2022   AST 15 10/25/2022   ALT 10 10/25/2022   ANIONGAP 8 10/27/2022   Last lipids Lab Results  Component Value Date   CHOL 155 03/01/2022   HDL 54.30 03/01/2022   LDLCALC 88 03/01/2022   TRIG 64.0 03/01/2022   CHOLHDL 3 03/01/2022   Last hemoglobin A1c Lab Results  Component Value Date   HGBA1C 5.2 03/01/2022   Last thyroid functions Lab Results  Component Value Date   TSH 3.06 10/25/2022   T3TOTAL 153 10/29/2019   T4TOTAL 9.6 10/15/2021   Last vitamin D Lab Results  Component Value Date   VD25OH 30.90 03/01/2022   Last vitamin B12 and Folate Lab Results  Component Value Date   VITAMINB12 754 11/22/2022      The 10-year ASCVD risk score (Arnett DK, et al., 2019) is: 1.1%    Assessment & Plan:   Problem List Items Addressed This Visit       Unprioritized   Slow transit constipation    Probiotic Water  Prunes  Stool softener if needed       Rectal cancer (HCC)    Pt needs to f/u with GI        Relevant Medications   nitrofurantoin, macrocrystal-monohydrate, (MACROBID) 100 MG capsule   Other Visit Diagnoses     Dysuria    -  Primary   Relevant Medications   nitrofurantoin, macrocrystal-monohydrate, (MACROBID) 100 MG capsule   Other Relevant Orders   POCT Urinalysis Dipstick (Automated) (Completed)   Urine Culture   Positive ANA (antinuclear antibody)       Relevant Orders   CBC with Differential/Platelet   Comprehensive metabolic panel   Lipase   TSH   Vitamin B12   VITAMIN D 25 Hydroxy (Vit-D Deficiency, Fractures)   Urinary frequency       Relevant Orders   CBC with Differential/Platelet   Comprehensive metabolic panel   Urine Culture   History of rectal cancer       Relevant Orders   Ambulatory referral to Gastroenterology    Lipase   TSH   Vitamin B12   VITAMIN D 25 Hydroxy (Vit-D Deficiency, Fractures)       No follow-ups on file.    Donato Schultz, DO

## 2023-03-24 NOTE — Assessment & Plan Note (Signed)
Probiotic Water  Prunes  Stool softener if needed

## 2023-03-24 NOTE — Assessment & Plan Note (Signed)
Pt needs to fu with GI

## 2023-03-26 ENCOUNTER — Encounter: Payer: Self-pay | Admitting: Family Medicine

## 2023-03-27 NOTE — Telephone Encounter (Signed)
Pt states [facility] may also have some openings and wanted to know what pcp thinks.

## 2023-04-05 ENCOUNTER — Other Ambulatory Visit: Payer: Self-pay | Admitting: Family Medicine

## 2023-04-05 DIAGNOSIS — R600 Localized edema: Secondary | ICD-10-CM

## 2023-05-15 ENCOUNTER — Encounter: Payer: 59 | Admitting: Surgery

## 2023-05-24 ENCOUNTER — Telehealth: Payer: Self-pay | Admitting: Family Medicine

## 2023-05-24 NOTE — Telephone Encounter (Signed)
Noted  

## 2023-05-24 NOTE — Telephone Encounter (Signed)
Appt scheduled tomorrow w/ Dr. Laury Axon

## 2023-05-24 NOTE — Telephone Encounter (Signed)
Pt called to make up a f/u appt with pcp to discuss blurred vision and migraines. Given this was discussed months ago transferred to triage for nurse eval just in case.

## 2023-05-24 NOTE — Telephone Encounter (Signed)
Initial Comment Caller said slight dull migraine that started back up. She is also having blurred vision . She has saw the doctor before, referred to neurologist. She is asking about migraine medication. Neurologist cant prescribe because referral for blurred vison. She is asking her PCP to prescribe or send a new referral. Additional Comment She has a meeting at [employer] No Nurse Assessment Nurse: Izora Ribas, RN, Melanie Date/Time (Eastern Time): 05/24/2023 11:11:05 AM Confirm and document reason for call. If symptomatic, describe symptoms. ---Caller states has slight dull migraine starting back up this week with blurry vision. PCP referred to neurologist for blurry vision. Requesting migraine medication. Neurologist can't prescribe migraine meds as referral was for blurry vision. Also requesting PCP send a new referral for migraines to neurology. HX: Upper back/base of neck pain - appt tomorrow with ortho, fluid pill Does the patient have any new or worsening symptoms? ---Yes Will a triage be completed? ---Yes Related visit to physician within the last 2 weeks? ---Yes Does the PT have any chronic conditions? (i.e. diabetes, asthma, this includes High risk factors for pregnancy, etc.) ---Yes Is this a behavioral health or substance abuse call? ---No Guidelines Guideline Title Affirmed Question Affirmed Notes Nurse Date/Time Lamount Cohen Time) Headache [1] Almyra Brace, RN, Melanie 05/24/2023 11:13:34 AM PLEASE NOTE: All timestamps contained within this report are represented as [place] Standard Time. CONFIDENTIALTY NOTICE: This fax transmission is intended only for the addressee. It contains information that is legally privileged, confidential or otherwise protected from use or disclosure. If you are not the intended recipient, you are strictly prohibited from reviewing, disclosing, copying using or disseminating any of this information or taking any action in reliance on  or regarding this information. If you have received this fax in error, please notify us immediately by telephone so that we can arrange for its return to [place]. Phone: [phone], Toll-Free: [phone], Fax: [phone] Page: 2 of 2 Call Id: [id number] Guidelines Guideline Title Affirmed Question Affirmed Notes Nurse Date/Time Lamount Cohen Time) headache AND [2] present > 3 days (72 hours) AND [3] no improvement after using Care Advice Disp. Time Lamount Cohen Time) Disposition Final User 05/24/2023 11:17:14 AM SEE PCP WITHIN 3 DAYS Yes Izora Ribas, RN, Melanie Final Disposition 05/24/2023 11:17:14 AM SEE PCP WITHIN 3 DAYS Yes Izora Ribas, RN, Melanie Caller Disagree/Comply Comply Caller Understands Yes PreDisposition Call Doctor Care Advice Given Per Guideline SEE PCP WITHIN 3 DAYS: * You need to be seen within 2 or 3 days. * PCP VISIT: Call your doctor (or NP/PA) during regular office hours and make an appointment. A clinic or urgent care center are good places to go for care if your doctor's office is closed or you can't get an appointment. NOTE: If office will be open tomorrow, tell caller to call then, not in 3 days. PAIN MEDICINES: OTHER OVER-THE-COUNTER PAIN MEDICINES: REST FOR HEADACHE: COLD PACK FOR HEADACHE: STRETCHING: CALL BACK IF: * Severe headache lasts over 2 hours after pain medicine * Stiff neck occurs (can't touch chin to chest) * You become worse CARE ADVICE given per Headache (Adult) guideline. Comments User: Kaitlyn Mane, RN Date/Time Lamount Cohen Time): 05/24/2023 11:16:43 AM 3 advil about 1 hour ago

## 2023-05-25 ENCOUNTER — Encounter: Payer: Self-pay | Admitting: Family Medicine

## 2023-05-25 ENCOUNTER — Ambulatory Visit (INDEPENDENT_AMBULATORY_CARE_PROVIDER_SITE_OTHER): Payer: 59 | Admitting: Family Medicine

## 2023-05-25 VITALS — BP 98/70 | HR 69 | Temp 98.1°F | Resp 18 | Ht 62.0 in | Wt 218.4 lb

## 2023-05-25 DIAGNOSIS — S161XXA Strain of muscle, fascia and tendon at neck level, initial encounter: Secondary | ICD-10-CM

## 2023-05-25 DIAGNOSIS — G4452 New daily persistent headache (NDPH): Secondary | ICD-10-CM

## 2023-05-25 LAB — COMPREHENSIVE METABOLIC PANEL
ALT: 10 U/L (ref 0–35)
AST: 15 U/L (ref 0–37)
Albumin: 4.2 g/dL (ref 3.5–5.2)
Alkaline Phosphatase: 78 U/L (ref 39–117)
BUN: 12 mg/dL (ref 6–23)
CO2: 32 meq/L (ref 19–32)
Calcium: 9.2 mg/dL (ref 8.4–10.5)
Chloride: 101 meq/L (ref 96–112)
Creatinine, Ser: 0.75 mg/dL (ref 0.40–1.20)
GFR: 89.3 mL/min (ref >60.00)
Glucose, Bld: 91 mg/dL (ref 70–99)
Potassium: 3.8 meq/L (ref 3.5–5.1)
Sodium: 141 meq/L (ref 135–145)
Total Bilirubin: 1.1 mg/dL (ref 0.2–1.2)
Total Protein: 6.3 g/dL (ref 6.0–8.3)

## 2023-05-25 LAB — CBC WITH DIFFERENTIAL/PLATELET
Basophils Absolute: 0 10*3/uL (ref 0.0–0.1)
Basophils Relative: 0.8 % (ref 0.0–3.0)
Eosinophils Absolute: 0.1 10*3/uL (ref 0.0–0.7)
Eosinophils Relative: 1.7 % (ref 0.0–5.0)
HCT: 40 % (ref 36.0–46.0)
Hemoglobin: 13.5 g/dL (ref 12.0–15.0)
Lymphocytes Relative: 25.7 % (ref 12.0–46.0)
Lymphs Abs: 1.4 10*3/uL (ref 0.7–4.0)
MCHC: 33.9 g/dL (ref 30.0–36.0)
MCV: 90.1 fL (ref 78.0–100.0)
Monocytes Absolute: 0.3 10*3/uL (ref 0.1–1.0)
Monocytes Relative: 6.5 % (ref 3.0–12.0)
Neutro Abs: 3.5 10*3/uL (ref 1.4–7.7)
Neutrophils Relative %: 65.3 % (ref 43.0–77.0)
Platelets: 311 10*3/uL (ref 150.0–400.0)
RBC: 4.44 Mil/uL (ref 3.87–5.11)
RDW: 14.3 % (ref 11.5–15.5)
WBC: 5.3 10*3/uL (ref 4.0–10.5)

## 2023-05-25 LAB — SEDIMENTATION RATE: Sed Rate: 10 mm/h (ref 0–30)

## 2023-05-25 MED ORDER — CYCLOBENZAPRINE HCL 10 MG PO TABS
10.0000 mg | ORAL_TABLET | Freq: Three times a day (TID) | ORAL | 0 refills | Status: DC | PRN
Start: 2023-05-25 — End: 2023-06-08

## 2023-05-25 MED ORDER — KETOROLAC TROMETHAMINE 60 MG/2ML IM SOLN
60.0000 mg | Freq: Once | INTRAMUSCULAR | Status: AC
Start: 2023-05-25 — End: 2023-05-25
  Administered 2023-05-25: 60 mg via INTRAMUSCULAR

## 2023-05-25 NOTE — Patient Instructions (Signed)

## 2023-05-25 NOTE — Progress Notes (Signed)
Established Patient Office Visit  Subjective   Patient ID: Kaitlyn Morgan, female    DOB: September 16, 1965  Age: 57 y.o. MRN: [id number]  Chief Complaint  Patient presents with   Migraine    Pt states migraine started Monday. Pt states vision is blurry. Pt states face feels full. OTC meds are not helping     HPI Discussed the use of AI scribe software for clinical note transcription with the patient, who gave verbal consent to proceed.  History of Present Illness   The patient, with a history of migraines, presents with a chief complaint of a headache that started a week ago. The headache is described as a dull pain that is felt throughout the face and eyes, which also feel swollen. The patient reports that the headache started after a period of being headache-free for several months. The patient also reports visual disturbances, such as difficulty seeing her phone or computer screen, which started around the same time as the headache. The patient has been managing the headache with over-the-counter medications, such as Tylenol and Advil, but reports that these are not effective.  In addition to the headache, the patient has been experiencing upper back pain for a while. The pain is described as a constant ache that is located between the shoulder blades. The patient has been managing the pain with a heating pad and Advil. The patient also reports a "hump" at the bottom of her neck, which she is concerned about.  The patient also reports difficulty breathing through one nostril, which she noticed when applying a cool compress to her head. The patient has been using Flonase to manage this symptom. The patient also reports weight gain.  The patient denies any fever, coughing, or congestion. The patient has an upcoming appointment with an orthopedic doctor for the back pain.       Patient Active Problem List   Diagnosis Date Noted   Severe anxiety with panic 10/15/2021   Atypical chest pain 10/05/2021   Swallowing difficulty 10/01/2021   Shortness of breath 10/01/2021   Motion sickness 10/01/2021   Joint pain 10/01/2021   Hx of seasonal allergies 10/01/2021   Heart burn 10/01/2021   Genital lesion, female 10/01/2021   Complication of anesthesia 10/01/2021   Colon cancer (HCC) 10/01/2021   Back pain 10/01/2021   Atypical syncope 10/01/2021   Asthma 10/01/2021   Arthritis 10/01/2021   Anemia in pregnancy 10/01/2021   History of kidney stones 11/20/2020   Umbilical discharge 11/20/2020   Periumbilical abdominal pain 11/20/2020   Lower extremity edema 10/25/2019   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   OSA (obstructive sleep apnea) 07/24/2019   Wheezing 07/24/2019   Atherosclerosis of aorta (HCC) 05/22/2019   Other fatigue 05/22/2019   Slow transit constipation 05/22/2019   Palpitation 05/22/2019   Gastroesophageal reflux disease 05/22/2019   Morbid obesity (HCC) 05/22/2019   Raynaud's disease 05/21/2019   Diarrhea 05/11/2016   Dehydration 05/11/2016   Nausea with vomiting 05/11/2016   Anxiety 05/11/2016   Genetic testing 02/19/2016   Family history of breast cancer    Family history of uterine cancer    Rectal cancer (HCC) 12/18/2015   Past Medical History:  Diagnosis Date   Anemia    during pregnancy, not now.   Arthritis    osteoarthritis-neck"some issues with cervical vertebrae"   Asthma    Atypical syncope    1 episode -never had follow up with cardiology-never any further episodes, did go to urgent care.   Back  pain    Colon cancer (HCC)    rectal cancer Radiation and oral chemo with radiation.   Complication of anesthesia    Constipation    Edema, lower extremity    Family history of breast cancer    Family history of uterine cancer    Genital lesion, female    at times   Heart burn    occ.   Hx of seasonal allergies    Joint pain     Motion sickness    PONV (postoperative nausea and vomiting)    Rectal cancer (HCC)    Shortness of breath    Swallowing difficulty    Umbilicus discharge    Past Surgical History:  Procedure Laterality Date   ABDOMINAL HYSTERECTOMY     atypical cell, hx abnormal pap test   CESAREAN SECTION     COLON SURGERY     DIAGNOSTIC LAPAROSCOPY     with D and C and Ovarian cystectomy, tubal ligation done at this time '00   DILATION AND CURETTAGE OF UTERUS     TUBAL LIGATION     XI ROBOTIC ASSISTED LOWER ANTERIOR RESECTION N/A 03/21/2016   Procedure: XI ROBOTIC ASSISTED LOWER ANTERIOR RESECTION;  Surgeon: Romie Levee, MD;  Location: [facility];  Service: General;  Laterality: N/A;   Social History   Tobacco Use   Smoking status: Never    Passive exposure: Never   Smokeless tobacco: Never  Vaping Use   Vaping status: Never Used  Substance Use Topics   Alcohol use: No    Alcohol/week: 0.0 standard drinks of alcohol   Drug use: No   Social History   Socioeconomic History   Marital status: Legally Separated    Spouse name: Not on file   Number of children: 3   Years of education: Not on file   Highest education level: Not on file  Occupational History   Occupation: Data processing manager  Tobacco Use   Smoking status: Never    Passive exposure: Never   Smokeless tobacco: Never  Vaping Use   Vaping status: Never Used  Substance and Sexual Activity   Alcohol use: No    Alcohol/week: 0.0 standard drinks of alcohol   Drug use: No   Sexual activity: Not Currently    Birth control/protection: Abstinence  Other Topics Concern   Not on file  Social History Narrative   No exercise   Social Determinants of Health   Financial Resource Strain: Not on file  Food Insecurity: Not on file  Transportation Needs: Not on file  Physical Activity: Not on file  Stress: Not on file  Social Connections: Not on file  Intimate Partner Violence: Not on file   Family Status  Relation Name  Status   Mother  Deceased at age 4       alz dementia   Father  Deceased at age 74   Sister  Alive   Sister  Alive   MGM  Deceased at age 35   MGF  Deceased   PGM  Deceased   PGF  Deceased   Mat Uncle  Alive   Cousin  Alive  No partnership data on file   Family History  Problem Relation Age of Onset   Supraventricular tachycardia Mother    Alzheimer's disease Mother    Hypertension Father    COPD Father    Alcoholism Father    Hypertension Sister    Hypertension Sister    Breast cancer Maternal Grandmother  dx in her 61s   Cancer Paternal Grandmother 24       uterine cancer    Hypertension Paternal Grandfather    Heart attack Paternal Grandfather    Skin cancer Cousin        maternal first cousin   Allergies  Allergen Reactions   Codeine Nausea Only   Morphine And Codeine Itching and Nausea Only   Sucralose Nausea And Vomiting    Fever   Zolpidem Tartrate     Hallucinations    Zolpidem Tartrate     Other reaction(s): Other (See Comments) Hallucinations Hallucinations      Review of Systems  Constitutional:  Negative for fever and malaise/fatigue.  HENT:  Negative for congestion.   Eyes:  Negative for blurred vision.  Respiratory:  Negative for shortness of breath.   Cardiovascular:  Negative for chest pain, palpitations and leg swelling.  Gastrointestinal:  Negative for abdominal pain, blood in stool and nausea.  Genitourinary:  Negative for dysuria and frequency.  Musculoskeletal:  Positive for back pain and neck pain. Negative for falls.  Skin:  Negative for rash.  Neurological:  Positive for headaches. Negative for dizziness and loss of consciousness.  Endo/Heme/Allergies:  Negative for environmental allergies.  Psychiatric/Behavioral:  Negative for depression. The patient is not nervous/anxious.       Objective:     BP 98/70 (BP Location: Left Arm, Patient Position: Sitting, Cuff Size: Large)   Pulse 69   Temp 98.1 F (36.7 C) (Oral)    Resp 18   Ht 5\' 2"  (1.575 m)   Wt 218 lb 6.4 oz (99.1 kg)   SpO2 99%   BMI 39.95 kg/m  BP Readings from Last 3 Encounters:  05/25/23 98/70  03/24/23 110/70  11/22/22 108/68   Wt Readings from Last 3 Encounters:  05/25/23 218 lb 6.4 oz (99.1 kg)  03/24/23 214 lb 6.4 oz (97.3 kg)  11/22/22 212 lb 12.8 oz (96.5 kg)   SpO2 Readings from Last 3 Encounters:  05/25/23 99%  03/24/23 99%  10/27/22 98%      Physical Exam Vitals and nursing note reviewed.  Constitutional:      General: She is not in acute distress.    Appearance: Normal appearance. She is well-developed.  HENT:     Head: Normocephalic and atraumatic.     Right Ear: Tympanic membrane, ear canal and external ear normal. There is no impacted cerumen.     Left Ear: Tympanic membrane, ear canal and external ear normal. There is no impacted cerumen.     Nose: Nose normal.     Mouth/Throat:     Mouth: Mucous membranes are moist.     Pharynx: Oropharynx is clear. No oropharyngeal exudate or posterior oropharyngeal erythema.  Eyes:     General: No scleral icterus.       Right eye: No discharge.        Left eye: No discharge.     Conjunctiva/sclera: Conjunctivae normal.     Pupils: Pupils are equal, round, and reactive to light.  Neck:     Thyroid: No thyromegaly or thyroid tenderness.     Vascular: No JVD.  Cardiovascular:     Rate and Rhythm: Normal rate and regular rhythm.     Heart sounds: Normal heart sounds. No murmur heard. Pulmonary:     Effort: Pulmonary effort is normal. No respiratory distress.     Breath sounds: Normal breath sounds.  Abdominal:     General: Bowel sounds are normal.  There is no distension.     Palpations: Abdomen is soft. There is no mass.     Tenderness: There is no abdominal tenderness. There is no guarding or rebound.  Genitourinary:    Vagina: Normal.  Musculoskeletal:        General: Tenderness present. Normal range of motion.     Right shoulder: Tenderness present.     Left  shoulder: Tenderness present.     Cervical back: Normal range of motion and neck supple. Spasms and tenderness present.       Back:     Right lower leg: No edema.     Left lower leg: No edema.     Comments: Muscle spasms across shoulder blades and upper back  Lymphadenopathy:     Cervical: No cervical adenopathy.  Skin:    General: Skin is warm and dry.     Findings: No erythema or rash.  Neurological:     Mental Status: She is alert and oriented to person, place, and time.     Cranial Nerves: No cranial nerve deficit.     Deep Tendon Reflexes: Reflexes are normal and symmetric.  Psychiatric:        Mood and Affect: Mood normal.        Behavior: Behavior normal.        Thought Content: Thought content normal.        Judgment: Judgment normal.      No results found for any visits on 05/25/23.  Last CBC Lab Results  Component Value Date   WBC 7.5 03/24/2023   HGB 13.6 03/24/2023   HCT 40.3 03/24/2023   MCV 88.2 03/24/2023   MCH 29.8 03/24/2023   RDW 13.8 03/24/2023   PLT 245 03/24/2023   Last metabolic panel Lab Results  Component Value Date   GLUCOSE 81 03/24/2023   NA 139 03/24/2023   K 3.9 03/24/2023   CL 100 03/24/2023   CO2 30 03/24/2023   BUN 17 03/24/2023   CREATININE 0.77 03/24/2023   GFRNONAA >60 10/27/2022   CALCIUM 8.8 03/24/2023   PROT 6.2 03/24/2023   ALBUMIN 4.2 10/25/2022   LABGLOB 2.4 05/18/2020   AGRATIO 2.1 05/18/2020   BILITOT 2.1 (H) 03/24/2023   ALKPHOS 75 10/25/2022   AST 16 03/24/2023   ALT 17 03/24/2023   ANIONGAP 8 10/27/2022   Last lipids Lab Results  Component Value Date   CHOL 155 03/01/2022   HDL 54.30 03/01/2022   LDLCALC 88 03/01/2022   TRIG 64.0 03/01/2022   CHOLHDL 3 03/01/2022   Last hemoglobin A1c Lab Results  Component Value Date   HGBA1C 5.2 03/01/2022   Last thyroid functions Lab Results  Component Value Date   TSH 3.07 03/24/2023   T3TOTAL 153 10/29/2019   T4TOTAL 9.6 10/15/2021   Last vitamin D Lab  Results  Component Value Date   VD25OH 34 03/24/2023   Last vitamin B12 and Folate Lab Results  Component Value Date   VITAMINB12 379 03/24/2023      The 10-year ASCVD risk score (Arnett DK, et al., 2019) is: 0.9%    Assessment & Plan:   Problem List Items Addressed This Visit   None Visit Diagnoses     New daily persistent headache    -  Primary   Relevant Medications   cyclobenzaprine (FLEXERIL) 10 MG tablet   ketorolac (TORADOL) injection 60 mg (Completed)   Other Relevant Orders   CBC with Differential/Platelet   Comprehensive metabolic panel  Sedimentation rate   Strain of neck muscle, initial encounter       Relevant Medications   cyclobenzaprine (FLEXERIL) 10 MG tablet   Other Relevant Orders   Ambulatory referral to Chiropractic     Assessment and Plan    Headache New onset of dull headache with facial pain and visual disturbances. Possible sinus involvement. No relief with over-the-counter medications. -Administer Toradol injection today to alleviate headache. -Advise patient to take over-the-counter allergy medication (Claritin or Allegra) and Flonase. -If headache persists or worsens, consider imaging of the brain.  Upper Back Pain Chronic upper back pain with muscle tightness and a palpable hump. Patient has an appointment with orthopedics today. -Prescribe muscle relaxer for use at night. -Recommend massage and chiropractic treatment. -If pain persists or worsens, consider further evaluation and imaging.  Weight Management Patient expressed concern about weight gain. -Refer to weight loss clinic. -Advise patient to check with insurance regarding coverage for weight loss treatments.        Return if symptoms worsen or fail to improve.    Donato Schultz, DO

## 2023-06-07 ENCOUNTER — Telehealth: Payer: Self-pay | Admitting: Family Medicine

## 2023-06-07 ENCOUNTER — Other Ambulatory Visit: Payer: Self-pay | Admitting: Family Medicine

## 2023-06-07 DIAGNOSIS — G43719 Chronic migraine without aura, intractable, without status migrainosus: Secondary | ICD-10-CM

## 2023-06-07 MED ORDER — SUMATRIPTAN SUCCINATE 50 MG PO TABS
50.0000 mg | ORAL_TABLET | ORAL | 0 refills | Status: DC | PRN
Start: 2023-06-07 — End: 2023-07-24

## 2023-06-07 NOTE — Telephone Encounter (Signed)
Pt has appt with PCP tomorrow

## 2023-06-07 NOTE — Telephone Encounter (Signed)
Pt called and stated that she is still having symptoms of migraines since her visit with pcp on 10/10. She wasn't sure if Dr. Laury Axon thought she should schedule another appt or send a different prescription in?   For now, she wanted to be added to the scheduled but would like for Dr. Laury Axon to reach out if she doesn't think she needs the appt. Please call and advise.

## 2023-06-08 ENCOUNTER — Ambulatory Visit (INDEPENDENT_AMBULATORY_CARE_PROVIDER_SITE_OTHER): Payer: 59 | Admitting: Family Medicine

## 2023-06-08 ENCOUNTER — Encounter: Payer: Self-pay | Admitting: Family Medicine

## 2023-06-08 VITALS — BP 112/80 | HR 77 | Temp 97.6°F | Resp 18 | Ht 62.0 in | Wt 218.6 lb

## 2023-06-08 DIAGNOSIS — S161XXA Strain of muscle, fascia and tendon at neck level, initial encounter: Secondary | ICD-10-CM | POA: Diagnosis not present

## 2023-06-08 DIAGNOSIS — H538 Other visual disturbances: Secondary | ICD-10-CM

## 2023-06-08 DIAGNOSIS — G43719 Chronic migraine without aura, intractable, without status migrainosus: Secondary | ICD-10-CM

## 2023-06-08 MED ORDER — CYCLOBENZAPRINE HCL 10 MG PO TABS: 10.0000 mg | ORAL_TABLET | Freq: Three times a day (TID) | ORAL | 0 refills | Status: DC | PRN

## 2023-06-08 MED ORDER — KETOROLAC TROMETHAMINE 60 MG/2ML IM SOLN
60.0000 mg | Freq: Once | INTRAMUSCULAR | Status: AC
  Administered 2023-06-08: 60 mg via INTRAMUSCULAR

## 2023-06-08 NOTE — Progress Notes (Signed)
Established Patient Office Visit  Subjective   Patient ID: Kaitlyn Morgan, female    DOB: 1966-06-18  Age: 57 y.o. MRN: [id number]  Chief Complaint  Patient presents with   Migraine    Pt states not feeling better. Pt states having vision troubles. Pt states going to PT and Chiro.   Follow-up    HPI Discussed the use of AI scribe software for clinical note transcription with the patient, who gave verbal consent to proceed.  History of Present Illness   The patient, with a history of scoliosis and arthritis, presents with a headache and blurred vision. They describe the headache as an 'unwell feeling' throughout their body, and not a typical headache. The blurred vision was severe enough to prevent them from reading their computer screen. They deny double vision. The blurred vision and headache symptoms began suddenly and have persisted, though they have improved slightly.  The patient also reports chronic back pain due to mild scoliosis and arthritis. They have been seeing a chiropractor and physical therapist for these issues. They were prescribed meloxicam by an orthopedic doctor, but it did not provide significant relief for their back pain. They also took a muscle relaxer prescribed by a dentist for a previous issue with jaw pain.  The patient is concerned about the impact of these symptoms on their work and is considering applying for [employer]. They deny any previous history of migraines or similar symptoms.      Patient Active Problem List   Diagnosis Date Noted   Severe anxiety with panic 10/15/2021   Atypical chest pain 10/05/2021   Swallowing difficulty 10/01/2021   Shortness of breath 10/01/2021   Motion sickness 10/01/2021   Joint pain 10/01/2021   Hx of seasonal allergies 10/01/2021   Heart burn 10/01/2021   Genital lesion, female 10/01/2021   Complication of anesthesia 10/01/2021   Colon cancer (HCC) 10/01/2021   Back pain 10/01/2021   Atypical syncope 10/01/2021    Asthma 10/01/2021   Arthritis 10/01/2021   Anemia in pregnancy 10/01/2021   History of kidney stones 11/20/2020   Umbilical discharge 11/20/2020   Periumbilical abdominal pain 11/20/2020   Lower extremity edema 10/25/2019   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   OSA (obstructive sleep apnea) 07/24/2019   Wheezing 07/24/2019   Atherosclerosis of aorta (HCC) 05/22/2019   Other fatigue 05/22/2019   Slow transit constipation 05/22/2019   Palpitation 05/22/2019   Gastroesophageal reflux disease 05/22/2019   Morbid obesity (HCC) 05/22/2019   Raynaud's disease 05/21/2019   Diarrhea 05/11/2016   Dehydration 05/11/2016   Nausea with vomiting 05/11/2016   Anxiety 05/11/2016   Genetic testing 02/19/2016   Family history of breast cancer    Family history of uterine cancer    Rectal cancer (HCC) 12/18/2015   Past Medical History:  Diagnosis Date   Anemia    during pregnancy, not now.   Arthritis    osteoarthritis-neck"some issues with cervical vertebrae"   Asthma    Atypical syncope    1 episode -never had follow up with cardiology-never any further episodes, did go to urgent care.   Back pain    Colon cancer (HCC)    rectal cancer Radiation and oral chemo with radiation.   Complication of anesthesia    Constipation    Edema, lower extremity    Family history of breast cancer    Family history of uterine cancer    Genital lesion, female    at times   Heart burn  occ.   Hx of seasonal allergies    Joint pain    Motion sickness    PONV (postoperative nausea and vomiting)    Rectal cancer (HCC)    Shortness of breath    Swallowing difficulty    Umbilicus discharge    Past Surgical History:  Procedure Laterality Date   ABDOMINAL HYSTERECTOMY     atypical cell, hx abnormal pap test   CESAREAN SECTION     COLON SURGERY     DIAGNOSTIC LAPAROSCOPY     with D and C and Ovarian cystectomy, tubal ligation done at this time '00   DILATION AND CURETTAGE OF UTERUS     TUBAL LIGATION      XI ROBOTIC ASSISTED LOWER ANTERIOR RESECTION N/A 03/21/2016   Procedure: XI ROBOTIC ASSISTED LOWER ANTERIOR RESECTION;  Surgeon: Romie Levee, MD;  Location: [facility];  Service: General;  Laterality: N/A;   Social History   Tobacco Use   Smoking status: Never    Passive exposure: Never   Smokeless tobacco: Never  Vaping Use   Vaping status: Never Used  Substance Use Topics   Alcohol use: No    Alcohol/week: 0.0 standard drinks of alcohol   Drug use: No   Social History   Socioeconomic History   Marital status: Legally Separated    Spouse name: Not on file   Number of children: 3   Years of education: Not on file   Highest education level: Not on file  Occupational History   Occupation: Data processing manager  Tobacco Use   Smoking status: Never    Passive exposure: Never   Smokeless tobacco: Never  Vaping Use   Vaping status: Never Used  Substance and Sexual Activity   Alcohol use: No    Alcohol/week: 0.0 standard drinks of alcohol   Drug use: No   Sexual activity: Not Currently    Birth control/protection: Abstinence  Other Topics Concern   Not on file  Social History Narrative   No exercise   Social Determinants of Health   Financial Resource Strain: Not on file  Food Insecurity: Not on file  Transportation Needs: Not on file  Physical Activity: Not on file  Stress: Not on file  Social Connections: Not on file  Intimate Partner Violence: Not on file   Family Status  Relation Name Status   Mother  Deceased at age 59       alz dementia   Father  Deceased at age 100   Sister  Alive   Sister  Alive   MGM  Deceased at age 32   MGF  Deceased   PGM  Deceased   PGF  Deceased   Mat Uncle  Alive   Cousin  Alive  No partnership data on file   Family History  Problem Relation Age of Onset   Supraventricular tachycardia Mother    Alzheimer's disease Mother    Hypertension Father    COPD Father    Alcoholism Father    Hypertension Sister    Hypertension  Sister    Breast cancer Maternal Grandmother        dx in her 57s   Cancer Paternal Grandmother 9       uterine cancer    Hypertension Paternal Grandfather    Heart attack Paternal Grandfather    Skin cancer Cousin        maternal first cousin   Allergies  Allergen Reactions   Codeine Nausea Only   Morphine And Codeine  Itching and Nausea Only   Sucralose Nausea And Vomiting    Fever   Zolpidem Tartrate     Hallucinations    Zolpidem Tartrate     Other reaction(s): Other (See Comments) Hallucinations Hallucinations      ROS    Objective:     BP 112/80 (BP Location: Left Arm, Patient Position: Sitting, Cuff Size: Large)   Pulse 77   Temp 97.6 F (36.4 C) (Oral)   Resp 18   Ht 5\' 2"  (1.575 m)   Wt 218 lb 9.6 oz (99.2 kg)   SpO2 98%   BMI 39.98 kg/m  BP Readings from Last 3 Encounters:  06/08/23 112/80  05/25/23 98/70  03/24/23 110/70   Wt Readings from Last 3 Encounters:  06/08/23 218 lb 9.6 oz (99.2 kg)  05/25/23 218 lb 6.4 oz (99.1 kg)  03/24/23 214 lb 6.4 oz (97.3 kg)   SpO2 Readings from Last 3 Encounters:  06/08/23 98%  05/25/23 99%  03/24/23 99%      Physical Exam   No results found for any visits on 06/08/23.  Last CBC Lab Results  Component Value Date   WBC 5.3 05/25/2023   HGB 13.5 05/25/2023   HCT 40.0 05/25/2023   MCV 90.1 05/25/2023   MCH 29.8 03/24/2023   RDW 14.3 05/25/2023   PLT 311.0 05/25/2023   Last metabolic panel Lab Results  Component Value Date   GLUCOSE 91 05/25/2023   NA 141 05/25/2023   K 3.8 05/25/2023   CL 101 05/25/2023   CO2 32 05/25/2023   BUN 12 05/25/2023   CREATININE 0.75 05/25/2023   GFR 89.30 05/25/2023   CALCIUM 9.2 05/25/2023   PROT 6.3 05/25/2023   ALBUMIN 4.2 05/25/2023   LABGLOB 2.4 05/18/2020   AGRATIO 2.1 05/18/2020   BILITOT 1.1 05/25/2023   ALKPHOS 78 05/25/2023   AST 15 05/25/2023   ALT 10 05/25/2023   ANIONGAP 8 10/27/2022   Last lipids Lab Results  Component Value Date    CHOL 155 03/01/2022   HDL 54.30 03/01/2022   LDLCALC 88 03/01/2022   TRIG 64.0 03/01/2022   CHOLHDL 3 03/01/2022   Last hemoglobin A1c Lab Results  Component Value Date   HGBA1C 5.2 03/01/2022   Last thyroid functions Lab Results  Component Value Date   TSH 3.07 03/24/2023   T3TOTAL 153 10/29/2019   T4TOTAL 9.6 10/15/2021   Last vitamin D Lab Results  Component Value Date   VD25OH 34 03/24/2023   Last vitamin B12 and Folate Lab Results  Component Value Date   VITAMINB12 379 03/24/2023      The 10-year ASCVD risk score (Arnett DK, et al., 2019) is: 1.2%    Assessment & Plan:   Problem List Items Addressed This Visit   None Visit Diagnoses     Intractable chronic migraine without aura and without status migrainosus    -  Primary   Relevant Medications   cyclobenzaprine (FLEXERIL) 10 MG tablet   ketorolac (TORADOL) injection 60 mg (Completed)   Other Relevant Orders   CBC with Differential/Platelet   Comprehensive metabolic panel   Sedimentation rate   Ambulatory referral to Neurology   Blurred vision       Relevant Orders   Ambulatory referral to Neurology   Strain of neck muscle, initial encounter       Relevant Medications   cyclobenzaprine (FLEXERIL) 10 MG tablet   ketorolac (TORADOL) injection 60 mg (Completed)     Assessment and Plan  Headache and Blurred Vision Patient reports feeling unwell with a sensation of an impending headache and blurred vision. Possible ocular migraine. Patient has seen an ophthalmologist in the past with no significant findings. -Refer to neurologist for further evaluation. -Order blood work to rule out systemic causes.  Mild Scoliosis and Arthritis Patient has been diagnosed with mild scoliosis and arthritis. She has been seen by an orthopedic specialist and a chiropractor. She reports forward head posturing and a hump on her back. -Continue with chiropractic treatment. -Consider physical therapy as recommended by the  orthopedic specialist.  Muscle Tension Patient has been prescribed cyclobenzaprine (muscle relaxer) in the past. She reports taking meloxicam without significant relief of back pain. -Advise patient to take cyclobenzaprine as prescribed to help with muscle tension related to scoliosis and arthritis.  General Health Maintenance -Continue monitoring symptoms and follow up with specialists as needed.        No follow-ups on file.    Donato Schultz, DO

## 2023-06-09 LAB — COMPREHENSIVE METABOLIC PANEL
ALT: 10 U/L (ref 0–35)
AST: 16 U/L (ref 0–37)
Albumin: 4.4 g/dL (ref 3.5–5.2)
Alkaline Phosphatase: 74 U/L (ref 39–117)
BUN: 13 mg/dL (ref 6–23)
CO2: 29 meq/L (ref 19–32)
Calcium: 9.3 mg/dL (ref 8.4–10.5)
Chloride: 101 meq/L (ref 96–112)
Creatinine, Ser: 0.79 mg/dL (ref 0.40–1.20)
GFR: 83.88 mL/min (ref >60.00)
Glucose, Bld: 81 mg/dL (ref 70–99)
Potassium: 3.8 meq/L (ref 3.5–5.1)
Sodium: 140 meq/L (ref 135–145)
Total Bilirubin: 0.9 mg/dL (ref 0.2–1.2)
Total Protein: 6.9 g/dL (ref 6.0–8.3)

## 2023-06-09 LAB — CBC WITH DIFFERENTIAL/PLATELET
Basophils Absolute: 0.1 10*3/uL (ref 0.0–0.1)
Basophils Relative: 1.8 % (ref 0.0–3.0)
Eosinophils Absolute: 0.1 10*3/uL (ref 0.0–0.7)
Eosinophils Relative: 1.9 % (ref 0.0–5.0)
HCT: 40.3 % (ref 36.0–46.0)
Hemoglobin: 13 g/dL (ref 12.0–15.0)
Lymphocytes Relative: 25.1 % (ref 12.0–46.0)
Lymphs Abs: 1.5 10*3/uL (ref 0.7–4.0)
MCHC: 32.3 g/dL (ref 30.0–36.0)
MCV: 91.6 fL (ref 78.0–100.0)
Monocytes Absolute: 0.4 10*3/uL (ref 0.1–1.0)
Monocytes Relative: 7.5 % (ref 3.0–12.0)
Neutro Abs: 3.7 10*3/uL (ref 1.4–7.7)
Neutrophils Relative %: 63.7 % (ref 43.0–77.0)
Platelets: 308 10*3/uL (ref 150.0–400.0)
RBC: 4.4 Mil/uL (ref 3.87–5.11)
RDW: 14.2 % (ref 11.5–15.5)
WBC: 5.9 10*3/uL (ref 4.0–10.5)

## 2023-06-09 LAB — SEDIMENTATION RATE: Sed Rate: 5 mm/h (ref 0–30)

## 2023-06-14 LAB — HM MAMMOGRAPHY

## 2023-07-10 ENCOUNTER — Telehealth: Payer: Self-pay

## 2023-07-10 NOTE — Telephone Encounter (Signed)
Patient called in to schedule a follow up appointment. She stated that she was advised by our office that she needed a new referral to be seen so she had her PCP send one in for her. It looks like she is still c/o chronic migraines and blurred vision. I was able to get her scheduled for the next available appointment but it is in March. She is asking if you happen to have any recommendations in the meantime?  Her PCP did prescribe her SUMAtriptan (IMITREX) 50 MG tablet but she has not started taking it yet. She is concerned about this making her drowsy or any side effects. She has an appointment with physical therapy due to back and neck pain, which her PCP thinks could also be exacerbating her symptoms. She let me know she also sees Ortho but they do not believe her ortho issues could be related to her headaches or blurred vision.   She did see Dr Dione Booze in either May or June 2024 and plans to follow back up with them in the new year.

## 2023-07-10 NOTE — Telephone Encounter (Signed)
Called the patient and advised her that I received her message. She states that she has headaches that occur every week sometimes the headache can last one day and sometimes they last 3-5 days.  She states that will start off dull and then intense to the point her vision is affected, pressure feeling in posterior head. She alternates heat and ice and advil and nothing seems to know it out. Pt states that she has tried topiramate before not for headache prevention but for something else and she did have side effects with that. Advised that Dr Marjory Lies is out of the office but I would discuss further with work in provider to see if he recommends trying to start a preventative medication to assist with headache prevention. Advised that they may prefer to wait for Dr Marjory Lies to return to decide if worth starting a medication. In the meantime, the patient was advised that she should take sumatriptan at onset of headache to hopefully knock the migraine out. Reviewed side effects with her and she will give it a try to see if she notes benefit. Informed her she should not be taking more then 8-10 tablet in a month time. If that is the case, we for sure need to discuss preventative medication.

## 2023-07-11 NOTE — Telephone Encounter (Signed)
Called and spoke to patient about medication and she states she tried a dose last night of the sumatriptan and it seemed to help but she is worried she will be out of the medication before the month is up. I advised to use them when she felt absolutely necessary and if she finished the max amt of tablets in less than a month, to reach out and we can speak to the provider again about medications, but she is on the wait list with Dr. Marjory Lies. Patient has no further questions at this time

## 2023-07-23 ENCOUNTER — Other Ambulatory Visit: Payer: Self-pay | Admitting: Family Medicine

## 2023-07-23 DIAGNOSIS — G43719 Chronic migraine without aura, intractable, without status migrainosus: Secondary | ICD-10-CM

## 2023-08-04 ENCOUNTER — Other Ambulatory Visit: Payer: Self-pay | Admitting: Family Medicine

## 2023-08-04 DIAGNOSIS — R600 Localized edema: Secondary | ICD-10-CM

## 2023-10-17 ENCOUNTER — Telehealth: Payer: Self-pay | Admitting: Diagnostic Neuroimaging

## 2023-10-17 NOTE — Telephone Encounter (Signed)
Pt reschedule appt due to scheduling conflict

## 2023-10-19 ENCOUNTER — Other Ambulatory Visit: Payer: Self-pay | Admitting: Cardiology

## 2023-10-23 ENCOUNTER — Institutional Professional Consult (permissible substitution): Payer: 59 | Admitting: Diagnostic Neuroimaging

## 2023-12-05 ENCOUNTER — Ambulatory Visit (INDEPENDENT_AMBULATORY_CARE_PROVIDER_SITE_OTHER): Admitting: Family Medicine

## 2023-12-05 ENCOUNTER — Encounter: Payer: Self-pay | Admitting: Family Medicine

## 2023-12-05 VITALS — BP 118/72 | HR 74 | Resp 18 | Ht 62.0 in | Wt 222.0 lb

## 2023-12-05 DIAGNOSIS — C20 Malignant neoplasm of rectum: Secondary | ICD-10-CM

## 2023-12-05 DIAGNOSIS — G43719 Chronic migraine without aura, intractable, without status migrainosus: Secondary | ICD-10-CM | POA: Diagnosis not present

## 2023-12-05 DIAGNOSIS — F4323 Adjustment disorder with mixed anxiety and depressed mood: Secondary | ICD-10-CM | POA: Diagnosis not present

## 2023-12-05 DIAGNOSIS — H538 Other visual disturbances: Secondary | ICD-10-CM | POA: Diagnosis not present

## 2023-12-05 DIAGNOSIS — Z85048 Personal history of other malignant neoplasm of rectum, rectosigmoid junction, and anus: Secondary | ICD-10-CM | POA: Insufficient documentation

## 2023-12-05 DIAGNOSIS — R197 Diarrhea, unspecified: Secondary | ICD-10-CM

## 2023-12-05 NOTE — Assessment & Plan Note (Signed)
 Per neuro

## 2023-12-05 NOTE — Assessment & Plan Note (Signed)
 S/p surgery pt has trouble with diarrhea

## 2023-12-05 NOTE — Assessment & Plan Note (Signed)
 Due to rectal cancer

## 2023-12-05 NOTE — Progress Notes (Signed)
 Established Patient Office Visit  Subjective   Patient ID: Kaitlyn Morgan, female    DOB: [birth year]  Age: 58 y.o. MRN: [id number]  No chief complaint on file.   HPI Discussed the use of AI scribe software for clinical note transcription with the patient, who gave verbal consent to proceed.  History of Present Illness Kaitlyn Morgan is a 58 year old female with rectal cancer who presents for assistance with jury duty exemption due to bowel urgency issues.  She experiences significant bowel urgency and control issues following extensive surgery for rectal cancer. This has greatly impacted her ability to engage in activities requiring prolonged sitting, such as jury duty. She describes a frequent and urgent need to use the bathroom, often unable to reach it in time even when it is nearby. Anxiety exacerbates this urgency, sometimes leading to stomach upset. She has had multiple recent accidents, which are distressing and humiliating. She is concerned about the potential for accidents if required to sit for long periods without bathroom access, as would be the case during jury duty.  In addition to bowel issues, she has a history of headaches and visual disturbances, including blurred and double vision, particularly in the evenings. She manages these symptoms by reducing sodium and caffeine intake, elevating her pillow, and attempting weight loss. These symptoms have improved slightly but were severe last year, causing significant brain fog.  She is awaiting further evaluation by a neurologist and a gastroenterologist, with an appointment scheduled for April 30th.   Patient Active Problem List   Diagnosis Date Noted  . Intractable chronic migraine without aura and without status migrainosus 12/05/2023  . Blurred vision 12/05/2023  . History of rectal cancer 12/05/2023  . Situational mixed anxiety and depressive disorder 12/05/2023  . Severe anxiety with panic 10/15/2021  . Atypical  chest pain 10/05/2021  . Swallowing difficulty 10/01/2021  . Shortness of breath 10/01/2021  . Motion sickness 10/01/2021  . Joint pain 10/01/2021  . Hx of seasonal allergies 10/01/2021  . Heart burn 10/01/2021  . Genital lesion, female 10/01/2021  . Complication of anesthesia 10/01/2021  . Colon cancer (HCC) 10/01/2021  . Back pain 10/01/2021  . Atypical syncope 10/01/2021  . Asthma 10/01/2021  . Arthritis 10/01/2021  . Anemia in pregnancy 10/01/2021  . History of kidney stones 11/20/2020  . Umbilical discharge 11/20/2020  . Periumbilical abdominal pain 11/20/2020  . Lower extremity edema 10/25/2019  . 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 OSA (obstructive sleep apnea) 07/24/2019  . Wheezing 07/24/2019  . Atherosclerosis of aorta (HCC) 05/22/2019  . Other fatigue 05/22/2019  . Slow transit constipation 05/22/2019  . Palpitation 05/22/2019  . Gastroesophageal reflux disease 05/22/2019  . Morbid obesity (HCC) 05/22/2019  . Raynaud's disease 05/21/2019  . Diarrhea 05/11/2016  . Dehydration 05/11/2016  . Nausea with vomiting 05/11/2016  . Anxiety 05/11/2016  . Genetic testing 02/19/2016  . Family history of breast cancer   . Family history of uterine cancer   . Rectal cancer (HCC) 12/18/2015   Past Medical History:  Diagnosis Date  . Anemia    during pregnancy, not now.  . 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 Asthma   . Atypical syncope    1 episode -never had follow up with cardiology-never any further episodes, did go to urgent care.  . Back pain   . Colon cancer [facility])    rectal cancer Radiation and oral chemo with radiation.  . Complication of anesthesia   . Constipation   .  Edema, lower extremity   . Family history of breast cancer   . Family history of uterine cancer   . Genital lesion, female    at times  . Heart burn    occ.  Aaron Aas Hx of seasonal allergies   . Joint pain   . Motion sickness   . PONV (postoperative nausea and  vomiting)   . Rectal cancer (HCC)   . Shortness of breath   . Swallowing difficulty   . Umbilicus discharge    Past Surgical History:  Procedure Laterality Date  . 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 CESAREAN SECTION    . COLON SURGERY    . DIAGNOSTIC LAPAROSCOPY     with D and C and Ovarian cystectomy, tubal ligation done at this time '00  . DILATION AND CURETTAGE OF UTERUS    . TUBAL LIGATION    . XI ROBOTIC ASSISTED LOWER ANTERIOR RESECTION N/A 03/21/2016   Procedure: XI ROBOTIC ASSISTED LOWER ANTERIOR RESECTION;  Surgeon: Joyce Nixon, MD;  Location: [facility];  Service: General;  Laterality: N/A;   Social History   Tobacco Use  . Smoking status: Never    Passive exposure: Never  . Smokeless tobacco: Never  Vaping Use  . Vaping status: Never Used  Substance Use Topics  . Alcohol use: No    Alcohol/week: 0.0 standard drinks of alcohol  . Drug use: No   Social History   Socioeconomic History  . Marital status: Legally Separated    Spouse name: Not on file  . Number of children: 3  . Years of education: Not on file  . Highest education level: Not on file  Occupational History  . Occupation: Data processing manager  Tobacco Use  . Smoking status: Never    Passive exposure: Never  . Smokeless tobacco: Never  Vaping Use  . Vaping status: Never Used  Substance and Sexual Activity  . Alcohol use: No    Alcohol/week: 0.0 standard drinks of alcohol  . Drug use: No  . Sexual activity: Not Currently    Birth control/protection: Abstinence  Other Topics Concern  . Not on file  Social History Narrative   No exercise   Social Drivers of Corporate investment banker Strain: Not on file  Food Insecurity: Not on file  Transportation Needs: Not on file  Physical Activity: Not on file  Stress: Not on file  Social Connections: Not on file  Intimate Partner Violence: Not on file   Family Status  Relation Name Status  . Mother  Deceased at age  70       alz dementia  . Father  Deceased at age 15  . Sister  Alive  . Sister  Alive  . MGM  Deceased at age 68  . MGF  Deceased  . PGM  Deceased  . PGF  Deceased  . Mat [employer]  . Cousin  Alive  No partnership data on file   Family History  Problem Relation Age of Onset  . Supraventricular tachycardia Mother   . Alzheimer's disease Mother   . Hypertension Father   . COPD Father   . Alcoholism Father   . Hypertension Sister   . Hypertension Sister   . Breast cancer Maternal Grandmother        dx in her 46s  . Cancer Paternal Grandmother 21       uterine cancer   . Hypertension Paternal Grandfather   .  Heart attack Paternal Grandfather   . Skin cancer Cousin        maternal first cousin   Allergies  Allergen Reactions  . Codeine Nausea Only  . Morphine And Codeine Itching and Nausea Only  . Sucralose Nausea And Vomiting    Fever  . Zolpidem Tartrate     Hallucinations   . Zolpidem Tartrate     Other reaction(s): Other (See Comments) Hallucinations Hallucinations      Review of Systems  Constitutional:  Negative for fever and malaise/fatigue.  HENT:  Negative for congestion.   Eyes:  Negative for blurred vision.  Respiratory:  Negative for cough and shortness of breath.   Cardiovascular:  Negative for chest pain, palpitations and leg swelling.  Gastrointestinal:  Negative for abdominal pain, blood in stool, nausea and vomiting.  Genitourinary:  Negative for dysuria and frequency.  Musculoskeletal:  Negative for back pain and falls.  Skin:  Negative for rash.  Neurological:  Negative for dizziness, loss of consciousness and headaches.  Endo/Heme/Allergies:  Negative for environmental allergies.  Psychiatric/Behavioral:  Negative for depression. The patient is not nervous/anxious.      Objective:     BP 118/72   Pulse 74   Resp 18   Ht 5\' 2"  (1.575 m)   Wt 222 lb (100.7 kg)   SpO2 100%   BMI 40.60 kg/m  BP Readings from Last 3 Encounters:   12/05/23 118/72  06/08/23 112/80  05/25/23 98/70   Wt Readings from Last 3 Encounters:  12/05/23 222 lb (100.7 kg)  06/08/23 218 lb 9.6 oz (99.2 kg)  05/25/23 218 lb 6.4 oz (99.1 kg)   SpO2 Readings from Last 3 Encounters:  12/05/23 100%  06/08/23 98%  05/25/23 99%      Physical Exam Vitals and nursing note reviewed.  Constitutional:      General: She is not in acute distress.    Appearance: Normal appearance. She is well-developed.  HENT:     Head: Normocephalic and atraumatic.  Eyes:     General: No scleral icterus.       Right eye: No discharge.        Left eye: No discharge.  Cardiovascular:     Rate and Rhythm: Normal rate and regular rhythm.     Heart sounds: No murmur heard. Pulmonary:     Effort: Pulmonary effort is normal. No respiratory distress.     Breath sounds: Normal breath sounds.  Musculoskeletal:        General: Normal range of motion.     Cervical back: Normal range of motion and neck supple.     Right lower leg: No edema.     Left lower leg: No edema.  Skin:    General: Skin is warm and dry.  Neurological:     General: No focal deficit present.     Mental Status: She is alert and oriented to person, place, and time.  Psychiatric:        Mood and Affect: Mood normal.        Behavior: Behavior normal.        Thought Content: Thought content normal.        Judgment: Judgment normal.    No results found for any visits on 12/05/23.  Last CBC Lab Results  Component Value Date   WBC 5.9 06/08/2023   HGB 13.0 06/08/2023   HCT 40.3 06/08/2023   MCV 91.6 06/08/2023   MCH 29.8 03/24/2023   RDW 14.2 06/08/2023  PLT 308.0 06/08/2023   Last metabolic panel Lab Results  Component Value Date   GLUCOSE 81 06/08/2023   NA 140 06/08/2023   K 3.8 06/08/2023   CL 101 06/08/2023   CO2 29 06/08/2023   BUN 13 06/08/2023   CREATININE 0.79 06/08/2023   GFR 83.88 06/08/2023   CALCIUM 9.3 06/08/2023   PROT 6.9 06/08/2023   ALBUMIN 4.4 06/08/2023    LABGLOB 2.4 05/18/2020   AGRATIO 2.1 05/18/2020   BILITOT 0.9 06/08/2023   ALKPHOS 74 06/08/2023   AST 16 06/08/2023   ALT 10 06/08/2023   ANIONGAP 8 10/27/2022   Last lipids Lab Results  Component Value Date   CHOL 155 03/01/2022   HDL 54.30 03/01/2022   LDLCALC 88 03/01/2022   TRIG 64.0 03/01/2022   CHOLHDL 3 03/01/2022   Last hemoglobin A1c Lab Results  Component Value Date   HGBA1C 5.2 03/01/2022   Last thyroid functions Lab Results  Component Value Date   TSH 3.07 03/24/2023   T3TOTAL 153 10/29/2019   T4TOTAL 9.6 10/15/2021   Last vitamin D  Lab Results  Component Value Date   VD25OH 34 03/24/2023   Last vitamin B12 and Folate Lab Results  Component Value Date   VITAMINB12 379 03/24/2023      The 10-year ASCVD risk score (Arnett DK, et al., 2019) is: 1.5%    Assessment & Plan:   Problem List Items Addressed This Visit       Unprioritized   Situational mixed anxiety and depressive disorder   Rectal cancer (HCC)   S/p surgery pt has trouble with diarrhea       Intractable chronic migraine without aura and without status migrainosus - Primary   Per neuro      History of rectal cancer   Diarrhea   Due to rectal cancer       Blurred vision   Assessment and Plan Assessment & Plan Rectal cancer with bowel dysfunction   Rectal cancer has led to significant bowel dysfunction following extensive surgical resection. She experiences frequent bowel accidents and urgency, impacting daily activities and her ability to serve on a jury. Concerns about potential public accidents due to lack of bowel control necessitate a doctor's note to excuse her from jury duty.  Visual disturbances   She experiences ongoing visual disturbances, including blurred vision and diplopia, especially in the evenings. Although symptoms have improved since last year, they persist. She should attend the upcoming neurology appointment for further evaluation.   Return in about 3  months (around 03/05/2024), or if symptoms worsen or fail to improve, for annual exam, fasting.    Chloeanne Poteet R Lowne Chase, DO

## 2023-12-21 DIAGNOSIS — R159 Full incontinence of feces: Secondary | ICD-10-CM | POA: Insufficient documentation

## 2023-12-21 DIAGNOSIS — N3946 Mixed incontinence: Secondary | ICD-10-CM | POA: Insufficient documentation

## 2024-02-05 ENCOUNTER — Telehealth: Payer: Self-pay | Admitting: Diagnostic Neuroimaging

## 2024-02-05 NOTE — Telephone Encounter (Signed)
 Patient said not having issues right now. Requesting to schedule appt farther out.

## 2024-02-06 ENCOUNTER — Institutional Professional Consult (permissible substitution): Admitting: Diagnostic Neuroimaging

## 2024-02-27 ENCOUNTER — Emergency Department (HOSPITAL_COMMUNITY)

## 2024-02-27 ENCOUNTER — Encounter (HOSPITAL_COMMUNITY): Payer: Self-pay

## 2024-02-27 ENCOUNTER — Emergency Department (HOSPITAL_COMMUNITY)
Admission: EM | Admit: 2024-02-27 | Discharge: 2024-02-27 | Disposition: A | Discharge status: Home / Self Care | Attending: Emergency Medicine | Admitting: Emergency Medicine

## 2024-02-27 ENCOUNTER — Ambulatory Visit: Payer: Self-pay

## 2024-02-27 ENCOUNTER — Other Ambulatory Visit: Payer: Self-pay

## 2024-02-27 DIAGNOSIS — J45909 Unspecified asthma, uncomplicated: Secondary | ICD-10-CM | POA: Diagnosis not present

## 2024-02-27 DIAGNOSIS — G510 Bell's palsy: Secondary | ICD-10-CM | POA: Diagnosis not present

## 2024-02-27 DIAGNOSIS — R200 Anesthesia of skin: Secondary | ICD-10-CM | POA: Diagnosis present

## 2024-02-27 DIAGNOSIS — Z85038 Personal history of other malignant neoplasm of large intestine: Secondary | ICD-10-CM | POA: Insufficient documentation

## 2024-02-27 DIAGNOSIS — Z7982 Long term (current) use of aspirin: Secondary | ICD-10-CM | POA: Insufficient documentation

## 2024-02-27 LAB — COMPREHENSIVE METABOLIC PANEL WITH GFR
ALT: 15 U/L (ref 0–44)
AST: 21 U/L (ref 15–41)
Albumin: 4.4 g/dL (ref 3.5–5.0)
Alkaline Phosphatase: 80 U/L (ref 38–126)
Anion gap: 10 (ref 5–15)
BUN: 11 mg/dL (ref 6–20)
CO2: 27 mmol/L (ref 22–32)
Calcium: 9.7 mg/dL (ref 8.9–10.3)
Chloride: 103 mmol/L (ref 98–111)
Creatinine, Ser: 0.75 mg/dL (ref 0.44–1.00)
GFR, Estimated: >60 mL/min (ref >60)
Glucose, Bld: 91 mg/dL (ref 70–99)
Potassium: 3.9 mmol/L (ref 3.5–5.1)
Sodium: 140 mmol/L (ref 135–145)
Total Bilirubin: 0.9 mg/dL (ref 0.0–1.2)
Total Protein: 8 g/dL (ref 6.5–8.1)

## 2024-02-27 LAB — DIFFERENTIAL
Abs Immature Granulocytes: 0.01 K/uL (ref 0.00–0.07)
Basophils Absolute: 0 K/uL (ref 0.0–0.1)
Basophils Relative: 1 %
Eosinophils Absolute: 0.1 K/uL (ref 0.0–0.5)
Eosinophils Relative: 1 %
Immature Granulocytes: 0 %
Lymphocytes Relative: 21 %
Lymphs Abs: 1.4 K/uL (ref 0.7–4.0)
Monocytes Absolute: 0.4 K/uL (ref 0.1–1.0)
Monocytes Relative: 6 %
Neutro Abs: 4.7 K/uL (ref 1.7–7.7)
Neutrophils Relative %: 71 %

## 2024-02-27 LAB — CBC
HCT: 41.2 % (ref 36.0–46.0)
Hemoglobin: 13.5 g/dL (ref 12.0–15.0)
MCH: 29.5 pg (ref 26.0–34.0)
MCHC: 32.8 g/dL (ref 30.0–36.0)
MCV: 90.2 fL (ref 80.0–100.0)
Platelets: 281 K/uL (ref 150–400)
RBC: 4.57 MIL/uL (ref 3.87–5.11)
RDW: 13.9 % (ref 11.5–15.5)
WBC: 6.5 K/uL (ref 4.0–10.5)
nRBC: 0 % (ref 0.0–0.2)

## 2024-02-27 LAB — PROTIME-INR
INR: 1 (ref 0.8–1.2)
Prothrombin Time: 13.5 s (ref 11.4–15.2)

## 2024-02-27 MED ORDER — IOHEXOL 350 MG/ML SOLN
75.0000 mL | Freq: Once | INTRAVENOUS | Status: AC | PRN
  Administered 2024-02-27: 75 mL via INTRAVENOUS

## 2024-02-27 MED ORDER — ACYCLOVIR 400 MG PO TABS: 400.0000 mg | ORAL_TABLET | Freq: Every day | ORAL | 0 refills | Status: DC

## 2024-02-27 MED ORDER — PREDNISONE 10 MG PO TABS: 60.0000 mg | ORAL_TABLET | Freq: Every day | ORAL | 0 refills | Status: DC

## 2024-02-27 MED ORDER — KETOROLAC TROMETHAMINE 15 MG/ML IJ SOLN
15.0000 mg | Freq: Once | INTRAMUSCULAR | Status: AC
  Administered 2024-02-27: 15 mg via INTRAVENOUS
  Filled 2024-02-27: qty 1

## 2024-02-27 NOTE — Discharge Instructions (Addendum)
 You were evaluated in the emergency room for right-sided facial numbness and weakness.  Your exam is most consistent with Bell's palsy.  Your lab work and imaging did not show any significant abnormality.  A prescription for prednisone  and an antiviral medication was sent into your pharmacy.  Please take these as prescribed.  Please call the number on the sheet to follow-up with ophthalmology.  You should be receiving a call from neurology to set up an appointment as well.

## 2024-02-27 NOTE — ED Triage Notes (Signed)
 Pt came in for right sided facial droop and numbness that started at 10am. Pt also stated she's had a headache during the weekend and jaw pain on Monday. There's also been ringing in her ear and on and off tingling in the hands.   Tylenol  and advil has been taken for the headache.

## 2024-02-27 NOTE — Telephone Encounter (Signed)
 FYI Only or Action Required?: FYI only for provider.  Patient was last seen in primary care on 12/05/2023 by [name], [name], DO.  Called Nurse Triage reporting Headache.  Symptoms began today.  Interventions attempted: Nothing.  Symptoms are: gradually worsening.  Triage Disposition: Go to ED Now (or PCP Triage)  Patient/caregiver understands and will follow disposition?: Yes      Copied from CRM [id number]. Topic: Clinical - Red Word Triage >> Feb 27, 2024  2:19 PM Kaitlyn Morgan wrote: Red Word that prompted transfer to Nurse Triage: Patient states that half of her face is numb, she has been having headaches, jaw pain and ringing of the ears. Patient states that her throat muscles are also sore, her mouth is numb and dry. Reason for Disposition  Bell's palsy suspected (i.e., weakness on only one side of the face, developing over hours to days, no other symptoms)  Answer Assessment - Initial Assessment Questions 1. SYMPTOM: What is the main symptom you are concerned about? (e.g., weakness, numbness)     Right side facial numbness  2. ONSET: When did this start? (e.g., minutes, hours, days; while sleeping)     3 hours ago, tngling started 3 hours ago   3. LAST NORMAL: When was the last time you (the patient) were normal (no symptoms)?     Woke up feeling fine.   4. PATTERN Does this come and go, or has it been constant since it started?  Is it present now?     Constant  5. CARDIAC SYMPTOMS: Have you had any of the following symptoms: chest pain, difficulty breathing, palpitations?     No  6. NEUROLOGIC SYMPTOMS: Have you had any of the following symptoms: headache, dizziness, vision loss, double vision, changes in speech, unsteady on your feet?     Headache,  7. OTHER SYMPTOMS: Do you have any other symptoms?     Jaw pain, ringing in ear on right ear  8. PREGNANCY: Is there any chance you are pregnant? When was your last menstrual period?     *No  Answer*  Protocols used: Neurologic Deficit-A-AH

## 2024-02-27 NOTE — ED Provider Triage Note (Addendum)
 Emergency Medicine Provider Triage Evaluation Note  Kaitlyn Morgan , a 58 y.o. female  was evaluated in triage.  Pt complains of right-sided facial weakness.  Review of Systems  Positive: Right-sided facial weakness and numbness, prior headache which has resolved Negative: Extremity weakness or numbness, ataxia  Physical Exam  There were no vitals taken for this visit. Gen:   Awake, no distress   Resp:  Normal effort  MSK:   Moves extremities without difficulty  Other:  Right-sided facial palsy which involves forehead, no other neurologic deficits  Medical Decision Making  Medically screening exam initiated at 2:53 PM.  Appropriate orders placed.  Kaitlyn Morgan was informed that the remainder of the evaluation will be completed by another provider, this initial triage assessment does not replace that evaluation, and the importance of remaining in the ED until their evaluation is complete.  Patient presenting for right-sided facial palsy. She has noted intermittent headaches over the weekend.  She noticed a foul taste in her mouth yesterday.  Headache was improved this morning.  Right-sided facial palsy began this morning, 4 hours prior to arrival.  On exam, patient has right-sided facial palsy which involves forehead consistent with Bell's palsy.  Out of precaution, will initiate workup to rule out stroke.   Melvenia Motto, MD 02/27/24 1455    Melvenia Motto, MD 02/27/24 [phone]

## 2024-02-27 NOTE — ED Provider Notes (Signed)
 [place] EMERGENCY DEPARTMENT AT [facility] Provider Note   CSN: [id number] Arrival date & time: 02/27/24  1444     Patient presents with: Facial Droop and Numbness   Kaitlyn Morgan is a 58 y.o. female with a history of migraines presents with complaints of right-sided facial numbness and facial weakness that started this morning around 10 AM.  Notes that she did have a more severe headache over the weekend that has since resolved.  She suspected it was sinus related.  She denies any headache today.  No vision changes.  No difficulty speaking or ambulating.  Denies any extremity weakness or numbness.  No history of stroke.  No recent viral symptoms.   HPI    Past Medical History:  Diagnosis Date   Anemia    during pregnancy, not now.   Arthritis    osteoarthritis-necksome issues with cervical vertebrae   Asthma    Atypical syncope    1 episode -never had follow up with cardiology-never any further episodes, did go to urgent care.   Back pain    Colon cancer (HCC)    rectal cancer Radiation and oral chemo with radiation.   Complication of anesthesia    Constipation    Edema, lower extremity    Family history of breast cancer    Family history of uterine cancer    Genital lesion, female    at times   Heart burn    occ.   Hx of seasonal allergies    Joint pain    Motion sickness    PONV (postoperative nausea and vomiting)    Rectal cancer (HCC)    Shortness of breath    Swallowing difficulty    Umbilicus discharge    Past Surgical History:  Procedure Laterality Date   ABDOMINAL HYSTERECTOMY     atypical cell, hx abnormal pap test   CESAREAN SECTION     COLON SURGERY     DIAGNOSTIC LAPAROSCOPY     with D and C and Ovarian cystectomy, tubal ligation done at this time '00   DILATION AND CURETTAGE OF UTERUS     TUBAL LIGATION     XI ROBOTIC ASSISTED LOWER ANTERIOR RESECTION N/A 03/21/2016   Procedure: XI ROBOTIC ASSISTED LOWER ANTERIOR RESECTION;   Surgeon: Bernarda Ned, MD;  Location: [facility];  Service: General;  Laterality: N/A;     Prior to Admission medications   Medication Sig Start Date End Date Taking? Authorizing Provider  acyclovir  (ZOVIRAX ) 400 MG tablet Take 1 tablet (400 mg total) by mouth 5 (five) times daily. 02/27/24  Yes Donnajean Lynwood DEL, PA-C  predniSONE  (DELTASONE ) 10 MG tablet Take 6 tablets (60 mg total) by mouth daily. 02/27/24  Yes Donnajean Lynwood DEL, PA-C  aspirin  EC (ASPIRIN  LOW DOSE) 81 MG tablet TAKE 1 TABLET(81 MG) BY MOUTH DAILY** CALL DOCTORS OFFICE TO MAKE APPOINTMENT 03/06/23   Krasowski, Robert J, MD  cyclobenzaprine  (FLEXERIL ) 10 MG tablet Take 1 tablet (10 mg total) by mouth 3 (three) times daily as needed for muscle spasms. 06/08/23   Antonio Cyndee Rockers R, DO  estradiol  (VIVELLE -DOT) 0.1 MG/24HR patch APPLY 1 PATCH TOPICALLY TO THE SKIN 2 TIMES A WEEK    [provider]  hydrochlorothiazide  (HYDRODIURIL ) 25 MG tablet TAKE 1 TABLET(25 MG) BY MOUTH DAILY 08/04/23   Antonio Cyndee, Yvonne R, DO  Hyoscyamine Sulfate SL 0.125 MG SUBL Take 0.125 mg by mouth QID. 04/18/17   [provider]  nitrofurantoin , macrocrystal-monohydrate, (MACROBID ) 100 MG  capsule Take 1 capsule (100 mg total) by mouth 2 (two) times daily. 03/24/23   Antonio Cyndee Rockers R, DO  OMEPRAZOLE  PO Take by mouth.    [provider]  phentermine  (ADIPEX-P ) 37.5 MG tablet Take 18.75-37.5 mg by mouth daily. 02/18/22   [provider]  SUMAtriptan  (IMITREX ) 50 MG tablet TAKE 1 TABLET BY MOUTH EVERY 2 HOURS AS NEEDED FOR MIGRAINE. MAY REPEAT IN 2 HOURS IF HEADACHE PERSISTS OR RECURS. MAX 2 TABLETS IN 24 HOURS 07/24/23   Antonio Cyndee Rockers R, DO    Allergies: Codeine, Morphine and codeine, Sucralose, Zolpidem tartrate, and Zolpidem tartrate    Review of Systems  Neurological:  Positive for facial asymmetry.    Updated Vital Signs BP 115/70 (BP Location: Left Arm)   Pulse 71   Temp (!) 97.5 F (36.4 C) (Oral)    Resp 20   SpO2 99%   Physical Exam Vitals and nursing note reviewed.  Constitutional:      General: She is not in acute distress.    Appearance: She is well-developed.  HENT:     Head: Normocephalic and atraumatic.  Eyes:     Conjunctiva/sclera: Conjunctivae normal.  Cardiovascular:     Rate and Rhythm: Normal rate and regular rhythm.     Heart sounds: No murmur heard. Pulmonary:     Effort: Pulmonary effort is normal. No respiratory distress.     Breath sounds: Normal breath sounds.  Musculoskeletal:        General: No swelling.     Cervical back: Neck supple.  Skin:    General: Skin is warm and dry.     Capillary Refill: Capillary refill takes less than 2 seconds.  Neurological:     Mental Status: She is alert.     Comments: Patient is alert and oriented. There is no abnormal phonation.  Right-sided facial droop, involves the forehead. No pronator drift. Moves all extremities spontaneously. 5/5 strength in upper and lower extremities. There is no nystagmus. EOMI, PERRL. Coordination intact with finger to nose and normal ambulation.    Psychiatric:        Mood and Affect: Mood normal.     (all labs ordered are listed, but only abnormal results are displayed) Labs Reviewed  PROTIME-INR  CBC  DIFFERENTIAL  COMPREHENSIVE METABOLIC PANEL WITH GFR    EKG: None  Radiology: MR BRAIN WO CONTRAST Result Date: 02/27/2024 CLINICAL DATA:  Neuro deficit, acute, stroke suspected EXAM: MRI HEAD WITHOUT CONTRAST TECHNIQUE: Multiplanar, multiecho pulse sequences of the brain and surrounding structures were obtained without intravenous contrast. COMPARISON:  MRI of the head dated November 30, 2022. FINDINGS: Brain: Normal brain. No evidence of hemorrhage, mass, cortical infarct or hydrocephalus. Vascular: Normal vascular flow voids. Skull and upper cervical spine: Normal marrow signal. No osseous lesions. Sinuses/Orbits: Clear paranasal sinuses.  Normal orbits. Other: None. IMPRESSION:  Normal brain. Electronically Signed   By: Evalene Coho M.D.   On: 02/27/2024 17:49   CT ANGIO HEAD NECK W WO CM Result Date: 02/27/2024 CLINICAL DATA:  Neuro deficit, acute, stroke suspected EXAM: CT ANGIOGRAPHY HEAD AND NECK WITH AND WITHOUT CONTRAST TECHNIQUE: Multidetector CT imaging of the head and neck was performed using the standard protocol during bolus administration of intravenous contrast. Multiplanar CT image reconstructions and MIPs were obtained to evaluate the vascular anatomy. Carotid stenosis measurements (when applicable) are obtained utilizing NASCET criteria, using the distal internal carotid diameter as the denominator. RADIATION DOSE REDUCTION: This exam was performed according to  the departmental dose-optimization program which includes automated exposure control, adjustment of the mA and/or kV according to patient size and/or use of iterative reconstruction technique. CONTRAST:  75mL OMNIPAQUE  IOHEXOL  350 MG/ML SOLN COMPARISON:  CT the head dated October 27, 2022. FINDINGS: CT HEAD FINDINGS Brain: Normal brain. No evidence of hemorrhage, mass, cortical infarct or hydrocephalus. Vascular: Negative. Skull: Intact and unremarkable. Sinuses/Orbits: Clear paranasal sinuses.  Normal orbits. Other: None. Review of the MIP images confirms the above findings CTA NECK FINDINGS Aortic arch: Normal to the extent demonstrated. Standard 3 vessel branching. The brachiocephalic artery is widely patent. Right carotid system: Normal caliber and appearance of the common carotid and internal carotid arteries. Left carotid system: Normal caliber and appearance of the common carotid and internal carotid arteries. Vertebral arteries: The left vertebral artery is dominant and the right is relatively hypoplastic. Skeleton: Unremarkable. Other neck: Scattered shotty cervical lymph nodes. Upper chest: Clear lung apices. Review of the MIP images confirms the above findings CTA HEAD FINDINGS Anterior circulation:  The internal carotid arteries, anterior and middle cerebral arteries and their proximal branches are normal in caliber. No evidence of aneurysm or flow-limiting stenosis. There is fetal type origin of the left posterior cerebral artery. Posterior circulation: The vertebrobasilar system is unremarkable. The left P1 segment is diminutive, but patent. The P2 segment is normal in caliber. The right posterior cerebral artery and the cerebellar arteries are normal in caliber and unremarkable in appearance. Venous sinuses: Patent. Anatomic variants: Fetal type origin of the left posterior cerebral artery. Review of the MIP images confirms the above findings IMPRESSION: 1. Normal brain. 2. Normal CT angiogram of the neck. 3. Normal variant cerebral artery anatomy. Electronically Signed   By: Evalene Coho M.D.   On: 02/27/2024 17:47     Procedures   Medications Ordered in the ED  ketorolac  (TORADOL ) 15 MG/ML injection 15 mg (has no administration in time range)  iohexol  (OMNIPAQUE ) 350 MG/ML injection 75 mL (75 mLs Intravenous Contrast Given 02/27/24 1607)                                    Medical Decision Making  This patient presents to the ED with chief complaint(s) of facial numbness.  The complaint involves an extensive differential diagnosis and also carries with it a high risk of complications and morbidity.   Pertinent past medical history as listed in HPI  The differential diagnosis includes  Bell's palsy, CVA, TIA, Ramsay Hunt syndrome Additional history obtained: Records reviewed Care Everywhere/External Records  Assessment and management:   Nontoxic-appearing patient presents with complaints of right-sided facial weakness and numbness that started this morning around 10 AM.  Endorses right-sided tinnitus and metallic taste in her mouth.  Not associated with any vision changes or extremity weakness or numbness.  On exam patient has sensation deficits noted to the right side of the face  with associated right-sided facial droop, does not spare the forehead.  Ear exam is unremarkable.  Overall exam is most consistent with Bell's palsy.  She has no history of CVA, nor significant risk factors.  She, he does get migraines, however within the past few days she reports that she has had severe 10 out of 10 headaches intermittently.  Given her age and recent severe headaches will obtain imaging to rule out any central process.  Independent ECG interpretation:  Sinus rhythm, abnormal R wave progression  Independent labs interpretation:  The following labs were independently interpreted:  CBC unremarkable, PT INR within normal limits  Independent visualization and interpretation of imaging: I independently visualized the following imaging with scope of interpretation limited to determining acute life threatening conditions related to emergency care:  CT angio head and neck MRI brain without contrast   Consultations obtained:   none  Disposition:   Patient will be discharged home. The patient has been appropriately medically screened and/or stabilized in the ED. I have low suspicion for any other emergent medical condition which would require further screening, evaluation or treatment in the ED or require inpatient management. At time of discharge the patient is hemodynamically stable and in no acute distress. I have discussed work-up results and diagnosis with patient and answered all questions. Patient is agreeable with discharge plan. We discussed strict return precautions for returning to the emergency department and they verbalized understanding.     Social Determinants of Health:   none  This note was dictated with voice recognition software.  Despite best efforts at proofreading, errors may have occurred which can change the documentation meaning.       Final diagnoses:  Bell's palsy    ED Discharge Orders          Ordered    predniSONE  (DELTASONE ) 10 MG tablet   Daily        02/27/24 1818    acyclovir  (ZOVIRAX ) 400 MG tablet  5 times daily        02/27/24 1818    Ambulatory referral to Neurology       Comments: An appointment is requested in approximately: 1 week   02/27/24 1818               Donnajean Lynwood DEL, PA-C 02/27/24 1819    Emil Share, DO 02/27/24 1835

## 2024-02-27 NOTE — Telephone Encounter (Signed)
FYI. Pt going to ED.  

## 2024-02-28 ENCOUNTER — Encounter: Payer: Self-pay | Admitting: Family Medicine

## 2024-02-28 ENCOUNTER — Ambulatory Visit: Payer: Self-pay

## 2024-02-28 NOTE — Telephone Encounter (Signed)
 FYI Only or Action Required?: FYI only for provider.  Patient was last seen in primary care on 12/05/2023 by [name], [name], DO.  Called Nurse Triage reporting Medication Assistance.  Symptoms began yesterday.  Interventions attempted: Prescription medications: Prednisone  and Acyclovir  .  Symptoms are: stable.  Triage Disposition: Information or Advice Only Call  Patient/caregiver understands and will follow disposition?: Yes  Pt concerned about directions for meds from ED visit 02/27/24. Pt also having issues with eye not shutting all the way and dry and irritated. Advised to use OTC lubricant drops and eye will take time as well as other sx. Scheduled HFU with Leita Elbe, NP on 03/01/24  Copied from CRM [id number]. Topic: Clinical - Red Word Triage >> Feb 28, 2024  8:48 AM Donna BRAVO wrote: Red Word that prompted transfer to Nurse Triage:  Patient calling, patient in ED 02/27/24 diagnosed with Bell's Palsy right side, still have headache, ringing in ear, eye wont close or blink good,and has extra anxiety, patient has questions about medication that hospital prescribed. Reason for Disposition  Caller has medicine question only, adult not sick, AND triager answers question  Answer Assessment - Initial Assessment Questions 1. NAME of MEDICINE: What medicine(s) are you calling about?     Prednisone  and acyclovir   2. QUESTION: What is your question? (e.g., double dose of medicine, side effect)     Wanting to know directions for medications since directions on AVS were broad 3. PRESCRIBER: Who prescribed the medicine? Reason: if prescribed by specialist, call should be referred to that group.     ED provider  4. SYMPTOMS: Do you have any symptoms? If Yes, ask: What symptoms are you having?  How bad are the symptoms (e.g., mild, moderate, severe)     Facial paralysis, d/t Bell's palsy  Protocols used: Medication Question Call-A-AH

## 2024-03-01 ENCOUNTER — Encounter: Payer: Self-pay | Admitting: Family

## 2024-03-01 ENCOUNTER — Ambulatory Visit (INDEPENDENT_AMBULATORY_CARE_PROVIDER_SITE_OTHER): Admitting: Family

## 2024-03-01 VITALS — BP 122/84 | HR 73 | Ht 62.0 in | Wt 224.0 lb

## 2024-03-01 DIAGNOSIS — G510 Bell's palsy: Secondary | ICD-10-CM

## 2024-03-01 NOTE — Progress Notes (Signed)
 Kaitlyn Morgan is a 58 y.o. female with the following history as recorded in Epic[employer]:  Patient Active Problem List   Diagnosis Date Noted   Intractable chronic migraine without aura and without status migrainosus 12/05/2023   Blurred vision 12/05/2023   History of rectal cancer 12/05/2023   Situational mixed anxiety and depressive disorder 12/05/2023   Severe anxiety with panic 10/15/2021   Atypical chest pain 10/05/2021   Swallowing difficulty 10/01/2021   Shortness of breath 10/01/2021   Motion sickness 10/01/2021   Joint pain 10/01/2021   Hx of seasonal allergies 10/01/2021   Heart burn 10/01/2021   Genital lesion, female 10/01/2021   Complication of anesthesia 10/01/2021   Colon cancer (HCC) 10/01/2021   Back pain 10/01/2021   Atypical syncope 10/01/2021   Asthma 10/01/2021   Arthritis 10/01/2021   Anemia in pregnancy 10/01/2021   History of kidney stones 11/20/2020   Umbilical discharge 11/20/2020   Periumbilical abdominal pain 11/20/2020   Lower extremity edema 10/25/2019   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   OSA (obstructive sleep apnea) 07/24/2019   Wheezing 07/24/2019   Atherosclerosis of aorta (HCC) 05/22/2019   Other fatigue 05/22/2019   Slow transit constipation 05/22/2019   Palpitation 05/22/2019   Gastroesophageal reflux disease 05/22/2019   Morbid obesity (HCC) 05/22/2019   Raynaud's disease 05/21/2019   Diarrhea 05/11/2016   Dehydration 05/11/2016   Nausea with vomiting 05/11/2016   Anxiety 05/11/2016   Genetic testing 02/19/2016   Family history of breast cancer    Family history of uterine cancer    Rectal cancer (HCC) 12/18/2015    Current Outpatient Medications  Medication Sig Dispense Refill   acyclovir  (ZOVIRAX ) 400 MG tablet Take 1 tablet (400 mg total) by mouth 5 (five) times daily. 35 tablet 0   aspirin  EC (ASPIRIN  LOW DOSE) 81 MG tablet TAKE 1 TABLET(81 MG) BY MOUTH DAILY** CALL DOCTORS OFFICE TO MAKE APPOINTMENT 15 tablet 0   erythromycin  ophthalmic ointment Place 1 Application into the right eye 3 (three) times daily.     estradiol  (VIVELLE -DOT) 0.1 MG/24HR patch APPLY 1 PATCH TOPICALLY TO THE SKIN 2 TIMES A WEEK     hydrochlorothiazide  (HYDRODIURIL ) 25 MG tablet TAKE 1 TABLET(25 MG) BY MOUTH DAILY 90 tablet 1   OMEPRAZOLE  PO Take by mouth.     Polyethyl Glycol-Propyl Glycol (SYSTANE) 0.4-0.3 % GEL ophthalmic gel Place 1 Application into the right eye daily.     predniSONE  (DELTASONE ) 10 MG tablet Take 6 tablets (60 mg total) by mouth daily. 42 tablet 0   SUMAtriptan  (IMITREX ) 50 MG tablet TAKE 1 TABLET BY MOUTH EVERY 2 HOURS AS NEEDED FOR MIGRAINE. MAY REPEAT IN 2 HOURS IF HEADACHE PERSISTS OR RECURS. MAX 2 TABLETS IN 24 HOURS 10 tablet 2   No current facility-administered medications for this visit.    Allergies: Codeine, Morphine and codeine, Sucralose, Zolpidem tartrate, and Zolpidem tartrate  Past Medical History:  Diagnosis Date   Anemia    during pregnancy, not now.   Arthritis    osteoarthritis-necksome issues with cervical vertebrae   Asthma    Atypical syncope    1 episode -never had follow up with cardiology-never any further episodes, did go to urgent care.   Back pain    Colon cancer (HCC)    rectal cancer Radiation and oral chemo with radiation.   Complication of anesthesia    Constipation    Edema, lower extremity    Family history of breast cancer    Family history of  uterine cancer    Genital lesion, female    at times   Heart burn    occ.   Hx of seasonal allergies    Joint pain    Motion sickness    PONV (postoperative nausea and vomiting)    Rectal cancer (HCC)    Shortness of breath    Swallowing difficulty    Umbilicus discharge     Past Surgical History:  Procedure Laterality Date   ABDOMINAL HYSTERECTOMY     atypical cell, hx abnormal pap test   CESAREAN SECTION     COLON SURGERY     DIAGNOSTIC LAPAROSCOPY     with D and C and Ovarian cystectomy, tubal ligation done at this  time '00   DILATION AND CURETTAGE OF UTERUS     TUBAL LIGATION     XI ROBOTIC ASSISTED LOWER ANTERIOR RESECTION N/A 03/21/2016   Procedure: XI ROBOTIC ASSISTED LOWER ANTERIOR RESECTION;  Surgeon: Bernarda Ned, MD;  Location: [facility];  Service: General;  Laterality: N/A;    Family History  Problem Relation Age of Onset   Supraventricular tachycardia Mother    Alzheimer's disease Mother    Hypertension Father    COPD Father    Alcoholism Father    Hypertension Sister    Hypertension Sister    Breast cancer Maternal Grandmother        dx in her 37s   Cancer Paternal Grandmother 44       uterine cancer    Hypertension Paternal Grandfather    Heart attack Paternal Grandfather    Skin cancer Cousin        maternal first cousin    Social History   Tobacco Use   Smoking status: Never    Passive exposure: Never   Smokeless tobacco: Never  Substance Use Topics   Alcohol use: No    Alcohol/week: 0.0 standard drinks of alcohol    Subjective:   Patient was seen at the ER earlier this week- diagnosed with Bell's Palsy; had normal brain MRI and CT angio head/ neck; was appropriately started on anti-virals and prednisone ; was recommend to follow up with neurology within a week- is already an established patient with [facility]; has already seen ophthalmology ( Dr. Octavia) and there are concerns that eyes are actually getting worse- has a corneal abrasion- will be going back there on Monday;   Objective:  Vitals:   03/01/24 1348  BP: 122/84  Pulse: 73  SpO2: 99%  Weight: 224 lb (101.6 kg)  Height: 5' 2 (1.575 m)    General: Well developed, well nourished, in no acute distress  Skin : Warm and dry.  Head: Normocephalic and atraumatic  Eyes: Sclera and conjunctiva clear; right eye is covered with paper tape; Ears: External normal; canals clear; tympanic membranes normal  Oropharynx: Pink, supple. No suspicious lesions  Neck: Supple without thyromegaly, adenopathy  Lungs:  Respirations unlabored; clear to auscultation bilaterally without wheeze, rales, rhonchi  CVS exam: normal rate and regular rhythm.  Neurologic: Alert and oriented; speech intact; face symmetrical; moves all extremities well; CNII-XII intact without focal deficit   Assessment:  1. Bell's palsy    Plan:  Imaging at ER was reassuring; patient is already on prednisone  and anti-viral; keep planned follow up with ophthalmology; emergent referral to neurology ( already established patient at [facility]); she can follow up with her PCP in 1 week for re-check.   Return in about 1 week (around 03/08/2024) for Dr. Antonio.  Orders Placed This Encounter  Procedures   Ambulatory referral to Neurology    Referral Priority:   Emergency    Referral Type:   Consultation    Referral Reason:   Specialty Services Required    Requested Specialty:   Neurology    Number of Visits Requested:   1    Requested Prescriptions    No prescriptions requested or ordered in this encounter

## 2024-03-07 ENCOUNTER — Ambulatory Visit: Payer: Self-pay

## 2024-03-07 NOTE — Telephone Encounter (Signed)
 FYI Only or Action Required?: Action required by provider: would like to know if she can take anything else besides advil.  States has taken the prednisone .  States pain is back in jaw  Patient was last seen in primary care on 03/01/2024 by Jason Leita Repine, FNP.  Called Nurse Triage reporting Jaw Pain.  Symptoms began several weeks ago.  Interventions attempted: Nothing.  Symptoms are: gradually worsening.  Triage Disposition: See HCP Within 4 Hours (Or PCP Triage)  Patient/caregiver understands and will follow disposition?: No, wishes to speak with PCP   Declined appointment.       Copied from CRM [id number]. Topic: Clinical - Red Word Triage >> Mar 07, 2024  8:27 AM Thersia BROCKS wrote: Red Word that prompted transfer to Nurse Triage: patient stated she started radiating with pain in her jaw on the side that she was diagnosed with bells palsy. Patient is in alot of pain . Stated she started taking advil and that helped alil wanted to know what she needs to do Reason for Disposition  [1] SEVERE pain (e.g., excruciating) AND [2] not improved after 2 hours of pain medicine  Answer Assessment - Initial Assessment Questions 1. ONSET: When did the pain start? (e.g., minutes, hours, days)     Recent dx bells palsy; started July 4. 2. ONSET: Does the pain come and go, or has it been constant since it started? (e.g., constant, intermittent, fleeting)     constant 3. SEVERITY: How bad is the pain? (Scale 1-10; mild, moderate or severe)     severe 4. LOCATION: Where does it hurt?      jaw 5. RASH: Is there any redness, rash, or swelling of your face?     no 6. FEVER: Do you have a fever? If Yes, ask: What is it, how was it measured, and when did it start?      no 7. OTHER SYMPTOMS: Do you have any other symptoms? (e.g., fever, toothache, nasal discharge, nasal congestion, clicking sensation in jaw joint)     denies 8. PREGNANCY: Is there any chance you are  pregnant? When was your last menstrual period?     na  Protocols used: Face Pain-A-AH

## 2024-03-08 ENCOUNTER — Encounter: Payer: Self-pay | Admitting: Family Medicine

## 2024-03-08 NOTE — Telephone Encounter (Signed)
Pt called. LDVM of below information

## 2024-03-11 ENCOUNTER — Other Ambulatory Visit: Payer: Self-pay | Admitting: Family

## 2024-03-11 MED ORDER — TRAMADOL HCL 50 MG PO TABS: 50.0000 mg | ORAL_TABLET | Freq: Three times a day (TID) | ORAL | 0 refills | Status: AC | PRN

## 2024-03-12 ENCOUNTER — Encounter: Payer: Self-pay | Admitting: Family Medicine

## 2024-03-12 ENCOUNTER — Ambulatory Visit (INDEPENDENT_AMBULATORY_CARE_PROVIDER_SITE_OTHER): Admitting: Family Medicine

## 2024-03-12 VITALS — BP 100/60 | HR 74 | Temp 97.8°F | Ht 62.0 in | Wt 216.8 lb

## 2024-03-12 DIAGNOSIS — G510 Bell's palsy: Secondary | ICD-10-CM | POA: Diagnosis not present

## 2024-03-12 MED ORDER — VALACYCLOVIR HCL 1 G PO TABS: 1000.0000 mg | ORAL_TABLET | Freq: Two times a day (BID) | ORAL | 2 refills | Status: DC

## 2024-03-12 NOTE — Progress Notes (Signed)
 Subjective:    Patient ID: Kaitlyn Morgan, female    DOB: [birth year], 58 y.o.   MRN: [id number]  Chief Complaint  Patient presents with   Follow-up    1wk for bells palsy;  still having pain and don't understand why after prednisone  and antiviral meds finished last week; on azithromycin  for eye this week    HPI Patient is in today for f/u bells palsy.  Discussed the use of AI scribe software for clinical note transcription with the patient, who gave verbal consent to proceed.  History of Present Illness Kaitlyn Morgan is a 58 year old female who presents with Bell's palsy.  She has been experiencing left-sided facial droop, loss of taste, and a metallic taste in her mouth. The symptoms began with a headache behind her eyes, progressing to jaw pain and a tingling sensation in her lips. She also experienced a sore throat and a severe headache that radiated across her head.  She was initially treated with Valacyclovir  and prednisone , which she started the night she returned from the hospital. She completed the prednisone  course last week. Despite this, she continues to experience pain, which she manages with Advil and Tylenol . She has also been prescribed tramadol  but has not yet taken it. She uses a heating pad to alleviate pain on the affected side.  Her eye requires constant moisture due to dryness, necessitating the use of paper tape to keep it closed and the application of erythromycin gel and eye drops. She remains cautious about keeping her eye closed at night.  She recalls a previous COVID-19 infection, after which she never fully regained her sense of smell and taste. She also mentions a history of HSV-2, though she has not had an outbreak in over a decade. She has been prescribed antiviral medication for potential outbreaks in the past.  She has been experiencing ringing in her ears and notes that her symptoms worsened after completing the prednisone  course. She has a history of  headaches, which she managed by avoiding caffeine and monitoring sodium intake. She has not had a recent tick bite but mentions her dog was previously diagnosed with ehrlichiosis, and she has been caring for feral cats.    Past Medical History:  Diagnosis Date   Anemia    during pregnancy, not now.   Arthritis    osteoarthritis-necksome issues with cervical vertebrae   Asthma    Atypical syncope    1 episode -never had follow up with cardiology-never any further episodes, did go to urgent care.   Back pain    Colon cancer (HCC)    rectal cancer Radiation and oral chemo with radiation.   Complication of anesthesia    Constipation    Edema, lower extremity    Family history of breast cancer    Family history of uterine cancer    Genital lesion, female    at times   Heart burn    occ.   Hx of seasonal allergies    Joint pain    Motion sickness    PONV (postoperative nausea and vomiting)    Rectal cancer (HCC)    Shortness of breath    Swallowing difficulty    Umbilicus discharge     Past Surgical History:  Procedure Laterality Date   ABDOMINAL HYSTERECTOMY     atypical cell, hx abnormal pap test   CESAREAN SECTION     COLON SURGERY     DIAGNOSTIC LAPAROSCOPY     with D and C  and Ovarian cystectomy, tubal ligation done at this time '00   DILATION AND CURETTAGE OF UTERUS     TUBAL LIGATION     XI ROBOTIC ASSISTED LOWER ANTERIOR RESECTION N/A 03/21/2016   Procedure: XI ROBOTIC ASSISTED LOWER ANTERIOR RESECTION;  Surgeon: Bernarda Ned, MD;  Location: [facility];  Service: General;  Laterality: N/A;    Family History  Problem Relation Age of Onset   Supraventricular tachycardia Mother    Alzheimer's disease Mother    Hypertension Father    COPD Father    Alcoholism Father    Hypertension Sister    Hypertension Sister    Breast cancer Maternal Grandmother        dx in her 69s   Cancer Paternal Grandmother 40       uterine cancer    Hypertension Paternal Grandfather     Heart attack Paternal Grandfather    Skin cancer Cousin        maternal first cousin    Social History   Socioeconomic History   Marital status: Legally Separated    Spouse name: Not on file   Number of children: 3   Years of education: Not on file   Highest education level: Not on file  Occupational History   Occupation: Data processing manager  Tobacco Use   Smoking status: Never    Passive exposure: Never   Smokeless tobacco: Never  Vaping Use   Vaping status: Never Used  Substance and Sexual Activity   Alcohol use: No    Alcohol/week: 0.0 standard drinks of alcohol   Drug use: No   Sexual activity: Not Currently    Birth control/protection: Abstinence  Other Topics Concern   Not on file  Social History Narrative   No exercise   Social Drivers of Corporate investment banker Strain: Not on file  Food Insecurity: Not on file  Transportation Needs: Not on file  Physical Activity: Not on file  Stress: Not on file  Social Connections: Not on file  Intimate Partner Violence: Not on file    Outpatient Medications Prior to Visit  Medication Sig Dispense Refill   acyclovir  (ZOVIRAX ) 400 MG tablet Take 1 tablet (400 mg total) by mouth 5 (five) times daily. 35 tablet 0   aspirin  EC (ASPIRIN  LOW DOSE) 81 MG tablet TAKE 1 TABLET(81 MG) BY MOUTH DAILY** CALL DOCTORS OFFICE TO MAKE APPOINTMENT 15 tablet 0   erythromycin ophthalmic ointment Place 1 Application into the right eye 3 (three) times daily.     estradiol  (VIVELLE -DOT) 0.1 MG/24HR patch APPLY 1 PATCH TOPICALLY TO THE SKIN 2 TIMES A WEEK     hydrochlorothiazide  (HYDRODIURIL ) 25 MG tablet TAKE 1 TABLET(25 MG) BY MOUTH DAILY 90 tablet 1   OMEPRAZOLE  PO Take by mouth.     Polyethyl Glycol-Propyl Glycol (SYSTANE) 0.4-0.3 % GEL ophthalmic gel Place 1 Application into the right eye daily.     predniSONE  (DELTASONE ) 10 MG tablet Take 6 tablets (60 mg total) by mouth daily. 42 tablet 0   SUMAtriptan  (IMITREX ) 50 MG tablet  TAKE 1 TABLET BY MOUTH EVERY 2 HOURS AS NEEDED FOR MIGRAINE. MAY REPEAT IN 2 HOURS IF HEADACHE PERSISTS OR RECURS. MAX 2 TABLETS IN 24 HOURS (Patient taking differently: Take 50 mg by mouth every 2 (two) hours as needed for migraine.) 10 tablet 2   traMADol  (ULTRAM ) 50 MG tablet Take 1 tablet (50 mg total) by mouth every 8 (eight) hours as needed for up to 5 days. 15 tablet  0   No facility-administered medications prior to visit.    Allergies  Allergen Reactions   Codeine Nausea Only   Morphine And Codeine Itching and Nausea Only   Sucralose Nausea And Vomiting    Fever   Zolpidem Tartrate     Hallucinations    Zolpidem Tartrate     Other reaction(s): Other (See Comments) Hallucinations Hallucinations    Review of Systems  Constitutional:  Negative for fever and malaise/fatigue.  HENT:  Positive for ear pain and tinnitus. Negative for congestion.   Eyes:  Negative for blurred vision.  Respiratory:  Negative for shortness of breath.   Cardiovascular:  Negative for chest pain, palpitations and leg swelling.  Gastrointestinal:  Negative for abdominal pain, blood in stool and nausea.  Genitourinary:  Negative for dysuria and frequency.  Musculoskeletal:  Negative for falls.  Skin:  Negative for rash.  Neurological:  Positive for tingling. Negative for dizziness, loss of consciousness and headaches.  Endo/Heme/Allergies:  Negative for environmental allergies.  Psychiatric/Behavioral:  Negative for depression. The patient is not nervous/anxious.        Objective:    Physical Exam Vitals and nursing note reviewed.  Constitutional:      General: She is not in acute distress.    Appearance: Normal appearance. She is well-developed.  HENT:     Head: Normocephalic and atraumatic.  Eyes:     General: No scleral icterus.       Right eye: No discharge.        Left eye: No discharge.  Cardiovascular:     Rate and Rhythm: Normal rate and regular rhythm.     Heart sounds: No murmur  heard. Pulmonary:     Effort: Pulmonary effort is normal. No respiratory distress.     Breath sounds: Normal breath sounds.  Musculoskeletal:        General: Normal range of motion.     Cervical back: Normal range of motion and neck supple.     Right lower leg: No edema.     Left lower leg: No edema.  Skin:    General: Skin is warm and dry.  Neurological:     Mental Status: She is alert and oriented to person, place, and time.     Cranial Nerves: Facial asymmetry present.     Comments: R sided ptosis and facial droop  There is improvement R eyelid--- she is able to close her eyelid   Psychiatric:        Mood and Affect: Mood normal.        Behavior: Behavior normal.        Thought Content: Thought content normal.        Judgment: Judgment normal.     BP 100/60   Pulse 74   Temp 97.8 F (36.6 C)   Ht 5' 2 (1.575 m)   Wt 216 lb 12.8 oz (98.3 kg)   SpO2 97%   BMI 39.65 kg/m  Wt Readings from Last 3 Encounters:  03/12/24 216 lb 12.8 oz (98.3 kg)  03/01/24 224 lb (101.6 kg)  12/05/23 222 lb (100.7 kg)    Diabetic Foot Exam - Simple   No data filed    Lab Results  Component Value Date   WBC 6.5 02/27/2024   HGB 13.5 02/27/2024   HCT 41.2 02/27/2024   PLT 281 02/27/2024   GLUCOSE 91 02/27/2024   CHOL 155 03/01/2022   TRIG 64.0 03/01/2022   HDL 54.30 03/01/2022   LDLCALC 88  03/01/2022   ALT 15 02/27/2024   AST 21 02/27/2024   NA 140 02/27/2024   K 3.9 02/27/2024   CL 103 02/27/2024   CREATININE 0.75 02/27/2024   BUN 11 02/27/2024   CO2 27 02/27/2024   TSH 3.07 03/24/2023   INR 1.0 02/27/2024   HGBA1C 5.2 03/01/2022    Lab Results  Component Value Date   TSH 3.07 03/24/2023   Lab Results  Component Value Date   WBC 6.5 02/27/2024   HGB 13.5 02/27/2024   HCT 41.2 02/27/2024   MCV 90.2 02/27/2024   PLT 281 02/27/2024   Lab Results  Component Value Date   NA 140 02/27/2024   K 3.9 02/27/2024   CHLORIDE 106 04/19/2017   CO2 27 02/27/2024    GLUCOSE 91 02/27/2024   BUN 11 02/27/2024   CREATININE 0.75 02/27/2024   BILITOT 0.9 02/27/2024   ALKPHOS 80 02/27/2024   AST 21 02/27/2024   ALT 15 02/27/2024   PROT 8.0 02/27/2024   ALBUMIN 4.4 02/27/2024   CALCIUM 9.7 02/27/2024   ANIONGAP 10 02/27/2024   EGFR 82 (L) 04/19/2017   GFR 83.88 06/08/2023   Lab Results  Component Value Date   CHOL 155 03/01/2022   Lab Results  Component Value Date   HDL 54.30 03/01/2022   Lab Results  Component Value Date   LDLCALC 88 03/01/2022   Lab Results  Component Value Date   TRIG 64.0 03/01/2022   Lab Results  Component Value Date   CHOLHDL 3 03/01/2022   Lab Results  Component Value Date   HGBA1C 5.2 03/01/2022       Assessment & Plan:  Bell's palsy -     Ehrlichia antibody panel; Future -     Lyme Disease Serology w/Reflex; Future -     Rocky mtn spotted fvr abs pnl(IgG+IgM); Future  Other orders -     valACYclovir  HCl; Take 1 tablet (1,000 mg total) by mouth 2 (two) times daily.  Dispense: 15 tablet; Refill: 2  Assessment and Plan Assessment & Plan Bell's palsy, left side   Bell's palsy on the left side presents with facial droop, jaw pain, and metallic taste, following a period of headache and sore throat. Treatment with Valacyclovir  and prednisone  was started, but pain continues. Viral etiology, possibly HSV or shingles, is considered, with no stroke evidence. She is concerned about recurrence and life impact. Continue Valacyclovir  for 3 more days. Manage pain with Advil and Tylenol , using tramadol  as needed. Follow up with neurology for further evaluation and potential physical therapy referral. Check for tick-borne illnesses with a blood test.  Neuropathic facial pain, left side   Neuropathic facial pain on the left side is associated with Bell's palsy. Pain is less severe but persists. Manage with Advil and Tylenol , using tramadol  as needed. Consider ice or heat application for relief.  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 Frequent lubrication is necessary to prevent dryness and corneal damage. Continue erythromycin gel and lubricating eye drops. Keep the eye taped to prevent dryness.  Depression   Depression is related to the emotional impact of Bell's palsy and symptoms. She is concerned about recurrence and life impact.    Zlatan Hornback R Lowne Chase, DO

## 2024-03-13 ENCOUNTER — Ambulatory Visit (INDEPENDENT_AMBULATORY_CARE_PROVIDER_SITE_OTHER): Admitting: Diagnostic Neuroimaging

## 2024-03-13 ENCOUNTER — Encounter: Payer: Self-pay | Admitting: Diagnostic Neuroimaging

## 2024-03-13 ENCOUNTER — Other Ambulatory Visit (INDEPENDENT_AMBULATORY_CARE_PROVIDER_SITE_OTHER)

## 2024-03-13 VITALS — BP 124/80 | HR 62 | Ht 62.0 in | Wt 217.4 lb

## 2024-03-13 DIAGNOSIS — G510 Bell's palsy: Secondary | ICD-10-CM | POA: Diagnosis not present

## 2024-03-13 NOTE — Patient Instructions (Signed)
  RIGHT BELL'S PALSY (likely post-viral) - continue supportive care; eye drops and eye patching - completed prednisone  and valacyclovir  - if not improving in next 4 weeks, then consider repeat MRI brain / IAC w/wo

## 2024-03-13 NOTE — Progress Notes (Signed)
 [facility]  PATIENT: Kaitlyn Morgan DOB: 1965/12/09  REFERRING CLINICIAN: Antonio Cyndee Jamee Morgan, * HISTORY FROM: patient  REASON FOR VISIT: new consult    HISTORICAL  CHIEF COMPLAINT:  Chief Complaint  Patient presents with   Facial Droop    RM 6, Pt alone. Pt referred here d/t Bells Palsy. Pt was seen here in 2024 for Double/blurred vision. Pt states HA started 7/12 and had inflammation all over. HA became really bad behind her eyes. Pt had tinnitus, lost her sense of taste except for a metallic taste, had pain in both jaws, was taking Advil for pain. Went to ED on 7/15 because she felt a pulling/tingling sensation on the right side of her face. Pt states she has some stressful situations recently.    HISTORY OF PRESENT ILLNESS:   UPDATE (03/13/24, VRP): Since last visit, doing better until 02/24/24. Had HA, right ear ringing, jaw pain, sore throat, metallic taste. Then noted right facial weakness on 02/27/24. Went to ER, and dx'd with right bell's palsy. Had prednisone  and valacyclovir .   PRIOR HPI (11/22/22, VRP): 58 year old female here for evaluation of headaches.  Since February 2024 patient has had a new onset of headache and pain in her eyes, back of her head and temples.  Symptoms associated with double vision blurry vision.  Also having brain fog, eye pain and nausea.  Having some sensitivity to light and sound.  Also having some issues with low back pain, right knee pain, joint pain.  She also had some episodes of weakness lasting for few days at a time.  She had episode of unilateral right-sided numbness in 2020 that lasted for several hours.  Has history of rectal cancer in 2017 status post treatment.  Has had some additional generalized issues since that time including weight gain brain fog.  Has been to eye doctor recently for evaluation and no disc edema was noted on 11/04/2022.   REVIEW OF SYSTEMS: Full 14 system review of systems performed and negative  with exception of: as per HPI.  ALLERGIES: Allergies  Allergen Reactions   Codeine Nausea Only   Morphine And Codeine Itching and Nausea Only   Sucralose Nausea And Vomiting    Fever   Zolpidem Tartrate     Hallucinations    Zolpidem Tartrate     Other reaction(s): Other (See Comments) Hallucinations Hallucinations    HOME MEDICATIONS: Outpatient Medications Prior to Visit  Medication Sig Dispense Refill   acetaminophen  (TYLENOL ) 500 MG tablet Take 1,000 mg by mouth every 6 (six) hours as needed for moderate pain (pain score 4-6).     erythromycin ophthalmic ointment Place 1 Application into the right eye 3 (three) times daily.     estradiol  (VIVELLE -DOT) 0.1 MG/24HR patch APPLY 1 PATCH TOPICALLY TO THE SKIN 2 TIMES A WEEK     hydrochlorothiazide  (HYDRODIURIL ) 25 MG tablet TAKE 1 TABLET(25 MG) BY MOUTH DAILY 90 tablet 1   ibuprofen (ADVIL) 200 MG tablet Take 400 mg by mouth every 6 (six) hours as needed for mild pain (pain score 1-3).     OMEPRAZOLE  PO Take by mouth.     Polyethyl Glycol-Propyl Glycol (SYSTANE) 0.4-0.3 % GEL ophthalmic gel Place 1 Application into the right eye daily.     SUMAtriptan  (IMITREX ) 50 MG tablet TAKE 1 TABLET BY MOUTH EVERY 2 HOURS AS NEEDED FOR MIGRAINE. MAY REPEAT IN 2 HOURS IF HEADACHE PERSISTS OR RECURS. MAX 2 TABLETS IN 24 HOURS (Patient taking differently: Take 50 mg  by mouth every 2 (two) hours as needed for migraine.) 10 tablet 2   traMADol  (ULTRAM ) 50 MG tablet Take 1 tablet (50 mg total) by mouth every 8 (eight) hours as needed for up to 5 days. 15 tablet 0   valACYclovir  (VALTREX ) 1000 MG tablet Take 1 tablet (1,000 mg total) by mouth 2 (two) times daily. 15 tablet 2   aspirin  EC (ASPIRIN  LOW DOSE) 81 MG tablet TAKE 1 TABLET(81 MG) BY MOUTH DAILY** CALL DOCTORS OFFICE TO MAKE APPOINTMENT (Patient not taking: Reported on 03/13/2024) 15 tablet 0   acyclovir  (ZOVIRAX ) 400 MG tablet Take 1 tablet (400 mg total) by mouth 5 (five) times daily. 35 tablet  0   predniSONE  (DELTASONE ) 10 MG tablet Take 6 tablets (60 mg total) by mouth daily. 42 tablet 0   No facility-administered medications prior to visit.    PAST MEDICAL HISTORY: Past Medical History:  Diagnosis Date   Anemia    during pregnancy, not now.   Arthritis    osteoarthritis-necksome issues with cervical vertebrae   Asthma    Atypical syncope    1 episode -never had follow up with cardiology-never any further episodes, did go to urgent care.   Back pain    Colon cancer (HCC)    rectal cancer Radiation and oral chemo with radiation.   Complication of anesthesia    Constipation    Edema, lower extremity    Family history of breast cancer    Family history of uterine cancer    Genital lesion, female    at times   Heart burn    occ.   Hx of seasonal allergies    Joint pain    Motion sickness    PONV (postoperative nausea and vomiting)    Rectal cancer (HCC)    Shortness of breath    Swallowing difficulty    Umbilicus discharge     PAST SURGICAL HISTORY: Past Surgical History:  Procedure Laterality Date   ABDOMINAL HYSTERECTOMY     atypical cell, hx abnormal pap test   CESAREAN SECTION     COLON SURGERY     DIAGNOSTIC LAPAROSCOPY     with D and C and Ovarian cystectomy, tubal ligation done at this time '00   DILATION AND CURETTAGE OF UTERUS     TUBAL LIGATION     XI ROBOTIC ASSISTED LOWER ANTERIOR RESECTION N/A 03/21/2016   Procedure: XI ROBOTIC ASSISTED LOWER ANTERIOR RESECTION;  Surgeon: Bernarda Ned, MD;  Location: [facility];  Service: General;  Laterality: N/A;    FAMILY HISTORY: Family History  Problem Relation Age of Onset   Supraventricular tachycardia Mother    Alzheimer's disease Mother    Hypertension Father    COPD Father    Alcoholism Father    Hypertension Sister    Hypertension Sister    Breast cancer Maternal Grandmother        dx in her 51s   Cancer Paternal Grandmother 37       uterine cancer    Hypertension Paternal Grandfather     Heart attack Paternal Grandfather    Skin cancer Cousin        maternal first cousin    SOCIAL HISTORY: Social History   Socioeconomic History   Marital status: Legally Separated    Spouse name: Not on file   Number of children: 3   Years of education: Not on file   Highest education level: Not on file  Occupational History   Occupation: Data processing manager  Tobacco Use   Smoking status: Never    Passive exposure: Never   Smokeless tobacco: Never  Vaping Use   Vaping status: Never Used  Substance and Sexual Activity   Alcohol use: No    Alcohol/week: 0.0 standard drinks of alcohol   Drug use: No   Sexual activity: Not Currently    Birth control/protection: Abstinence  Other Topics Concern   Not on file  Social History Narrative   No exercise   Social Drivers of Corporate investment banker Strain: Not on file  Food Insecurity: Not on file  Transportation Needs: Not on file  Physical Activity: Not on file  Stress: Not on file  Social Connections: Not on file  Intimate Partner Violence: Not on file     PHYSICAL EXAM  GENERAL EXAM/CONSTITUTIONAL: Vitals:  Vitals:   03/13/24 1102  BP: 124/80  Pulse: 62  Weight: 217 lb 6.4 oz (98.6 kg)  Height: 5' 2 (1.575 m)   Body mass index is 39.76 kg/m. Wt Readings from Last 3 Encounters:  03/13/24 217 lb 6.4 oz (98.6 kg)  03/12/24 216 lb 12.8 oz (98.3 kg)  03/01/24 224 lb (101.6 kg)   Patient is in no distress; well developed, nourished and groomed; neck is supple  CARDIOVASCULAR: Examination of carotid arteries is normal; no carotid bruits Regular rate and rhythm, no murmurs Examination of peripheral vascular system by observation and palpation is normal  EYES: Ophthalmoscopic exam of optic discs and posterior segments is normal; no papilledema or hemorrhages No results found.  MUSCULOSKELETAL: Gait, strength, tone, movements noted in Neurologic exam below  NEUROLOGIC: MENTAL STATUS:      No data to  display         awake, alert, oriented to person, place and time recent and remote memory intact normal attention and concentration language fluent, comprehension intact, naming intact fund of knowledge appropriate  CRANIAL NERVE:  2nd - no papilledema on fundoscopic exam 2nd, 3rd, 4th, 6th - pupils equal and reactive to light, visual fields full to confrontation, extraocular muscles intact, no nystagmus 5th - facial sensation symmetric 7th - facial strength symmetric 8th - hearing intact 9th - palate elevates symmetrically, uvula midline 11th - shoulder shrug symmetric 12th - tongue protrusion midline  MOTOR:  normal bulk and tone, full strength in the BUE, BLE  SENSORY:  normal and symmetric to light touch, temperature, vibration  COORDINATION:  finger-nose-finger, fine finger movements normal  REFLEXES:  deep tendon reflexes TRACE and symmetric  GAIT/STATION:  narrow based gait     DIAGNOSTIC DATA (LABS, IMAGING, TESTING) - I reviewed patient records, labs, notes, testing and imaging myself where available.  Lab Results  Component Value Date   WBC 6.5 02/27/2024   HGB 13.5 02/27/2024   HCT 41.2 02/27/2024   MCV 90.2 02/27/2024   PLT 281 02/27/2024      Component Value Date/Time   NA 140 02/27/2024 1504   NA 141 05/18/2020 1339   NA 143 04/19/2017 0935   K 3.9 02/27/2024 1504   K 4.3 04/19/2017 0935   CL 103 02/27/2024 1504   CO2 27 02/27/2024 1504   CO2 29 04/19/2017 0935   GLUCOSE 91 02/27/2024 1504   GLUCOSE 83 04/19/2017 0935   BUN 11 02/27/2024 1504   BUN 17 05/18/2020 1339   BUN 9.7 04/19/2017 0935   CREATININE 0.75 02/27/2024 1504   CREATININE 0.77 03/24/2023 1428   CREATININE 0.8 04/19/2017 0935   CALCIUM 9.7 02/27/2024 1504  CALCIUM 9.9 04/19/2017 0935   PROT 8.0 02/27/2024 1504   PROT 7.4 05/18/2020 1339   PROT 7.6 04/19/2017 0935   ALBUMIN 4.4 02/27/2024 1504   ALBUMIN 5.0 (H) 05/18/2020 1339   ALBUMIN 3.9 04/19/2017 0935   AST  21 02/27/2024 1504   AST 18 02/05/2021 1150   AST 19 04/19/2017 0935   ALT 15 02/27/2024 1504   ALT 9 02/05/2021 1150   ALT 11 04/19/2017 0935   ALKPHOS 80 02/27/2024 1504   ALKPHOS 105 04/19/2017 0935   BILITOT 0.9 02/27/2024 1504   BILITOT 1.1 02/05/2021 1150   BILITOT 0.80 04/19/2017 0935   GFRNONAA >60 02/27/2024 1504   GFRNONAA >60 02/05/2021 1150   GFRAA 97 05/18/2020 1339   GFRAA >60 09/27/2018 1612   Lab Results  Component Value Date   CHOL 155 03/01/2022   HDL 54.30 03/01/2022   LDLCALC 88 03/01/2022   TRIG 64.0 03/01/2022   CHOLHDL 3 03/01/2022   Lab Results  Component Value Date   HGBA1C 5.2 03/01/2022   Lab Results  Component Value Date   VITAMINB12 379 03/24/2023   Lab Results  Component Value Date   TSH 3.07 03/24/2023    10/27/22 CT head - No evidence of acute intracranial hemorrhage, acute infarct or intracranial mass. - Partially empty sella turcica. This finding can reflect incidental anatomic variation, or alternatively,  it can be associated with idiopathic intracranial hypertension (pseudotumor cerebri).   11/30/22 MRI brain  - normal   02/27/24 MRI brain wo - normal  02/27/24 CTA head / neck w/wo 1. Normal brain. 2. Normal CT angiogram of the neck. 3. Normal variant cerebral artery anatomy.   ASSESSMENT AND PLAN  58 y.o. year old female here with:  Meds tried: topiramate (side effects)  Dx:  1. Right-sided Bell's palsy     PLAN:  RIGHT BELL'S PALSY (likely post-viral) - follow up lab testing per Dr. Antonio - continue supportive care; eye drops and eye patching - completed prednisone  and valacyclovir  - if not improving in next 4 weeks, then consider repeat MRI brain / IAC w/wo  Return for pending if symptoms worsen or fail to improve, pending test results.    EDUARD FABIENE HANLON, MD 03/13/2024, 11:25 AM Certified in Neurology, Neurophysiology and Neuroimaging  [facility] [address], [address] [place], [place] [address] [phone]

## 2024-03-14 LAB — LYME DISEASE SEROLOGY W/REFLEX: Lyme Total Antibody EIA: NEGATIVE

## 2024-03-19 LAB — EHRLICHIA ANTIBODY PANEL
E. CHAFFEENSIS AB IGG: <1:64 {titer}
E. CHAFFEENSIS AB IGM: <1:20 {titer}

## 2024-03-19 LAB — ROCKY MTN SPOTTED FVR ABS PNL(IGG+IGM)
RMSF IgG: NOT DETECTED
RMSF IgM: NOT DETECTED

## 2024-03-22 ENCOUNTER — Ambulatory Visit: Payer: Self-pay | Admitting: Family Medicine

## 2024-04-03 ENCOUNTER — Other Ambulatory Visit: Payer: Self-pay | Admitting: Family Medicine

## 2024-04-03 DIAGNOSIS — R600 Localized edema: Secondary | ICD-10-CM

## 2024-04-03 MED ORDER — HYDROCHLOROTHIAZIDE 25 MG PO TABS: 25.0000 mg | ORAL_TABLET | Freq: Every day | ORAL | 0 refills | Status: DC

## 2024-04-03 NOTE — Telephone Encounter (Signed)
 Copied from CRM #[id number]. Topic: Clinical - Medication Refill >> Apr 03, 2024  3:21 PM Suzen RAMAN wrote: Medication: hydrochlorothiazide  (HYDRODIURIL ) 25 MG tablet  Has the patient contacted their pharmacy? Yes   This is the patient's preferred pharmacy:  [id number] - [place], [place] - [address] AT [place] [place] [address] Phone: [phone] Fax: [phone]  Is this the correct pharmacy for this prescription? Yes If no, delete pharmacy and type the correct one.   Has the prescription been filled recently? No  Is the patient out of the medication? No  Has the patient been seen for an appointment in the last year OR does the patient have an upcoming appointment? Yes  Can we respond through MyChart? Yes  Agent: Please be advised that Rx refills may take up to 3 business days. We ask that you follow-up with your pharmacy.

## 2024-04-12 ENCOUNTER — Telehealth: Payer: Self-pay | Admitting: Cardiology

## 2024-04-12 NOTE — Telephone Encounter (Signed)
 Pt c/o medication issue:  1. Name of Medication: aspirin  EC (ASPIRIN  LOW DOSE) 81 MG tablet   2. How are you currently taking this medication (dosage and times per day)? Not taking  3. Are you having a reaction (difficulty breathing--STAT)? no  4. What is your medication issue? Patient wants to know if it is OK to start taking her aspirin  again with tylenol  and ibuprofen? She is scheduled for an overdue f/u on 11/03 with Dr. Bernie in [facility]

## 2024-04-12 NOTE — Telephone Encounter (Signed)
 Spoke with pt. Advised per Dr. Krasowski that it was ok to continue her Aspirin  81mg  daily with Advil and Ibuprofen

## 2024-04-18 ENCOUNTER — Ambulatory Visit (INDEPENDENT_AMBULATORY_CARE_PROVIDER_SITE_OTHER): Admitting: Family Medicine

## 2024-04-18 ENCOUNTER — Encounter: Payer: Self-pay | Admitting: Family Medicine

## 2024-04-18 VITALS — BP 118/82 | HR 74 | Temp 97.8°F | Resp 18 | Ht 62.0 in | Wt 215.4 lb

## 2024-04-18 DIAGNOSIS — R600 Localized edema: Secondary | ICD-10-CM

## 2024-04-18 DIAGNOSIS — M79671 Pain in right foot: Secondary | ICD-10-CM | POA: Diagnosis not present

## 2024-04-18 DIAGNOSIS — M791 Myalgia, unspecified site: Secondary | ICD-10-CM | POA: Diagnosis not present

## 2024-04-18 DIAGNOSIS — M79672 Pain in left foot: Secondary | ICD-10-CM

## 2024-04-18 NOTE — Progress Notes (Signed)
 Subjective:    Patient ID: Kaitlyn Morgan, female    DOB: 10-19-1965, 58 y.o.   MRN: [id number]  Chief Complaint  Patient presents with   Referral    Pt states having bilateral foot pain and thinks she may need a referral.    HPI Patient is in today for foot pain.  Discussed the use of AI scribe software for clinical note transcription with the patient, who gave verbal consent to proceed.  History of Present Illness Kaitlyn Morgan is a 58 year old female who presents with bilateral heel pain and facial muscle weakness.  She experiences bilateral heel pain that began in June, initially affecting one foot after walking with her puppy. The pain is located at the back of the heel and is described as burning and shooting, particularly when bending down or standing up after sitting for a long time. It worsens in the morning and after prolonged sitting. She has tried various physical therapy exercises and different shoes, including Brooks with insoles, but found the insoles uncomfortable. She also tried Crocs for elevation but experienced pain under the foot. A seven-day course of steroids in July seemed to reduce the pain temporarily. She has been using ibuprofen and ice for relief. She also reports tightness in her legs, which she wonders might be contributing to her foot pain.  She reports facial muscle weakness, noting improvement in her ability to smile and move her nose, but her eye still does not blink, requiring it to be taped. She can leave the tape off between eye drops but experiences issues with depth perception while driving.  She has a history of headaches and has recently fasted to see if it would help with inflammation. She takes a fluid pill daily and has experienced headaches recently, which have not been relieved by Tylenol  or Advil. No swelling in the legs but notes tightness. Occasional soreness in the toes but not currently. She takes vitamin D3 and B12 occasionally but does  not have a known deficiency.    Past Medical History:  Diagnosis Date   Anemia    during pregnancy, not now.   Arthritis    osteoarthritis-necksome issues with cervical vertebrae   Asthma    Atypical syncope    1 episode -never had follow up with cardiology-never any further episodes, did go to urgent care.   Back pain    Colon cancer (HCC)    rectal cancer Radiation and oral chemo with radiation.   Complication of anesthesia    Constipation    Edema, lower extremity    Family history of breast cancer    Family history of uterine cancer    Genital lesion, female    at times   Heart burn    occ.   Hx of seasonal allergies    Joint pain    Motion sickness    PONV (postoperative nausea and vomiting)    Rectal cancer (HCC)    Shortness of breath    Swallowing difficulty    Umbilicus discharge     Past Surgical History:  Procedure Laterality Date   ABDOMINAL HYSTERECTOMY     atypical cell, hx abnormal pap test   CESAREAN SECTION     COLON SURGERY     DIAGNOSTIC LAPAROSCOPY     with D and C and Ovarian cystectomy, tubal ligation done at this time '00   DILATION AND CURETTAGE OF UTERUS     TUBAL LIGATION     XI ROBOTIC ASSISTED LOWER  ANTERIOR RESECTION N/A 03/21/2016   Procedure: XI ROBOTIC ASSISTED LOWER ANTERIOR RESECTION;  Surgeon: Bernarda Ned, MD;  Location: [facility];  Service: General;  Laterality: N/A;    Family History  Problem Relation Age of Onset   Supraventricular tachycardia Mother    Alzheimer's disease Mother    Hypertension Father    COPD Father    Alcoholism Father    Hypertension Sister    Hypertension Sister    Breast cancer Maternal Grandmother        dx in her 43s   Cancer Paternal Grandmother 74       uterine cancer    Hypertension Paternal Grandfather    Heart attack Paternal Grandfather    Skin cancer Cousin        maternal first cousin    Social History   Socioeconomic History   Marital status: Legally Separated    Spouse name:  Not on file   Number of children: 3   Years of education: Not on file   Highest education level: Not on file  Occupational History   Occupation: Data processing manager  Tobacco Use   Smoking status: Never    Passive exposure: Never   Smokeless tobacco: Never  Vaping Use   Vaping status: Never Used  Substance and Sexual Activity   Alcohol use: No    Alcohol/week: 0.0 standard drinks of alcohol   Drug use: No   Sexual activity: Not Currently    Birth control/protection: Abstinence  Other Topics Concern   Not on file  Social History Narrative   No exercise   Social Drivers of Corporate investment banker Strain: Not on file  Food Insecurity: Not on file  Transportation Needs: Not on file  Physical Activity: Not on file  Stress: Not on file  Social Connections: Not on file  Intimate Partner Violence: Not on file    Outpatient Medications Prior to Visit  Medication Sig Dispense Refill   acetaminophen  (TYLENOL ) 500 MG tablet Take 1,000 mg by mouth every 6 (six) hours as needed for moderate pain (pain score 4-6).     estradiol  (VIVELLE -DOT) 0.1 MG/24HR patch APPLY 1 PATCH TOPICALLY TO THE SKIN 2 TIMES A WEEK     hydrochlorothiazide  (HYDRODIURIL ) 25 MG tablet Take 1 tablet (25 mg total) by mouth daily. 90 tablet 0   ibuprofen (ADVIL) 200 MG tablet Take 400 mg by mouth every 6 (six) hours as needed for mild pain (pain score 1-3).     OMEPRAZOLE  PO Take by mouth.     Polyethyl Glycol-Propyl Glycol (SYSTANE) 0.4-0.3 % GEL ophthalmic gel Place 1 Application into the right eye daily.     SUMAtriptan  (IMITREX ) 50 MG tablet TAKE 1 TABLET BY MOUTH EVERY 2 HOURS AS NEEDED FOR MIGRAINE. MAY REPEAT IN 2 HOURS IF HEADACHE PERSISTS OR RECURS. MAX 2 TABLETS IN 24 HOURS (Patient taking differently: Take 50 mg by mouth every 2 (two) hours as needed for migraine.) 10 tablet 2   valACYclovir  (VALTREX ) 1000 MG tablet Take 1 tablet (1,000 mg total) by mouth 2 (two) times daily. 15 tablet 2    erythromycin ophthalmic ointment Place 1 Application into the right eye 3 (three) times daily.     aspirin  EC (ASPIRIN  LOW DOSE) 81 MG tablet TAKE 1 TABLET(81 MG) BY MOUTH DAILY** CALL DOCTORS OFFICE TO MAKE APPOINTMENT (Patient not taking: Reported on 04/18/2024) 15 tablet 0   No facility-administered medications prior to visit.    Allergies  Allergen Reactions   Codeine  Nausea Only   Morphine And Codeine Itching and Nausea Only   Sucralose Nausea And Vomiting    Fever   Zolpidem Tartrate     Hallucinations    Zolpidem Tartrate     Other reaction(s): Other (See Comments) Hallucinations Hallucinations    Review of Systems  Constitutional:  Negative for fever and malaise/fatigue.  HENT:  Negative for congestion.   Eyes:  Negative for blurred vision.  Respiratory:  Negative for cough and shortness of breath.   Cardiovascular:  Negative for chest pain, palpitations and leg swelling.  Gastrointestinal:  Negative for vomiting.  Musculoskeletal:  Positive for joint pain. Negative for back pain.  Skin:  Negative for rash.  Neurological:  Negative for loss of consciousness and headaches.       Objective:    Physical Exam Vitals and nursing note reviewed.  Musculoskeletal:        General: Tenderness present. No swelling or deformity.     Right lower leg: No edema.     Left lower leg: No edema.     Right foot: Tenderness present. No bony tenderness or crepitus.     Left foot: Tenderness present. No bony tenderness or crepitus.     BP 118/82 (BP Location: Left Arm, Patient Position: Sitting, Cuff Size: Large)   Pulse 74   Temp 97.8 F (36.6 C) (Oral)   Resp 18   Ht 5' 2 (1.575 m)   Wt 215 lb 6.4 oz (97.7 kg)   SpO2 98%   BMI 39.40 kg/m  Wt Readings from Last 3 Encounters:  04/18/24 215 lb 6.4 oz (97.7 kg)  03/13/24 217 lb 6.4 oz (98.6 kg)  03/12/24 216 lb 12.8 oz (98.3 kg)    Diabetic Foot Exam - Simple   No data filed    Lab Results  Component Value Date    WBC 6.5 02/27/2024   HGB 13.5 02/27/2024   HCT 41.2 02/27/2024   PLT 281 02/27/2024   GLUCOSE 91 02/27/2024   CHOL 155 03/01/2022   TRIG 64.0 03/01/2022   HDL 54.30 03/01/2022   LDLCALC 88 03/01/2022   ALT 15 02/27/2024   AST 21 02/27/2024   NA 140 02/27/2024   K 3.9 02/27/2024   CL 103 02/27/2024   CREATININE 0.75 02/27/2024   BUN 11 02/27/2024   CO2 27 02/27/2024   TSH 3.07 03/24/2023   INR 1.0 02/27/2024   HGBA1C 5.2 03/01/2022    Lab Results  Component Value Date   TSH 3.07 03/24/2023   Lab Results  Component Value Date   WBC 6.5 02/27/2024   HGB 13.5 02/27/2024   HCT 41.2 02/27/2024   MCV 90.2 02/27/2024   PLT 281 02/27/2024   Lab Results  Component Value Date   NA 140 02/27/2024   K 3.9 02/27/2024   CHLORIDE 106 04/19/2017   CO2 27 02/27/2024   GLUCOSE 91 02/27/2024   BUN 11 02/27/2024   CREATININE 0.75 02/27/2024   BILITOT 0.9 02/27/2024   ALKPHOS 80 02/27/2024   AST 21 02/27/2024   ALT 15 02/27/2024   PROT 8.0 02/27/2024   ALBUMIN 4.4 02/27/2024   CALCIUM 9.7 02/27/2024   ANIONGAP 10 02/27/2024   EGFR 82 (L) 04/19/2017   GFR 83.88 06/08/2023   Lab Results  Component Value Date   CHOL 155 03/01/2022   Lab Results  Component Value Date   HDL 54.30 03/01/2022   Lab Results  Component Value Date   LDLCALC 88 03/01/2022   Lab  Results  Component Value Date   TRIG 64.0 03/01/2022   Lab Results  Component Value Date   CHOLHDL 3 03/01/2022   Lab Results  Component Value Date   HGBA1C 5.2 03/01/2022       Assessment & Plan:  Foot pain, bilateral -     Ambulatory referral to Podiatry -     Lipid panel; Future  Lower extremity edema -     Lipid panel; Future  Myalgia -     CBC with Differential/Platelet; Future -     Comprehensive metabolic panel with GFR; Future -     Lipid panel; Future -     Sedimentation rate; Future -     TSH; Future -     Vitamin B12; Future -     VITAMIN D  25 Hydroxy (Vit-D Deficiency, Fractures);  Future  Assessment and Plan Assessment & Plan Bilateral heel pain   Chronic bilateral heel pain since June presents as burning and shooting pain at the back of the heel, worsened by certain footwear and activities. Symptoms suggest a combination of plantar fasciitis and possible heel spurs. Refer to podiatry for further evaluation and possible x-rays. Recommend consulting with Garrel at [facility] for appropriate shoe inserts. Advise against wearing Crocs and recommend proper footwear with good support. Use ibuprofen for pain management. Instruct to ice the feet using a frozen water bottle. Encourage stretching exercises for the legs and feet.  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 Improvement in facial movement noted, but eye symptoms persist.  Headache   Recurrent headaches with recent exacerbation. Previous eye examination showed no damage. She has been managing with Tylenol  and Advil, with limited relief. Discussed potential causes of inflammation and the role of fasting and diet in managing symptoms. Check blood work including vitamin D  and B12 levels.    Artyom Stencel R Lowne Chase, DO

## 2024-04-19 ENCOUNTER — Other Ambulatory Visit

## 2024-04-22 ENCOUNTER — Encounter: Payer: Self-pay | Admitting: Podiatry

## 2024-04-22 ENCOUNTER — Ambulatory Visit (INDEPENDENT_AMBULATORY_CARE_PROVIDER_SITE_OTHER): Admitting: Podiatry

## 2024-04-22 ENCOUNTER — Ambulatory Visit (INDEPENDENT_AMBULATORY_CARE_PROVIDER_SITE_OTHER)

## 2024-04-22 VITALS — Ht 62.0 in | Wt 215.0 lb

## 2024-04-22 DIAGNOSIS — M722 Plantar fascial fibromatosis: Secondary | ICD-10-CM

## 2024-04-22 DIAGNOSIS — M7661 Achilles tendinitis, right leg: Secondary | ICD-10-CM

## 2024-04-22 DIAGNOSIS — M24572 Contracture, left ankle: Secondary | ICD-10-CM

## 2024-04-22 DIAGNOSIS — M7751 Other enthesopathy of right foot: Secondary | ICD-10-CM

## 2024-04-22 DIAGNOSIS — M24571 Contracture, right ankle: Secondary | ICD-10-CM | POA: Diagnosis not present

## 2024-04-22 DIAGNOSIS — M7752 Other enthesopathy of left foot: Secondary | ICD-10-CM

## 2024-04-22 DIAGNOSIS — M7662 Achilles tendinitis, left leg: Secondary | ICD-10-CM

## 2024-04-22 MED ORDER — IBUPROFEN 600 MG PO TABS: 600.0000 mg | ORAL_TABLET | Freq: Four times a day (QID) | ORAL | 0 refills | Status: AC | PRN

## 2024-04-22 NOTE — Progress Notes (Signed)
 Subjective:  Patient ID: Kaitlyn Morgan, female    DOB: 18-Sep-1965,  MRN: [id number]  Chief Complaint  Patient presents with   Foot Pain    Pt is here due to bilateral heel pain, that started in June, states no injury to feet, cancer survivor states when dx with cancer she begin to difficulty walking.    Discussed the use of AI scribe software for clinical note transcription with the patient, who gave verbal consent to proceed.  History of Present Illness Kaitlyn Morgan is a 58 year old female who presents with bilateral heel pain.  She has been experiencing bilateral heel pain since December, described as sore and sometimes excruciating with shooting pain, particularly worse at night. The pain began after she started walking her dog and participating in physical therapy for her pelvic floor. It is distinct from the foot discomfort she experienced following her cancer treatment, which included chemotherapy, radiation, and immunotherapy.  The pain is located in the back and side of her feet, significantly impacting her ability to walk. She has attempted various interventions, such as wearing shoes with an incline and performing stretches, but continues to experience fatigue in her feet. She has also used Band-Aids for cuts caused by her shoes.  She alternates between Advil  and Tylenol  for pain management and was previously on prednisone  for Bell's palsy, which she developed in July. She is reluctant to use prednisone . Currently, she takes Advil  as needed but is cautious due to concerns about other joint issues.  She has tried insoles for plantar fasciitis but finds them uncomfortable due to pressure on her heel. She has purchased [employer] for additional arch support, yet continues to experience pain.      Objective:    Physical Exam VASCULAR: DP and PT pulse palpable. Foot is warm and well-perfused. Capillary fill time is brisk. DERMATOLOGIC: Normal skin turgor, texture, and  temperature. No open lesions, rashes, or ulcerations. NEUROLOGIC: Normal sensation to light touch and pressure. No paresthesias. ORTHOPEDIC: Smooth pain-free range of motion of all examined joints. No ecchymosis or bruising. No gross deformity. Mild tenderness on palpation in the posterior heel. Sharp severe pain in right plantar medial heel at medial band of plantar fascia insertion. No pain on left heel at plantar fascia insertion. Significant gastrocnemius equinus.   No images are attached to the encounter.    Results RADIOLOGY Foot X-ray: No fracture or stress fracture. Small plantar calcaneal spur and posterior calcaneal spur on the right.   Assessment:   1. Plantar fasciitis   2. Equinus contracture of right ankle   3. Equinus contracture of left ankle   4. Achilles tendinitis of both lower extremities      Plan:  Patient was evaluated and treated and all questions answered.  Assessment and Plan Assessment & Plan Right plantar fasciitis Acute severe sharp pain in the right plantar medial heel at the insertion of the medial band of the plantar fascia, exacerbated by weight-bearing activities. Radiographs show no fracture or stress fracture, but a small plantar calcaneal spur is present. The condition is likely exacerbated by tight Achilles tendon and gastrocnemius equinus, leading to increased tension on the plantar fascia. Corticosteroid injection has an good success rate in reducing inflammation and pain. Discussed potential soreness post-injection and low risk of infection or steroid flare, but significant improvement is expected. - Administer corticosteroid injection to the right heel to reduce inflammation. - Prescribe ibuprofen  600 mg tablets to be taken up to three times a day as  needed for pain and inflammation. - Dispense PowerStep orthotics for arch support. - Recommend physical therapy with a focus on stretching exercises, particularly for the Achilles tendon. -  Dispense a night splint to stretch the Achilles tendon overnight.  Right Achilles tendinitis with gastrocnemius equinus Chronic tightness and mild tenderness in the posterior heel, consistent with Achilles tendinitis. Significant gastrocnemius equinus noted, contributing to plantar fasciitis by increasing tension on the plantar fascia during ambulation. Stretching exercises and night splint use are crucial for alleviating symptoms. - Dispense a night splint to stretch the Achilles tendon overnight. - Recommend physical therapy with a focus on stretching exercises, particularly for the Achilles tendon. - Prescribe ibuprofen  600 mg tablets to be taken up to three times a day as needed for pain and inflammation.  Right plantar calcaneal spur Presence of a small plantar calcaneal spur on radiographs, contributing to plantar fasciitis symptoms. No acute fracture or stress fracture noted. Corticosteroid injection is expected to reduce inflammation and pain effectively. - Administer corticosteroid injection to the right heel to reduce inflammation. - Prescribe ibuprofen  600 mg tablets to be taken up to three times a day as needed for pain and inflammation. - Dispense PowerStep orthotics for arch support.    After sterile prep with povidone-iodine solution and alcohol, the right heel was injected with 0.5cc 2% xylocaine  plain, 0.5cc 0.5% marcaine  plain, 20 mg triamcinolone  acetonide, and 4 mg dexamethasone  was injected along the medial plantar fascia at the insertion on the plantar calcaneus. The patient tolerated the procedure well without complication.   Return in about 6 weeks (around 06/03/2024) for recheck plantar fasciitis, re-check Achilles tendon.

## 2024-04-22 NOTE — Patient Instructions (Signed)
 VISIT SUMMARY: Today, you were seen for bilateral heel pain that has been affecting you since December. The pain has been particularly severe at night and has impacted your ability to walk. You have tried various interventions, including different shoes, insoles, and pain medications, but continue to experience significant discomfort.  YOUR PLAN: -RIGHT PLANTAR FASCIITIS: Plantar fasciitis is inflammation of the tissue on the bottom of your foot that connects your heel bone to your toes. To reduce inflammation and pain, a corticosteroid injection will be administered to your right heel. You are also prescribed ibuprofen  600 mg tablets to be taken up to three times a day as needed. PowerStep orthotics will be provided for arch support, and physical therapy focusing on stretching exercises, especially for the Achilles tendon, is recommended. Additionally, a night splint will be dispensed to stretch your Achilles tendon overnight.  -RIGHT ACHILLES TENDINITIS WITH GASTROCNEMIUS EQUINUS: Achilles tendinitis is inflammation of the Achilles tendon, often due to tight calf muscles. This condition is contributing to your plantar fasciitis. You will be given a night splint to stretch your Achilles tendon overnight and recommended physical therapy focusing on stretching exercises for the Achilles tendon. You are also prescribed ibuprofen  600 mg tablets to be taken up to three times a day as needed for pain and inflammation.  -RIGHT PLANTAR CALCANEAL SPUR: A plantar calcaneal spur is a bony growth on the bottom of your heel bone, which can contribute to plantar fasciitis symptoms. A corticosteroid injection will be administered to your right heel to reduce inflammation and pain. You are also prescribed ibuprofen  600 mg tablets to be taken up to three times a day as needed. PowerStep orthotics will be provided for arch support.  INSTRUCTIONS: Please follow up with physical therapy as recommended and use the night  splint as directed. If your symptoms do not improve or worsen, please schedule a follow-up appointment.                      Contains text generated by [employer].          Plantar Fasciitis (Heel Spur Syndrome) with Rehab The plantar fascia is a fibrous, ligament-like, soft-tissue structure that spans the bottom of the foot. Plantar fasciitis is a condition that causes pain in the foot due to inflammation of the tissue. SYMPTOMS  Pain and tenderness on the underneath side of the foot. Pain that worsens with standing or walking. CAUSES  Plantar fasciitis is caused by irritation and injury to the plantar fascia on the underneath side of the foot. Common mechanisms of injury include: Direct trauma to bottom of the foot. Damage to a small nerve that runs under the foot where the main fascia attaches to the heel bone. Stress placed on the plantar fascia due to bone spurs. RISK INCREASES WITH:  Activities that place stress on the plantar fascia (running, jumping, pivoting, or cutting). Poor strength and flexibility. Improperly fitted shoes. Tight calf muscles. Flat feet. Failure to warm-up properly before activity. Obesity. PREVENTION Warm up and stretch properly before activity. Allow for adequate recovery between workouts. Maintain physical fitness: Strength, flexibility, and endurance. Cardiovascular fitness. Maintain a health body weight. Avoid stress on the plantar fascia. Wear properly fitted shoes, including arch supports for individuals who have flat feet.  PROGNOSIS  If treated properly, then the symptoms of plantar fasciitis usually resolve without surgery. However, occasionally surgery is necessary.  RELATED COMPLICATIONS  Recurrent symptoms that may result in a chronic condition. Problems of the lower  back that are caused by compensating for the injury, such as limping. Pain or weakness of the foot during push-off following surgery. Chronic  inflammation, scarring, and partial or complete fascia tear, occurring more often from repeated injections.  TREATMENT  Treatment initially involves the use of ice and medication to help reduce pain and inflammation. The use of strengthening and stretching exercises may help reduce pain with activity, especially stretches of the Achilles tendon. These exercises may be performed at home or with a therapist. Your caregiver may recommend that you use heel cups of arch supports to help reduce stress on the plantar fascia. Occasionally, corticosteroid injections are given to reduce inflammation. If symptoms persist for greater than 6 months despite non-surgical (conservative), then surgery may be recommended.   MEDICATION  If pain medication is necessary, then nonsteroidal anti-inflammatory medications, such as aspirin  and ibuprofen , or other minor pain relievers, such as acetaminophen , are often recommended. Do not take pain medication within 7 days before surgery. Prescription pain relievers may be given if deemed necessary by your caregiver. Use only as directed and only as much as you need. Corticosteroid injections may be given by your caregiver. These injections should be reserved for the most serious cases, because they may only be given a certain number of times.  HEAT AND COLD Cold treatment (icing) relieves pain and reduces inflammation. Cold treatment should be applied for 10 to 15 minutes every 2 to 3 hours for inflammation and pain and immediately after any activity that aggravates your symptoms. Use ice packs or massage the area with a piece of ice (ice massage). Heat treatment may be used prior to performing the stretching and strengthening activities prescribed by your caregiver, physical therapist, or athletic trainer. Use a heat pack or soak the injury in warm water.  SEEK IMMEDIATE MEDICAL CARE IF: Treatment seems to offer no benefit, or the condition worsens. Any medications produce  adverse side effects.  EXERCISES- RANGE OF MOTION (ROM) AND STRETCHING EXERCISES - Plantar Fasciitis (Heel Spur Syndrome) These exercises may help you when beginning to rehabilitate your injury. Your symptoms may resolve with or without further involvement from your physician, physical therapist or athletic trainer. While completing these exercises, remember:  Restoring tissue flexibility helps normal motion to return to the joints. This allows healthier, less painful movement and activity. An effective stretch should be held for at least 30 seconds. A stretch should never be painful. You should only feel a gentle lengthening or release in the stretched tissue.  RANGE OF MOTION - Toe Extension, Flexion Sit with your right / left leg crossed over your opposite knee. Grasp your toes and gently pull them back toward the top of your foot. You should feel a stretch on the bottom of your toes and/or foot. Hold this stretch for 10 seconds. Now, gently pull your toes toward the bottom of your foot. You should feel a stretch on the top of your toes and or foot. Hold this stretch for 10 seconds. Repeat  times. Complete this stretch 3 times per day.   RANGE OF MOTION - Ankle Dorsiflexion, Active Assisted Remove shoes and sit on a chair that is preferably not on a carpeted surface. Place right / left foot under knee. Extend your opposite leg for support. Keeping your heel down, slide your right / left foot back toward the chair until you feel a stretch at your ankle or calf. If you do not feel a stretch, slide your bottom forward to the edge of  the chair, while still keeping your heel down. Hold this stretch for 10 seconds. Repeat 3 times. Complete this stretch 2 times per day.   STRETCH  Gastroc, Standing Place hands on wall. Extend right / left leg, keeping the front knee somewhat bent. Slightly point your toes inward on your back foot. Keeping your right / left heel on the floor and your knee  straight, shift your weight toward the wall, not allowing your back to arch. You should feel a gentle stretch in the right / left calf. Hold this position for 10 seconds. Repeat 3 times. Complete this stretch 2 times per day.  STRETCH  Soleus, Standing Place hands on wall. Extend right / left leg, keeping the other knee somewhat bent. Slightly point your toes inward on your back foot. Keep your right / left heel on the floor, bend your back knee, and slightly shift your weight over the back leg so that you feel a gentle stretch deep in your back calf. Hold this position for 10 seconds. Repeat 3 times. Complete this stretch 2 times per day.  STRETCH  Gastrocsoleus, Standing  Note: This exercise can place a lot of stress on your foot and ankle. Please complete this exercise only if specifically instructed by your caregiver.  Place the ball of your right / left foot on a step, keeping your other foot firmly on the same step. Hold on to the wall or a rail for balance. Slowly lift your other foot, allowing your body weight to press your heel down over the edge of the step. You should feel a stretch in your right / left calf. Hold this position for 10 seconds. Repeat this exercise with a slight bend in your right / left knee. Repeat 3 times. Complete this stretch 2 times per day.   STRENGTHENING EXERCISES - Plantar Fasciitis (Heel Spur Syndrome)  These exercises may help you when beginning to rehabilitate your injury. They may resolve your symptoms with or without further involvement from your physician, physical therapist or athletic trainer. While completing these exercises, remember:  Muscles can gain both the endurance and the strength needed for everyday activities through controlled exercises. Complete these exercises as instructed by your physician, physical therapist or athletic trainer. Progress the resistance and repetitions only as guided.  STRENGTH - Towel Curls Sit in a chair  positioned on a non-carpeted surface. Place your foot on a towel, keeping your heel on the floor. Pull the towel toward your heel by only curling your toes. Keep your heel on the floor. Repeat 3 times. Complete this exercise 2 times per day.  STRENGTH - Ankle Inversion Secure one end of a rubber exercise band/tubing to a fixed object (table, pole). Loop the other end around your foot just before your toes. Place your fists between your knees. This will focus your strengthening at your ankle. Slowly, pull your big toe up and in, making sure the band/tubing is positioned to resist the entire motion. Hold this position for 10 seconds. Have your muscles resist the band/tubing as it slowly pulls your foot back to the starting position. Repeat 3 times. Complete this exercises 2 times per day.  Document Released: 08/01/2005 Document Revised: 10/24/2011 Document Reviewed: 11/13/2008 [employer] Patient Information 2014 [place], [place].

## 2024-04-26 ENCOUNTER — Other Ambulatory Visit (INDEPENDENT_AMBULATORY_CARE_PROVIDER_SITE_OTHER)

## 2024-04-26 DIAGNOSIS — M79672 Pain in left foot: Secondary | ICD-10-CM

## 2024-04-26 DIAGNOSIS — M79671 Pain in right foot: Secondary | ICD-10-CM | POA: Diagnosis not present

## 2024-04-26 DIAGNOSIS — R600 Localized edema: Secondary | ICD-10-CM

## 2024-04-26 DIAGNOSIS — M791 Myalgia, unspecified site: Secondary | ICD-10-CM

## 2024-04-26 LAB — COMPREHENSIVE METABOLIC PANEL WITH GFR
ALT: 9 U/L (ref 0–35)
AST: 12 U/L (ref 0–37)
Albumin: 4.5 g/dL (ref 3.5–5.2)
Alkaline Phosphatase: 61 U/L (ref 39–117)
BUN: 19 mg/dL (ref 6–23)
CO2: 33 meq/L — ABNORMAL HIGH (ref 19–32)
Calcium: 9.5 mg/dL (ref 8.4–10.5)
Chloride: 99 meq/L (ref 96–112)
Creatinine, Ser: 0.83 mg/dL (ref 0.40–1.20)
GFR: 78.56 mL/min (ref >60.00)
Glucose, Bld: 70 mg/dL (ref 70–99)
Potassium: 3.8 meq/L (ref 3.5–5.1)
Sodium: 141 meq/L (ref 135–145)
Total Bilirubin: 0.9 mg/dL (ref 0.2–1.2)
Total Protein: 6.8 g/dL (ref 6.0–8.3)

## 2024-04-26 LAB — CBC WITH DIFFERENTIAL/PLATELET
Basophils Absolute: 0.1 K/uL (ref 0.0–0.1)
Basophils Relative: 0.8 % (ref 0.0–3.0)
Eosinophils Absolute: 0.1 K/uL (ref 0.0–0.7)
Eosinophils Relative: 0.8 % (ref 0.0–5.0)
HCT: 39.7 % (ref 36.0–46.0)
Hemoglobin: 13.3 g/dL (ref 12.0–15.0)
Lymphocytes Relative: 29.4 % (ref 12.0–46.0)
Lymphs Abs: 2 K/uL (ref 0.7–4.0)
MCHC: 33.4 g/dL (ref 30.0–36.0)
MCV: 89.4 fl (ref 78.0–100.0)
Monocytes Absolute: 0.5 K/uL (ref 0.1–1.0)
Monocytes Relative: 6.8 % (ref 3.0–12.0)
Neutro Abs: 4.2 K/uL (ref 1.4–7.7)
Neutrophils Relative %: 62.2 % (ref 43.0–77.0)
Platelets: 286 K/uL (ref 150.0–400.0)
RBC: 4.44 Mil/uL (ref 3.87–5.11)
RDW: 15 % (ref 11.5–15.5)
WBC: 6.7 K/uL (ref 4.0–10.5)

## 2024-04-26 LAB — LIPID PANEL
Cholesterol: 158 mg/dL (ref 0–200)
HDL: 43.5 mg/dL (ref >39.00)
LDL Cholesterol: 84 mg/dL (ref 0–99)
NonHDL: 114.41
Total CHOL/HDL Ratio: 4
Triglycerides: 153 mg/dL — ABNORMAL HIGH (ref 0.0–149.0)
VLDL: 30.6 mg/dL (ref 0.0–40.0)

## 2024-04-26 LAB — VITAMIN B12: Vitamin B-12: 405 pg/mL (ref 211–911)

## 2024-04-26 LAB — TSH: TSH: 3.66 u[IU]/mL (ref 0.35–5.50)

## 2024-04-26 LAB — SEDIMENTATION RATE: Sed Rate: 4 mm/h (ref 0–30)

## 2024-04-26 LAB — VITAMIN D 25 HYDROXY (VIT D DEFICIENCY, FRACTURES): VITD: 32.85 ng/mL (ref 30.00–100.00)

## 2024-05-03 ENCOUNTER — Ambulatory Visit: Payer: Self-pay | Admitting: Family Medicine

## 2024-06-04 ENCOUNTER — Institutional Professional Consult (permissible substitution): Admitting: Diagnostic Neuroimaging

## 2024-06-04 ENCOUNTER — Ambulatory Visit (INDEPENDENT_AMBULATORY_CARE_PROVIDER_SITE_OTHER): Admitting: Podiatry

## 2024-06-04 DIAGNOSIS — M722 Plantar fascial fibromatosis: Secondary | ICD-10-CM

## 2024-06-04 DIAGNOSIS — M24571 Contracture, right ankle: Secondary | ICD-10-CM

## 2024-06-04 DIAGNOSIS — M24572 Contracture, left ankle: Secondary | ICD-10-CM

## 2024-06-06 ENCOUNTER — Other Ambulatory Visit: Payer: Self-pay | Admitting: Podiatry

## 2024-06-09 NOTE — Progress Notes (Signed)
  Subjective:  Patient ID: Kaitlyn Morgan, female    DOB: 18-Jan-1966,  MRN: [id number]  Chief Complaint  Patient presents with   Plantar Fasciitis    Injection helped last visit as did the ibuprofen . The left is still bothering her, primarily medial aspect of heel. Describes pain as sharp.     Discussed the use of AI scribe software for clinical note transcription with the patient, who gave verbal consent to proceed.  History of Present Illness Kaitlyn Morgan is a 58 year old female who presents with bilateral heel pain.  She returns for follow-up.  Has had improvement but has not fully resolved.      Objective:    Physical Exam VASCULAR: DP and PT pulse palpable. Foot is warm and well-perfused. Capillary fill time is brisk. DERMATOLOGIC: Normal skin turgor, texture, and temperature. No open lesions, rashes, or ulcerations. NEUROLOGIC: Normal sensation to light touch and pressure. No paresthesias. ORTHOPEDIC: Has had some improvement.  Still tenderness in the left heel.   No images are attached to the encounter.    Results RADIOLOGY Foot X-ray: No fracture or stress fracture. Small plantar calcaneal spur and posterior calcaneal spur on the right.   Assessment:   1. Plantar fasciitis   2. Equinus contracture of right ankle   3. Equinus contracture of left ankle       Plan:  Patient was evaluated and treated and all questions answered.  Assessment and Plan Assessment & Plan Plantar fasciitis and equinus Doing better and making improvement.  Continues with night splint ibuprofen  and home physical therapy.  Return in 2 months to reevaluate.       Return in about 2 months (around 08/04/2024) for f/u left heel pain and equinus.

## 2024-06-14 ENCOUNTER — Other Ambulatory Visit: Payer: Self-pay | Admitting: Family Medicine

## 2024-06-14 DIAGNOSIS — R600 Localized edema: Secondary | ICD-10-CM

## 2024-06-17 ENCOUNTER — Encounter: Payer: Self-pay | Admitting: *Deleted

## 2024-06-17 ENCOUNTER — Ambulatory Visit: Attending: Cardiology | Admitting: Cardiology

## 2024-06-17 ENCOUNTER — Encounter: Payer: Self-pay | Admitting: Cardiology

## 2024-06-17 VITALS — BP 110/90 | HR 75 | Ht 62.0 in | Wt 220.0 lb

## 2024-06-17 DIAGNOSIS — I7300 Raynaud's syndrome without gangrene: Secondary | ICD-10-CM

## 2024-06-17 DIAGNOSIS — C20 Malignant neoplasm of rectum: Secondary | ICD-10-CM

## 2024-06-17 DIAGNOSIS — R632 Polyphagia: Secondary | ICD-10-CM | POA: Insufficient documentation

## 2024-06-17 DIAGNOSIS — F489 Nonpsychotic mental disorder, unspecified: Secondary | ICD-10-CM | POA: Insufficient documentation

## 2024-06-17 DIAGNOSIS — Z136 Encounter for screening for cardiovascular disorders: Secondary | ICD-10-CM

## 2024-06-17 DIAGNOSIS — G4733 Obstructive sleep apnea (adult) (pediatric): Secondary | ICD-10-CM | POA: Diagnosis not present

## 2024-06-17 DIAGNOSIS — N8189 Other female genital prolapse: Secondary | ICD-10-CM | POA: Insufficient documentation

## 2024-06-17 DIAGNOSIS — D8989 Other specified disorders involving the immune mechanism, not elsewhere classified: Secondary | ICD-10-CM | POA: Insufficient documentation

## 2024-06-17 DIAGNOSIS — R7689 Other specified abnormal immunological findings in serum: Secondary | ICD-10-CM | POA: Insufficient documentation

## 2024-06-17 DIAGNOSIS — R519 Headache, unspecified: Secondary | ICD-10-CM | POA: Insufficient documentation

## 2024-06-17 DIAGNOSIS — H539 Unspecified visual disturbance: Secondary | ICD-10-CM | POA: Insufficient documentation

## 2024-06-17 DIAGNOSIS — E66812 Obesity, class 2: Secondary | ICD-10-CM | POA: Insufficient documentation

## 2024-06-17 NOTE — Patient Instructions (Signed)

## 2024-06-17 NOTE — Progress Notes (Unsigned)
 Cardiology Office Note:    Date:  06/17/2024   ID:  Kaitlyn Morgan, DOB February 07, 1966, MRN [id number]  PCP:  [name], [name], DO  Cardiologist:  Lamar Fitch, MD    Referring MD: [name], [name], *   Chief Complaint  Patient presents with   Follow-up    History of Present Illness:    Kaitlyn Morgan is a 58 y.o. female past medical history significant for atypical chest pain, dyslipidemia, she presented to our office 2 years ago with numerous complaints.  Some of this complaint included chest pain.  We end up having coronary CT angio which showed calcium score 0, no coronary artery disease.  She showed up in regular follow-up 2 years after last visit.  Doing well cardiac wise a lot of issue happening headaches some orthopedic limitations with plantar fasciitis struggling with weight trying to lose some but with not much success.  Described 1 episode of chest pain that lasting for all day but gone since that time.  She tried some stomach medication which seems to be helping.  Does not smoke cholesterol looks good  Past Medical History:  Diagnosis Date   Anemia    during pregnancy, not now.   Arthritis    osteoarthritis-necksome issues with cervical vertebrae   Asthma    Atypical syncope    1 episode -never had follow up with cardiology-never any further episodes, did go to urgent care.   Back pain    Colon cancer (HCC)    rectal cancer Radiation and oral chemo with radiation.   Complication of anesthesia    Constipation    Edema, lower extremity    Family history of breast cancer    Family history of uterine cancer    Genital lesion, female    at times   Heart burn    occ.   Hx of seasonal allergies    Joint pain    Motion sickness    PONV (postoperative nausea and vomiting)    Rectal cancer (HCC)    Shortness of breath    Swallowing difficulty    Umbilicus discharge     Past Surgical History:  Procedure Laterality Date   ABDOMINAL HYSTERECTOMY      atypical cell, hx abnormal pap test   CESAREAN SECTION     COLON SURGERY     DIAGNOSTIC LAPAROSCOPY     with D and C and Ovarian cystectomy, tubal ligation done at this time '00   DILATION AND CURETTAGE OF UTERUS     TUBAL LIGATION     XI ROBOTIC ASSISTED LOWER ANTERIOR RESECTION N/A 03/21/2016   Procedure: XI ROBOTIC ASSISTED LOWER ANTERIOR RESECTION;  Surgeon: Bernarda Ned, MD;  Location: [facility];  Service: General;  Laterality: N/A;    Current Medications: Current Meds  Medication Sig   acetaminophen  (TYLENOL ) 500 MG tablet Take 1,000 mg by mouth every 6 (six) hours as needed for moderate pain (pain score 4-6).   cholecalciferol (VITAMIN D3) 25 MCG (1000 UNIT) tablet Take 1,000 Units by mouth daily.   cyanocobalamin  (VITAMIN B12) 1000 MCG tablet Take 1,000 mcg by mouth daily.   estradiol  (VIVELLE -DOT) 0.1 MG/24HR patch APPLY 1 PATCH TOPICALLY TO THE SKIN 2 TIMES A WEEK   hydrochlorothiazide  (HYDRODIURIL ) 25 MG tablet TAKE 1 TABLET(25 MG) BY MOUTH DAILY   ibuprofen  (ADVIL ) 600 MG tablet TAKE 1 TABLET(600 MG) BY MOUTH EVERY 6 HOURS FOR UP TO 14 DAYS AS NEEDED   OMEPRAZOLE  PO Take by mouth.  Polyethyl Glycol-Propyl Glycol (SYSTANE) 0.4-0.3 % GEL ophthalmic gel Place 1 Application into the right eye daily.   Probiotic Product (ALIGN PO) Take 1 tablet by mouth daily.   SUMAtriptan  (IMITREX ) 50 MG tablet TAKE 1 TABLET BY MOUTH EVERY 2 HOURS AS NEEDED FOR MIGRAINE. MAY REPEAT IN 2 HOURS IF HEADACHE PERSISTS OR RECURS. MAX 2 TABLETS IN 24 HOURS (Patient taking differently: Take 50 mg by mouth every 2 (two) hours as needed for migraine.)   valACYclovir  (VALTREX ) 1000 MG tablet Take 1,000 mg by mouth as needed (outbreak).     Allergies:   Codeine, Hydrocodone, Morphine and codeine, Sucralose, Zolpidem, Zolpidem tartrate, and Zolpidem tartrate   Social History   Socioeconomic History   Marital status: Legally Separated    Spouse name: Not on file   Number of children: 3   Years of  education: Not on file   Highest education level: Not on file  Occupational History   Occupation: data processing manager  Tobacco Use   Smoking status: Never    Passive exposure: Never   Smokeless tobacco: Never  Vaping Use   Vaping status: Never Used  Substance and Sexual Activity   Alcohol use: No    Alcohol/week: 0.0 standard drinks of alcohol   Drug use: No   Sexual activity: Not Currently    Birth control/protection: Abstinence  Other Topics Concern   Not on file  Social History Narrative   No exercise   Social Drivers of Corporate Investment Banker Strain: Not on file  Food Insecurity: Not on file  Transportation Needs: Not on file  Physical Activity: Not on file  Stress: Not on file  Social Connections: Not on file     Family History: The patient's family history includes Alcoholism in her father; Alzheimer's disease in her mother; Breast cancer in her maternal grandmother; COPD in her father; Cancer (age of onset: 45) in her paternal grandmother; Heart attack in her paternal grandfather; Hypertension in her father, paternal grandfather, sister, and sister; Skin cancer in her cousin; Supraventricular tachycardia in her mother. ROS:   Please see the history of present illness.    All 14 point review of systems negative except as described per history of present illness  EKGs/Labs/Other Studies Reviewed:    EKG Interpretation Date/Time:  Monday June 17 2024 16:11:40 EST Ventricular Rate:  75 PR Interval:  144 QRS Duration:  72 QT Interval:  396 QTC Calculation: 442 R Axis:   16  Text Interpretation: Normal sinus rhythm Low voltage QRS When compared with ECG of 27-Feb-2024 14:53, PREVIOUS ECG IS PRESENT Confirmed by Bernie Charleston [phone]) on 06/17/2024 4:22:56 PM    Recent Labs: 04/26/2024: ALT 9; BUN 19; Creatinine, Ser 0.83; Hemoglobin 13.3; Platelets 286.0; Potassium 3.8; Sodium 141; TSH 3.66  Recent Lipid Panel    Component Value Date/Time   CHOL 158  04/26/2024 1104   CHOL 187 05/18/2020 1339   TRIG 153.0 (H) 04/26/2024 1104   HDL 43.50 04/26/2024 1104   HDL 59 05/18/2020 1339   CHOLHDL 4 04/26/2024 1104   VLDL 30.6 04/26/2024 1104   LDLCALC 84 04/26/2024 1104   LDLCALC 117 (H) 05/18/2020 1339    Physical Exam:    VS:  BP (!) 110/90   Pulse 75   Ht 5' 2 (1.575 m)   Wt 220 lb (99.8 kg)   SpO2 98%   BMI 40.24 kg/m     Wt Readings from Last 3 Encounters:  06/17/24 220 lb (99.8 kg)  04/22/24 215 lb (97.5 kg)  04/18/24 215 lb 6.4 oz (97.7 kg)     GEN:  Well nourished, well developed in no acute distress HEENT: Normal NECK: No JVD; No carotid bruits LYMPHATICS: No lymphadenopathy CARDIAC: RRR, no murmurs, no rubs, no gallops RESPIRATORY:  Clear to auscultation without rales, wheezing or rhonchi  ABDOMEN: Soft, non-tender, non-distended MUSCULOSKELETAL:  No edema; No deformity  SKIN: Warm and dry LOWER EXTREMITIES: no swelling NEUROLOGIC:  Alert and oriented x 3 PSYCHIATRIC:  Normal affect   ASSESSMENT:    1. Screening for cardiovascular condition   2. Raynaud's disease without gangrene   3. OSA (obstructive sleep apnea)   4. Rectal cancer (HCC)    PLAN:    In order of problems listed above:  Chest pain only 1 episode since I seen her last time.  Coronary CT angio negative with calcium score 0 continue monitoring. Raynaud's phenomena denies having any. Obstructive sleep apnea followed by antimedicine team. History of rectal cancer.  Noted. Dyslipidemia did review KPN show me LDL 84 HDL 43 good management continue present management   Medication Adjustments/Labs and Tests Ordered: Current medicines are reviewed at length with the patient today.  Concerns regarding medicines are outlined above.  Orders Placed This Encounter  Procedures   EKG 12-Lead   Medication changes: No orders of the defined types were placed in this encounter.   Signed, Lamar DOROTHA Fitch, MD, [facility] 06/17/2024 4:36 PM    [facility]

## 2024-08-05 ENCOUNTER — Ambulatory Visit

## 2024-08-05 DIAGNOSIS — M24572 Contracture, left ankle: Secondary | ICD-10-CM

## 2024-08-05 DIAGNOSIS — M7661 Achilles tendinitis, right leg: Secondary | ICD-10-CM

## 2024-08-05 DIAGNOSIS — M7662 Achilles tendinitis, left leg: Secondary | ICD-10-CM

## 2024-08-05 DIAGNOSIS — M24571 Contracture, right ankle: Secondary | ICD-10-CM

## 2024-08-05 DIAGNOSIS — M722 Plantar fascial fibromatosis: Secondary | ICD-10-CM | POA: Diagnosis not present

## 2024-08-05 NOTE — Patient Instructions (Signed)

## 2024-08-05 NOTE — Progress Notes (Unsigned)
 New powersteps dispensed Talked about stretching No physical therapy yet

## 2024-08-06 ENCOUNTER — Ambulatory Visit: Admitting: Podiatry

## 2024-09-13 ENCOUNTER — Ambulatory Visit: Payer: Self-pay

## 2024-09-13 NOTE — Telephone Encounter (Signed)
 FYI Only or Action Required?: FYI only for provider: ED advised.  Patient was last seen in primary care on 04/18/2024 by Kaitlyn Morgan, Kaitlyn SAUNDERS, DO.  Called Nurse Triage reporting Vomiting and Back Pain.  Symptoms began today.  Interventions attempted: Rest, hydration, or home remedies.  Symptoms are: rapidly worsening.  Triage Disposition: See HCP Within 4 Hours (Or PCP Triage)  Patient/caregiver understands and will follow disposition?: Yes   Message from [place] S sent at 09/13/2024  4:45 PM EST  Reason for Triage: Bad back and abdominal pain, vomiting, Nausea      Reason for Disposition  [1] Vomiting AND [2] abdomen looks much more swollen than usual  Answer Assessment - Initial Assessment Questions Pt called to report abd pain with associated abd tenderness and distention with lower back pain. PT denies having an appetite, pt has tried to sip on fluids today without much success. Pt states last night she had a h/a with facial swelling, took tylenol  and went to sleep. This morning pt woke up with lower back and abdominal pain. Pt states she woke up with chills but there is no heat in the bedroom and only uses a heater so attributed chills to environment not fever. Pt denies any fever symptoms at this time. Pt does have hx of rectal cx with instances of fecal incontinence. Pt did have event yesterday where she did not make it to restroom with BM and was worried this morning about UTI vs. Bladder infection. Pt denies any hematuria, no pain or burning with urination. Pt states she has nausea and has vomited, continued dry heaving. Pt reports emesis was yellow, appearance similar to chicken broth, denies any coffee ground appearance, blood or bile. Based on hx, current symptoms and no improvement with rest today discussed importance of U/S to r/o any inflammation or infection, especially with pt verbalizing fear of bowel obstruction. Discussed pt does still have a gallbladder and appendix  and both symptoms should be evaluated immediately as pt is in [place] and not able to see PCP d/t office closure at this time. Pt agreeable but does not have transportation as her boyfriend is currently at work. Discussed EMS and pt states she will call or have her boyfriend come get her to transport her if he is able to leave work. Encouraged her to contact [facility] if she needed anything further. She voiced appreciation.       1. VOMITING SEVERITY: How many times have you vomited in the past 24 hours?      Pt reports dry heaving and yellow appearing emesis; unable to provide number of times she has vomited but stated she is nauseated at this time   2. ONSET: When did the vomiting begin?      This morning   3. FLUIDS: What fluids or food have you vomited up today? Have you been able to keep any fluids down?     Pt states she tried to sip on a coke to see if abdominal pain was related to flatulence but unable to beltch or pass gas at this time.   4. ABDOMEN PAIN: Are your having any abdomen pain? If Yes : How bad is it and what does it feel like? (e.g., crampy, dull, intermittent, constant)      Yes, worse than she has experienced in the past. Abdominal tenderness to touch, comes and goes, 8/10  5. DIARRHEA: Is there any diarrhea? If Yes, ask: How many times today?      No;  pt has hx of rectal cx so she does have stool incontinence at times. BM yesterday  6. CONTACTS: Is there anyone else in the family with the same symptoms?      No   7. CAUSE: What do you think is causing your vomiting?     Unsure; pt worried about bowel obstruction   8. HYDRATION STATUS: Any signs of dehydration? (e.g., dry mouth [not only dry lips], too weak to stand) When did you last urinate?     No   9. OTHER SYMPTOMS: Do you have any other symptoms? (e.g., fever, headache, vertigo, vomiting blood or coffee grounds, recent head injury)     Abdominal and lower back pain, n/v, dry heaving,  chills  Protocols used: Vomiting-A-AH
